# Patient Record
Sex: Female | Born: 1980
Health system: Southern US, Community
[De-identification: ages and names within clinical notes are randomized; demographics above are authoritative.]

## PROBLEM LIST (undated history)

## (undated) DIAGNOSIS — Z9221 Personal history of antineoplastic chemotherapy: Secondary | ICD-10-CM

## (undated) DIAGNOSIS — I10 Essential (primary) hypertension: Secondary | ICD-10-CM

## (undated) DIAGNOSIS — M62830 Muscle spasm of back: Secondary | ICD-10-CM

## (undated) DIAGNOSIS — G43909 Migraine, unspecified, not intractable, without status migrainosus: Secondary | ICD-10-CM

## (undated) DIAGNOSIS — T4145XA Adverse effect of unspecified anesthetic, initial encounter: Secondary | ICD-10-CM

## (undated) DIAGNOSIS — Z8571 Personal history of Hodgkin lymphoma: Secondary | ICD-10-CM

## (undated) DIAGNOSIS — D72819 Decreased white blood cell count, unspecified: Secondary | ICD-10-CM

## (undated) DIAGNOSIS — L309 Dermatitis, unspecified: Secondary | ICD-10-CM

## (undated) DIAGNOSIS — E039 Hypothyroidism, unspecified: Secondary | ICD-10-CM

## (undated) DIAGNOSIS — T8859XA Other complications of anesthesia, initial encounter: Secondary | ICD-10-CM

## (undated) DIAGNOSIS — R05 Cough: Secondary | ICD-10-CM

## (undated) DIAGNOSIS — C8190 Hodgkin lymphoma, unspecified, unspecified site: Secondary | ICD-10-CM

## (undated) DIAGNOSIS — E119 Type 2 diabetes mellitus without complications: Secondary | ICD-10-CM

## (undated) HISTORY — DX: Personal history of antineoplastic chemotherapy: Z92.21

## (undated) HISTORY — DX: Hodgkin lymphoma, unspecified, unspecified site: C81.90

## (undated) HISTORY — DX: Migraine, unspecified, not intractable, without status migrainosus: G43.909

## (undated) HISTORY — PX: DILATION AND CURETTAGE OF UTERUS: SHX78

## (undated) HISTORY — DX: Type 2 diabetes mellitus without complications: E11.9

## (undated) HISTORY — DX: Muscle spasm of back: M62.830

## (undated) HISTORY — DX: Personal history of Hodgkin lymphoma: Z85.71

## (undated) HISTORY — DX: Essential (primary) hypertension: I10

---
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
## 2002-06-05 ENCOUNTER — Emergency Department (HOSPITAL_COMMUNITY): Admission: EM | Admit: 2002-06-05 | Discharge: 2002-06-05 | Payer: Self-pay | Admitting: Emergency Medicine

## 2002-06-05 ENCOUNTER — Encounter: Payer: Self-pay | Admitting: Emergency Medicine

## 2002-08-12 ENCOUNTER — Encounter: Payer: Self-pay | Admitting: Obstetrics and Gynecology

## 2002-08-12 ENCOUNTER — Inpatient Hospital Stay (HOSPITAL_COMMUNITY): Admission: AD | Admit: 2002-08-12 | Discharge: 2002-08-12 | Payer: Self-pay | Admitting: *Deleted

## 2002-08-23 ENCOUNTER — Ambulatory Visit (HOSPITAL_COMMUNITY): Admission: RE | Admit: 2002-08-23 | Discharge: 2002-08-23 | Payer: Self-pay | Admitting: *Deleted

## 2002-08-23 ENCOUNTER — Encounter: Payer: Self-pay | Admitting: *Deleted

## 2002-08-23 ENCOUNTER — Inpatient Hospital Stay (HOSPITAL_COMMUNITY): Admission: AD | Admit: 2002-08-23 | Discharge: 2002-08-23 | Payer: Self-pay | Admitting: Obstetrics

## 2002-08-25 ENCOUNTER — Inpatient Hospital Stay (HOSPITAL_COMMUNITY): Admission: AD | Admit: 2002-08-25 | Discharge: 2002-08-25 | Payer: Self-pay | Admitting: Obstetrics

## 2002-08-27 ENCOUNTER — Inpatient Hospital Stay (HOSPITAL_COMMUNITY): Admission: AD | Admit: 2002-08-27 | Discharge: 2002-08-27 | Payer: Self-pay | Admitting: Obstetrics

## 2002-08-30 ENCOUNTER — Ambulatory Visit (HOSPITAL_COMMUNITY): Admission: RE | Admit: 2002-08-30 | Discharge: 2002-08-30 | Payer: Self-pay | Admitting: *Deleted

## 2002-08-30 ENCOUNTER — Encounter: Payer: Self-pay | Admitting: Obstetrics

## 2002-09-06 ENCOUNTER — Ambulatory Visit (HOSPITAL_COMMUNITY): Admission: RE | Admit: 2002-09-06 | Discharge: 2002-09-06 | Payer: Self-pay | Admitting: Obstetrics

## 2002-09-06 ENCOUNTER — Encounter: Payer: Self-pay | Admitting: Obstetrics

## 2002-09-10 ENCOUNTER — Ambulatory Visit (HOSPITAL_COMMUNITY): Admission: RE | Admit: 2002-09-10 | Discharge: 2002-09-10 | Payer: Self-pay | Admitting: Obstetrics

## 2002-09-10 ENCOUNTER — Encounter (INDEPENDENT_AMBULATORY_CARE_PROVIDER_SITE_OTHER): Payer: Self-pay | Admitting: Specialist

## 2002-09-11 ENCOUNTER — Inpatient Hospital Stay (HOSPITAL_COMMUNITY): Admission: AD | Admit: 2002-09-11 | Discharge: 2002-09-12 | Payer: Self-pay | Admitting: Obstetrics

## 2002-09-11 ENCOUNTER — Encounter: Payer: Self-pay | Admitting: Obstetrics

## 2002-09-12 ENCOUNTER — Inpatient Hospital Stay (HOSPITAL_COMMUNITY): Admission: AD | Admit: 2002-09-12 | Discharge: 2002-09-15 | Payer: Self-pay | Admitting: Obstetrics

## 2003-04-07 ENCOUNTER — Emergency Department (HOSPITAL_COMMUNITY): Admission: EM | Admit: 2003-04-07 | Discharge: 2003-04-07 | Payer: Self-pay | Admitting: Emergency Medicine

## 2003-04-07 ENCOUNTER — Emergency Department (HOSPITAL_COMMUNITY): Admission: EM | Admit: 2003-04-07 | Discharge: 2003-04-08 | Payer: Self-pay | Admitting: Emergency Medicine

## 2003-06-10 ENCOUNTER — Encounter: Payer: Self-pay | Admitting: Emergency Medicine

## 2003-06-10 ENCOUNTER — Emergency Department (HOSPITAL_COMMUNITY): Admission: EM | Admit: 2003-06-10 | Discharge: 2003-06-10 | Payer: Self-pay | Admitting: Emergency Medicine

## 2003-09-29 ENCOUNTER — Emergency Department (HOSPITAL_COMMUNITY): Admission: AD | Admit: 2003-09-29 | Discharge: 2003-09-29 | Payer: Self-pay | Admitting: Family Medicine

## 2003-10-08 ENCOUNTER — Emergency Department (HOSPITAL_COMMUNITY): Admission: EM | Admit: 2003-10-08 | Discharge: 2003-10-08 | Payer: Self-pay | Admitting: Emergency Medicine

## 2003-12-08 ENCOUNTER — Emergency Department (HOSPITAL_COMMUNITY): Admission: EM | Admit: 2003-12-08 | Discharge: 2003-12-08 | Payer: Self-pay | Admitting: Emergency Medicine

## 2004-02-18 ENCOUNTER — Inpatient Hospital Stay (HOSPITAL_COMMUNITY): Admission: AD | Admit: 2004-02-18 | Discharge: 2004-02-18 | Payer: Self-pay | Admitting: Obstetrics & Gynecology

## 2005-04-11 ENCOUNTER — Emergency Department (HOSPITAL_COMMUNITY): Admission: EM | Admit: 2005-04-11 | Discharge: 2005-04-11 | Payer: Self-pay | Admitting: Emergency Medicine

## 2006-06-06 ENCOUNTER — Emergency Department (HOSPITAL_COMMUNITY): Admission: EM | Admit: 2006-06-06 | Discharge: 2006-06-06 | Payer: Self-pay | Admitting: Emergency Medicine

## 2006-07-17 ENCOUNTER — Inpatient Hospital Stay (HOSPITAL_COMMUNITY): Admission: AD | Admit: 2006-07-17 | Discharge: 2006-07-17 | Payer: Self-pay | Admitting: Gynecology

## 2006-07-18 ENCOUNTER — Inpatient Hospital Stay (HOSPITAL_COMMUNITY): Admission: AD | Admit: 2006-07-18 | Discharge: 2006-07-18 | Payer: Self-pay | Admitting: Family Medicine

## 2006-09-22 ENCOUNTER — Other Ambulatory Visit: Admission: RE | Admit: 2006-09-22 | Discharge: 2006-09-22 | Payer: Self-pay | Admitting: Obstetrics and Gynecology

## 2006-09-27 ENCOUNTER — Ambulatory Visit (HOSPITAL_COMMUNITY): Admission: RE | Admit: 2006-09-27 | Discharge: 2006-09-27 | Payer: Self-pay | Admitting: Obstetrics and Gynecology

## 2006-10-06 ENCOUNTER — Ambulatory Visit (HOSPITAL_COMMUNITY): Admission: RE | Admit: 2006-10-06 | Discharge: 2006-10-06 | Payer: Self-pay | Admitting: Obstetrics and Gynecology

## 2006-10-13 ENCOUNTER — Emergency Department (HOSPITAL_COMMUNITY): Admission: EM | Admit: 2006-10-13 | Discharge: 2006-10-13 | Payer: Self-pay | Admitting: Emergency Medicine

## 2007-02-19 ENCOUNTER — Emergency Department (HOSPITAL_COMMUNITY): Admission: EM | Admit: 2007-02-19 | Discharge: 2007-02-19 | Payer: Self-pay | Admitting: Emergency Medicine

## 2007-03-13 ENCOUNTER — Inpatient Hospital Stay (HOSPITAL_COMMUNITY): Admission: AD | Admit: 2007-03-13 | Discharge: 2007-03-13 | Payer: Self-pay | Admitting: Obstetrics and Gynecology

## 2007-05-16 ENCOUNTER — Inpatient Hospital Stay (HOSPITAL_COMMUNITY): Admission: AD | Admit: 2007-05-16 | Discharge: 2007-05-16 | Payer: Self-pay | Admitting: Obstetrics and Gynecology

## 2007-06-20 ENCOUNTER — Inpatient Hospital Stay (HOSPITAL_COMMUNITY): Admission: AD | Admit: 2007-06-20 | Discharge: 2007-06-20 | Payer: Self-pay | Admitting: Obstetrics and Gynecology

## 2007-10-08 ENCOUNTER — Observation Stay (HOSPITAL_COMMUNITY): Admission: AD | Admit: 2007-10-08 | Discharge: 2007-10-09 | Payer: Self-pay | Admitting: Obstetrics and Gynecology

## 2007-10-11 ENCOUNTER — Inpatient Hospital Stay (HOSPITAL_COMMUNITY): Admission: AD | Admit: 2007-10-11 | Discharge: 2007-10-17 | Payer: Self-pay | Admitting: Obstetrics and Gynecology

## 2007-10-12 ENCOUNTER — Encounter (INDEPENDENT_AMBULATORY_CARE_PROVIDER_SITE_OTHER): Payer: Self-pay | Admitting: Obstetrics and Gynecology

## 2009-02-09 ENCOUNTER — Emergency Department (HOSPITAL_COMMUNITY): Admission: EM | Admit: 2009-02-09 | Discharge: 2009-02-09 | Payer: Self-pay | Admitting: Emergency Medicine

## 2009-06-03 ENCOUNTER — Inpatient Hospital Stay (HOSPITAL_COMMUNITY): Admission: AD | Admit: 2009-06-03 | Discharge: 2009-06-04 | Payer: Self-pay | Admitting: Obstetrics and Gynecology

## 2009-11-18 ENCOUNTER — Emergency Department (HOSPITAL_COMMUNITY): Admission: EM | Admit: 2009-11-18 | Discharge: 2009-11-18 | Payer: Self-pay | Admitting: Emergency Medicine

## 2011-02-28 LAB — URINALYSIS, ROUTINE W REFLEX MICROSCOPIC
Bilirubin Urine: NEGATIVE
Hgb urine dipstick: NEGATIVE
Ketones, ur: NEGATIVE mg/dL
Specific Gravity, Urine: 1.026 (ref 1.005–1.030)
pH: 6 (ref 5.0–8.0)

## 2011-02-28 LAB — CBC
HCT: 39.9 % (ref 36.0–46.0)
Hemoglobin: 13.4 g/dL (ref 12.0–15.0)
MCV: 92.2 fL (ref 78.0–100.0)
Platelets: 246 10*3/uL (ref 150–400)
RDW: 12.9 % (ref 11.5–15.5)

## 2011-02-28 LAB — POCT I-STAT, CHEM 8
BUN: 13 mg/dL (ref 6–23)
Calcium, Ion: 1.07 mmol/L — ABNORMAL LOW (ref 1.12–1.32)
Chloride: 102 mEq/L (ref 96–112)
Creatinine, Ser: 0.7 mg/dL (ref 0.4–1.2)
Glucose, Bld: 130 mg/dL — ABNORMAL HIGH (ref 70–99)
HCT: 42 % (ref 36.0–46.0)
Potassium: 4.3 mEq/L (ref 3.5–5.1)

## 2011-02-28 LAB — DIFFERENTIAL
Basophils Absolute: 0 10*3/uL (ref 0.0–0.1)
Eosinophils Absolute: 0.1 10*3/uL (ref 0.0–0.7)
Eosinophils Relative: 1 % (ref 0–5)
Lymphocytes Relative: 13 % (ref 12–46)
Monocytes Absolute: 0.4 10*3/uL (ref 0.1–1.0)

## 2011-02-28 LAB — PREGNANCY, URINE: Preg Test, Ur: NEGATIVE

## 2011-03-06 LAB — CBC
HCT: 38.2 % (ref 36.0–46.0)
Hemoglobin: 13.3 g/dL (ref 12.0–15.0)
MCHC: 34.8 g/dL (ref 30.0–36.0)
MCV: 93.9 fL (ref 78.0–100.0)
RBC: 4.06 MIL/uL (ref 3.87–5.11)
WBC: 5.2 10*3/uL (ref 4.0–10.5)

## 2011-03-06 LAB — URINALYSIS, ROUTINE W REFLEX MICROSCOPIC
Bilirubin Urine: NEGATIVE
Glucose, UA: NEGATIVE mg/dL
Specific Gravity, Urine: 1.02 (ref 1.005–1.030)

## 2011-03-06 LAB — WET PREP, GENITAL
Trich, Wet Prep: NONE SEEN
Yeast Wet Prep HPF POC: NONE SEEN

## 2011-03-06 LAB — POCT PREGNANCY, URINE: Preg Test, Ur: NEGATIVE

## 2011-03-06 LAB — URINE MICROSCOPIC-ADD ON

## 2011-03-06 LAB — COMPREHENSIVE METABOLIC PANEL
CO2: 28 mEq/L (ref 19–32)
Calcium: 9.2 mg/dL (ref 8.4–10.5)
Chloride: 102 mEq/L (ref 96–112)
Creatinine, Ser: 0.81 mg/dL (ref 0.4–1.2)
GFR calc non Af Amer: >60 mL/min (ref >60)
Glucose, Bld: 90 mg/dL (ref 70–99)
Total Bilirubin: 0.5 mg/dL (ref 0.3–1.2)

## 2011-03-06 LAB — GC/CHLAMYDIA PROBE AMP, GENITAL: GC Probe Amp, Genital: NEGATIVE

## 2011-04-12 NOTE — Discharge Summary (Signed)
[name], [name]           ACCOUNT NO.:  [id number]   MEDICAL RECORD NO.:  [id number]          PATIENT TYPE:  INP   LOCATION:  9309                          [facility]ILITY[facility]  [facility]   PHYSICIAN:  Charles A. Delcambre, MDDATE OF BIRTH:  [birth year]   DATE OF ADMISSION:  10/11/2007  DATE OF DISCHARGE:  10/17/2007                               DISCHARGE SUMMARY   DISCHARGE DIAGNOSES:  1. Chronic hypertension augmented with pregnancy.  2. Failed induction versus cephalopelvic disproportion.  3. Fetal intolerance of labor.  4. Gestational diabetes, insulin-requiring.   PROCEDURE:  Primary low transverse cesarean section.   Baby was a vigorous female, Apgars 5 and 7, 4 pounds 6 ounces.  Placenta  to pathology.  Path pending at this time.  History and physical is  dictated and on the chart.   LABORATORY:  Last CMET consistent with normal SGOT and PT.  Uric acid  was 6.4.  CBC was 14.2 white count, hemoglobin 9.6, hematocrit 28.4,  platelets 223.   DISPOSITION:  The patient was discharged home to follow-up in the office  in 2 weeks.  She was given convalescence and instructions to notify of  any temperature greater than 100 degrees, erythema or drainage about the  wound, increased pain or bleeding.  She will not lift more than about 20-  25 pounds for 4 weeks.  She is precautioned regarding PIH symptoms as  well as routine symptoms for postoperative C-section.  No driving for 2  weeks.  Shower okay for 2 weeks.  Diet as tolerated.  She was given prescriptions of Procardia XL 60 mg once a day, Percocet  5/325 one to two p.o. q.4h. p.r.n. #40, Motrin 800 mg one p.o. q.8h.  p.r.n. #30 refill times one and Tandem one p.o. daily #30 refill times  one.   HOSPITAL COURSE:  The patient was admitted and was to undergo Cervidil  induction was contracting too much overnight.  She never got past  fingertip and then developed periods of successive 4-5 light  decelerations.  Reactivity did remain.   Cervix was high and closed.  Blood pressure 150/87.  Aldomet had just been given.  With late decelerations noted frequently  recommended we proceed with cesarean section and this was done without  difficulty.  Placenta was taken to pathology.  Findings were noted above. The baby did end up going to the NICU.  Not  clear what the reasoning was but poor feeding had been worked on.  Antibiotics were given briefly and the baby has been stable.  It is  unclear when the baby will go home with the patient getting different  answers when she asks.   On postop day #1 she was given clear liquids and tolerated these.  Laboratories are noted above.  In the morning creatinine 27.6.  Fasting  blood sugar was 91.  The 9:00 p.m. blood sugar the evening before was  111.  She was continued on Aldomet and Labetalol was 200 mg b.i.d.  On  postop day #2 we discontinued the Aldomet and started Procardia XL 30  mg.  Decreasing the labetalol to 100 mg b.i.d.  On postop day 3 she  continued to do well.  The CBC with platelets 223, hematocrit 28.4.  She  was once again had the Procardia increased to 60 mg and labetalol was  discontinued.  On postop day #4 she continued to have blood pressures in  the 150s/90s and we elected to watch  her one more day on Procardia XL 60 mg.  On postop day #5 blood  pressures were 148/91.  Had been 130s-150s/80's-90s in the last 24  hours.  Incision was intact.  She was therefore discharged home after  staples were removed on Procardia XL 60 mg once a day, otherwise as  noted above.      Charles A. Sydnee Cabal, MD  Electronically Signed     CAD/MEDQ  D:  10/17/2007  T:  10/17/2007  Job:  [id number]

## 2011-04-12 NOTE — H&P (Signed)
[name], [name]           ACCOUNT NO.:  [id number]   MEDICAL RECORD NO.:  [id number]          PATIENT TYPE:  OBV   LOCATION:  9154                          [facility]  [facility]   PHYSICIAN:  Charles A. Delcambre, MDDATE OF BIRTH:  1981/03/01   DATE OF ADMISSION:  10/08/2007  DATE OF DISCHARGE:                              HISTORY & PHYSICAL   This patient is 34 weeks and 5 days, with pregnancy complicated by  chronic hypertension and gestational diabetes requiring insulin.  She is  on 20 units of NPH q. morning and sugars have been pretty well  controlled in the last several days. Fasting's have been in the 70 to 80  range.  Two hour postprandial's  110, occasionally 120.  After breakfast  low teens to 120's for lunch and same for the 9 p.m. or after dinner,  low teens and one 126.  She is on two antihypertensive medications that  are marginally effective with blood pressure as noted below today.  She  is admitted because of biophysical profile 6 out of 10, barely making 6  with breathing after about 30 minutes.  She notes active fetal movement  a great deal at night and then later in the day but not in the morning  and this BPP was done in the morning.  She has a very reactive NST,  counter to the BPP results. A 24 hour urine has been done and on  November 4 was 126.4.   PAST MEDICAL HISTORY:  Chronic hypertension.   PAST SURGICAL HISTORY:  DNE x1.  SAB x1.   MEDICATIONS:  1. NPH Humulin 20 units q.a.m.  2. Labetalol 200 mg b.i.d.  3. Aldomet 500 mg x4 daily.   ALLERGIES:  PENICILLIN.   A 24 hour urine protein on July 26, 2007 was 175 mg so she is somewhat  improved at this time.  AFI today was 11.7.   OB LABS:  Blood type A positive, Rh antibody screen negative, sickle  cell trait negative, VDRL nonreactive, rubella immune, hepatitis B  surface antigen negative, HIV nonreactive, TSH normal. Urinalysis  negative.  Pap negative in October, 2007.  GC/Chlamydia negative  on Apr 20, 2007.  Cystic fibrosis declined.  Triple screen declined, first  trimester screen declined. One hour Glucola 199, 3 hour Glucola fasting  90, 1 hour 200, 2 hour 229, 3 hour 148. She saw Ina Kick and was  started on 15 of NPH in the morning per her instructions.  Hemoglobin at  28 weeks was 11.1. RPR negative at 28 weeks.   PHYSICAL EXAMINATION:  GENERAL:  Alert and oriented x3.  Denies  pregnancy induced hypertension symptoms or any symptoms of blood  pressure elevation. Blood pressure 158/92, trace protein in the urine,  weight 249 pounds. [facility] November 14, 2007 by LMP, confirmed with 6-weeks  and 2-day ultrasounds putting her at  34 weeks and 5 days.  HEENT EXAM:  Grossly within normal limits.  NECK:  Supple.  BREASTS:  No masses, tenderness, discharge.  No lumps.  ABDOMEN:  Gravid, fundal height 35.  LUNGS:  Clear bilaterally.  HEART:  Regular rate and rhythm, 2/6 systolic ejection murmur in the  left sternal border.  PELVIC EXAM:  Cervix is not checked. She gives no history of rupture of  membranes, leaking or discharge.  EXTREMITIES:  Minimal edema bilaterally.   ASSESSMENT:  1. Chronic hypertension.  2. Gestational diabetes, insulin requiring.  3. Biophysical 6 out of 10 today at the office.   PLAN:  Admit with continuous monitoring overnight.  Tocolysis if needed.  General diet as far as a regular AMA type of diet.  PIH panel. C-MET and  CBC to be drawn.  Group B strep will be done. Bed rest with bathroom  privileges.  Continue 20 units NPH q.a.m., fasting 2 hour postprandial's  9 p.m. glucose's. Continue Labetalol 200 mg b.i.d. and Aldomet 500 mg x4  daily. Biophysical to be repeated in the morning after breakfast and we  will go on from there.      Charles A. Sydnee Cabal, MD  Electronically Signed     CAD/MEDQ  D:  10/08/2007  T:  10/08/2007  Job:  [id number]

## 2011-04-12 NOTE — H&P (Signed)
[name], [name]           ACCOUNT NO.:  [id number]   MEDICAL RECORD NO.:  [id number]          PATIENT TYPE:  INP   LOCATION:  9172                          [facility]   PHYSICIAN:  Charles A. Delcambre, MDDATE OF BIRTH:  [birth year]   DATE OF ADMISSION:  10/11/2007  DATE OF DISCHARGE:                              HISTORY & PHYSICAL   This patient is admitted now at 35 weeks, 1 day, to undergo cervical  induction secondary to diabetes and chronic hypertension.  But, on  monitoring today, she did have two late decelerations, possibly a third  in about 1 1/2 hours of monitoring.  She had a biophysical profile of  10/10 yesterday, but the day before biophysical profile was 6/10.  I  feel that it is time to go ahead and get this baby out, and she  understands the risks of prematurity.   PAST MEDICAL HISTORY:  Chronic hypertension.   PAST SURGICAL HISTORY:  D and E x1.   MEDICATION:  1. NPH  Humulin 20 units q.a.m.  2. Labetalol 200 mg b.i.d.  3. Aldomet 500 mg q.i.d.   ALLERGIES:  PENICILLIN.   PRENATAL LABS:  Blood type A+.  Antibody screen negative.  Sickle cell  trait negative.  VDRL nonreactive.  Rubella immune, hepatitis C surface  antigen negative.  HIV nonreactive.  TSH normal.  Urinalysis negative.  PAP negative October 2007.  GC Chlamydia in May negative.  Cystic  fibrosis  declined. Triple screen declined.  First trimester screen did  decline.  One hour Glucola 199.  Three hour Glucola fasting 90, one hour  200, two hour 229, three hour 148.  She saw Ina Kick and was  started on 15 of NPH units of NPH in the morning per my instructions.  Hemoglobin at 28 weeks was 11.1.  She had Group B Strep drawn done two  days ago at the hospital, results pending.  Denies any PIH sx of  pregnancy, hypertension symptom, scotomata or right upper quadrant pain,  blurred vision.  No symptoms of blood pressure elevation.  Blood  pressure today 140/92.  Trace protein in the  urine.  A 24-hour urine  showed 175 mg of protein.   PHYSICAL EXAMINATION:  GENERAL:  Alert and oriented x3.  VITAL SIGNS:  Blood pressure 140/92.  HEENT:  Exam grossly normal.  NECK:  Supple.  No thyromegaly.  LUNGS:  Clear bilaterally.  HEART:  Regular rate and rhythm.  A 2/6 systolic ejection murmur at the  left sternal border.  ABDOMEN:  Gravid.  Fundal height 36.  BREAST:  No masses, no tenderness, no discharge.  SKIN:  There are no changes.  PELVIC EXAM:  Cervix is posterior -2, 50-70% effaced, soft and less than  1 cm.  EXTREMITIES:  Minimal edema bilaterally.   ASSESSMENT:  1. Chronic hypertension.  2. Gestational diabetes, insulin requiring.  3. Suspicious NST.   PLAN:  1. Admit for cervical induction.  2. ADA diet tonight if strip looks okay.  3. PIH labs were drawn two days ago and were negative.  4. Check Group B Strep results.  5.  Treat as indicated.  6. She takes her insulin in the morning.  We will hold off in the      morning.  7. Check morning fasting sugar, and then she how active she is going      to be, and possibly give her some regular insulin if needed, if not      in active labor.  In active labor, if the morning sugars are      elevated, we will go ahead and put her on the Glucomander.  8. Continue Labetalol 200 mg b.i.d.  9. Aldomet 500 mg four times daily.      Charles A. Sydnee Cabal, MD  Electronically Signed     CAD/MEDQ  D:  10/11/2007  T:  10/12/2007  Job:  [id number]

## 2011-04-12 NOTE — Op Note (Signed)
NAMESALIHAH, PECKHAM           ACCOUNT NO.:  [id number]   MEDICAL RECORD NO.:  [id number]          PATIENT TYPE:  INP   LOCATION:  9160                          [facility]   PHYSICIAN:  Charles A. Delcambre, MDDATE OF BIRTH:  1981-03-04   DATE OF PROCEDURE:  10/11/2007  DATE OF DISCHARGE:                               OPERATIVE REPORT   This patient did develop fetal intolerance of labor with any Pitocin  dosage, was contracting too much for Cervidil to be placed, and she did  not have recurrent decelerations, but developed frequent late  decelerations. For this reason, she was counseled to go to operative  delivery.  She gave informed consent as noted in the chart.  She was  taken to the operating room and placed in the supine position.  Spinal  was given and was adequate.  A sterile prep and drape was undertaken.  A  Pfannenstiel incision was made with the knife and carried down to the  fascia.  The fascia was incised with a knife and Mayo scissors.  The  rectus sheath was released superiorly and inferiorly.  The rectus  muscles were sharply dissected in the midline.  The peritoneum was  entered with Metzenbaum scissors without damage to bowel, bladder or  vascular structures.  The peritoneum was stretched to accommodate an  Teacher, early years/pre with no bowel under the retractor.  The retractor was  cinched down.  The vesicouterine peritoneum was incised with Metzenbaum  scissors.  Blunt dissection was used develop a bladder flap.  A lower  uterine segment transverse incision was made with the knife and carried  down to an amniotomy without damage to the infant.  Vertical traction  was used to extend the incision.  A hand was inserted, the occiput was  lifted up to the incision site.  Fundal pressure was applied by the  operator's assistant and the infant was delivered without difficulty.  The body cord was reduced.  The infant was vigorous, cut free, cord gas  was sent to the  lab.  The placenta was manually expressed and given to  the cord blood team to go to pathology.  Internal surface of the uterus  was wiped with a moistened lap. The uterus was then closed in two  layers, first a #1 chromic running locking, second #1 chromic running  imbricating over, one figure-of-eight suture #1 chromic was used to  achieve hemostasis.  Bladder flap hemostasis was good. Subfascial  hemostasis was good.  The fascia was closed with #1 Vicryl.  Subcutaneous irrigation was carried out.  Minor electrocautery was  obtained.  Sterile skin clips were then applied to close the skin.  A  sterile dressing was applied.  The patient tolerated the procedure well  and will go back to labor and delivery because AICU bed is not  available. Will monitor urine output and watch for elevation of blood  pressure.      Charles A. Sydnee Cabal, MD  Electronically Signed     CAD/MEDQ  D:  10/12/2007  T:  10/13/2007  Job:  [id number]

## 2011-04-15 NOTE — Op Note (Signed)
   [name], PASCAL                     ACCOUNT NO.:  [id number]   MEDICAL RECORD NO.:  [id number]                   PATIENT TYPE:  AMB   LOCATION:  SDC                                  [facility]   PHYSICIAN:  Kathreen Cosier, M.D.           DATE OF BIRTH:  10-16-1981   DATE OF PROCEDURE:  09/10/2002  DATE OF DISCHARGE:                                 OPERATIVE REPORT   PREOPERATIVE DIAGNOSES:  Intrauterine fetal demise 6-8 weeks.   ANESTHESIA:  MAC.   PROCEDURE:  The patient placed in lithotomy position.  Perineum and vagina  prepped and draped.  Bladder emptied with straight catheter.  Uterus 6-8  weeks size.  Weighted speculum placed in vagina.  Cervix injected at 3, 9,  12 o'clock with 1% Xylocaine for a total of 8 cc.  The anterior lip of the  cervix grasped with tenaculum.  Endometrial cavity sounded 9 cm.  Cervix  dilated number 27 Pratt and a number 8 suction used to aspirate the uterine  contents.  The cavity was curetted until clean.  The patient tolerated  procedure well.  Taken to recovery room in good condition.                                               Kathreen Cosier, M.D.    BAM/MEDQ  D:  09/10/2002  T:  09/10/2002  Job:  [id number]

## 2011-04-15 NOTE — H&P (Signed)
[name], [name]           ACCOUNT NO.:  [id number]   MEDICAL RECORD NO.:  [id number]          PATIENT TYPE:  AMB   LOCATION:  SDC                           [facility]   PHYSICIAN:  Charles A. Delcambre, MDDATE OF BIRTH:  Jun 05, 1981   DATE OF ADMISSION:  09/28/2006  DATE OF DISCHARGE:                                HISTORY & PHYSICAL   CHIEF COMPLAINT:  An 8 week and 2 day fetal demise.   HISTORY OF PRESENT ILLNESS:  A 30 year old, para 0-0-1-0, previous  spontaneous abortion with D&C done, complicated by 4 days afterwards being  hospitalized for fever and admitted the evening of D&C.  Unclear etiology of  the fever, not having a repeat a D&C and not having any other surgical  intervention.  This patient was unaware that she was pregnant.  Has history  of only 2 periods in the last year, is amenorrheic, and upon larger workup  was noted to be pregnant last week.  Ultrasound today to determine  gestational age yielded 8 week 2 day gestation but, unfortunately, fetal  demise.  She has been counseled for spontaneous miscarriage option, elective  management versus dilation and evacuation.  She elects dilation and  evacuation, and this is scheduled for September 28, 2006.  She gives informed  consents and accepts risks of infection, bleeding, bowel or bladder damage,  blood product risk including hepatitis and HIV exposure, retained products  with second D&C, gross anesthesia risks as well as uterine perforation risk.  All questions were answered.  She gives informed consent in this regard, and  we will proceed as outlined below.   PAST MEDICAL HISTORY:  Hypertension.   PAST SURGICAL HISTORY:  D&C.   MEDICATIONS:  None.   ALLERGIES:  PENICILLIN, unknown reaction.   SOCIAL HISTORY:  Denies tobacco, ethanol, drug use, or STD exposure in the  past.  She is not married but sexually active, no monogamously and does use  condoms over time now after gonorrhea was diagnosed  several months ago.   FAMILY HISTORY:  No major illnesses other than mother having hypertension  and obesity, grandmother with hypertension as well. Denies family history of  breast, uterus, ovary, cervix, colon cancer, lymphoma, coronary artery  disease, stroke, diabetes.   REVIEW OF SYSTEMS:  She has problems with only 2 periods per year.  Denies  fever, chills, new rashes, lesion, headaches, dizziness, seasonal allergies,  chest pain, shortness of breath, wheezing, diarrhea, constipation, bleeding,  melena, hematochezia, urgency, frequency, dysuria, incontinence, hematuria,  or emotional changes.   PHYSICAL EXAMINATION:  GENERAL: Alert and oriented x3, no distress.  VITAL SIGNS:  Blood pressure 120/80, heart rate 88, respirations 16. Weight  227 pounds.  HEENT:  Exam grossly within normal limits.  NECK: Supple without thyromegaly or adenopathy.  LUNGS: Clear bilaterally.  HEART: Regular rate and rhythm without murmur, rub, or gallop.  BREASTS: No masses, tenderness, discharge, skin changes bilaterally.  ABDOMEN:  Moderate obesity is present.  Soft, nontender, nondistended. No  hepatosplenomegaly or other masses noted.  No hernia.  PELVIC: Normal external female genitalia.  Bartholin, urethral, Skene  normal.  Vault without discharge or lesions.  Urethral meatus normal.  Urethra normal.  Bladder normal.  Vagina normal.  Nulliparous cervix is  noted.  No cervical motion tenderness is present.  Uterus somewhat difficult  to palpate secondary to patient's body habitus, not enlarged, nontender.  Adnexa nontender without masses bilaterally.  I do not directly palpate the  ovaries secondary to patient's body habitus.   ASSESSMENT:  Fetal demise at 8 weeks and 2 days.   PLAN:  1. Dilation and evacuation as noted above.  2. Preoperative CBC and type and Rh.  3. N.P.O. past midnight.   All questions were answered.  She gives informed consent.  We will proceed  as outlined.       Charles A. Sydnee Cabal, MD  Electronically Signed     CAD/MEDQ  D:  09/27/2006  T:  09/27/2006  Job:  [id number]

## 2011-04-15 NOTE — H&P (Signed)
NAMEELAURA, CALIX           ACCOUNT NO.:  [id number]   MEDICAL RECORD NO.:  [id number]          PATIENT TYPE:  OUT   LOCATION:  ULT                           [facility]   PHYSICIAN:  Charles A. Delcambre, MDDATE OF BIRTH:  [birth year]   DATE OF ADMISSION:  09/27/2006  DATE OF DISCHARGE:                                HISTORY & PHYSICAL   ADDENDUM:  This patient was to be admitted on September 28, 2006, but this has  been rescheduled to October 05, 2006 secondary to a gonorrhea infection that  was discovered on September 27, 2006.  She has been treated with Rocephin 125  mg IM on September 28, 2006, and we will delay D&E for one week secondary to  this infection.  We will proceed on with D&E as dictated on previous history  and physical, and she will remain n.p.o. past midnight with surgery  scheduled for 2 p.m., as I understand it, on October 05, 2006.      Charles A. Sydnee Cabal, MD  Electronically Signed     CAD/MEDQ  D:  09/28/2006  T:  09/28/2006  Job:  [id number]

## 2011-04-15 NOTE — Discharge Summary (Signed)
   NAMEANGELIC, [name]                     ACCOUNT NO.:  [id number]   MEDICAL RECORD NO.:  [id number]                   PATIENT TYPE:  INP   LOCATION:  9325                                 [facility]   PHYSICIAN:  Kathreen Cosier, M.D.           DATE OF BIRTH:  08/20/1981   DATE OF ADMISSION:  09/12/2002  DATE OF DISCHARGE:  09/15/2002                                 DISCHARGE SUMMARY   HISTORY OF PRESENT ILLNESS:  The patient is an 30 year old gravida 1 who had  IUFD at six weeks and had an D&E the prior Tuesday to admission.  Came back  to the hospital on Wednesday, said she had fever up to 102 at home.  She was  kept for observation and she was afebrile.  Ultrasound showed an empty  uterus.  Her lochia was normal.  She had been discharged on antibiotics but  did not take them, so she went home without problems.  The patient now  complains of 102 temperature at home.  Also complains of some depression.  On admission she was afebrile, lochia normal, abdomen normal.  Hemoglobin  12.1, white count 14.5.   HOSPITAL COURSE:  The patient was admitted for observation.  She had a small  amount of brown discharge, not foul smelling.  She remained afebrile while  hospitalized.  She was treated with IV Cleocin 900 q.8h. and then on September 14, 2002 was changed to Cleocin 300 mg p.o. q.6h.  For her depression she  was given Zoloft 50 mg q.d. and seen by comfort referral.  She was  discharged on September 15, 2002, afebrile since admission.   DISCHARGE MEDICATIONS:  1. Cleocin 300 mg q.6h. x10 days.  2. Zoloft 50 mg q.d.   DISPOSITION:  The patient states that she felt much better and she was  discharged home, to see me in two weeks.   DISCHARGE DIAGNOSES:  1. Status post depression post fetal loss.  2. Possible endometritis.                                               Kathreen Cosier, M.D.    BAM/MEDQ  D:  10/09/2002  T:  10/09/2002  Job:  [id number]

## 2011-09-06 LAB — COMPREHENSIVE METABOLIC PANEL
ALT: 27
AST: 29
AST: 34
AST: 21
Albumin: 2.3 — ABNORMAL LOW
Alkaline Phosphatase: 324 — ABNORMAL HIGH
Alkaline Phosphatase: 598 — ABNORMAL HIGH
BUN: 9
BUN: 6
CO2: 24
CO2: 27
CO2: 21
Calcium: 8.4
Calcium: 8.8
Calcium: 9
Chloride: 104
Chloride: 106
Creatinine, Ser: 0.68
Creatinine, Ser: 0.94
Creatinine, Ser: 0.67
Creatinine, Ser: 0.68
GFR calc Af Amer: >60
GFR calc Af Amer: >60
GFR calc Af Amer: >60
GFR calc non Af Amer: >60
GFR calc non Af Amer: >60
GFR calc non Af Amer: >60
Glucose, Bld: 61 — ABNORMAL LOW
Glucose, Bld: 75
Potassium: 3.7
Sodium: 137
Total Bilirubin: 0.5
Total Bilirubin: 0.7
Total Protein: 6
Total Protein: 6.8

## 2011-09-06 LAB — CBC
HCT: 27.6 — ABNORMAL LOW
Hemoglobin: 11.2 — ABNORMAL LOW
MCHC: 34.6
MCHC: 34.2
MCHC: 34.5
MCV: 91
MCV: 90.6
MCV: 91.4
Platelets: 203
Platelets: 223
Platelets: 218
RBC: 3.04 — ABNORMAL LOW
RBC: 3.05 — ABNORMAL LOW
RBC: 3.52 — ABNORMAL LOW
RBC: 3.87
RDW: 13.2
RDW: 13.9
WBC: 14.2 — ABNORMAL HIGH
WBC: 15.8 — ABNORMAL HIGH

## 2011-09-06 LAB — PROTEIN, URINE, 24 HOUR: Collection Interval-UPROT: 24

## 2011-09-06 LAB — CREATININE CLEARANCE, URINE, 24 HOUR
Creatinine Clearance: 193 — ABNORMAL HIGH
Creatinine, 24H Ur: 1866 — ABNORMAL HIGH
Creatinine: 0.67
Urine Total Volume-CRCL: 800

## 2011-09-06 LAB — LACTATE DEHYDROGENASE
LDH: 210
LDH: 198
LDH: 249

## 2011-09-06 LAB — URIC ACID
Uric Acid, Serum: 6.9
Uric Acid, Serum: 5.9

## 2011-09-06 LAB — GLUCOSE, RANDOM: Glucose, Bld: 60 — ABNORMAL LOW

## 2011-09-06 LAB — STREP B DNA PROBE

## 2011-09-08 ENCOUNTER — Emergency Department (HOSPITAL_COMMUNITY)
Admission: EM | Admit: 2011-09-08 | Discharge: 2011-09-08 | Disposition: A | Discharge status: Home / Self Care | Payer: Medicaid Other | Attending: Emergency Medicine | Admitting: Emergency Medicine

## 2011-09-08 DIAGNOSIS — R7309 Other abnormal glucose: Secondary | ICD-10-CM | POA: Insufficient documentation

## 2011-09-08 DIAGNOSIS — I10 Essential (primary) hypertension: Secondary | ICD-10-CM | POA: Insufficient documentation

## 2011-09-08 DIAGNOSIS — M545 Low back pain, unspecified: Secondary | ICD-10-CM | POA: Insufficient documentation

## 2011-09-08 DIAGNOSIS — M7989 Other specified soft tissue disorders: Secondary | ICD-10-CM | POA: Insufficient documentation

## 2011-09-08 LAB — URINE MICROSCOPIC-ADD ON

## 2011-09-08 LAB — URINALYSIS, ROUTINE W REFLEX MICROSCOPIC
Glucose, UA: >1000 mg/dL — AB
Leukocytes, UA: NEGATIVE
Nitrite: NEGATIVE
Protein, ur: NEGATIVE mg/dL
pH: 6 (ref 5.0–8.0)

## 2011-09-08 LAB — POCT I-STAT, CHEM 8
Calcium, Ion: 1.2 mmol/L (ref 1.12–1.32)
Chloride: 98 mEq/L (ref 96–112)
Creatinine, Ser: 0.7 mg/dL (ref 0.50–1.10)
Glucose, Bld: 316 mg/dL — ABNORMAL HIGH (ref 70–99)
HCT: 41 % (ref 36.0–46.0)

## 2011-09-11 ENCOUNTER — Emergency Department (HOSPITAL_COMMUNITY)
Admission: EM | Admit: 2011-09-11 | Discharge: 2011-09-12 | Disposition: A | Discharge status: Home / Self Care | Payer: Medicaid Other | Attending: Emergency Medicine | Admitting: Emergency Medicine

## 2011-09-11 DIAGNOSIS — E119 Type 2 diabetes mellitus without complications: Secondary | ICD-10-CM | POA: Insufficient documentation

## 2011-09-11 DIAGNOSIS — T383X5A Adverse effect of insulin and oral hypoglycemic [antidiabetic] drugs, initial encounter: Secondary | ICD-10-CM | POA: Insufficient documentation

## 2011-09-11 DIAGNOSIS — R112 Nausea with vomiting, unspecified: Secondary | ICD-10-CM | POA: Insufficient documentation

## 2011-09-11 DIAGNOSIS — I10 Essential (primary) hypertension: Secondary | ICD-10-CM | POA: Insufficient documentation

## 2011-09-11 LAB — GLUCOSE, CAPILLARY

## 2011-09-12 LAB — URINALYSIS, ROUTINE W REFLEX MICROSCOPIC
Bilirubin Urine: NEGATIVE
Glucose, UA: 100 mg/dL — AB
Glucose, UA: NEGATIVE
Hgb urine dipstick: NEGATIVE
Hgb urine dipstick: NEGATIVE
Protein, ur: NEGATIVE mg/dL
Protein, ur: NEGATIVE
Specific Gravity, Urine: 1.019 (ref 1.005–1.030)
Urobilinogen, UA: 0.2 mg/dL (ref 0.0–1.0)
pH: 6.5

## 2011-09-12 LAB — COMPREHENSIVE METABOLIC PANEL
ALT: 14 U/L (ref 0–35)
Alkaline Phosphatase: 100 U/L (ref 39–117)
BUN: 8 mg/dL (ref 6–23)
CO2: 21 mEq/L (ref 19–32)
Chloride: 96 mEq/L (ref 96–112)
GFR calc Af Amer: >90 mL/min (ref >90)
Glucose, Bld: 245 mg/dL — ABNORMAL HIGH (ref 70–99)
Potassium: 3.7 mEq/L (ref 3.5–5.1)
Sodium: 130 mEq/L — ABNORMAL LOW (ref 135–145)
Total Bilirubin: 0.4 mg/dL (ref 0.3–1.2)

## 2011-09-12 LAB — CBC
HCT: 31.9 — ABNORMAL LOW
HCT: 38.4 % (ref 36.0–46.0)
Hemoglobin: 11.2 — ABNORMAL LOW
Hemoglobin: 13.7 g/dL (ref 12.0–15.0)
MCV: 92.5
Platelets: 220
RBC: 4.38 MIL/uL (ref 3.87–5.11)
WBC: 10.1
WBC: 6.4 10*3/uL (ref 4.0–10.5)

## 2011-09-12 LAB — DIFFERENTIAL
Basophils Absolute: 0 10*3/uL (ref 0.0–0.1)
Basophils Relative: 0 % (ref 0–1)
Lymphocytes Relative: 20 % (ref 12–46)
Monocytes Absolute: 0.9 10*3/uL (ref 0.1–1.0)
Neutro Abs: 4.2 10*3/uL (ref 1.7–7.7)
Neutrophils Relative %: 66 % (ref 43–77)

## 2011-09-12 LAB — LIPASE, BLOOD: Lipase: 29 U/L (ref 11–59)

## 2011-09-14 LAB — URINALYSIS, ROUTINE W REFLEX MICROSCOPIC
Bilirubin Urine: NEGATIVE
Glucose, UA: NEGATIVE
Hgb urine dipstick: NEGATIVE
Ketones, ur: 40 — AB
Specific Gravity, Urine: 1.02
pH: 6.5

## 2011-11-20 ENCOUNTER — Inpatient Hospital Stay (HOSPITAL_COMMUNITY)
Admission: AD | Admit: 2011-11-20 | Discharge: 2011-11-20 | Disposition: A | Discharge status: Home / Self Care | Payer: Medicaid Other | Source: Ambulatory Visit | Attending: Obstetrics & Gynecology | Admitting: Obstetrics & Gynecology

## 2011-11-20 ENCOUNTER — Encounter (HOSPITAL_COMMUNITY): Payer: Self-pay | Admitting: *Deleted

## 2011-11-20 DIAGNOSIS — N63 Unspecified lump in unspecified breast: Secondary | ICD-10-CM | POA: Insufficient documentation

## 2011-11-20 DIAGNOSIS — L02412 Cutaneous abscess of left axilla: Secondary | ICD-10-CM

## 2011-11-20 DIAGNOSIS — IMO0002 Reserved for concepts with insufficient information to code with codable children: Secondary | ICD-10-CM

## 2011-11-20 HISTORY — DX: Adverse effect of unspecified anesthetic, initial encounter: T41.45XA

## 2011-11-20 HISTORY — DX: Other complications of anesthesia, initial encounter: T88.59XA

## 2011-11-20 HISTORY — DX: Essential (primary) hypertension: I10

## 2011-11-20 LAB — POCT PREGNANCY, URINE: Preg Test, Ur: NEGATIVE

## 2011-11-20 LAB — URINALYSIS, ROUTINE W REFLEX MICROSCOPIC
Bilirubin Urine: NEGATIVE
Glucose, UA: NEGATIVE mg/dL
Hgb urine dipstick: NEGATIVE
Specific Gravity, Urine: 1.02 (ref 1.005–1.030)
Urobilinogen, UA: 4 mg/dL — ABNORMAL HIGH (ref 0.0–1.0)
pH: 7 (ref 5.0–8.0)

## 2011-11-20 MED ORDER — SULFAMETHOXAZOLE-TRIMETHOPRIM 800-160 MG PO TABS: 1.0000 | ORAL_TABLET | Freq: Two times a day (BID) | ORAL | Status: AC

## 2011-11-20 MED ORDER — OXYCODONE-ACETAMINOPHEN 5-325 MG PO TABS
1.0000 | ORAL_TABLET | Freq: Four times a day (QID) | ORAL | Status: DC | PRN
Start: 2011-11-20 — End: 2012-01-02

## 2011-11-20 MED ORDER — IBUPROFEN 800 MG PO TABS
800.0000 mg | ORAL_TABLET | Freq: Once | ORAL | Status: AC
  Administered 2011-11-20: 800 mg via ORAL
  Filled 2011-11-20: qty 1

## 2011-11-20 NOTE — Progress Notes (Signed)
Patient presents with c/o lump under her left arm was seen at [facility] for this a couple of months ago was told diabetic, was told they keep watching, before was not painful, now very painful.

## 2011-11-20 NOTE — ED Provider Notes (Signed)
History     Chief Complaint  Patient presents with  . Breast Mass   HPI 30 y.o. female with lump inferior to left axilla x 4-5 months, today is swollen and painful. She states the lump started small, "about the size of a gumball" and at times swells to the size of a baseball or larger, then swelling recedes. Has not been painful prior to today. She is being followed for this problem by her PCP and has a f/u appt scheduled on 11/30/10, plan is for surgery/biopsy.     Past Medical History  Diagnosis Date  . Hypertension   . Diabetes mellitus   . Asthma     seasonal  . Complication of anesthesia     Past Surgical History  Procedure Date  . Dilation and curettage of uterus   . Cesarean section     No family history on file.  History  Substance Use Topics  . Smoking status: Former Smoker    Quit date: 11/19/2006  . Smokeless tobacco: Not on file  . Alcohol Use: No    Allergies:  Allergies  Allergen Reactions  . Penicillins Other (See Comments)    Reaction unknown; told by parents    Prescriptions prior to admission  Medication Sig Dispense Refill  . ergocalciferol (VITAMIN D2) 50000 UNITS capsule Take 50,000 Units by mouth once a week.        . hydrocortisone cream 1 % Apply 1 application topically 2 (two) times daily as needed. For eczema       . Lisinopril-Hydrochlorothiazide (PRINZIDE PO) Take by mouth.        . metFORMIN (GLUCOPHAGE) 500 MG tablet Take 500 mg by mouth 2 (two) times daily with a meal.        . naproxen (NAPROSYN) 500 MG tablet Take 500 mg by mouth daily.          Review of Systems  Constitutional: Positive for fever.  Respiratory: Negative.   Cardiovascular: Negative.   Gastrointestinal: Negative for nausea, vomiting, abdominal pain, diarrhea and constipation.  Genitourinary: Negative for dysuria, urgency, frequency, hematuria and flank pain.  Musculoskeletal: Negative.   Neurological: Negative.   Psychiatric/Behavioral: Negative.     Physical Exam   Blood pressure 142/108, pulse 98, temperature 102.7 F (39.3 C), temperature source Oral, resp. rate 16, height 5\' 7"  (1.702 m), weight 221 lb 9.6 oz (100.517 kg).  Physical Exam  Constitutional: She is oriented to person, place, and time. She appears well-developed and well-nourished. No distress.  Neck: Normal range of motion. Neck supple.  Cardiovascular: Normal rate.   Respiratory: Effort normal.    Musculoskeletal: Normal range of motion.  Neurological: She is alert and oriented to person, place, and time.  Skin: Skin is warm and dry.  Psychiatric: She has a normal mood and affect.    MAU Course  Procedures  Motrin 800mg  PO given in MAU  Assessment and Plan  30 y.o. female with chronic mass in left axilla, febrile and tender today, will treat as abscess Rx Bactrim DS bid x 10 days Considering pt has ongoing care with her PCP, she will continue with their plan of care Rev'd precautions, f/u sooner with worsening symptoms   Motty Borin 11/20/2011, 7:23 PM

## 2011-12-26 ENCOUNTER — Telehealth (INDEPENDENT_AMBULATORY_CARE_PROVIDER_SITE_OTHER): Payer: Self-pay | Admitting: General Surgery

## 2011-12-26 ENCOUNTER — Ambulatory Visit (INDEPENDENT_AMBULATORY_CARE_PROVIDER_SITE_OTHER): Payer: Medicaid Other | Admitting: General Surgery

## 2011-12-26 ENCOUNTER — Encounter (INDEPENDENT_AMBULATORY_CARE_PROVIDER_SITE_OTHER): Payer: Self-pay | Admitting: General Surgery

## 2011-12-26 ENCOUNTER — Other Ambulatory Visit (INDEPENDENT_AMBULATORY_CARE_PROVIDER_SITE_OTHER): Payer: Self-pay | Admitting: General Surgery

## 2011-12-26 VITALS — BP 158/96 | HR 72 | Temp 98.4°F | Resp 16 | Ht 67.5 in | Wt 221.2 lb

## 2011-12-26 DIAGNOSIS — R599 Enlarged lymph nodes, unspecified: Secondary | ICD-10-CM

## 2011-12-26 DIAGNOSIS — R591 Generalized enlarged lymph nodes: Secondary | ICD-10-CM | POA: Insufficient documentation

## 2011-12-26 NOTE — Progress Notes (Signed)
Patient ID: [name], female   DOB: 10/13/1981, 31 y.o.   MRN: [id number]  Chief Complaint  Patient presents with  . Lymphadenopathy    new pt- eval axilla    HPI [name] is a 31 y.o. female.   HPI 31 year old African American female referred by Dr Avbuere for evaluation of lymphadenopathy. The patient states that she has had swelling and a bump in her left axilla for about 6-7 months. She states that it will change in size. She states initially she went to the emergency department and was told that it was not an abscess or a boil. She states that she saw another physician about it and said it should be monitored. She went to the emergency department several weeks ago with a high fever, nausea, and soreness in her left axilla. She states that she was told to follow with her primary care physician. She states that the left axilla long will occasionally swell. It will stay swollen for several days and then go back down to normal size. She denies any trauma to the area. She states that she also has some swollen lumps around her neck. She states that she's lost about 10-15 pounds over the past several months. She states it may be due to her diabetes medication. She denies any night sweats. She denies any overlying skin changes except for a mild rash at the base of her neck. It will occasionally itch.   She denies any family history of cancer or lupus. She denies any breast pain or nipple discharge. She denies any myalgias. She denies any heat or cold intolerances. She has a very remote history of gonorrhea and Chlamydia. She has not been out of the country. Past Medical History  Diagnosis Date  . Hypertension   . Diabetes mellitus   . Asthma     seasonal  . Complication of anesthesia     Past Surgical History  Procedure Date  . Dilation and curettage of uterus   . Cesarean section     Family History  Problem Relation Age of Onset  . Diabetes Maternal Grandmother   .  Heart disease Maternal Grandfather     Social History History  Substance Use Topics  . Smoking status: Former Smoker    Quit date: 11/19/2006  . Smokeless tobacco: Not on file  . Alcohol Use: No    Allergies  Allergen Reactions  . Penicillins Other (See Comments)    Reaction unknown; told by parents    Current Outpatient Prescriptions  Medication Sig Dispense Refill  . ergocalciferol (VITAMIN D2) 50000 UNITS capsule Take 50,000 Units by mouth once a week.        . hydrocortisone cream 1 % Apply 1 application topically 2 (two) times daily as needed. For eczema       . Lisinopril-Hydrochlorothiazide (PRINZIDE PO) Take by mouth.        . metFORMIN (GLUCOPHAGE) 500 MG tablet Take 500 mg by mouth 2 (two) times daily with a meal.        . naproxen (NAPROSYN) 500 MG tablet Take 500 mg by mouth daily.        . oxyCODONE-acetaminophen (ROXICET) 5-325 MG per tablet Take 1 tablet by mouth every 6 (six) hours as needed for pain.  20 tablet  0    Review of Systems Review of Systems  Constitutional: Positive for fever and unexpected weight change. Negative for chills, diaphoresis, appetite change and fatigue.  HENT: Negative for   congestion, trouble swallowing, neck pain, neck stiffness, voice change and postnasal drip.   Eyes: Negative for photophobia and visual disturbance.  Respiratory: Negative for cough, chest tightness, shortness of breath, wheezing and stridor.   Cardiovascular: Negative for chest pain, palpitations and leg swelling.  Gastrointestinal: Positive for constipation. Negative for nausea, vomiting, abdominal pain, diarrhea, blood in stool and abdominal distention.  Genitourinary: Negative for dysuria, hematuria, vaginal discharge and difficulty urinating.       Has IUD Very remote h/o Gon/chl  Musculoskeletal: Negative for back pain, joint swelling, arthralgias and gait problem.  Skin: Negative for color change, pallor and wound.       See hpi  Neurological: Negative for  dizziness, syncope, speech difficulty, numbness and headaches.  Hematological: Does not bruise/bleed easily.  Psychiatric/Behavioral: Negative for behavioral problems and confusion.    Blood pressure 158/96, pulse 72, temperature 98.4 F (36.9 C), temperature source Temporal, resp. rate 16, height 5' 7.5" (1.715 m), weight 221 lb 3.2 oz (100.336 kg).  Physical Exam Physical Exam  Vitals reviewed. Constitutional: She is oriented to person, place, and time. She appears well-developed and well-nourished.       obese  HENT:  Head: Normocephalic and atraumatic.  Right Ear: External ear normal.  Left Ear: External ear normal.  Eyes: Conjunctivae are normal. Right eye exhibits no discharge. Left eye exhibits no discharge. No scleral icterus.  Neck: Normal range of motion. Neck supple. No tracheal deviation present. No thyromegaly present.  Cardiovascular: Normal rate, regular rhythm, normal heart sounds and intact distal pulses.   Pulmonary/Chest: Effort normal and breath sounds normal. No respiratory distress. She has no wheezes. She has no rales. Right breast exhibits no inverted nipple, no mass and no nipple discharge. Left breast exhibits no inverted nipple, no mass and no nipple discharge. Breasts are symmetrical.  Abdominal: Soft. Bowel sounds are normal. She exhibits no distension. There is no tenderness. There is no rebound.  Musculoskeletal: Normal range of motion. She exhibits no edema and no tenderness.  Lymphadenopathy:       Head (right side): No submental, no submandibular, no preauricular, no posterior auricular and no occipital adenopathy present.       Head (left side): No submental, no submandibular, no preauricular, no posterior auricular and no occipital adenopathy present.    She has cervical adenopathy.       Right cervical: Superficial cervical and posterior cervical adenopathy present.       Left cervical: Superficial cervical and posterior cervical adenopathy present.     She has axillary adenopathy.       Right axillary: Lateral adenopathy present.       Left axillary: No lateral adenopathy present.      Right: No inguinal adenopathy present.       Left: No inguinal adenopathy present.       Bulky Left axillary LAD, Left axilla is asymmetric compared to Rt axilla;   Neurological: She is alert and oriented to person, place, and time. She exhibits normal muscle tone.  Skin: Skin is warm and dry.       Sort of macular mild rash over supraclavicular area b/l  Psychiatric: She has a normal mood and affect. Her behavior is normal. Judgment and thought content normal.    Data Reviewed Dr Avbuere's notes   Assessment    Left axillary lymphadenopathy Bilateral supraclavicular lymphadenopathy    Plan    The patient has bulky lymphadenopathy in her left axilla as well as bilateral cervical lymphadenopathy.   I do not think this is a benign process. I am most concerned for some form of lymphoma. Also in the differential is lupus.  I've recommended to the patient that we start with imaging. We discussed the pros and cons of ultrasound versus CT imaging. Since she has adenopathy both in her axilla and neck I recommended that we proceed with a CT scan of her neck and chest.  We did discuss the possibility of this being a lymphoma. Explained to the patient that more than likely this will require surgical biopsy. I explained why a needle guided biopsy was not yield sufficient tissue for the pathologist. We are going to go ahead and tentatively schedule the patient for a left axillary excisional lymph node biopsy. We discussed the risks and benefits of surgery including but not limited to bleeding, infection, injury to surrounding structures, hematoma formation, seroma formation, scarring, and possible need for additional procedures. We also talked about the risk of anesthesia as well as blood clot formation.  I explained that I would call her with results of her CT scan.  In the interim we will check a CMET and CBC  Altus Zaino M. Emmamae Mcnamara, MD, FACS General, Bariatric, & Minimally Invasive Surgery [facility]        Vayla Wilhelmi M 12/26/2011, 3:11 PM    

## 2011-12-26 NOTE — Patient Instructions (Addendum)
We will call you with the results of the CT scan \ Lymphadenopathy Lymphadenopathy means "disease of the lymph glands." But the term is usually used to describe swollen or enlarged lymph glands, also called lymph nodes. These are the bean-shaped organs found in many locations including the neck, underarm, and groin. Lymph glands are part of the immune system, which fights infections in your body. Lymphadenopathy can occur in just one area of the body, such as the neck, or it can be generalized, with lymph node enlargement in several areas. The nodes found in the neck are the most common sites of lymphadenopathy. CAUSES  When your immune system responds to germs (such as viruses or bacteria ), infection-fighting cells and fluid build up. This causes the glands to grow in size. This is usually not something to worry about. Sometimes, the glands themselves can become infected and inflamed. This is called lymphadenitis. Enlarged lymph nodes can be caused by many diseases:  Bacterial disease, such as strep throat or a skin infection.   Viral disease, such as a common cold.   Other germs, such as lyme disease, tuberculosis, or sexually transmitted diseases.   Cancers, such as lymphoma (cancer of the lymphatic system) or leukemia (cancer of the white blood cells).   Inflammatory diseases such as lupus or rheumatoid arthritis.   Reactions to medications.  Many of the diseases above are rare, but important. This is why you should see your caregiver if you have lymphadenopathy. SYMPTOMS   Swollen, enlarged lumps in the neck, back of the head or other locations.   Tenderness.   Warmth or redness of the skin over the lymph nodes.   Fever.  DIAGNOSIS  Enlarged lymph nodes are often near the source of infection. They can help healthcare providers diagnose your illness. For instance:   Swollen lymph nodes around the jaw might be caused by an infection in the mouth.   Enlarged glands in the neck  often signal a throat infection.   Lymph nodes that are swollen in more than one area often indicate an illness caused by a virus.  Your caregiver most likely will know what is causing your lymphadenopathy after listening to your history and examining you. Blood tests, x-rays or other tests may be needed. If the cause of the enlarged lymph node cannot be found, and it does not go away by itself, then a biopsy may be needed. Your caregiver will discuss this with you. TREATMENT  Treatment for your enlarged lymph nodes will depend on the cause. Many times the nodes will shrink to normal size by themselves, with no treatment. Antibiotics or other medicines may be needed for infection. Only take over-the-counter or prescription medicines for pain, discomfort or fever as directed by your caregiver. HOME CARE INSTRUCTIONS  Swollen lymph glands usually return to normal when the underlying medical condition goes away. If they persist, contact your health-care provider. He/she might prescribe antibiotics or other treatments, depending on the diagnosis. Take any medications exactly as prescribed. Keep any follow-up appointments made to check on the condition of your enlarged nodes.  SEEK MEDICAL CARE IF:   Swelling lasts for more than two weeks.   You have symptoms such as weight loss, night sweats, fatigue or fever that does not go away.   The lymph nodes are hard, seem fixed to the skin or are growing rapidly.   Skin over the lymph nodes is red and inflamed. This could mean there is an infection.  SEEK IMMEDIATE MEDICAL  CARE IF:   Fluid starts leaking from the area of the enlarged lymph node.   You develop a fever of 102 F (38.9 C) or greater.   Severe pain develops (not necessarily at the site of a large lymph node).   You develop chest pain or shortness of breath.   You develop worsening abdominal pain.  MAKE SURE YOU:   Understand these instructions.   Will watch your condition.   Will  get help right away if you are not doing well or get worse.  Document Released: 08/23/2008 Document Revised: 07/27/2011 Document Reviewed: 08/23/2008 [facility] Patient Information 2012 [place], [place].

## 2011-12-27 NOTE — Telephone Encounter (Signed)
Patient undergoing additional testing pre-operatively. I will make sure that her surgery date doesn't change prior to making appt, so will call after her surgery with a post op appt.

## 2011-12-28 ENCOUNTER — Telehealth (INDEPENDENT_AMBULATORY_CARE_PROVIDER_SITE_OTHER): Payer: Self-pay | Admitting: General Surgery

## 2011-12-28 NOTE — Telephone Encounter (Signed)
Left message for patient to call me back. Need to let her know we got the insurance denial for her tests scheduled for tomorrow. I am unable to get through to anyone at [employer] to try to figure out what we need to do to get this approved so I am going to inform her we may need to cancel these tests until we get insurance approval. Awaiting returned call.

## 2011-12-29 ENCOUNTER — Other Ambulatory Visit: Payer: Medicaid Other

## 2011-12-29 NOTE — Telephone Encounter (Signed)
Spoke with patient. Made her aware her tests were denied by medicaid. I am making attempts to contact the insurance to get a number for a peer to peer so Dr Andrey Campanile can speak with someone about getting this approved. Will call and let her know when I hear from them.

## 2011-12-30 ENCOUNTER — Telehealth (INDEPENDENT_AMBULATORY_CARE_PROVIDER_SITE_OTHER): Payer: Self-pay | Admitting: General Surgery

## 2011-12-30 HISTORY — PX: OTHER SURGICAL HISTORY: SHX169

## 2011-12-30 NOTE — Telephone Encounter (Signed)
Had ordered ct neck/chest for this pt. It was denied. Spoke with insurance physician. Agreed with axillary ultrasound. Will cancel ct chest/neck and get axillary ultrasound.

## 2012-01-02 ENCOUNTER — Telehealth (INDEPENDENT_AMBULATORY_CARE_PROVIDER_SITE_OTHER): Payer: Self-pay | Admitting: General Surgery

## 2012-01-02 ENCOUNTER — Encounter (HOSPITAL_COMMUNITY): Payer: Self-pay

## 2012-01-02 DIAGNOSIS — R591 Generalized enlarged lymph nodes: Secondary | ICD-10-CM

## 2012-01-02 NOTE — Telephone Encounter (Signed)
Message copied by Liliana Cline on Mon Jan 02, 2012  9:21 AM ------      Message from: Andrey Campanile, ERIC M      Created: Fri Dec 30, 2011  4:09 PM       Cancel ct of neck/chest.      Order left axilla ultrasound. Same indications.              gonna still proceed with excisional biopsy            Thanks,            wilson

## 2012-01-02 NOTE — Telephone Encounter (Signed)
Left message making patient aware we are unable to get insurance to pay for CT but per Dr Andrey Campanile we will order an ultrasound and proceed with surgery. If she has any questions she is to call back but made her aware our referral coordinator will be giving her a call with appt date/time.

## 2012-01-06 ENCOUNTER — Encounter (HOSPITAL_COMMUNITY)
Admission: RE | Admit: 2012-01-06 | Discharge: 2012-01-06 | Disposition: A | Discharge status: Home / Self Care | Payer: Medicaid Other | Source: Ambulatory Visit | Attending: General Surgery | Admitting: General Surgery

## 2012-01-06 ENCOUNTER — Ambulatory Visit (HOSPITAL_COMMUNITY)
Admission: RE | Admit: 2012-01-06 | Discharge: 2012-01-06 | Disposition: A | Discharge status: Home / Self Care | Payer: Medicaid Other | Source: Ambulatory Visit | Attending: General Surgery | Admitting: General Surgery

## 2012-01-06 ENCOUNTER — Encounter (HOSPITAL_COMMUNITY): Payer: Self-pay

## 2012-01-06 ENCOUNTER — Other Ambulatory Visit: Payer: Self-pay

## 2012-01-06 DIAGNOSIS — E119 Type 2 diabetes mellitus without complications: Secondary | ICD-10-CM | POA: Insufficient documentation

## 2012-01-06 DIAGNOSIS — Z01810 Encounter for preprocedural cardiovascular examination: Secondary | ICD-10-CM | POA: Insufficient documentation

## 2012-01-06 DIAGNOSIS — Z01812 Encounter for preprocedural laboratory examination: Secondary | ICD-10-CM | POA: Insufficient documentation

## 2012-01-06 DIAGNOSIS — Z01818 Encounter for other preprocedural examination: Secondary | ICD-10-CM | POA: Insufficient documentation

## 2012-01-06 HISTORY — DX: Dermatitis, unspecified: L30.9

## 2012-01-06 LAB — CBC
MCH: 29.6 pg (ref 26.0–34.0)
MCHC: 34.1 g/dL (ref 30.0–36.0)
MCV: 86.9 fL (ref 78.0–100.0)
Platelets: 292 10*3/uL (ref 150–400)
RDW: 14.2 % (ref 11.5–15.5)

## 2012-01-06 LAB — HCG, SERUM, QUALITATIVE: Preg, Serum: NEGATIVE

## 2012-01-06 LAB — BASIC METABOLIC PANEL
BUN: 12 mg/dL (ref 6–23)
Calcium: 9.4 mg/dL (ref 8.4–10.5)
Chloride: 102 mEq/L (ref 96–112)
Creatinine, Ser: 0.76 mg/dL (ref 0.50–1.10)
GFR calc Af Amer: >90 mL/min (ref >90)
GFR calc non Af Amer: >90 mL/min (ref >90)

## 2012-01-06 LAB — SURGICAL PCR SCREEN: MRSA, PCR: NEGATIVE

## 2012-01-06 MED ORDER — CHLORHEXIDINE GLUCONATE 4 % EX LIQD
1.0000 "application " | Freq: Once | CUTANEOUS | Status: DC
  Filled 2012-01-06: qty 15

## 2012-01-06 NOTE — Patient Instructions (Addendum)
20 [name]  01/06/2012   Your procedure is scheduled on:  01/12/12  Report to [facility] at 5:30 AM.  Call this number if you have problems the morning of surgery: [phone]   Remember:   Do not eat food:After Midnight.  May have clear liquids: up to 4 Hrs. before arrival.( 6 HRS BEFORE SURGERY)  Clear liquids include soda, tea, black coffee, apple or grape juice, broth.  Take these medicines the morning of surgery with A SIP OF WATER: NONE   Do not wear jewelry, make-up or nail polish.  Do not wear lotions, powders, or perfumes.   Do not shave underarms or legs 48 hours prior to surgery (men may shave face)  Do not bring valuables to the hospital.  Contacts, dentures or bridgework may not be worn into surgery.  Leave suitcase in the car. After surgery it may be brought to your room.  For patients admitted to the hospital, checkout time is 11:00 AM the day of discharge.   Patients discharged the day of surgery will not be allowed to drive home.  Name and phone number of your driver:   Special Instructions: CHG Shower Use Special Wash: 1/2 bottle night before surgery and 1/2 bottle morning of surgery.   Please read over the following fact sheets that you were given: MRSA Information  20 [name]  01/06/2012

## 2012-01-09 ENCOUNTER — Other Ambulatory Visit: Payer: Medicaid Other

## 2012-01-10 ENCOUNTER — Telehealth (INDEPENDENT_AMBULATORY_CARE_PROVIDER_SITE_OTHER): Payer: Self-pay | Admitting: General Surgery

## 2012-01-10 NOTE — Telephone Encounter (Signed)
Message copied by Liliana Cline on Tue Jan 10, 2012  8:40 AM ------      Message from: [place], [place]      Created: Mon Jan 09, 2012 10:15 AM       Left axillary ultrasound today 01/09/12 enlarged lymph nodes

## 2012-01-10 NOTE — Telephone Encounter (Signed)
Patient missed appt, thought it was scheduled for Wednesday at 3:30pm. I called up the breast center and they can get her in tomorrow at 2:40pm. Patient informed of test date and time. She will show up.

## 2012-01-11 ENCOUNTER — Ambulatory Visit
Admission: RE | Admit: 2012-01-11 | Discharge: 2012-01-11 | Disposition: A | Discharge status: Home / Self Care | Payer: Medicaid Other | Source: Ambulatory Visit | Attending: General Surgery | Admitting: General Surgery

## 2012-01-11 ENCOUNTER — Telehealth (INDEPENDENT_AMBULATORY_CARE_PROVIDER_SITE_OTHER): Payer: Self-pay

## 2012-01-11 ENCOUNTER — Ambulatory Visit: Admission: RE | Admit: 2012-01-11 | Payer: Medicaid Other | Source: Ambulatory Visit

## 2012-01-11 ENCOUNTER — Other Ambulatory Visit (INDEPENDENT_AMBULATORY_CARE_PROVIDER_SITE_OTHER): Payer: Self-pay | Admitting: General Surgery

## 2012-01-11 DIAGNOSIS — R599 Enlarged lymph nodes, unspecified: Secondary | ICD-10-CM

## 2012-01-11 DIAGNOSIS — R591 Generalized enlarged lymph nodes: Secondary | ICD-10-CM

## 2012-01-11 NOTE — Telephone Encounter (Signed)
The patient called and states she is at [facility] [address]  location.  They are telling her they have her down for a ct but she is supposed to have an ultrasound today before her surgery tomorrow.  The ct wasn't approved by the insurance so the ultrasound was ordered.  I look in the computer and it looks like the ct wasn't cancelled.  I called the breast center and Baird Lyons said they have her down for 2:40 but she hadn't shown up.  I explained what happened and Baird Lyons said if she comes now they can still get her in.  I called [facility] and spoke to [place].  She will sent the pt right over.

## 2012-01-12 ENCOUNTER — Encounter (HOSPITAL_COMMUNITY): Payer: Self-pay | Admitting: *Deleted

## 2012-01-12 ENCOUNTER — Encounter (HOSPITAL_COMMUNITY)
Admission: RE | Disposition: A | Discharge status: Home / Self Care | Payer: Self-pay | Source: Ambulatory Visit | Attending: General Surgery

## 2012-01-12 ENCOUNTER — Other Ambulatory Visit (INDEPENDENT_AMBULATORY_CARE_PROVIDER_SITE_OTHER): Payer: Self-pay | Admitting: General Surgery

## 2012-01-12 ENCOUNTER — Encounter (HOSPITAL_COMMUNITY): Payer: Self-pay | Admitting: Anesthesiology

## 2012-01-12 ENCOUNTER — Ambulatory Visit (HOSPITAL_COMMUNITY): Payer: Medicaid Other | Admitting: Anesthesiology

## 2012-01-12 ENCOUNTER — Ambulatory Visit (HOSPITAL_COMMUNITY)
Admission: RE | Admit: 2012-01-12 | Discharge: 2012-01-12 | Disposition: A | Discharge status: Home / Self Care | Payer: Medicaid Other | Source: Ambulatory Visit | Attending: General Surgery | Admitting: General Surgery

## 2012-01-12 DIAGNOSIS — C8194 Hodgkin lymphoma, unspecified, lymph nodes of axilla and upper limb: Secondary | ICD-10-CM | POA: Insufficient documentation

## 2012-01-12 DIAGNOSIS — C8190 Hodgkin lymphoma, unspecified, unspecified site: Secondary | ICD-10-CM

## 2012-01-12 DIAGNOSIS — Z79899 Other long term (current) drug therapy: Secondary | ICD-10-CM | POA: Insufficient documentation

## 2012-01-12 DIAGNOSIS — I10 Essential (primary) hypertension: Secondary | ICD-10-CM | POA: Insufficient documentation

## 2012-01-12 DIAGNOSIS — R599 Enlarged lymph nodes, unspecified: Secondary | ICD-10-CM | POA: Insufficient documentation

## 2012-01-12 DIAGNOSIS — E119 Type 2 diabetes mellitus without complications: Secondary | ICD-10-CM | POA: Insufficient documentation

## 2012-01-12 LAB — GLUCOSE, CAPILLARY
Glucose-Capillary: 124 mg/dL — ABNORMAL HIGH (ref 70–99)
Glucose-Capillary: 157 mg/dL — ABNORMAL HIGH (ref 70–99)

## 2012-01-12 SURGERY — AXILLARY LYMPH NODE BIOPSY
Anesthesia: General | Site: Axilla | Laterality: Left | Wound class: Clean

## 2012-01-12 MED ORDER — MIDAZOLAM HCL 5 MG/5ML IJ SOLN
INTRAMUSCULAR | Status: DC | PRN
  Administered 2012-01-12: 2 mg via INTRAVENOUS

## 2012-01-12 MED ORDER — ACETAMINOPHEN 10 MG/ML IV SOLN
INTRAVENOUS | Status: DC | PRN
  Administered 2012-01-12: 1000 mg via INTRAVENOUS

## 2012-01-12 MED ORDER — BUPIVACAINE-EPINEPHRINE PF 0.25-1:200000 % IJ SOLN
INTRAMUSCULAR | Status: AC
  Filled 2012-01-12: qty 30

## 2012-01-12 MED ORDER — OXYCODONE HCL 5 MG PO TABS
5.0000 mg | ORAL_TABLET | ORAL | Status: DC | PRN
  Administered 2012-01-12: 5 mg via ORAL

## 2012-01-12 MED ORDER — FENTANYL CITRATE 0.05 MG/ML IJ SOLN: 25.0000 ug | INTRAMUSCULAR | Status: DC | PRN

## 2012-01-12 MED ORDER — LACTATED RINGERS IV SOLN: INTRAVENOUS | Status: DC

## 2012-01-12 MED ORDER — OXYCODONE HCL 5 MG PO TABS
ORAL_TABLET | ORAL | Status: AC
  Filled 2012-01-12: qty 1

## 2012-01-12 MED ORDER — PROMETHAZINE HCL 25 MG/ML IJ SOLN: 6.2500 mg | INTRAMUSCULAR | Status: DC | PRN

## 2012-01-12 MED ORDER — CIPROFLOXACIN IN D5W 400 MG/200ML IV SOLN
400.0000 mg | INTRAVENOUS | Status: AC
  Administered 2012-01-12: 400 mg via INTRAVENOUS

## 2012-01-12 MED ORDER — ONDANSETRON HCL 4 MG/2ML IJ SOLN: 4.0000 mg | Freq: Four times a day (QID) | INTRAMUSCULAR | Status: DC | PRN

## 2012-01-12 MED ORDER — LACTATED RINGERS IV SOLN
INTRAVENOUS | Status: DC | PRN
  Administered 2012-01-12: 07:00:00 via INTRAVENOUS

## 2012-01-12 MED ORDER — ACETAMINOPHEN 10 MG/ML IV SOLN
INTRAVENOUS | Status: AC
  Filled 2012-01-12: qty 100

## 2012-01-12 MED ORDER — PROMETHAZINE HCL 25 MG/ML IJ SOLN: 12.5000 mg | Freq: Four times a day (QID) | INTRAMUSCULAR | Status: DC | PRN

## 2012-01-12 MED ORDER — SODIUM CHLORIDE 0.9 % IV SOLN: 250.0000 mL | INTRAVENOUS | Status: DC | PRN

## 2012-01-12 MED ORDER — PROPOFOL 10 MG/ML IV BOLUS
INTRAVENOUS | Status: DC | PRN
  Administered 2012-01-12: 150 mg via INTRAVENOUS

## 2012-01-12 MED ORDER — SODIUM CHLORIDE 0.9 % IJ SOLN: 3.0000 mL | INTRAMUSCULAR | Status: DC | PRN

## 2012-01-12 MED ORDER — ONDANSETRON HCL 4 MG/2ML IJ SOLN
INTRAMUSCULAR | Status: DC | PRN
  Administered 2012-01-12: 4 mg via INTRAVENOUS

## 2012-01-12 MED ORDER — BUPIVACAINE-EPINEPHRINE 0.25% -1:200000 IJ SOLN
INTRAMUSCULAR | Status: DC | PRN
  Administered 2012-01-12: 20 mL

## 2012-01-12 MED ORDER — ACETAMINOPHEN 650 MG RE SUPP
650.0000 mg | RECTAL | Status: DC | PRN
  Filled 2012-01-12: qty 1

## 2012-01-12 MED ORDER — ACETAMINOPHEN 325 MG PO TABS: 650.0000 mg | ORAL_TABLET | ORAL | Status: DC | PRN

## 2012-01-12 MED ORDER — FENTANYL CITRATE 0.05 MG/ML IJ SOLN
INTRAMUSCULAR | Status: DC | PRN
  Administered 2012-01-12 (×2): 50 ug via INTRAVENOUS
  Administered 2012-01-12: 100 ug via INTRAVENOUS
  Administered 2012-01-12: 50 ug via INTRAVENOUS

## 2012-01-12 MED ORDER — SODIUM CHLORIDE 0.9 % IJ SOLN: 3.0000 mL | Freq: Two times a day (BID) | INTRAMUSCULAR | Status: DC

## 2012-01-12 MED ORDER — 0.9 % SODIUM CHLORIDE (POUR BTL) OPTIME
TOPICAL | Status: DC | PRN
  Administered 2012-01-12: 1000 mL

## 2012-01-12 MED ORDER — CIPROFLOXACIN IN D5W 400 MG/200ML IV SOLN
INTRAVENOUS | Status: AC
  Filled 2012-01-12: qty 200

## 2012-01-12 MED ORDER — OXYCODONE-ACETAMINOPHEN 5-325 MG PO TABS: 1.0000 | ORAL_TABLET | ORAL | Status: AC | PRN

## 2012-01-12 SURGICAL SUPPLY — 39 items
ADH SKN CLS APL DERMABOND .7 (GAUZE/BANDAGES/DRESSINGS) ×1
BANDAGE ELASTIC 6 VELCRO ST LF (GAUZE/BANDAGES/DRESSINGS) ×1 IMPLANT
BLADE SURG 15 STRL LF DISP TIS (BLADE) ×3 IMPLANT
BLADE SURG 15 STRL SS (BLADE) ×2
CANISTER SUCTION 2500CC (MISCELLANEOUS) ×2 IMPLANT
CLEANER TIP ELECTROSURG 2X2 (MISCELLANEOUS) ×2 IMPLANT
CLOTH BEACON ORANGE TIMEOUT ST (SAFETY) ×2 IMPLANT
DECANTER SPIKE VIAL GLASS SM (MISCELLANEOUS) ×1 IMPLANT
DERMABOND ADVANCED (GAUZE/BANDAGES/DRESSINGS) ×1
DERMABOND ADVANCED .7 DNX12 (GAUZE/BANDAGES/DRESSINGS) IMPLANT
DRAPE LAPAROTOMY TRNSV 102X78 (DRAPE) ×2 IMPLANT
ELECT COATED BLADE 2.86 ST (ELECTRODE) ×2 IMPLANT
ELECT REM PT RETURN 9FT ADLT (ELECTROSURGICAL) ×2
ELECTRODE REM PT RTRN 9FT ADLT (ELECTROSURGICAL) ×1 IMPLANT
GAUZE SPONGE 4X4 16PLY XRAY LF (GAUZE/BANDAGES/DRESSINGS) ×1 IMPLANT
GLOVE BIO SURGEON STRL SZ7.5 (GLOVE) ×4 IMPLANT
GLOVE BIOGEL PI IND STRL 7.0 (GLOVE) ×1 IMPLANT
GLOVE BIOGEL PI INDICATOR 7.0 (GLOVE) ×1
GOWN PREVENTION PLUS XLARGE (GOWN DISPOSABLE) ×2 IMPLANT
GOWN STRL NON-REIN LRG LVL3 (GOWN DISPOSABLE) ×1 IMPLANT
GOWN STRL REIN XL XLG (GOWN DISPOSABLE) ×3 IMPLANT
HEMOSTAT SURGICEL 2X3 (HEMOSTASIS) ×1 IMPLANT
KIT BASIN OR (CUSTOM PROCEDURE TRAY) ×2 IMPLANT
NDL HYPO 25X1 1.5 SAFETY (NEEDLE) ×1 IMPLANT
NEEDLE HYPO 22GX1.5 SAFETY (NEEDLE) ×1 IMPLANT
NEEDLE HYPO 25X1 1.5 SAFETY (NEEDLE) ×2 IMPLANT
PACK BASIC VI WITH GOWN DISP (CUSTOM PROCEDURE TRAY) ×2 IMPLANT
PEN SKIN MARKING BROAD (MISCELLANEOUS) ×2 IMPLANT
PENCIL BUTTON HOLSTER BLD 10FT (ELECTRODE) ×2 IMPLANT
SOL PREP POV-IOD 16OZ 10% (MISCELLANEOUS) ×1 IMPLANT
SPONGE GAUZE 4X4 12PLY (GAUZE/BANDAGES/DRESSINGS) ×1 IMPLANT
SPONGE LAP 4X18 X RAY DECT (DISPOSABLE) ×2 IMPLANT
STRIP CLOSURE SKIN 1/4X3 (GAUZE/BANDAGES/DRESSINGS) ×2 IMPLANT
SUT VIC AB 3-0 SH 27 (SUTURE) ×2
SUT VIC AB 3-0 SH 27XBRD (SUTURE) IMPLANT
SYR BULB IRRIGATION 50ML (SYRINGE) ×2 IMPLANT
SYR CONTROL 10ML LL (SYRINGE) ×2 IMPLANT
TOWEL OR 17X26 10 PK STRL BLUE (TOWEL DISPOSABLE) ×3 IMPLANT
YANKAUER SUCT BULB TIP 10FT TU (MISCELLANEOUS) ×2 IMPLANT

## 2012-01-12 NOTE — Op Note (Signed)
01/12/2012  [name]  Preoperative diagnosis: Lymphadenopathy  Postoperative diagnosis: Same  Procedure: Left axillary lymph node biopsy  Surgeon: Mary Sella. [name] M.D.  Anesthesia: Gen. with LMA plus 0.25% Marcaine with epi  Specimen: Left axillary lymph node  EBL: Less than 25 cc  Complications: None immediately apparent  Indications for procedure: The patient is a 31 year old African American female who's had a 6-8 month history of enlarged lymph nodes in her left axilla and in her neck. She has been on several rounds of antibiotics with no resolution of her adenopathy. On physical exam she had bilateral axillary lymphadenopathy as well as bilateral cervical lymphadenopathy. Because of the concern of underlying neoplasm I recommended an excisional lymph node biopsy. We discussed the risk and benefits of surgery including but not limited to bleeding, infection, seroma formation, hematoma formation, scarring, possible need for additional procedures. We also discussed the risk of blood clot formation and anesthesia. We also discussed the typical postoperative course.  Description of procedure: The patient was identified in the holding area and the left axilla was marked with my initials. She was then taken to the operating room at [facility]. She was placed supine on the operating table. Sequential compression devices were placed. General LMA anesthesia was established. Her left axilla and proximal arm and left chest wall was prepped with ChloraPrep. She was then draped in the usual standard surgical fashion. She received antibiotics prior to skin incision. A surgical timeout was performed.  Her adenopathy in the left axilla was easily palpable. I made a 2 inch incision in her left axilla with a #15 blade. The subcutaneous tissue was divided with electrocautery. I identified a large clump of enlarged lymph nodes. I sharply excised several pieces of the lymph node with a 15  blade. As expected the cut surface of the lymph node. The specimens were placed in specimen cup and sent to pathology as a fresh specimen. Pathology confirmed that they had adequate tissue for analysis. Hemostasis was achieved with electrocautery. I did place a small piece of Surgicel over the lymph node biopsy site. The wound was irrigated. The deep dermis was reapproximated with inverted interrupted 3-0 Vicryl sutures. The skin was reapproximated with a running 4-0 Monocryl in subcuticular fashion. Dermabond was applied to the skin. All needle instrument and sponge count were correct x2. Patient was extubated and taken to recovery in stable condition. There are no immediate complications.  Mary Sella. Andrey Campanile, MD, FACS General, Bariatric, & Minimally Invasive Surgery [facility] [place]

## 2012-01-12 NOTE — Anesthesia Preprocedure Evaluation (Addendum)
Anesthesia Evaluation  Patient identified by MRN, date of birth, ID band Patient awake    Reviewed: Allergy & Precautions, H&P , NPO status , Patient's Chart, lab work & pertinent test results  History of Anesthesia Complications (+) PROLONGED EMERGENCENegative for: history of anesthetic complications  Airway Mallampati: II TM Distance: >3 FB Neck ROM: full    Dental No notable dental hx. (+) Teeth Intact and Dental Advisory Given   Pulmonary asthma ,  MILD SEASONAL clear to auscultation  Pulmonary exam normal       Cardiovascular Exercise Tolerance: Good hypertension, On Medications regular Normal    Neuro/Psych Negative Neurological ROS  Negative Psych ROS   GI/Hepatic negative GI ROS, Neg liver ROS,   Endo/Other  Diabetes mellitus-, Well Controlled, Oral Hypoglycemic Agents  Renal/GU negative Renal ROS  Genitourinary negative   Musculoskeletal   Abdominal   Peds  Hematology negative hematology ROS (+)   Anesthesia Other Findings   Reproductive/Obstetrics negative OB ROS                         Anesthesia Physical Anesthesia Plan  ASA: III  Anesthesia Plan: General   Post-op Pain Management:    Induction: Intravenous  Airway Management Planned: LMA  Additional Equipment:   Intra-op Plan:   Post-operative Plan:   Informed Consent: I have reviewed the patients History and Physical, chart, labs and discussed the procedure including the risks, benefits and alternatives for the proposed anesthesia with the patient or authorized representative who has indicated his/her understanding and acceptance.   Dental Advisory Given  Plan Discussed with: CRNA and Surgeon  Anesthesia Plan Comments:         Anesthesia Quick Evaluation

## 2012-01-12 NOTE — Discharge Instructions (Signed)
[facility] Office Phone Number [phone]   POST OP INSTRUCTIONS  Always review your discharge instruction sheet given to you by the facility where your surgery was performed.  IF YOU HAVE DISABILITY OR FAMILY LEAVE FORMS, YOU MUST BRING THEM TO THE OFFICE FOR PROCESSING.  DO NOT GIVE THEM TO YOUR DOCTOR.  1. A prescription for pain medication may be given to you upon discharge.  Take your pain medication as prescribed, if needed.  If narcotic pain medicine is not needed, then you may take acetaminophen (Tylenol) or ibuprofen (Advil) as needed. 2. Take your usually prescribed medications unless otherwise directed 3. If you need a refill on your pain medication, please contact your pharmacy.  They will contact our office to request authorization.  Prescriptions will not be filled after 5pm or on week-ends. 4. You should eat very light the first 24 hours after surgery, such as soup, crackers, pudding, etc.  Resume your normal diet the day after surgery. 5. Most patients will experience some swelling and bruising in the axilla.  Ice packs will help.  Swelling and bruising can take several days to resolve.  6. It is common to experience some constipation if taking pain medication after surgery.  Increasing fluid intake and taking a stool softener will usually help or prevent this problem from occurring.  A mild laxative (Milk of Magnesia or Miralax) should be taken according to package directions if there are no bowel movements after 48 hours. 7. If your surgeon used skin glue on the incision, you may shower in 24 hours.  The glue will flake off over the next 2-3 weeks.  Any sutures or staples will be removed at the office during your follow-up visit. 8. ACTIVITIES:  You may resume regular daily activities (gradually increasing) beginning the next day.   You may have sexual intercourse when it is comfortable. a. You may drive when you no longer are taking prescription pain medication,  you can comfortably wear a seatbelt, and you can safely maneuver your car and apply brakes. b. RETURN TO WORK:  2-3 days 9. You should see your doctor in the office for a follow-up appointment approximately two weeks after your surgery.  Your doctor's nurse will typically make your follow-up appointment when she calls you with your pathology report.  Expect your pathology report 2-3 business days after your surgery.  You may call to check if you do not hear from [place] after three days. OTHER INSTRUCTIONS: NO heavy lifting or strenuous activity with left arm for 2 weeks  WHEN TO CALL YOUR DOCTOR: 1. Fever over 101.0 2. Nausea and/or vomiting. 3. Extreme swelling or bruising. 4. Continued bleeding from incision. 5. Increased pain, redness, or drainage from the incision.  The clinic staff is available to answer your questions during regular business hours.  Please don't hesitate to call and ask to speak to one of the nurses for clinical concerns.  If you have a medical emergency, go to the nearest emergency room or call 911.  A surgeon from [facility] is always on call at the hospital.  For further questions, please visit [email or web address]

## 2012-01-12 NOTE — Transfer of Care (Signed)
Immediate Anesthesia Transfer of Care Note  Patient: [name]  Procedure(s) Performed: Procedure(s) (LRB): AXILLARY LYMPH NODE BIOPSY (Left)  Patient Location: PACU  Anesthesia Type: General  Level of Consciousness: awake, alert , oriented and patient cooperative  Airway & Oxygen Therapy: Patient Spontanous Breathing and Patient connected to face mask oxygen  Post-op Assessment: Report given to PACU RN and Post -op Vital signs reviewed and stable  Post vital signs: Reviewed and stable  Complications: No apparent anesthesia complications

## 2012-01-12 NOTE — Preoperative (Signed)
Beta Blockers   Reason not to administer Beta Blockers:Not Applicable 

## 2012-01-12 NOTE — Interval H&P Note (Signed)
History and Physical Interval Note:  01/12/2012 7:06 AM  [name]  has presented today for surgery, with the diagnosis of lymphadenopathy  The various methods of treatment have been discussed with the patient and family. After consideration of risks, benefits and other options for treatment, the patient has consented to  Procedure(s) (LRB): AXILLARY LYMPH NODE BIOPSY (Left) as a surgical intervention .  The patients' history has been reviewed, patient examined, no change in status, stable for surgery.  I have reviewed the patients' chart and labs.  Questions were answered to the patient's satisfaction.    Mary Sella. Andrey Campanile, MD, FACS General, Bariatric, & Minimally Invasive Surgery [facility], [place]   [facility]

## 2012-01-12 NOTE — H&P (View-Only) (Signed)
Patient ID: [name], female   DOB: [birth year], 31 y.o.   MRN: [id number]  Chief Complaint  Patient presents with  . Lymphadenopathy    new pt- eval axilla    HPI [name] is a 31 y.o. female.   HPI 31 year old [place] American female referred by Dr Concepcion Elk for evaluation of lymphadenopathy. The patient states that she has had swelling and a bump in her left axilla for about 6-7 months. She states that it will change in size. She states initially she went to the emergency department and was told that it was not an abscess or a boil. She states that she saw another physician about it and said it should be monitored. She went to the emergency department several weeks ago with a high fever, nausea, and soreness in her left axilla. She states that she was told to follow with her primary care physician. She states that the left axilla long will occasionally swell. It will stay swollen for several days and then go back down to normal size. She denies any trauma to the area. She states that she also has some swollen lumps around her neck. She states that she's lost about 10-15 pounds over the past several months. She states it may be due to her diabetes medication. She denies any night sweats. She denies any overlying skin changes except for a mild rash at the base of her neck. It will occasionally itch.   She denies any family history of cancer or lupus. She denies any breast pain or nipple discharge. She denies any myalgias. She denies any heat or cold intolerances. She has a very remote history of gonorrhea and Chlamydia. She has not been out of the country. Past Medical History  Diagnosis Date  . Hypertension   . Diabetes mellitus   . Asthma     seasonal  . Complication of anesthesia     Past Surgical History  Procedure Date  . Dilation and curettage of uterus   . Cesarean section     Family History  Problem Relation Age of Onset  . Diabetes Maternal Grandmother   .  Heart disease Maternal Grandfather     Social History History  Substance Use Topics  . Smoking status: Former Smoker    Quit date: 11/19/2006  . Smokeless tobacco: Not on file  . Alcohol Use: No    Allergies  Allergen Reactions  . Penicillins Other (See Comments)    Reaction unknown; told by parents    Current Outpatient Prescriptions  Medication Sig Dispense Refill  . ergocalciferol (VITAMIN D2) 50000 UNITS capsule Take 50,000 Units by mouth once a week.        . hydrocortisone cream 1 % Apply 1 application topically 2 (two) times daily as needed. For eczema       . Lisinopril-Hydrochlorothiazide (PRINZIDE PO) Take by mouth.        . metFORMIN (GLUCOPHAGE) 500 MG tablet Take 500 mg by mouth 2 (two) times daily with a meal.        . naproxen (NAPROSYN) 500 MG tablet Take 500 mg by mouth daily.        Marland Kitchen oxyCODONE-acetaminophen (ROXICET) 5-325 MG per tablet Take 1 tablet by mouth every 6 (six) hours as needed for pain.  20 tablet  0    Review of Systems Review of Systems  Constitutional: Positive for fever and unexpected weight change. Negative for chills, diaphoresis, appetite change and fatigue.  HENT: Negative for  congestion, trouble swallowing, neck pain, neck stiffness, voice change and postnasal drip.   Eyes: Negative for photophobia and visual disturbance.  Respiratory: Negative for cough, chest tightness, shortness of breath, wheezing and stridor.   Cardiovascular: Negative for chest pain, palpitations and leg swelling.  Gastrointestinal: Positive for constipation. Negative for nausea, vomiting, abdominal pain, diarrhea, blood in stool and abdominal distention.  Genitourinary: Negative for dysuria, hematuria, vaginal discharge and difficulty urinating.       Has IUD Very remote h/o Gon/chl  Musculoskeletal: Negative for back pain, joint swelling, arthralgias and gait problem.  Skin: Negative for color change, pallor and wound.       See hpi  Neurological: Negative for  dizziness, syncope, speech difficulty, numbness and headaches.  Hematological: Does not bruise/bleed easily.  Psychiatric/Behavioral: Negative for behavioral problems and confusion.    Blood pressure 158/96, pulse 72, temperature 98.4 F (36.9 C), temperature source Temporal, resp. rate 16, height 5' 7.5" (1.715 m), weight 221 lb 3.2 oz (100.336 kg).  Physical Exam Physical Exam  Vitals reviewed. Constitutional: She is oriented to person, place, and time. She appears well-developed and well-nourished.       obese  HENT:  Head: Normocephalic and atraumatic.  Right Ear: External ear normal.  Left Ear: External ear normal.  Eyes: Conjunctivae are normal. Right eye exhibits no discharge. Left eye exhibits no discharge. No scleral icterus.  Neck: Normal range of motion. Neck supple. No tracheal deviation present. No thyromegaly present.  Cardiovascular: Normal rate, regular rhythm, normal heart sounds and intact distal pulses.   Pulmonary/Chest: Effort normal and breath sounds normal. No respiratory distress. She has no wheezes. She has no rales. Right breast exhibits no inverted nipple, no mass and no nipple discharge. Left breast exhibits no inverted nipple, no mass and no nipple discharge. Breasts are symmetrical.  Abdominal: Soft. Bowel sounds are normal. She exhibits no distension. There is no tenderness. There is no rebound.  Musculoskeletal: Normal range of motion. She exhibits no edema and no tenderness.  Lymphadenopathy:       Head (right side): No submental, no submandibular, no preauricular, no posterior auricular and no occipital adenopathy present.       Head (left side): No submental, no submandibular, no preauricular, no posterior auricular and no occipital adenopathy present.    She has cervical adenopathy.       Right cervical: Superficial cervical and posterior cervical adenopathy present.       Left cervical: Superficial cervical and posterior cervical adenopathy present.     She has axillary adenopathy.       Right axillary: Lateral adenopathy present.       Left axillary: No lateral adenopathy present.      Right: No inguinal adenopathy present.       Left: No inguinal adenopathy present.       Bulky Left axillary LAD, Left axilla is asymmetric compared to Rt axilla;   Neurological: She is alert and oriented to person, place, and time. She exhibits normal muscle tone.  Skin: Skin is warm and dry.       Sort of macular mild rash over supraclavicular area b/l  Psychiatric: She has a normal mood and affect. Her behavior is normal. Judgment and thought content normal.    Data Reviewed Dr Avbuere's notes   Assessment    Left axillary lymphadenopathy Bilateral supraclavicular lymphadenopathy    Plan    The patient has bulky lymphadenopathy in her left axilla as well as bilateral cervical lymphadenopathy.  I do not think this is a benign process. I am most concerned for some form of lymphoma. Also in the differential is lupus.  I've recommended to the patient that we start with imaging. We discussed the pros and cons of ultrasound versus CT imaging. Since she has adenopathy both in her axilla and neck I recommended that we proceed with a CT scan of her neck and chest.  We did discuss the possibility of this being a lymphoma. Explained to the patient that more than likely this will require surgical biopsy. I explained why a needle guided biopsy was not yield sufficient tissue for the pathologist. We are going to go ahead and tentatively schedule the patient for a left axillary excisional lymph node biopsy. We discussed the risks and benefits of surgery including but not limited to bleeding, infection, injury to surrounding structures, hematoma formation, seroma formation, scarring, and possible need for additional procedures. We also talked about the risk of anesthesia as well as blood clot formation.  I explained that I would call her with results of her CT scan.  In the interim we will check a CMET and CBC  Mary Sella. Andrey Campanile, MD, FACS General, Bariatric, & Minimally Invasive Surgery [facility], [place]        [facility] 12/26/2011, 3:11 PM

## 2012-01-12 NOTE — Anesthesia Postprocedure Evaluation (Signed)
  Anesthesia Post-op Note  Patient: [name]  Procedure(s) Performed: Procedure(s) (LRB): AXILLARY LYMPH NODE BIOPSY (Left)  Patient Location: PACU  Anesthesia Type: General  Level of Consciousness: awake and alert   Airway and Oxygen Therapy: Patient Spontanous Breathing  Post-op Pain: mild  Post-op Assessment: Post-op Vital signs reviewed, Patient's Cardiovascular Status Stable, Respiratory Function Stable, Patent Airway and No signs of Nausea or vomiting  Post-op Vital Signs: stable  Complications: No apparent anesthesia complications

## 2012-01-16 ENCOUNTER — Telehealth (INDEPENDENT_AMBULATORY_CARE_PROVIDER_SITE_OTHER): Payer: Self-pay | Admitting: General Surgery

## 2012-01-16 NOTE — Telephone Encounter (Signed)
Patient discharged on Thursday 01/12/12, instructed to make a 2wk po, soonest available is 02/01/12, she is scheduled but didn't know if it would be ok to wait that long.  Also would like to know her test results, please call.

## 2012-01-17 ENCOUNTER — Telehealth (INDEPENDENT_AMBULATORY_CARE_PROVIDER_SITE_OTHER): Payer: Self-pay | Admitting: General Surgery

## 2012-01-17 ENCOUNTER — Telehealth (INDEPENDENT_AMBULATORY_CARE_PROVIDER_SITE_OTHER): Payer: Self-pay

## 2012-01-17 DIAGNOSIS — C8190 Hodgkin lymphoma, unspecified, unspecified site: Secondary | ICD-10-CM

## 2012-01-17 NOTE — Telephone Encounter (Signed)
Dr. Andrey Campanile, will you call this patient when able please?

## 2012-01-17 NOTE — Telephone Encounter (Signed)
Another phone message started, routed to Dr Andrey Campanile for him to call patient.

## 2012-01-17 NOTE — Telephone Encounter (Signed)
Patient's appt moved to Friday am 01/20/12 @ 8:45am. No answer on patient's phone, mailbox full.

## 2012-01-17 NOTE — Telephone Encounter (Signed)
Patient called wanting test results. Said she left 2 messages yesterday and didn't here anything. She wants you to call her today.

## 2012-01-17 NOTE — Telephone Encounter (Signed)
Spoke with pt and informed her of diagnosis of hodgkins' lymphoma. Informed her next step will be referall to [facility] and likely need for port a cath placement.  Pt verbalized understanding.   Mary Sella. Andrey Campanile, MD, FACS General, Bariatric, & Minimally Invasive Surgery [facility], [place]

## 2012-01-18 ENCOUNTER — Telehealth: Payer: Self-pay | Admitting: Oncology

## 2012-01-18 ENCOUNTER — Ambulatory Visit: Payer: Medicaid Other

## 2012-01-18 ENCOUNTER — Other Ambulatory Visit: Payer: Self-pay

## 2012-01-18 ENCOUNTER — Encounter: Payer: Self-pay | Admitting: Oncology

## 2012-01-18 ENCOUNTER — Ambulatory Visit (HOSPITAL_BASED_OUTPATIENT_CLINIC_OR_DEPARTMENT_OTHER): Payer: Medicaid Other | Admitting: Oncology

## 2012-01-18 ENCOUNTER — Ambulatory Visit: Payer: Medicaid Other | Admitting: Lab

## 2012-01-18 VITALS — BP 165/91 | HR 81 | Temp 98.2°F | Ht 66.75 in | Wt 215.8 lb

## 2012-01-18 DIAGNOSIS — C8190 Hodgkin lymphoma, unspecified, unspecified site: Secondary | ICD-10-CM

## 2012-01-18 DIAGNOSIS — L259 Unspecified contact dermatitis, unspecified cause: Secondary | ICD-10-CM

## 2012-01-18 DIAGNOSIS — I10 Essential (primary) hypertension: Secondary | ICD-10-CM

## 2012-01-18 DIAGNOSIS — L309 Dermatitis, unspecified: Secondary | ICD-10-CM

## 2012-01-18 DIAGNOSIS — E119 Type 2 diabetes mellitus without complications: Secondary | ICD-10-CM

## 2012-01-18 HISTORY — DX: Hodgkin lymphoma, unspecified, unspecified site: C81.90

## 2012-01-18 LAB — CBC WITH DIFFERENTIAL/PLATELET
BASO%: 0.7 % (ref 0.0–2.0)
MCHC: 33.8 g/dL (ref 31.5–36.0)
MONO#: 0.5 10*3/uL (ref 0.1–0.9)
RBC: 4.27 10*6/uL (ref 3.70–5.45)
RDW: 14.7 % — ABNORMAL HIGH (ref 11.2–14.5)
WBC: 4.8 10*3/uL (ref 3.9–10.3)
lymph#: 1.3 10*3/uL (ref 0.9–3.3)

## 2012-01-18 LAB — MORPHOLOGY
PLT EST: ADEQUATE
RBC Comments: NORMAL

## 2012-01-18 NOTE — Telephone Encounter (Signed)
Tried to call again, no answer. Mailbox full.

## 2012-01-18 NOTE — Telephone Encounter (Signed)
Added new pt appt w/JG for today @ 2 pm - urgent work in pt on her wasy (per amy m).

## 2012-01-18 NOTE — Telephone Encounter (Signed)
Gave pt appt for Ct/PET, pulmonary function test, and 2D echo. Pt is aware of MD visit in march 2013

## 2012-01-18 NOTE — Telephone Encounter (Signed)
Received call from Amy @ [facility] and called back. She has been unable to reach patient as well. I gave patient's work number. Made her aware CT scan was cancelled due to insurance denial. Made her aware to proceed with ordering whatever they need that we have no pending orders.

## 2012-01-19 ENCOUNTER — Encounter: Payer: Self-pay | Admitting: Oncology

## 2012-01-19 DIAGNOSIS — L309 Dermatitis, unspecified: Secondary | ICD-10-CM | POA: Insufficient documentation

## 2012-01-19 DIAGNOSIS — E119 Type 2 diabetes mellitus without complications: Secondary | ICD-10-CM

## 2012-01-19 DIAGNOSIS — I152 Hypertension secondary to endocrine disorders: Secondary | ICD-10-CM | POA: Insufficient documentation

## 2012-01-19 DIAGNOSIS — E1159 Type 2 diabetes mellitus with other circulatory complications: Secondary | ICD-10-CM | POA: Insufficient documentation

## 2012-01-19 DIAGNOSIS — I10 Essential (primary) hypertension: Secondary | ICD-10-CM

## 2012-01-19 HISTORY — DX: Dermatitis, unspecified: L30.9

## 2012-01-19 HISTORY — DX: Essential (primary) hypertension: I10

## 2012-01-19 HISTORY — DX: Type 2 diabetes mellitus without complications: E11.9

## 2012-01-19 NOTE — Progress Notes (Signed)
New Patient Hematology-Oncology Evaluation   [name] [id number] [birth year] 31 y.o. 01/19/2012  CC: Dr Milford Cage; Dr Gaynelle Adu;   Reason for referral: New diagnosis of classic Hodgkin's lymphoma   HPI:  Pleasant 31 year old woman who began to notice of painless swelling in her left axilla at the end of July 2012. She went to the emergency department for an evaluation and at that time they were more concerned with laboratory findings of new diabetes. She was referred to a primary care physician and started on oral agents. Per her history, the examining physician was not concerned about her left axillary swelling. This started to become much more pronounced over the next few months. She sought the attention of a new physician, Dr. Tyson Dense. He was very concerned with the left axillary adenopathy and referred her to a surgeon for a biopsy. An excisional biopsy was done by Dr. Andrey Campanile on February 14. Findings are consistent with classical Hodgkin's lymphoma. The lymphoma is CD20 positive, CD15 and CD30 positive. There are classic Reed-Sternberg cells and variants. She's had some hot flashes but no drenching night sweats. Appetite has been erratic and her weight has fallen from 245 pounds in July to 215 pounds. She had a fever of 103 when she initially presented to the ED in July. No subsequent fevers. She has developed insomnia.   PMH: Past Medical History  Diagnosis Date  . Hypertension: Since age 20 and refractory to treatment.    . Diabetes mellitus: New since July 2012 now on an oral agent but previous history of gestational diabetes with her pregnancy 4 years ago    . Asthma     seasonal  . Complication of anesthesia SLOW TO WAKE UP AFTER C SECTION  . Eczema   . Hodgkin lymphoma 01/18/2012  . Benign essential HTN 01/19/2012  . DM II (diabetes mellitus, type II), controlled 01/19/2012  .  01/19/2012    Past Surgical History  Procedure Date  . Dilation and curettage of  uterus   . Cesarean section     Allergies: Allergies  Allergen Reactions  . Penicillins Other (See Comments)    Childhood reaction    Medications: Medications Prior to Admission  Medication Sig Dispense Refill  . ergocalciferol (VITAMIN D2) 50000 UNITS capsule Take 50,000 Units by mouth every 7 (seven) days. Sunday      . hydrocortisone cream 1 % Apply 1 application topically 2 (two) times daily as needed. For eczema      . lisinopril-hydrochlorothiazide (PRINZIDE,ZESTORETIC) 20-12.5 MG per tablet Take 1 tablet by mouth daily.      . metFORMIN (GLUCOPHAGE) 500 MG tablet Take 500 mg by mouth 2 (two) times daily with a meal.        . naproxen (NAPROSYN) 500 MG tablet Take 500 mg by mouth daily as needed. For pain.      Marland Kitchen oxyCODONE-acetaminophen (ROXICET) 5-325 MG per tablet Take 1-2 tablets by mouth every 4 (four) hours as needed for pain.  40 tablet  0   No current facility-administered medications on file as of 01/18/2012.    Social History:   reports that she quit smoking about 5 years ago. She does not have any smokeless tobacco history on file. She reports that she does not drink alcohol or use illicit drugs.she is a single parent. She is currently in a steady relationship with another man who is not the father of her 52-year-old daughter. Her daughter is very precocious. She does not use alcohol. She does  not use any recreational drugs. 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  Family History: Family History  Problem Relation Age of Onset  . Diabetes Maternal Grandmother   . Heart disease Maternal Grandfather   Brother age 78 she is very close with. No other siblings. She is not very close to her father and doesn't know much about his health history. Her mother sounds like she had a perforated diverticulum and required a temporary colostomy.  Review of  Systems: Constitutional symptoms: See above HEENT: No sore throat intermittent mild headache Respiratory: No cough or dyspnea Cardiovascular:  No chest pain pressure or palpitations Gastrointestinal ROS: No abdominal pain change in bowel habit hematochezia or melena Genito-Urinary ROS: She is on birth control with a Mirena ring gynecologist is Dr.Delcambre Hematological and Lymphatic: See above Musculoskeletal: No muscle or bone pain Neurologic: Intermittent mild headache no change in vision; no paresthesias Dermatologic: Chronic eczema; recent burn from a curling iron which inadvertently touched her left pectoral area Remaining ROS negative.  Physical Exam: Blood pressure 165/91, pulse 81, temperature 98.2 F (36.8 C), temperature source Oral, height 5' 6.75" (1.695 m), weight 215 lb 12.8 oz (97.886 kg), last menstrual period 12/06/2011. Wt Readings from Last 3 Encounters:  01/18/12 215 lb 12.8 oz (97.886 kg)  01/06/12 217 lb 7 oz (98.629 kg)  12/26/11 221 lb 3.2 oz (100.336 kg)    General appearance: Healthy appearing African American woman Head: Normal Neck: Full range of motion Lymph nodes: Bulky bilateral cervical supraclavicular and axillary lymphadenopathy with  massive adenopathy on the left side Breasts: Not examined Lungs: Clear to auscultation resonant to percussion Heart: You have cardiac rhythm no murmur gallop or rub Abdominal: Soft nontender no mass no organomegaly GU: Not examined no inguinal adenopathy Extremities: No edema no calf tenderness Neurologic: Mental status intact cranial nerves intact pupils reactive to light optic discs sharp vessels normal no hemorrhage or exudate motor strength 5 over 5 reflexes 2+ symmetric sensation intact to vibration over the fingertips by tuning fork exam Skin: Superficial burn over the left pectoral area; and scattered areas of eczema with a prominent area over the right pectoral area    Lab Results: Lab Results  Component  Value Date   WBC 4.8 01/18/2012   HGB 12.6 01/18/2012   HCT 37.5 01/18/2012   MCV 87.8 01/18/2012   PLT 323 01/18/2012     Chemistry      Component Value Date/Time   NA 138 01/18/2012 1339   K 3.7 01/18/2012 1339   CL 101 01/18/2012 1339   CO2 27 01/18/2012 1339   BUN 13 01/18/2012 1339   CREATININE 0.82 01/18/2012 1339   CREATININE 0.67 10/08/2007 1250      Component Value Date/Time   CALCIUM 9.9 01/18/2012 1339   ALKPHOS 77 01/18/2012 1339   AST 16 01/18/2012 1339   ALT 14 01/18/2012 1339   BILITOT 0.4 01/18/2012 1339    LDH: 188, normal. ESR elevated at 36 mm  pro time upper normal 15 seconds   Pathology:  FINAL for Mary, French ([id number]) Patient Name: RONIYAH, LLORENS Accession #: [id number] DOB: 07-20-1981 Age: 37 Gender: F Client Name [facility] Collected Date: 01/12/2012 Received Date: 01/12/2012 Physician: Gaynelle Adu, MD Chart #: MRN # : [id number] Physician cc: Race: B Visit #: [id number] REPORT OF SURGICAL PATHOLOGY FINAL DIAGNOSIS Diagnosis Lymph node, biopsy, left  axillary - CLASSICAL HODGKIN'S LYMPHOMA. - SEE ONCOLOGY TABLE. Microscopic Comment LYMPHOMA Histologic type: Classical Hodgkins lymphoma. Grade (if applicable): N/A Flow cytometry: Not performed. Immunohistochemical stains: CD20, CD3, CD30, CD15 and LCA with appropriate controls. Touch preps/imprints: Small lymphocytes, eosinophils and large atypical multilobated lymphoid cells. Comments: The sections show partial effacement of the lymph node architecture by a diffuse polymorphous cellular infiltrate characterized by abundance of large mostly multilobated lymphoid cells with features of Reed-Sternberg cells and variants. This is admixed with small lymphocytes, histiocytes, and relative abundance of eosinophils. Definite nodularity or fibrosis are not seen. The background lymphoid tissue displays non specific hyperplastic changes. To further evaluate this process, immunohistochemical  stains were performed and show that the large atypical lymphoid cells are positive for CD30 and CD15 and negative for CD3, CD20 and LCA. The lymphoid cells in the background show a mixture of T and B-cells. The histologic and immunophenotypic features are compatible with classical Hodgkin's lymphoma, which is best subclassified as mixed cellularity type. (BNS:gt, 01/16/12) Guerry Bruin MD Pathologist, Electronic Signature (Case signed 01/16/2012) Intraoperative Diagnosis LEFT AXILLARY LYMPH NODES (FOUR), TOUCH PREPARATION: HEMATOLYMPHOID MATERIAL PRESENT. (RAH) Specimen Gross and Clinical Information Specimen(s) Obtained: 1L yomf 2ph node, biopsy, left axillary FINAL for Vaillancourt, Yamilka M ([id number]) Specimen Clinical Information Lymphadenopathy (las) Gross Received fresh are four possible soft lymph nodes ranging from 0.4 to 1.2 cm. There is also a 0.7 cm tan soft tissue. Touch preparations of the possible nodes are made. Two of the larger nodes are submitted in block A (one inked black and one inked blue). The two smaller nodes are submitted in block B (one inked black and one inked blue), with the additional portion of tissue also included in block B. A portion of possible lymphoid tissue is submitted in RPMI for possible flow cytometry. Two blocks. (SW:gt, 01/12/12) Stain(s) used in Diagnosis: The following stain(s) were used in diagnosing the case: CD 15, CD45, CD 3, CD 20, CD 30. The control(s) stained appropriately. Disclaimer Some of these immunohistochemical stains may have been developed and the performance characteristics determined by [facility]. Some may not have been cleared or approved by the U.S. Food and Drug Administration. The FDA has determined that such clearance or approval is not necessary. This test is used for clinical purposes. It should not be regarded as investigational or for research. This laboratory is certified under the Clinical Laboratory  Improvement Amendments of 1988 (CLIA-88) as qualified to perform high complexity clinical laboratory testing. Report signed out from the following location(s) [facility] [address],[place] [address].CLIA:[id number]., [facility] [address], [place], [place] [address]. CLIA #: [id number],  Radiological Studies: Left mammogram 01/11/2012 no breast mass     Impression and Plan: Classical Hodgkin's lymphoma. Clinical stage IIb.  Diagnosis, prognosis, available treatment options with potential side effects including impact on fertility discussed at length with the patient.  I will proceed with a complete staging evaluation with CT scan neck chest abdomen and pelvis, PET scan. Bone marrow biopsy based on results of PET scan. Recent data published last month in the Journal of clinical oncology looking at PET scans and evaluation of Hodgkin's disease involving bone marrow shows a very high negative predictive value if PET scan is negative. She will get a baseline echocardiogram to assess cardiac ejection fraction in anticipation of anthracycline based chemotherapy, and pulmonary function tests as a baseline in anticipation of bleomycin as part of her chemotherapy.  She will need at least some  chemotherapy number of cycles to be determined by final staging after scans are available. We reviewed the standard in this country and in [place] which is still a combination of Adriamycin, we'll mycin, vinblastine, and Dacarbazine. An alternative regimen used in [place] is BEACOPP which may have a slight advantage in overall response but is significantly more toxic and has a higher rate of in fertility then the ABVD regimen. We reviewed potential side effects of the ABVD regimen. She is already on birth control. We may need to use a total estrogen suppression with a LHRH agonist such as goserelin to protect her ovaries.  If she is fortunate enough to  only have stage II disease, then she will also receive involved field radiation. I will see her again as soon as her staging evaluation is complete. Her surgeon called me today and he will make arrangements to put a Port-A-Cath infusion device in next week        Levert Feinstein, MD 01/19/2012, 1:09 PM

## 2012-01-20 ENCOUNTER — Ambulatory Visit (HOSPITAL_COMMUNITY)
Admission: RE | Admit: 2012-01-20 | Discharge: 2012-01-20 | Disposition: A | Discharge status: Home / Self Care | Payer: Medicaid Other | Source: Ambulatory Visit | Attending: Oncology | Admitting: Oncology

## 2012-01-20 ENCOUNTER — Ambulatory Visit (INDEPENDENT_AMBULATORY_CARE_PROVIDER_SITE_OTHER): Payer: Medicaid Other | Admitting: General Surgery

## 2012-01-20 ENCOUNTER — Encounter (INDEPENDENT_AMBULATORY_CARE_PROVIDER_SITE_OTHER): Payer: Self-pay | Admitting: General Surgery

## 2012-01-20 ENCOUNTER — Other Ambulatory Visit: Payer: Self-pay | Admitting: Oncology

## 2012-01-20 VITALS — BP 142/70 | HR 71 | Temp 98.2°F | Ht 67.5 in | Wt 217.8 lb

## 2012-01-20 DIAGNOSIS — C8190 Hodgkin lymphoma, unspecified, unspecified site: Secondary | ICD-10-CM

## 2012-01-20 DIAGNOSIS — Z09 Encounter for follow-up examination after completed treatment for conditions other than malignant neoplasm: Secondary | ICD-10-CM

## 2012-01-20 LAB — PULMONARY FUNCTION TEST

## 2012-01-20 LAB — COMPREHENSIVE METABOLIC PANEL
ALT: 14 U/L (ref 0–35)
Alkaline Phosphatase: 77 U/L (ref 39–117)
Glucose, Bld: 119 mg/dL — ABNORMAL HIGH (ref 70–99)
Sodium: 138 mEq/L (ref 135–145)
Total Bilirubin: 0.4 mg/dL (ref 0.3–1.2)
Total Protein: 8.2 g/dL (ref 6.0–8.3)

## 2012-01-20 LAB — HEPATITIS PANEL, ACUTE
HCV Ab: NEGATIVE
Hep A IgM: NEGATIVE

## 2012-01-20 LAB — CMV IGM: CMV IgM: 0.47 (ref <0.90)

## 2012-01-20 LAB — PROTHROMBIN TIME: INR: 1.13 (ref <1.50)

## 2012-01-20 LAB — CYTOMEGALOVIRUS ANTIBODY, IGG: Cytomegalovirus Ab-IgG: 3.73 — ABNORMAL HIGH (ref <0.90)

## 2012-01-20 LAB — SEDIMENTATION RATE: Sed Rate: 36 mm/hr — ABNORMAL HIGH (ref 0–22)

## 2012-01-20 LAB — EPSTEIN-BARR VIRUS VCA, IGG: EBV VCA IgG: 3.21 {ISR} — ABNORMAL HIGH

## 2012-01-20 MED ORDER — ALBUTEROL SULFATE (5 MG/ML) 0.5% IN NEBU
2.5000 mg | INHALATION_SOLUTION | Freq: Once | RESPIRATORY_TRACT | Status: AC
  Administered 2012-01-20: 2.5 mg via RESPIRATORY_TRACT

## 2012-01-20 NOTE — Pre-Procedure Instructions (Signed)
20 MAKYNLI STILLS  01/20/2012   Your procedure is scheduled on:  01-25-12  Report to [facility] at 10:15 AM.  Call this number if you have problems the morning of surgery: [phone]   Remember:   Do not eat food:After Midnight.  May have clear liquids: up to 4 Hrs. before arrival.( 6 HRS BEFORE SURGERY)  Clear liquids include soda, tea, black coffee, apple or grape juice, broth.  Take these medicines the morning of surgery with A SIP OF WATER: oxycodone if needed   Do not wear jewelry, make-up or nail polish.  Do not wear lotions, powders, or perfumes.   Do not shave underarms or legs 48 hours prior to surgery (men may shave face)  Do not bring valuables to the hospital.  Contacts, dentures or bridgework may not be worn into surgery.  Leave suitcase in the car. After surgery it may be brought to your room.  For patients admitted to the hospital, checkout time is 11:00 AM the day of discharge.   Patients discharged the day of surgery will not be allowed to drive home.  Name and phone number of your driver:   Special Instructions: CHG Shower Use Special Wash: 1/2 bottle night before surgery and 1/2 bottle morning of surgery.   Please read over the following fact sheets that you were given: MRSA Information

## 2012-01-20 NOTE — Progress Notes (Signed)
Chief Complaint  Patient presents with  . Routine Post Op    Lt br lymph node biopsy    HISTORY: 31 year old [place] American female comes in today for her first postoperative visit. She underwent a left axillary lymph node biopsy on February 14 because of lymphadenopathy. Unfortunately her biopsy was consistent with Hodgkin's lymphoma. She is in the middle of her workup. However she will need IV access for chemotherapy.  She states that she has been doing well since surgery. She denies any fevers or chills. She denies any drainage from her incision. She states that she did have some swelling underneath her incision but it has gone down. She denies any numbness or tingling in her axilla   Past Medical History  Diagnosis Date  . Hypertension   . Diabetes mellitus   . Asthma     seasonal  . Complication of anesthesia SLOW TO WAKE UP AFTER C SECTION  . Eczema   . Hodgkin lymphoma 01/18/2012  . Benign essential HTN 01/19/2012  . DM II (diabetes mellitus, type II), controlled 01/19/2012  . Eczema 01/19/2012  . Cancer      Past Surgical History  Procedure Date  . Dilation and curettage of uterus   . Cesarean section   . Axillary lymph node biopsy 12/2011    left    Current Outpatient Prescriptions  Medication Sig Dispense Refill  . ergocalciferol (VITAMIN D2) 50000 UNITS capsule Take 50,000 Units by mouth every 7 (seven) days. Sunday      . hydrocortisone cream 1 % Apply 1 application topically 2 (two) times daily as needed. For eczema      . hydrOXYzine (ATARAX/VISTARIL) 25 MG tablet Take 25 mg by mouth 3 (three) times daily as needed.      Marland Kitchen lisinopril-hydrochlorothiazide (PRINZIDE,ZESTORETIC) 20-12.5 MG per tablet Take 1 tablet by mouth daily.      Marland Kitchen LORazepam (ATIVAN) 2 MG tablet Take 2 mg by mouth every 6 (six) hours as needed.      . metFORMIN (GLUCOPHAGE) 500 MG tablet Take 500 mg by mouth 2 (two) times daily with a meal.        . mometasone (ELOCON) 0.1 % ointment Apply 0.1 %  topically 2 (two) times daily.      . naproxen (NAPROSYN) 500 MG tablet Take 500 mg by mouth daily as needed. For pain.      Marland Kitchen ondansetron (ZOFRAN-ODT) 8 MG disintegrating tablet Take 8 mg by mouth every 8 (eight) hours as needed.      Marland Kitchen oxyCODONE-acetaminophen (ROXICET) 5-325 MG per tablet Take 1-2 tablets by mouth every 4 (four) hours as needed for pain.  40 tablet  0  . fluconazole (DIFLUCAN) 150 MG tablet Take 150 mg by mouth once.         Allergies  Allergen Reactions  . Penicillins Other (See Comments)    Childhood reaction     Family History  Problem Relation Age of Onset  . Diabetes Maternal Grandmother   . Heart disease Maternal Grandfather      History   Social History  . Marital Status: Single    Spouse Name: N/A    Number of Children: N/A  . Years of Education: N/A   Social History Main Topics  . Smoking status: Former Smoker    Quit date: 11/19/2006  . Smokeless tobacco: None  . Alcohol Use: No  . Drug Use: No  . Sexually Active: Yes    Birth Control/ Protection: IUD   Other  Topics Concern  . None   Social History Narrative  . None     REVIEW OF SYSTEMS - PERTINENT POSITIVES ONLY: See HPI  EXAM: Filed Vitals:   01/20/12 0845  BP: 142/70  Pulse: 71  Temp: 98.2 F (36.8 C)    GEN:  WD, WN AAF in nad HEENT: normocephalic; pupils equal and reactive; sclerae clear; dentition good; mucous membranes moist NECK:  ; symmetric on extension;  palpable anterior cervical lymphadenopathy; + supraclavicular masses; no tenderness CHEST: clear to auscultation bilaterally without rales, rhonchi, or wheezes; left axilla incision - c/d/i. Significant left axillary LAD CARDIAC: regular rate and rhythm without significant murmur; peripheral pulses are full EXT:  non-tender without edema; no deformity; symmetric strength NEURO: no gross focal deficits; no sign of tremor ABD:  Soft, nontender RECTAL: deferred  LABORATORY RESULTS: See Epic for most recent  results  RADIOLOGY RESULTS: See Epic or I-Site for most recent results  DATA REVIEWED: Path report, Dr Patsy Lager note  IMPRESSION: S/p Left axillary lymph node biopsy Hodgkins lymphoma   PLAN: Doing well s/p biopsy  Needs for port a cath for chemotherapy.  The patient was shown an example of a Port-A-Cath. We discussed the risk and benefits of Port-A-Cath insertion. We discussed the risk of bleeding, infection, injury to the great vessels, injury to the lung resulting in a pneumothorax, scarring, port site complications, anesthesia risks, blood clot formation, and the typical postoperative course.  We'll schedule the patient for Port-A-Cath insertion as soon as possible  Mary Sella. Andrey Campanile, MD, FACS General, Bariatric, & Minimally Invasive Surgery [facility]      Visit Diagnoses: 1. Postop check   2. Hodgkin lymphoma     Primary Care Physician: Dorrene German, MD, MD

## 2012-01-23 ENCOUNTER — Other Ambulatory Visit: Payer: Self-pay | Admitting: *Deleted

## 2012-01-23 ENCOUNTER — Other Ambulatory Visit: Payer: Medicaid Other

## 2012-01-23 DIAGNOSIS — C8190 Hodgkin lymphoma, unspecified, unspecified site: Secondary | ICD-10-CM

## 2012-01-24 ENCOUNTER — Other Ambulatory Visit: Payer: Self-pay | Admitting: Oncology

## 2012-01-24 ENCOUNTER — Telehealth (INDEPENDENT_AMBULATORY_CARE_PROVIDER_SITE_OTHER): Payer: Self-pay | Admitting: General Surgery

## 2012-01-24 ENCOUNTER — Ambulatory Visit (HOSPITAL_COMMUNITY)
Admission: RE | Admit: 2012-01-24 | Discharge: 2012-01-24 | Disposition: A | Discharge status: Home / Self Care | Payer: Medicaid Other | Source: Ambulatory Visit | Attending: Oncology | Admitting: Oncology

## 2012-01-24 ENCOUNTER — Encounter (HOSPITAL_COMMUNITY)
Admission: RE | Admit: 2012-01-24 | Discharge: 2012-01-24 | Disposition: A | Discharge status: Home / Self Care | Payer: Medicaid Other | Source: Ambulatory Visit | Attending: Oncology | Admitting: Oncology

## 2012-01-24 ENCOUNTER — Encounter (HOSPITAL_COMMUNITY): Payer: Self-pay

## 2012-01-24 DIAGNOSIS — C8190 Hodgkin lymphoma, unspecified, unspecified site: Secondary | ICD-10-CM

## 2012-01-24 DIAGNOSIS — R599 Enlarged lymph nodes, unspecified: Secondary | ICD-10-CM | POA: Insufficient documentation

## 2012-01-24 MED ORDER — FLUDEOXYGLUCOSE F - 18 (FDG) INJECTION
19.1000 | Freq: Once | INTRAVENOUS | Status: AC | PRN
  Administered 2012-01-24: 19.1 via INTRAVENOUS

## 2012-01-24 MED ORDER — IOHEXOL 300 MG/ML  SOLN
100.0000 mL | Freq: Once | INTRAMUSCULAR | Status: AC | PRN
  Administered 2012-01-24: 100 mL via INTRAVENOUS

## 2012-01-24 NOTE — Progress Notes (Signed)
  Echocardiogram 2D Echocardiogram has been performed.  Cathie Beams Deneen 01/24/2012, 10:25 AM

## 2012-01-24 NOTE — Telephone Encounter (Signed)
Called pt to confirm she was aware that surgery time had been moved up. Pt stated she was aware and would be at hospital at new time.  Mary Sella. Andrey Campanile, MD, FACS General, Bariatric, & Minimally Invasive Surgery [facility], [place]

## 2012-01-24 NOTE — Progress Notes (Signed)
Patient notified of time change. She is to arrive 0800 for 1000 surgery 01/25/12 to [facility] short stay. She verbalizes understanding.

## 2012-01-25 ENCOUNTER — Ambulatory Visit (HOSPITAL_COMMUNITY)
Admission: RE | Admit: 2012-01-25 | Discharge: 2012-01-25 | Disposition: A | Discharge status: Home / Self Care | Payer: Medicaid Other | Source: Ambulatory Visit | Attending: General Surgery | Admitting: General Surgery

## 2012-01-25 ENCOUNTER — Ambulatory Visit (HOSPITAL_COMMUNITY): Payer: Medicaid Other | Admitting: Anesthesiology

## 2012-01-25 ENCOUNTER — Ambulatory Visit (HOSPITAL_COMMUNITY): Payer: Medicaid Other

## 2012-01-25 ENCOUNTER — Encounter (HOSPITAL_COMMUNITY)
Admission: RE | Disposition: A | Discharge status: Home / Self Care | Payer: Self-pay | Source: Ambulatory Visit | Attending: General Surgery

## 2012-01-25 ENCOUNTER — Encounter (HOSPITAL_COMMUNITY): Payer: Self-pay | Admitting: Anesthesiology

## 2012-01-25 ENCOUNTER — Encounter (HOSPITAL_COMMUNITY): Payer: Self-pay | Admitting: *Deleted

## 2012-01-25 DIAGNOSIS — C8190 Hodgkin lymphoma, unspecified, unspecified site: Secondary | ICD-10-CM

## 2012-01-25 DIAGNOSIS — E119 Type 2 diabetes mellitus without complications: Secondary | ICD-10-CM | POA: Insufficient documentation

## 2012-01-25 DIAGNOSIS — I10 Essential (primary) hypertension: Secondary | ICD-10-CM | POA: Insufficient documentation

## 2012-01-25 DIAGNOSIS — C8194 Hodgkin lymphoma, unspecified, lymph nodes of axilla and upper limb: Secondary | ICD-10-CM | POA: Insufficient documentation

## 2012-01-25 DIAGNOSIS — Z87891 Personal history of nicotine dependence: Secondary | ICD-10-CM | POA: Insufficient documentation

## 2012-01-25 DIAGNOSIS — Z79899 Other long term (current) drug therapy: Secondary | ICD-10-CM | POA: Insufficient documentation

## 2012-01-25 HISTORY — PX: PORTACATH PLACEMENT: SHX2246

## 2012-01-25 LAB — SURGICAL PCR SCREEN
MRSA, PCR: NEGATIVE
Staphylococcus aureus: NEGATIVE

## 2012-01-25 LAB — GLUCOSE, CAPILLARY: Glucose-Capillary: 124 mg/dL — ABNORMAL HIGH (ref 70–99)

## 2012-01-25 SURGERY — INSERTION, TUNNELED CENTRAL VENOUS DEVICE, WITH PORT
Anesthesia: General | Site: Chest | Laterality: Right | Wound class: Clean

## 2012-01-25 MED ORDER — OXYCODONE-ACETAMINOPHEN 5-325 MG PO TABS: 1.0000 | ORAL_TABLET | ORAL | Status: AC | PRN

## 2012-01-25 MED ORDER — ACETAMINOPHEN 10 MG/ML IV SOLN
INTRAVENOUS | Status: AC
  Filled 2012-01-25: qty 100

## 2012-01-25 MED ORDER — MEPERIDINE HCL 50 MG/ML IJ SOLN: 6.2500 mg | INTRAMUSCULAR | Status: DC | PRN

## 2012-01-25 MED ORDER — SODIUM CHLORIDE 0.9 % IV SOLN: 250.0000 mL | INTRAVENOUS | Status: DC | PRN

## 2012-01-25 MED ORDER — LIDOCAINE HCL (CARDIAC) 10 MG/ML IV SOLN
INTRAVENOUS | Status: DC | PRN
  Administered 2012-01-25: 100 mg via INTRAVENOUS

## 2012-01-25 MED ORDER — LACTATED RINGERS IV SOLN: INTRAVENOUS | Status: DC

## 2012-01-25 MED ORDER — PROMETHAZINE HCL 25 MG/ML IJ SOLN: 6.2500 mg | INTRAMUSCULAR | Status: DC | PRN

## 2012-01-25 MED ORDER — BUPIVACAINE-EPINEPHRINE 0.25% -1:200000 IJ SOLN
INTRAMUSCULAR | Status: DC | PRN
  Administered 2012-01-25: 25 mL

## 2012-01-25 MED ORDER — MUPIROCIN 2 % EX OINT
TOPICAL_OINTMENT | CUTANEOUS | Status: AC
  Filled 2012-01-25: qty 22

## 2012-01-25 MED ORDER — CIPROFLOXACIN IN D5W 400 MG/200ML IV SOLN
INTRAVENOUS | Status: AC
  Filled 2012-01-25: qty 200

## 2012-01-25 MED ORDER — PROPOFOL 10 MG/ML IV BOLUS
INTRAVENOUS | Status: DC | PRN
  Administered 2012-01-25: 200 mg via INTRAVENOUS

## 2012-01-25 MED ORDER — ONDANSETRON HCL 4 MG/2ML IJ SOLN
INTRAMUSCULAR | Status: DC | PRN
  Administered 2012-01-25: 4 mg via INTRAVENOUS

## 2012-01-25 MED ORDER — FENTANYL CITRATE 0.05 MG/ML IJ SOLN: 25.0000 ug | INTRAMUSCULAR | Status: DC | PRN

## 2012-01-25 MED ORDER — FENTANYL CITRATE 0.05 MG/ML IJ SOLN
INTRAMUSCULAR | Status: DC | PRN
  Administered 2012-01-25: 50 ug via INTRAVENOUS
  Administered 2012-01-25: 100 ug via INTRAVENOUS

## 2012-01-25 MED ORDER — SODIUM CHLORIDE 0.9 % IJ SOLN: 3.0000 mL | INTRAMUSCULAR | Status: DC | PRN

## 2012-01-25 MED ORDER — OXYCODONE HCL 5 MG PO TABS: 5.0000 mg | ORAL_TABLET | ORAL | Status: DC | PRN

## 2012-01-25 MED ORDER — LACTATED RINGERS IV SOLN
INTRAVENOUS | Status: DC
  Administered 2012-01-25: 1000 mL via INTRAVENOUS
  Administered 2012-01-25: 10:00:00 via INTRAVENOUS

## 2012-01-25 MED ORDER — HEPARIN SOD (PORK) LOCK FLUSH 100 UNIT/ML IV SOLN
INTRAVENOUS | Status: AC
  Filled 2012-01-25: qty 5

## 2012-01-25 MED ORDER — SODIUM CHLORIDE 0.9 % IJ SOLN: 3.0000 mL | Freq: Two times a day (BID) | INTRAMUSCULAR | Status: DC

## 2012-01-25 MED ORDER — PROMETHAZINE HCL 25 MG/ML IJ SOLN: 12.5000 mg | Freq: Four times a day (QID) | INTRAMUSCULAR | Status: DC | PRN

## 2012-01-25 MED ORDER — MIDAZOLAM HCL 5 MG/5ML IJ SOLN
INTRAMUSCULAR | Status: DC | PRN
  Administered 2012-01-25: 2 mg via INTRAVENOUS

## 2012-01-25 MED ORDER — ACETAMINOPHEN 325 MG PO TABS: 650.0000 mg | ORAL_TABLET | ORAL | Status: DC | PRN

## 2012-01-25 MED ORDER — CIPROFLOXACIN IN D5W 400 MG/200ML IV SOLN
400.0000 mg | INTRAVENOUS | Status: AC
  Administered 2012-01-25: 400 mg via INTRAVENOUS

## 2012-01-25 MED ORDER — BUPIVACAINE-EPINEPHRINE PF 0.25-1:200000 % IJ SOLN
INTRAMUSCULAR | Status: AC
  Filled 2012-01-25: qty 30

## 2012-01-25 MED ORDER — SODIUM CHLORIDE 0.9 % IR SOLN
Status: DC | PRN
  Administered 2012-01-25: 10:00:00

## 2012-01-25 MED ORDER — ACETAMINOPHEN 650 MG RE SUPP
650.0000 mg | RECTAL | Status: DC | PRN
  Filled 2012-01-25: qty 1

## 2012-01-25 MED ORDER — HEPARIN SODIUM (PORCINE) 5000 UNIT/ML IJ SOLN
INTRAMUSCULAR | Status: DC
  Filled 2012-01-25: qty 1.2

## 2012-01-25 MED ORDER — ONDANSETRON HCL 4 MG/2ML IJ SOLN: 4.0000 mg | Freq: Four times a day (QID) | INTRAMUSCULAR | Status: DC | PRN

## 2012-01-25 MED ORDER — ACETAMINOPHEN 10 MG/ML IV SOLN
INTRAVENOUS | Status: DC | PRN
  Administered 2012-01-25: 1000 mg via INTRAVENOUS

## 2012-01-25 MED ORDER — HEPARIN SOD (PORK) LOCK FLUSH 100 UNIT/ML IV SOLN
INTRAVENOUS | Status: DC | PRN
  Administered 2012-01-25: 500 [IU] via INTRAVENOUS

## 2012-01-25 MED ORDER — DROPERIDOL 2.5 MG/ML IJ SOLN
INTRAMUSCULAR | Status: DC | PRN
  Administered 2012-01-25: .625 mg via INTRAVENOUS

## 2012-01-25 SURGICAL SUPPLY — 41 items
ADH SKN CLS APL DERMABOND .7 (GAUZE/BANDAGES/DRESSINGS) ×1
APL SKNCLS STERI-STRIP NONHPOA (GAUZE/BANDAGES/DRESSINGS)
BAG DECANTER FOR FLEXI CONT (MISCELLANEOUS) ×2 IMPLANT
BENZOIN TINCTURE PRP APPL 2/3 (GAUZE/BANDAGES/DRESSINGS) IMPLANT
BLADE SURG 15 STRL LF DISP TIS (BLADE) ×1 IMPLANT
BLADE SURG 15 STRL SS (BLADE) ×2
CLOTH BEACON ORANGE TIMEOUT ST (SAFETY) ×2 IMPLANT
DECANTER SPIKE VIAL GLASS SM (MISCELLANEOUS) ×2 IMPLANT
DERMABOND ADVANCED (GAUZE/BANDAGES/DRESSINGS) ×1
DERMABOND ADVANCED .7 DNX12 (GAUZE/BANDAGES/DRESSINGS) IMPLANT
DRAPE C-ARM 42X72 X-RAY (DRAPES) ×2 IMPLANT
DRAPE LAPAROSCOPIC ABDOMINAL (DRAPES) ×2 IMPLANT
DRAPE UTILITY XL STRL (DRAPES) ×2 IMPLANT
DRSG TEGADERM 6X8 (GAUZE/BANDAGES/DRESSINGS) IMPLANT
ELECT REM PT RETURN 9FT ADLT (ELECTROSURGICAL) ×2
ELECTRODE REM PT RTRN 9FT ADLT (ELECTROSURGICAL) ×1 IMPLANT
GAUZE SPONGE 4X4 16PLY XRAY LF (GAUZE/BANDAGES/DRESSINGS) ×2 IMPLANT
GLOVE BIO SURGEON STRL SZ7.5 (GLOVE) ×2 IMPLANT
GLOVE INDICATOR 8.0 STRL GRN (GLOVE) ×2 IMPLANT
GOWN STRL NON-REIN LRG LVL3 (GOWN DISPOSABLE) ×2 IMPLANT
GOWN STRL REIN XL XLG (GOWN DISPOSABLE) ×4 IMPLANT
KIT BASIN OR (CUSTOM PROCEDURE TRAY) ×2 IMPLANT
KIT POWER CATH 8FR (Catheter) ×1 IMPLANT
NDL HYPO 25X1 1.5 SAFETY (NEEDLE) IMPLANT
NEEDLE HYPO 22GX1.5 SAFETY (NEEDLE) ×2 IMPLANT
NEEDLE HYPO 25X1 1.5 SAFETY (NEEDLE) IMPLANT
NS IRRIG 1000ML POUR BTL (IV SOLUTION) ×2 IMPLANT
PACK BASIC VI WITH GOWN DISP (CUSTOM PROCEDURE TRAY) ×2 IMPLANT
PEN SKIN MARKING BROAD (MISCELLANEOUS) ×1 IMPLANT
PENCIL BUTTON HOLSTER BLD 10FT (ELECTRODE) ×2 IMPLANT
SPONGE GAUZE 4X4 12PLY (GAUZE/BANDAGES/DRESSINGS) IMPLANT
STRIP CLOSURE SKIN 1/2X4 (GAUZE/BANDAGES/DRESSINGS) IMPLANT
SUT MNCRL AB 4-0 PS2 18 (SUTURE) ×2 IMPLANT
SUT PROLENE 2 0 SH DA (SUTURE) ×2 IMPLANT
SUT VIC AB 3-0 SH 27 (SUTURE) ×2
SUT VIC AB 3-0 SH 27XBRD (SUTURE) ×1 IMPLANT
SYR BULB IRRIGATION 50ML (SYRINGE) IMPLANT
SYR CONTROL 10ML LL (SYRINGE) ×2 IMPLANT
SYRINGE 10CC LL (SYRINGE) ×2 IMPLANT
TOWEL OR 17X26 10 PK STRL BLUE (TOWEL DISPOSABLE) ×2 IMPLANT
powerport 8F ×1 IMPLANT

## 2012-01-25 NOTE — Interval H&P Note (Signed)
History and Physical Interval Note:  01/25/2012 9:11 AM  [name]  has presented today for surgery, with the diagnosis of need for iv access, hodgkins lymphoma  The various methods of treatment have been discussed with the patient and family. After consideration of risks, benefits and other options for treatment, the patient has consented to  Procedure(s) (LRB): INSERTION PORT-A-CATH (N/A) as a surgical intervention .  The patients' history has been reviewed, patient examined, no change in status, stable for surgery.  I have reviewed the patients' chart and labs.  Questions were answered to the patient's satisfaction.    Mary Sella. Andrey Campanile, MD, FACS General, Bariatric, & Minimally Invasive Surgery [facility] [place]   [facility]

## 2012-01-25 NOTE — H&P (View-Only) (Signed)
Chief Complaint  Patient presents with  . Routine Post Op    Lt br lymph node biopsy    HISTORY: 31-year-old African American female comes in today for her first postoperative visit. She underwent a left axillary lymph node biopsy on February 14 because of lymphadenopathy. Unfortunately her biopsy was consistent with Hodgkin's lymphoma. She is in the middle of her workup. However she will need IV access for chemotherapy.  She states that she has been doing well since surgery. She denies any fevers or chills. She denies any drainage from her incision. She states that she did have some swelling underneath her incision but it has gone down. She denies any numbness or tingling in her axilla   Past Medical History  Diagnosis Date  . Hypertension   . Diabetes mellitus   . Asthma     seasonal  . Complication of anesthesia SLOW TO WAKE UP AFTER C SECTION  . Eczema   . Hodgkin lymphoma 01/18/2012  . Benign essential HTN 01/19/2012  . DM II (diabetes mellitus, type II), controlled 01/19/2012  . Eczema 01/19/2012  . Cancer      Past Surgical History  Procedure Date  . Dilation and curettage of uterus   . Cesarean section   . Axillary lymph node biopsy 12/2011    left    Current Outpatient Prescriptions  Medication Sig Dispense Refill  . ergocalciferol (VITAMIN D2) 50000 UNITS capsule Take 50,000 Units by mouth every 7 (seven) days. Sunday      . hydrocortisone cream 1 % Apply 1 application topically 2 (two) times daily as needed. For eczema      . hydrOXYzine (ATARAX/VISTARIL) 25 MG tablet Take 25 mg by mouth 3 (three) times daily as needed.      . lisinopril-hydrochlorothiazide (PRINZIDE,ZESTORETIC) 20-12.5 MG per tablet Take 1 tablet by mouth daily.      . LORazepam (ATIVAN) 2 MG tablet Take 2 mg by mouth every 6 (six) hours as needed.      . metFORMIN (GLUCOPHAGE) 500 MG tablet Take 500 mg by mouth 2 (two) times daily with a meal.        . mometasone (ELOCON) 0.1 % ointment Apply 0.1 %  topically 2 (two) times daily.      . naproxen (NAPROSYN) 500 MG tablet Take 500 mg by mouth daily as needed. For pain.      . ondansetron (ZOFRAN-ODT) 8 MG disintegrating tablet Take 8 mg by mouth every 8 (eight) hours as needed.      . oxyCODONE-acetaminophen (ROXICET) 5-325 MG per tablet Take 1-2 tablets by mouth every 4 (four) hours as needed for pain.  40 tablet  0  . fluconazole (DIFLUCAN) 150 MG tablet Take 150 mg by mouth once.         Allergies  Allergen Reactions  . Penicillins Other (See Comments)    Childhood reaction     Family History  Problem Relation Age of Onset  . Diabetes Maternal Grandmother   . Heart disease Maternal Grandfather      History   Social History  . Marital Status: Single    Spouse Name: N/A    Number of Children: N/A  . Years of Education: N/A   Social History Main Topics  . Smoking status: Former Smoker    Quit date: 11/19/2006  . Smokeless tobacco: None  . Alcohol Use: No  . Drug Use: No  . Sexually Active: Yes    Birth Control/ Protection: IUD   Other   Topics Concern  . None   Social History Narrative  . None     REVIEW OF SYSTEMS - PERTINENT POSITIVES ONLY: See HPI  EXAM: Filed Vitals:   01/20/12 0845  BP: 142/70  Pulse: 71  Temp: 98.2 F (36.8 C)    GEN:  WD, WN AAF in nad HEENT: normocephalic; pupils equal and reactive; sclerae clear; dentition good; mucous membranes moist NECK:  ; symmetric on extension;  palpable anterior cervical lymphadenopathy; + supraclavicular masses; no tenderness CHEST: clear to auscultation bilaterally without rales, rhonchi, or wheezes; left axilla incision - c/d/i. Significant left axillary LAD CARDIAC: regular rate and rhythm without significant murmur; peripheral pulses are full EXT:  non-tender without edema; no deformity; symmetric strength NEURO: no gross focal deficits; no sign of tremor ABD:  Soft, nontender RECTAL: deferred  LABORATORY RESULTS: See Epic for most recent  results  RADIOLOGY RESULTS: See Epic or I-Site for most recent results  DATA REVIEWED: Path report, Dr Granfortuna's note  IMPRESSION: S/p Left axillary lymph node biopsy Hodgkins lymphoma   PLAN: Doing well s/p biopsy  Needs for port a cath for chemotherapy.  The patient was shown an example of a Port-A-Cath. We discussed the risk and benefits of Port-A-Cath insertion. We discussed the risk of bleeding, infection, injury to the great vessels, injury to the lung resulting in a pneumothorax, scarring, port site complications, anesthesia risks, blood clot formation, and the typical postoperative course.  We'll schedule the patient for Port-A-Cath insertion as soon as possible  Fahima Cifelli M. Barney Gertsch, MD, FACS General, Bariatric, & Minimally Invasive Surgery [facility]      Visit Diagnoses: 1. Postop check   2. Hodgkin lymphoma     Primary Care Physician: AVBUERE,EDWIN A, MD, MD  

## 2012-01-25 NOTE — Anesthesia Postprocedure Evaluation (Signed)
  Anesthesia Post-op Note  Patient: [name]  Procedure(s) Performed: Procedure(s) (LRB): INSERTION PORT-A-CATH (Right)  Patient Location: PACU  Anesthesia Type: General  Level of Consciousness: awake and alert   Airway and Oxygen Therapy: Patient Spontanous Breathing  Post-op Pain: mild  Post-op Assessment: Post-op Vital signs reviewed, Patient's Cardiovascular Status Stable, Respiratory Function Stable, Patent Airway and No signs of Nausea or vomiting  Post-op Vital Signs: stable  Complications: No apparent anesthesia complications

## 2012-01-25 NOTE — Transfer of Care (Signed)
Immediate Anesthesia Transfer of Care Note  Patient: [name]  Procedure(s) Performed: Procedure(s) (LRB): INSERTION PORT-A-CATH (Right)  Patient Location: PACU  Anesthesia Type: General  Level of Consciousness: awake, alert , oriented and patient cooperative  Airway & Oxygen Therapy: Patient Spontanous Breathing and Patient connected to face mask oxygen  Post-op Assessment: Report given to PACU RN and Post -op Vital signs reviewed and stable  Post vital signs: stable  Complications: No apparent anesthesia complications

## 2012-01-25 NOTE — Op Note (Signed)
[name] [birth year] [id number] 01/20/2012  Preoperative diagnosis: PAC needed, Hodgkin's lymphoma  Postoperative diagnosis: Same  Procedure: Portacath Placement  Surgeon: Mary Sella. Andrey Campanile, MD, FACS  Anesthesia: GA combined with regional for post-op pain  Clinical History and Indications: The patient is getting ready to begin chemotherapy for her lymphoma. She  needs a Port-A-Cath for venous access.  Description of Procedure: I have seen the patient in the holding area and confirmed the plans for the procedure as noted above. I reviewed the risks and complications again and the patient has no further questions.  The patient was then taken to the operating room. After satisfactory general LMA anesthesia had been obtained the upper chest and lower neck were prepped and draped as a sterile field. The timeout was done. She received preoperative antibiotics.  The right subclavian vein was entered and the guidewire threaded into the superior vena cava right atrial area under fluoroscopic guidance. An incision was then made on the anterior chest wall and a subcutaneous pocket fashioned for the port reservoir after local had been infiltrated.  The port tubing was then brought through a subcutaneous tunnel from the port site to the guidewire site. The dilator and peel-away sheath were then advanced over the guidewire while monitoring this with fluoroscopy. The guidewire and dilator were removed and the tubing threaded to approximately 22 cm. The peel-away sheath was then removed. The catheter aspirated and flushed easily. Using fluoroscopy the tip was backed out into the superior vena cava right atrial junction area. It aspirated and flushed easily. The reservoir was attached and the locking mechanism engaged. That aspirated and flushed easily.  The reservoir was secured to the fascia with 2 sutures of 2-0 Prolene. A final check with fluoroscopy was done to make sure we had no kinks and good  positioning of the tip of the catheter. Everything appeared to be okay. The catheter was aspirated, flushed with dilute heparin and then concentrated aqueous heparin.  The incision was then closed with interrupted 3-0 Vicryl, and 4-0 Monocryl subcuticular with Dermabond on the skin.  There were no operative complications. Estimated blood loss was minimal. All counts were correct. The patient tolerated the procedure well.   Atilano Ina, MD, FACS 01/25/2012 11:10 AM

## 2012-01-25 NOTE — Transfer of Care (Signed)
Immediate Anesthesia Transfer of Care Note  Patient: [name]  Procedure(s) Performed: Procedure(s) (LRB): INSERTION PORT-A-CATH (Right)  Patient Location: PACU  Anesthesia Type: General  Level of Consciousness: awake, alert , oriented and patient cooperative  Airway & Oxygen Therapy: Patient Spontanous Breathing and Patient connected to face mask oxygen  Post-op Assessment: Report given to PACU RN and Post -op Vital signs reviewed and stable  Post vital signs: stable  Complications: No apparent anesthesia complications 

## 2012-01-25 NOTE — Discharge Instructions (Signed)
[facility] Office Phone Number [phone]   POST OP INSTRUCTIONS  Always review your discharge instruction sheet given to you by the facility where your surgery was performed.  IF YOU HAVE DISABILITY OR FAMILY LEAVE FORMS, YOU MUST BRING THEM TO THE OFFICE FOR PROCESSING.  DO NOT GIVE THEM TO YOUR DOCTOR.  1. A prescription for pain medication may be given to you upon discharge.  Take your pain medication as prescribed, if needed.  If narcotic pain medicine is not needed, then you may take acetaminophen (Tylenol) or ibuprofen (Advil) as needed. 2. Take your usually prescribed medications unless otherwise directed 3. If you need a refill on your pain medication, please contact your pharmacy.  They will contact our office to request authorization.  Prescriptions will not be filled after 5pm or on week-ends. 4. You should eat very light the first 24 hours after surgery, such as soup, crackers, pudding, etc.  Resume your normal diet the day after surgery. 5. Most patients will experience some swelling and bruising in the chest.  Ice packs will help.  Swelling and bruising can take several days to resolve.  6. It is common to experience some constipation if taking pain medication after surgery.  Increasing fluid intake and taking a stool softener will usually help or prevent this problem from occurring.  A mild laxative (Milk of Magnesia or Miralax) should be taken according to package directions if there are no bowel movements after 48 hours. 7. If your surgeon used skin glue on the incision, you may shower in 24 hours.  The glue will flake off over the next 2-3 weeks.   8. ACTIVITIES:  You may resume regular daily activities (gradually increasing) beginning the next day.   You may have sexual intercourse when it is comfortable. a. You may drive when you no longer are taking prescription pain medication, you can comfortably wear a seatbelt, and you can safely maneuver your car and  apply brakes. b. RETURN TO WORK:  1-2 days You should see your doctor in the office for a follow-up appointment if there are concerns.  The medical oncologist will evaluate the incision site when you follow-up for your chemotherapy.  OTHER INSTRUCTIONS: No strenuous activity with Right upper arm for 1 week WHEN TO CALL YOUR DOCTOR: 1. Fever over 101.0 2. Nausea and/or vomiting. 3. Shortness of breath 4. Extreme swelling or bruising. 5. Continued bleeding from incision. 6. Increased pain, redness, or drainage from the incision.  The clinic staff is available to answer your questions during regular business hours.  Please don't hesitate to call and ask to speak to one of the nurses for clinical concerns.  If you have a medical emergency, go to the nearest emergency room or call 911.  A surgeon from [facility] is always on call at the hospital.  For further questions, please visit [email or web address]

## 2012-01-25 NOTE — Anesthesia Preprocedure Evaluation (Addendum)
Anesthesia Evaluation  Patient identified by MRN, date of birth, ID band Patient awake    Reviewed: Allergy & Precautions, H&P , NPO status , Patient's Chart, lab work & pertinent test results  History of Anesthesia Complications (+) AWARENESS UNDER ANESTHESIA  Airway Mallampati: II TM Distance: >3 FB Neck ROM: Full    Dental No notable dental hx.    Pulmonary neg pulmonary ROS, asthma (seasonal) ,  clear to auscultation  Pulmonary exam normal       Cardiovascular hypertension, Pt. on medications neg cardio ROS Regular Normal    Neuro/Psych Negative Neurological ROS  Negative Psych ROS   GI/Hepatic negative GI ROS, Neg liver ROS,   Endo/Other  Negative Endocrine ROSDiabetes mellitus-, Type 2  Renal/GU negative Renal ROS  Genitourinary negative   Musculoskeletal negative musculoskeletal ROS (+)   Abdominal   Peds negative pediatric ROS (+)  Hematology negative hematology ROS (+)   Anesthesia Other Findings   Reproductive/Obstetrics negative OB ROS                          Anesthesia Physical Anesthesia Plan  ASA: II  Anesthesia Plan: General   Post-op Pain Management:    Induction: Intravenous  Airway Management Planned: LMA  Additional Equipment:   Intra-op Plan:   Post-operative Plan: Extubation in OR  Informed Consent: I have reviewed the patients History and Physical, chart, labs and discussed the procedure including the risks, benefits and alternatives for the proposed anesthesia with the patient or authorized representative who has indicated his/her understanding and acceptance.   Dental advisory given  Plan Discussed with: CRNA  Anesthesia Plan Comments:        Anesthesia Quick Evaluation

## 2012-01-26 LAB — GLUCOSE, CAPILLARY

## 2012-01-27 ENCOUNTER — Encounter (HOSPITAL_COMMUNITY): Payer: Self-pay | Admitting: General Surgery

## 2012-01-30 ENCOUNTER — Other Ambulatory Visit: Payer: Self-pay

## 2012-01-30 ENCOUNTER — Ambulatory Visit (HOSPITAL_BASED_OUTPATIENT_CLINIC_OR_DEPARTMENT_OTHER): Payer: Medicaid Other | Admitting: Oncology

## 2012-01-30 VITALS — BP 136/90 | HR 75 | Temp 97.6°F | Ht 67.0 in | Wt 215.8 lb

## 2012-01-30 DIAGNOSIS — C8190 Hodgkin lymphoma, unspecified, unspecified site: Secondary | ICD-10-CM

## 2012-01-30 MED ORDER — LIDOCAINE-PRILOCAINE 2.5-2.5 % EX CREA: TOPICAL_CREAM | CUTANEOUS | Status: DC | PRN

## 2012-01-30 NOTE — Progress Notes (Signed)
Hematology and Oncology Follow Up Visit  [name] [id number] 1981/02/27 31 y.o. 01/30/2012 8:14 PM   Principle Diagnosis: Encounter Diagnoses  Name Primary?  . Hodgkin's disease   . Hodgkin lymphoma Yes     Interim History:   Short interim followup visit for this pleasant 31 year old woman recently diagnosed with classic Hodgkin's lymphoma. She presented with bulky and progressive left axillary lymphadenopathy with intermittent fevers, approximate 10 pound weight loss, but no night sweats. Initial symptoms and appearance of adenopathy occurred in July of 2012. Diagnostic biopsy of the left axillary lymph nodes not done until 01/12/2012. Please see my recent office evaluation dated 01/19/2012 for further details. Since that visit her staging evaluation has been completed. I was pleasantly surprised to find that she has no disease below the diaphragm on CT scan or PET. There is no bone marrow activity. This makes her a stage IIB. In view of the bulky disease and the symptoms, optimal treatment will be 4 cycles of ABVD chemotherapy followed by involved field radiation.  Medications: reviewed  Allergies:  Allergies  Allergen Reactions  . Penicillins Other (See Comments)    Childhood reaction    Review of Systems: No new symptoms since visit here 2 weeks ago.   Physical Exam: Blood pressure 136/90, pulse 75, temperature 97.6 F (36.4 C), temperature source Oral, height 5\' 7"  (1.702 m), weight 215 lb 12.8 oz (97.886 kg), last menstrual period 01/12/2012. Wt Readings from Last 3 Encounters:  01/30/12 215 lb 12.8 oz (97.886 kg)  01/25/12 215 lb (97.523 kg)  01/25/12 215 lb (97.523 kg)     General appearance: Well dressed woman in no distress HENNT:  Lymph nodes: Persistent by cervical, supraclavicular, and left axillary adenopathy. Single small deep node right axilla which is not seen on CT scan or PET scan Breasts: Lungs: Clear to auscultation resonant to  percussion Heart: Regular cardiac rhythm no murmur Abdomen: Extremities: No edema Vascular: Neurologic: Skin:  Lab Results: Lab Results  Component Value Date   WBC 4.8 01/18/2012   HGB 12.6 01/18/2012   HCT 37.5 01/18/2012   MCV 87.8 01/18/2012   PLT 323 01/18/2012     Chemistry      Component Value Date/Time   NA 138 01/18/2012 1339   K 3.7 01/18/2012 1339   CL 101 01/18/2012 1339   CO2 27 01/18/2012 1339   BUN 13 01/18/2012 1339   CREATININE 0.82 01/18/2012 1339   CREATININE 0.67 10/08/2007 1250      Component Value Date/Time   CALCIUM 9.9 01/18/2012 1339   ALKPHOS 77 01/18/2012 1339   AST 16 01/18/2012 1339   ALT 14 01/18/2012 1339   BILITOT 0.4 01/18/2012 1339       Impression and Plan:  Stage IIB Hodgkin's lymphoma Plan is to proceed with ABVD chemotherapy. Baseline echocardiogram showed a normal left ventricular ejection fraction 50-55%. There was mild hypokinesis and abnormal left ventricular relaxation, grade 1 diastolic dysfunction. She reminded me that she has had long-standing hypertension since age 47. I am concerned enough to want to add Zinecard as a premedication to preserve cardiac function and consider getting a cardiac MRI. In the near future. With respect to preserving ovarian function, although there is a low-grade of infertility on your of 5% with ABVD, I would like to give her Zoladex injections to suppress her menses and preserve her ovarian function. She is agreeable to this. I will use the 3 month preparation so she will only have to have to injections  for the entire course of her therapy. Injections are uncomfortable with large needles given subcutaneously in the abdomen.   CC:. Dr. Milford Cage; and Dr. Gaynelle Adu   Levert Feinstein, MD 3/4/20138:14 PM

## 2012-01-31 ENCOUNTER — Ambulatory Visit (HOSPITAL_BASED_OUTPATIENT_CLINIC_OR_DEPARTMENT_OTHER): Payer: Medicaid Other

## 2012-01-31 ENCOUNTER — Other Ambulatory Visit: Payer: Self-pay | Admitting: Oncology

## 2012-01-31 ENCOUNTER — Other Ambulatory Visit: Payer: Medicaid Other | Admitting: Lab

## 2012-01-31 VITALS — BP 171/98 | HR 85 | Temp 98.4°F

## 2012-01-31 DIAGNOSIS — Z5111 Encounter for antineoplastic chemotherapy: Secondary | ICD-10-CM

## 2012-01-31 DIAGNOSIS — C8190 Hodgkin lymphoma, unspecified, unspecified site: Secondary | ICD-10-CM

## 2012-01-31 MED ORDER — GOSERELIN ACETATE 10.8 MG ~~LOC~~ IMPL
10.8000 mg | DRUG_IMPLANT | SUBCUTANEOUS | Status: DC
  Administered 2012-01-31: 10.8 mg via SUBCUTANEOUS
  Filled 2012-01-31: qty 10.8

## 2012-02-01 ENCOUNTER — Encounter (INDEPENDENT_AMBULATORY_CARE_PROVIDER_SITE_OTHER): Payer: Medicaid Other | Admitting: General Surgery

## 2012-02-02 ENCOUNTER — Other Ambulatory Visit: Payer: Self-pay | Admitting: *Deleted

## 2012-02-02 ENCOUNTER — Ambulatory Visit: Payer: Medicaid Other

## 2012-02-02 DIAGNOSIS — C8190 Hodgkin lymphoma, unspecified, unspecified site: Secondary | ICD-10-CM

## 2012-02-02 MED ORDER — LIDOCAINE-PRILOCAINE 2.5-2.5 % EX CREA: TOPICAL_CREAM | CUTANEOUS | Status: DC | PRN

## 2012-02-03 ENCOUNTER — Ambulatory Visit (HOSPITAL_BASED_OUTPATIENT_CLINIC_OR_DEPARTMENT_OTHER): Payer: Medicaid Other

## 2012-02-03 ENCOUNTER — Other Ambulatory Visit: Payer: Medicaid Other | Admitting: Lab

## 2012-02-03 ENCOUNTER — Other Ambulatory Visit: Payer: Self-pay | Admitting: Oncology

## 2012-02-03 DIAGNOSIS — C8190 Hodgkin lymphoma, unspecified, unspecified site: Secondary | ICD-10-CM

## 2012-02-03 DIAGNOSIS — Z5111 Encounter for antineoplastic chemotherapy: Secondary | ICD-10-CM

## 2012-02-03 LAB — CBC WITH DIFFERENTIAL/PLATELET
BASO%: 0.8 % (ref 0.0–2.0)
Basophils Absolute: 0 10*3/uL (ref 0.0–0.1)
Eosinophils Absolute: 0.3 10*3/uL (ref 0.0–0.5)
HCT: 36.1 % (ref 34.8–46.6)
HGB: 12.3 g/dL (ref 11.6–15.9)
MONO#: 0.5 10*3/uL (ref 0.1–0.9)
NEUT#: 2.8 10*3/uL (ref 1.5–6.5)
NEUT%: 56.1 % (ref 38.4–76.8)
WBC: 4.9 10*3/uL (ref 3.9–10.3)
lymph#: 1.4 10*3/uL (ref 0.9–3.3)

## 2012-02-03 MED ORDER — SODIUM CHLORIDE 0.9 % IV SOLN
Freq: Once | INTRAVENOUS | Status: AC
  Administered 2012-02-03: 10:00:00 via INTRAVENOUS

## 2012-02-03 MED ORDER — SODIUM CHLORIDE 0.9 % IV SOLN
10.0000 [IU]/m2 | Freq: Once | INTRAVENOUS | Status: AC
  Administered 2012-02-03: 22 [IU] via INTRAVENOUS
  Filled 2012-02-03: qty 7.3

## 2012-02-03 MED ORDER — LORAZEPAM 2 MG/ML IJ SOLN
1.0000 mg | Freq: Once | INTRAMUSCULAR | Status: AC
  Administered 2012-02-03: 1 mg via INTRAVENOUS

## 2012-02-03 MED ORDER — VINBLASTINE SULFATE CHEMO INJECTION 1 MG/ML
6.0000 mg/m2 | Freq: Once | INTRAVENOUS | Status: AC
  Administered 2012-02-03: 13 mg via INTRAVENOUS
  Filled 2012-02-03: qty 13

## 2012-02-03 MED ORDER — SODIUM CHLORIDE 0.9 % IV SOLN
375.0000 mg/m2 | Freq: Once | INTRAVENOUS | Status: AC
  Administered 2012-02-03: 800 mg via INTRAVENOUS
  Filled 2012-02-03: qty 40

## 2012-02-03 MED ORDER — HEPARIN SOD (PORK) LOCK FLUSH 100 UNIT/ML IV SOLN
500.0000 [IU] | Freq: Once | INTRAVENOUS | Status: DC | PRN
  Filled 2012-02-03: qty 5

## 2012-02-03 MED ORDER — ONDANSETRON 16 MG/50ML IVPB (CHCC)
16.0000 mg | Freq: Once | INTRAVENOUS | Status: AC
  Administered 2012-02-03: 16 mg via INTRAVENOUS

## 2012-02-03 MED ORDER — DEXAMETHASONE SODIUM PHOSPHATE 4 MG/ML IJ SOLN
20.0000 mg | Freq: Once | INTRAMUSCULAR | Status: AC
  Administered 2012-02-03: 20 mg via INTRAVENOUS

## 2012-02-03 MED ORDER — DOXORUBICIN HCL CHEMO IV INJECTION 2 MG/ML
25.0000 mg/m2 | Freq: Once | INTRAVENOUS | Status: AC
  Administered 2012-02-03: 54 mg via INTRAVENOUS
  Filled 2012-02-03: qty 27

## 2012-02-03 MED ORDER — SODIUM CHLORIDE 0.9 % IJ SOLN
10.0000 mL | INTRAMUSCULAR | Status: DC | PRN
  Filled 2012-02-03: qty 10

## 2012-02-03 NOTE — Patient Instructions (Signed)
[facility]   CHEMOTHERAPY INSTRUCTIONS   POTENTIAL SIDE EFFECTS OF TREATMENT:     SELF IMAGE NEEDS AND REFERRALS MADE:     EDUCATIONAL MATERIALS GIVEN AND REVIEWED:     SELF CARE ACTIVITIES WHILE ON CHEMOTHERAPY:     MEDICATIONS: You have been given prescriptions for the following medications:  Zofran    Other Medication Instructions: Adriamycin, Bleomycin, DTIC, Velban  SYMPTOMS TO REPORT AS SOON AS POSSIBLE AFTER TREATMENT:  FEVER GREATER THAN 100.5 F  CHILLS WITH OR WITHOUT FEVER  NAUSEA AND VOMITING THAT IS NOT CONTROLLED WITH YOUR NAUSEA MEDICATION  UNUSUAL SHORTNESS OF BREATH  UNUSUAL BRUISING OR BLEEDING  TENDERNESS IN MOUTH AND THROAT WITH OR WITHOUT PRESENCE OF ULCERS  URINARY PROBLEMS  BOWEL PROBLEMS  UNUSUAL RASH    Wear comfortable clothing and clothing appropriate for easy access to any Portacath or PICC line. Let us know if there is anything that we can do to make your therapy better!      I have been informed and understand all of the instructions given to me and have received a copy. I have been instructed to call the clinic [phone]  or my family physician as soon as possible for continued medical care, if indicated. I do not have any more questions at this time but understand that I may call the Cancer Center at [phone] during office hours should I have questions or need assistance in obtaining follow-up care.      _________________________________________      _______________     __________ Signature of Patient or Authorized Representative        Date                            Time      _________________________________________ Nurse's Signature

## 2012-02-06 ENCOUNTER — Telehealth: Payer: Self-pay | Admitting: *Deleted

## 2012-02-06 NOTE — Telephone Encounter (Signed)
Message copied by Augusto Garbe on Mon Feb 06, 2012 11:02 AM ------      Message from: Kallie Locks      Created: Fri Feb 03, 2012 11:26 AM      Regarding: "1st time chemotherapy"      Contact: [phone]       Patient received Adriamycin, Velban, Bleomycin and DTIC today, per Dr. Cyndie Chime.

## 2012-02-06 NOTE — Telephone Encounter (Signed)
[name] reports feeling weak because she is not eating as much as she normally does.  Stomach has been upset.  Taking antiemetics and no emesis.  Reports she is drinking fluids.  Suggested she try smoothies with yogurt or milk or ensure or boost supplements.  Reports not sleeping well due to stomach, back pain and hot flashes.  Suggested she try her lorazepam at bedtime.  No questions at this time.  Asked that she call if any changes in status.

## 2012-02-07 ENCOUNTER — Other Ambulatory Visit: Payer: Self-pay | Admitting: *Deleted

## 2012-02-07 DIAGNOSIS — C8190 Hodgkin lymphoma, unspecified, unspecified site: Secondary | ICD-10-CM

## 2012-02-07 MED ORDER — ALLOPURINOL 300 MG PO TABS: 300.0000 mg | ORAL_TABLET | Freq: Every day | ORAL | Status: DC

## 2012-02-07 MED ORDER — ZOLPIDEM TARTRATE 5 MG PO TABS: ORAL_TABLET | ORAL | Status: DC

## 2012-02-07 MED ORDER — LORAZEPAM 2 MG PO TABS: 2.0000 mg | ORAL_TABLET | Freq: Four times a day (QID) | ORAL | Status: DC | PRN

## 2012-02-07 MED ORDER — ONDANSETRON 8 MG PO TBDP: ORAL_TABLET | ORAL | Status: DC

## 2012-02-09 ENCOUNTER — Encounter: Payer: Self-pay | Admitting: Internal Medicine

## 2012-02-16 ENCOUNTER — Ambulatory Visit: Payer: Medicaid Other | Admitting: Nurse Practitioner

## 2012-02-16 ENCOUNTER — Ambulatory Visit: Payer: Medicaid Other

## 2012-02-17 ENCOUNTER — Ambulatory Visit (HOSPITAL_BASED_OUTPATIENT_CLINIC_OR_DEPARTMENT_OTHER): Payer: Medicaid Other

## 2012-02-17 ENCOUNTER — Other Ambulatory Visit: Payer: Medicaid Other | Admitting: Lab

## 2012-02-17 ENCOUNTER — Ambulatory Visit: Payer: Medicaid Other | Admitting: Nurse Practitioner

## 2012-02-17 ENCOUNTER — Ambulatory Visit (HOSPITAL_BASED_OUTPATIENT_CLINIC_OR_DEPARTMENT_OTHER): Payer: Medicaid Other | Admitting: Nurse Practitioner

## 2012-02-17 ENCOUNTER — Other Ambulatory Visit: Payer: Self-pay | Admitting: Oncology

## 2012-02-17 ENCOUNTER — Other Ambulatory Visit (HOSPITAL_BASED_OUTPATIENT_CLINIC_OR_DEPARTMENT_OTHER): Payer: Medicaid Other

## 2012-02-17 VITALS — BP 133/90 | HR 72 | Temp 97.9°F | Ht 67.0 in | Wt 222.8 lb

## 2012-02-17 DIAGNOSIS — C8190 Hodgkin lymphoma, unspecified, unspecified site: Secondary | ICD-10-CM

## 2012-02-17 DIAGNOSIS — E119 Type 2 diabetes mellitus without complications: Secondary | ICD-10-CM

## 2012-02-17 DIAGNOSIS — I10 Essential (primary) hypertension: Secondary | ICD-10-CM

## 2012-02-17 DIAGNOSIS — Z5111 Encounter for antineoplastic chemotherapy: Secondary | ICD-10-CM

## 2012-02-17 DIAGNOSIS — D702 Other drug-induced agranulocytosis: Secondary | ICD-10-CM

## 2012-02-17 LAB — COMPREHENSIVE METABOLIC PANEL
Albumin: 4 g/dL (ref 3.5–5.2)
BUN: 12 mg/dL (ref 6–23)
CO2: 25 mEq/L (ref 19–32)
Glucose, Bld: 119 mg/dL — ABNORMAL HIGH (ref 70–99)
Potassium: 4 mEq/L (ref 3.5–5.3)
Sodium: 139 mEq/L (ref 135–145)
Total Protein: 6.8 g/dL (ref 6.0–8.3)

## 2012-02-17 LAB — CBC WITH DIFFERENTIAL/PLATELET
Basophils Absolute: 0.1 10*3/uL (ref 0.0–0.1)
Eosinophils Absolute: 0.2 10*3/uL (ref 0.0–0.5)
HCT: 33.7 % — ABNORMAL LOW (ref 34.8–46.6)
HGB: 11.5 g/dL — ABNORMAL LOW (ref 11.6–15.9)
MCV: 86 fL (ref 79.5–101.0)
MONO%: 17.2 % — ABNORMAL HIGH (ref 0.0–14.0)
NEUT#: 0.6 10*3/uL — ABNORMAL LOW (ref 1.5–6.5)
NEUT%: 23.1 % — ABNORMAL LOW (ref 38.4–76.8)
RDW: 14.2 % (ref 11.2–14.5)
lymph#: 1.4 10*3/uL (ref 0.9–3.3)

## 2012-02-17 LAB — URIC ACID: Uric Acid, Serum: 4.5 mg/dL (ref 2.4–7.0)

## 2012-02-17 LAB — LACTATE DEHYDROGENASE: LDH: 110 U/L (ref 94–250)

## 2012-02-17 MED ORDER — DEXAMETHASONE SODIUM PHOSPHATE 4 MG/ML IJ SOLN
20.0000 mg | Freq: Once | INTRAMUSCULAR | Status: AC
  Administered 2012-02-17: 20 mg via INTRAVENOUS

## 2012-02-17 MED ORDER — SODIUM CHLORIDE 0.9 % IJ SOLN
10.0000 mL | INTRAMUSCULAR | Status: DC | PRN
  Administered 2012-02-17: 10 mL
  Filled 2012-02-17: qty 10

## 2012-02-17 MED ORDER — DOXORUBICIN HCL CHEMO IV INJECTION 2 MG/ML
25.0000 mg/m2 | Freq: Once | INTRAVENOUS | Status: AC
  Administered 2012-02-17: 54 mg via INTRAVENOUS
  Filled 2012-02-17: qty 27

## 2012-02-17 MED ORDER — SODIUM CHLORIDE 0.9 % IV SOLN
Freq: Once | INTRAVENOUS | Status: AC
  Administered 2012-02-17: 11:00:00 via INTRAVENOUS

## 2012-02-17 MED ORDER — ONDANSETRON 16 MG/50ML IVPB (CHCC)
16.0000 mg | Freq: Once | INTRAVENOUS | Status: AC
  Administered 2012-02-17: 16 mg via INTRAVENOUS

## 2012-02-17 MED ORDER — VINBLASTINE SULFATE CHEMO INJECTION 1 MG/ML
6.0000 mg/m2 | Freq: Once | INTRAVENOUS | Status: AC
  Administered 2012-02-17: 13 mg via INTRAVENOUS
  Filled 2012-02-17: qty 13

## 2012-02-17 MED ORDER — SODIUM CHLORIDE 0.9 % IV SOLN
375.0000 mg/m2 | Freq: Once | INTRAVENOUS | Status: AC
  Administered 2012-02-17: 800 mg via INTRAVENOUS
  Filled 2012-02-17: qty 40

## 2012-02-17 MED ORDER — SODIUM CHLORIDE 0.9 % IV SOLN
10.0000 [IU]/m2 | Freq: Once | INTRAVENOUS | Status: AC
  Administered 2012-02-17: 22 [IU] via INTRAVENOUS
  Filled 2012-02-17: qty 7.3

## 2012-02-17 MED ORDER — HEPARIN SOD (PORK) LOCK FLUSH 100 UNIT/ML IV SOLN
500.0000 [IU] | Freq: Once | INTRAVENOUS | Status: AC | PRN
  Administered 2012-02-17: 500 [IU]
  Filled 2012-02-17: qty 5

## 2012-02-17 NOTE — Progress Notes (Signed)
1030 OK to treat despite counts per Lonna Cobb, NP

## 2012-02-17 NOTE — Progress Notes (Signed)
OFFICE PROGRESS NOTE  Interval history:  [name] returns as scheduled. She completed cycle 1 day 1 ABVD 02/03/2012. She had a few episodes of nausea/vomiting. She also had a headache and back pain for a couple of days after the chemotherapy. 3 days after the chemotherapy she woke up during the night with abdominal pain, nausea and felt "hot". She experienced a similar occurrence two days later. No shortness of breath or cough. She denies fever or chills. No mouth sores. She has a good appetite. She denies numbness or tingling in her hands or feet. She has noted decreased swelling in the left axilla.   Objective: Blood pressure 133/90, pulse 72, temperature 97.9 F (36.6 C), temperature source Oral, height 5\' 7"  (1.702 m), weight 222 lb 12.8 oz (101.061 kg), last menstrual period 01/12/2012.  Oropharynx is without thrush or ulceration. 0.5cm left anterior cervical lymph node. Bilateral 1-2 cm supraclavicular lymph nodes. 2 cm left axillary lymph node. Thickness surrounding the left axillary scar. No definite adenopathy in the right axilla. Lungs clear. Regular cardiac rhythm. Port-A-Cath site is without erythema. Abdomen is soft and nontender. No organomegaly. Extremities are without edema. Motor strength 5 over 5. Knee DTRs 1+, symmetric.  Lab Results: Lab Results  Component Value Date   WBC 2.7* 02/17/2012   HGB 11.5* 02/17/2012   HCT 33.7* 02/17/2012   MCV 86.0 02/17/2012   PLT 240 02/17/2012    Chemistry:    Chemistry      Component Value Date/Time   NA 138 01/18/2012 1339   K 3.7 01/18/2012 1339   CL 101 01/18/2012 1339   CO2 27 01/18/2012 1339   BUN 13 01/18/2012 1339   CREATININE 0.82 01/18/2012 1339   CREATININE 0.67 10/08/2007 1250      Component Value Date/Time   CALCIUM 9.9 01/18/2012 1339   ALKPHOS 77 01/18/2012 1339   AST 16 01/18/2012 1339   ALT 14 01/18/2012 1339   BILITOT 0.4 01/18/2012 1339       Studies/Results: Ct Chest W Contrast  01/24/2012  *RADIOLOGY REPORT*   Clinical Data:  Staging Hodgkin's lymphoma  CT CHEST, ABDOMEN AND PELVIS WITH CONTRAST  Technique:  Multidetector CT imaging of the chest, abdomen and pelvis was performed following the standard protocol during bolus administration of intravenous contrast.  Contrast:  100 ml Omnipaque 300  Comparison:  PET CT scan 01/24/2012  CT CHEST  Findings:  There is a cluster of bulky left axillary lymph nodes. For example 2.7 cm lymph node (image 23).  Bulky supraclavicular lymphadenopathy on the left and right.  For example 3.1 cm short axis right supraclavicular lymph node.  No pathologic enlarged left axillary lymph nodes.  One lymph node in the left axilla was hypermetabolic on comparison PET CT scan.  In the mediastinum,  there is thickening within the anterior mediastinum (image 21). Right paratracheal lymph nodes are mildly enlarged 12 mm (image 18).  No pericardial fluid.  Review of the lung windows demonstrates no suspicious pulmonary nodules.  IMPRESSION:  1.  Bulky left axillary and supraclavicular lymphadenopathy consistent with Hodgkin's lymphoma. 2.  No pulmonary metastasis.  CT ABDOMEN AND PELVIS  Findings:  No focal hepatic lesion.  The gallbladder, pancreas, spleen, adrenal glands, and kidneys are normal.  The stomach, small bowel, appendix, and colon are normal.  Abdominal aorta is normal caliber.  No retroperitoneal or periportal lymphadenopathy.  No free fluid the pelvis.  IUD within the uterus.  The adnexa are normal.  No pelvic lymphadenopathy. Review  of  bone windows demonstrates no aggressive osseous lesions.  IMPRESSION: No evidence metastasis in the abdomen or pelvis.  Original Report Authenticated By: Genevive Bi, M.D.   Ct Abdomen Pelvis W Contrast  01/24/2012  *RADIOLOGY REPORT*  Clinical Data:  Staging Hodgkin's lymphoma  CT CHEST, ABDOMEN AND PELVIS WITH CONTRAST  Technique:  Multidetector CT imaging of the chest, abdomen and pelvis was performed following the standard protocol during  bolus administration of intravenous contrast.  Contrast:  100 ml Omnipaque 300  Comparison:  PET CT scan 01/24/2012  CT CHEST  Findings:  There is a cluster of bulky left axillary lymph nodes. For example 2.7 cm lymph node (image 23).  Bulky supraclavicular lymphadenopathy on the left and right.  For example 3.1 cm short axis right supraclavicular lymph node.  No pathologic enlarged left axillary lymph nodes.  One lymph node in the left axilla was hypermetabolic on comparison PET CT scan.  In the mediastinum,  there is thickening within the anterior mediastinum (image 21). Right paratracheal lymph nodes are mildly enlarged 12 mm (image 18).  No pericardial fluid.  Review of the lung windows demonstrates no suspicious pulmonary nodules.  IMPRESSION:  1.  Bulky left axillary and supraclavicular lymphadenopathy consistent with Hodgkin's lymphoma. 2.  No pulmonary metastasis.  CT ABDOMEN AND PELVIS  Findings:  No focal hepatic lesion.  The gallbladder, pancreas, spleen, adrenal glands, and kidneys are normal.  The stomach, small bowel, appendix, and colon are normal.  Abdominal aorta is normal caliber.  No retroperitoneal or periportal lymphadenopathy.  No free fluid the pelvis.  IUD within the uterus.  The adnexa are normal.  No pelvic lymphadenopathy. Review of  bone windows demonstrates no aggressive osseous lesions.  IMPRESSION: No evidence metastasis in the abdomen or pelvis.  Original Report Authenticated By: Genevive Bi, M.D.   Nm Pet Image Initial (pi) Skull Base To Thigh  01/24/2012  *RADIOLOGY REPORT*  Clinical Data: Initial treatment strategy for classic Hodgkin's lymphoma.  NUCLEAR MEDICINE PET SKULL BASE TO THIGH  Fasting Blood Glucose:  104  Technique:  19.1 mCi F-18 FDG was injected intravenously.   CT data was obtained and used for attenuation correction and anatomic localization only.  (This was not acquired as a diagnostic CT examination.) Additional exam technical data entered  on technologist  worksheet.  Comparison: CT scan to 26-1013  Findings:  Neck: There are bilateral hypermetabolic level II and level V lymph nodes as well as level III and level IV lymph nodes.  For example 13 mm short axis left level II lymph node (image 29) with SUV max = 11.0.  Right level III lymph node measuring 23 mm (image 49) with SUV max = 11.9.  There are bilateral bulky supraclavicular hypermetabolic lymph nodes.  Chest: There is bulky left axillary and sub pectoralis lymphadenopathy on the left.  For example cluster of 2 cm short axis lymph nodes (image 77) with SUV max = 8.1.  There is mild hypermetabolic active associate with the thickening in the anterior mediastinum (image 72).  There is mild hypermetabolic active associated with small paratracheal lymph nodes (image 67).  There is and additional small hypermetabolic lymph node in the left axilla which (image 61).   Abdomen / Pelvis:No abnormal hypermetabolic active within the spleen.  No hypermetabolic lymphadenopathy within the abdomen or pelvis.    IUD device in the uterus.  Skeleton:There is no clear abnormal hypermetabolic activity within the skeleton.  There are several foci of uptake along the costal  vertebral junction in the high left thoracic spine (images 58, 64, 69, 74 and 80.).  This uniform pattern suggests muscular activity, degenerative change, or potential brown fat activity.  No correlation on comparison CT portion of the exam.  IMPRESSION:  1.  Intensely hypermetabolic cervical and supraclavicular lymphadenopathy as well as bulky left axillary lymphadenopathy consistent with Hodgkin's lymphoma. 2.  Mild activity within the anterior mediastinum and lower paratracheal nodes as well as small right axillary nodes is also concerning for nodal metastasis. 3.  No evidence of abnormal active within the solid organs or activity below the hemidiaphragms.  The spleen is normal. 3.  No clear evidence of skeletal metastasis.  Original Report Authenticated By:  Genevive Bi, M.D.   Dg Chest Port 1 View  01/25/2012  *RADIOLOGY REPORT*  Clinical Data: Right Port-A-Cath placement.  PORTABLE CHEST - 1 VIEW  Comparison: CT chest 01/24/2012 and chest radiograph 01/06/2012.  Findings: Trachea is midline.  Heart size stable.  Right subclavian power port tip projects over the SVC.  No definite pneumothorax. Lungs are low in volume with bibasilar atelectasis.  No pleural fluid.  IMPRESSION:  1.  Right subclavian power port placement without complicating feature. 2.  Low lung volumes with bibasilar atelectasis.  Original Report Authenticated By: Reyes Ivan, M.D.   Dg C-arm 1-60 Min-no Report  01/25/2012  CLINICAL DATA: port placement   C-ARM 1-60 MINUTES  Fluoroscopy was utilized by the requesting physician.  No radiographic  interpretation.      Medications: I have reviewed the patient's current medications.  Assessment/Plan:  1. Stage IIB Hodgkin's lymphoma presenting with a bulky left axillary lymphadenopathy with intermittent fevers, weight loss. Status post biopsy of left axillary adenopathy 01/12/2012 with pathology showing classical Hodgkin's lymphoma. Staging PET scan on 01/24/2012 showed intensely hypermetabolic cervical and supraclavicular lymphadenopathy as well as bulky left axillary lymphadenopathy. There was mild activity within the anterior mediastinum and lower paratracheal nodes as well as small right axillary nodes. There was no evidence of abnormal activity within solid organs or activity below the hemidiaphragms. The spleen was normal. She began treatment with cycle 1 day 1 ABVD on 02/03/2012. 2. Status post Port-A-Cath placement 01/25/2012. 3. Neutropenia secondary to chemotherapy. She will receive Neulasta support 02/18/2012.  4. Diabetes. She continues metformin. 5. Hypertension.  Disposition-Ms. Pressman appears stable. She is neutropenic on labs today. Dr. Cyndie Chime recommends proceeding with cycle 1 day 15 ABVD as planned. She  will receive Neulasta support 02/18/2012. We reviewed potential toxicities associated with Neulasta including arthralgia, skin rash and splenic rupture. She will return for cycle 2 ABVD in 2 weeks. She has a followup visit with Dr. Cyndie Chime on 03/09/2012. She will contact the office in the interim with any problems. We specifically discussed fever, chills or other signs of infection.  Plan reviewed with Dr. Cyndie Chime.  Lonna Cobb ANP/GNP-BC

## 2012-02-18 ENCOUNTER — Ambulatory Visit (HOSPITAL_BASED_OUTPATIENT_CLINIC_OR_DEPARTMENT_OTHER): Payer: Medicaid Other

## 2012-02-18 DIAGNOSIS — C8190 Hodgkin lymphoma, unspecified, unspecified site: Secondary | ICD-10-CM

## 2012-02-18 MED ORDER — PEGFILGRASTIM INJECTION 6 MG/0.6ML
6.0000 mg | Freq: Once | SUBCUTANEOUS | Status: AC
  Administered 2012-02-18: 6 mg via SUBCUTANEOUS

## 2012-02-22 ENCOUNTER — Telehealth: Payer: Self-pay | Admitting: *Deleted

## 2012-02-22 NOTE — Telephone Encounter (Signed)
Received vm call this am from pt reporting that she woke up with a sore throat & has had a h/a for 3 days & wants to know what she can take.  Returned call & she reports no other symptoms except that her glands in her neck feel swollen & tender.  Suggested she take tylenol for now & will discuss with Dr. Cyndie Chime.  Her last treatment was 02/17/12 & she received neulasta 3/23.  Her WBC/ANC were 2.7/0.6 day of treatment.  Suggested warm salt water gargles & cepacol lozenges to help with sore throat. Discussed with Dr. Cyndie Chime & he reports that a script for cipro was given to pt 02/17/12.  Verified with pt that she was taking this.  Reminded to call if temp greater than or equal to 100.5 or yellow/green secretions.  She expressed understanding.

## 2012-02-24 ENCOUNTER — Other Ambulatory Visit: Payer: Self-pay | Admitting: Oncology

## 2012-02-27 ENCOUNTER — Other Ambulatory Visit: Payer: Self-pay

## 2012-02-27 DIAGNOSIS — C8590 Non-Hodgkin lymphoma, unspecified, unspecified site: Secondary | ICD-10-CM

## 2012-02-27 MED ORDER — CIPROFLOXACIN HCL 500 MG PO TABS: 500.0000 mg | ORAL_TABLET | Freq: Two times a day (BID) | ORAL | Status: AC

## 2012-03-01 ENCOUNTER — Ambulatory Visit: Payer: Medicaid Other

## 2012-03-02 ENCOUNTER — Other Ambulatory Visit (HOSPITAL_BASED_OUTPATIENT_CLINIC_OR_DEPARTMENT_OTHER): Payer: Medicaid Other | Admitting: Lab

## 2012-03-02 ENCOUNTER — Ambulatory Visit (HOSPITAL_BASED_OUTPATIENT_CLINIC_OR_DEPARTMENT_OTHER): Payer: Medicaid Other

## 2012-03-02 DIAGNOSIS — Z5111 Encounter for antineoplastic chemotherapy: Secondary | ICD-10-CM

## 2012-03-02 DIAGNOSIS — C8190 Hodgkin lymphoma, unspecified, unspecified site: Secondary | ICD-10-CM

## 2012-03-02 DIAGNOSIS — R51 Headache: Secondary | ICD-10-CM

## 2012-03-02 LAB — CBC WITH DIFFERENTIAL/PLATELET
BASO%: 1.1 % (ref 0.0–2.0)
Eosinophils Absolute: 0.3 10*3/uL (ref 0.0–0.5)
MCHC: 35.5 g/dL (ref 31.5–36.0)
MONO#: 0.6 10*3/uL (ref 0.1–0.9)
NEUT#: 4.1 10*3/uL (ref 1.5–6.5)
RBC: 4.39 10*6/uL (ref 3.70–5.45)
WBC: 6.4 10*3/uL (ref 3.9–10.3)
lymph#: 1.4 10*3/uL (ref 0.9–3.3)

## 2012-03-02 MED ORDER — DOXORUBICIN HCL CHEMO IV INJECTION 2 MG/ML
25.0000 mg/m2 | Freq: Once | INTRAVENOUS | Status: AC
  Administered 2012-03-02: 54 mg via INTRAVENOUS
  Filled 2012-03-02: qty 27

## 2012-03-02 MED ORDER — SODIUM CHLORIDE 0.9 % IJ SOLN
10.0000 mL | INTRAMUSCULAR | Status: DC | PRN
  Administered 2012-03-02: 10 mL
  Filled 2012-03-02: qty 10

## 2012-03-02 MED ORDER — SODIUM CHLORIDE 0.9 % IV SOLN
375.0000 mg/m2 | Freq: Once | INTRAVENOUS | Status: AC
  Administered 2012-03-02: 800 mg via INTRAVENOUS
  Filled 2012-03-02: qty 40

## 2012-03-02 MED ORDER — ACETAMINOPHEN 325 MG PO TABS
650.0000 mg | ORAL_TABLET | Freq: Once | ORAL | Status: AC
  Administered 2012-03-02: 650 mg via ORAL

## 2012-03-02 MED ORDER — VINBLASTINE SULFATE CHEMO INJECTION 1 MG/ML
6.0000 mg/m2 | Freq: Once | INTRAVENOUS | Status: AC
  Administered 2012-03-02: 13 mg via INTRAVENOUS
  Filled 2012-03-02: qty 13

## 2012-03-02 MED ORDER — ONDANSETRON 16 MG/50ML IVPB (CHCC)
16.0000 mg | Freq: Once | INTRAVENOUS | Status: AC
  Administered 2012-03-02: 16 mg via INTRAVENOUS

## 2012-03-02 MED ORDER — HEPARIN SOD (PORK) LOCK FLUSH 100 UNIT/ML IV SOLN
500.0000 [IU] | Freq: Once | INTRAVENOUS | Status: AC | PRN
  Administered 2012-03-02: 500 [IU]
  Filled 2012-03-02: qty 5

## 2012-03-02 MED ORDER — SODIUM CHLORIDE 0.9 % IV SOLN
10.0000 [IU]/m2 | Freq: Once | INTRAVENOUS | Status: AC
  Administered 2012-03-02: 22 [IU] via INTRAVENOUS
  Filled 2012-03-02: qty 7.3

## 2012-03-02 MED ORDER — DEXAMETHASONE SODIUM PHOSPHATE 4 MG/ML IJ SOLN
20.0000 mg | Freq: Once | INTRAMUSCULAR | Status: AC
  Administered 2012-03-02: 20 mg via INTRAVENOUS

## 2012-03-02 MED ORDER — SODIUM CHLORIDE 0.9 % IV SOLN
Freq: Once | INTRAVENOUS | Status: AC
  Administered 2012-03-02: 12:00:00 via INTRAVENOUS

## 2012-03-02 NOTE — Patient Instructions (Signed)
[facility] Discharge Instructions for Patients Receiving Chemotherapy  Today you received the following chemotherapy agents Adriamycin / VinBlastine / Belocin / Dacarbazine To help prevent nausea and vomiting after your treatment, we encourage you to take your nausea medication as prescribed.   If you develop nausea and vomiting that is not controlled by your nausea medication, call the clinic. If it is after clinic hours your family physician or the after hours number for the clinic or go to the Emergency Department.   BELOW ARE SYMPTOMS THAT SHOULD BE REPORTED IMMEDIATELY:  *FEVER GREATER THAN 100.5 F  *CHILLS WITH OR WITHOUT FEVER  NAUSEA AND VOMITING THAT IS NOT CONTROLLED WITH YOUR NAUSEA MEDICATION  *UNUSUAL SHORTNESS OF BREATH  *UNUSUAL BRUISING OR BLEEDING  TENDERNESS IN MOUTH AND THROAT WITH OR WITHOUT PRESENCE OF ULCERS  *URINARY PROBLEMS  *BOWEL PROBLEMS  UNUSUAL RASH Items with * indicate a potential emergency and should be followed up as soon as possible.  One of the nurses will contact you 24 hours after your treatment. Please let the nurse know about any problems that you may have experienced. Feel free to call the clinic you have any questions or concerns. The clinic phone number is [phone].   I have been informed and understand all the instructions given to me. I know to contact the clinic, my physician, or go to the Emergency Department if any problems should occur. I do not have any questions at this time, but understand that I may call the clinic during office hours   should I have any questions or need assistance in obtaining follow up care.    __________________________________________  _____________  __________ Signature of Patient or Authorized Representative            Date                   Time    __________________________________________ Nurse's Signature

## 2012-03-05 ENCOUNTER — Encounter: Payer: Self-pay | Admitting: Oncology

## 2012-03-05 NOTE — Progress Notes (Signed)
Put [employer] form for daycare on nurse's desk.

## 2012-03-06 ENCOUNTER — Telehealth: Payer: Self-pay | Admitting: *Deleted

## 2012-03-06 ENCOUNTER — Encounter: Payer: Self-pay | Admitting: Oncology

## 2012-03-06 NOTE — Progress Notes (Signed)
Mailed daycare form back to [employer].

## 2012-03-06 NOTE — Telephone Encounter (Signed)
Pt called stating she was down to her last oxycodone & would like [place] to fax or call in a new script.  She reports back spasms & the oxycodone she had was given to her by Dr Andrey Campanile who put her port in & did her bx.  Discussed with Dr. Cyndie Chime & he didn't want to give a narcotic for back spasms & suggested flexeril 10 mg tid prn .  Message left for pt to return call to discuss.

## 2012-03-07 ENCOUNTER — Other Ambulatory Visit: Payer: Self-pay | Admitting: *Deleted

## 2012-03-07 DIAGNOSIS — C8190 Hodgkin lymphoma, unspecified, unspecified site: Secondary | ICD-10-CM

## 2012-03-07 MED ORDER — CYCLOBENZAPRINE HCL 10 MG PO TABS: 10.0000 mg | ORAL_TABLET | Freq: Three times a day (TID) | ORAL | Status: AC | PRN

## 2012-03-07 NOTE — Telephone Encounter (Signed)
Pt called yest requesting refill on her oxycodone for back spasms.  Per Dr. Cyndie Chime, pt needs flexeril instead of oxycodone.  Message was left for pt to call back but never heard from her & therefore called her again.  She was in agreement with flexeril & this was e-scribed.

## 2012-03-09 ENCOUNTER — Encounter: Payer: Self-pay | Admitting: Oncology

## 2012-03-09 ENCOUNTER — Telehealth: Payer: Self-pay | Admitting: Oncology

## 2012-03-09 ENCOUNTER — Ambulatory Visit (HOSPITAL_BASED_OUTPATIENT_CLINIC_OR_DEPARTMENT_OTHER): Payer: Medicaid Other | Admitting: Oncology

## 2012-03-09 VITALS — BP 155/107 | HR 84 | Temp 98.6°F | Ht 67.0 in | Wt 218.4 lb

## 2012-03-09 DIAGNOSIS — C8190 Hodgkin lymphoma, unspecified, unspecified site: Secondary | ICD-10-CM

## 2012-03-09 DIAGNOSIS — M62830 Muscle spasm of back: Secondary | ICD-10-CM | POA: Insufficient documentation

## 2012-03-09 DIAGNOSIS — I10 Essential (primary) hypertension: Secondary | ICD-10-CM

## 2012-03-09 DIAGNOSIS — E119 Type 2 diabetes mellitus without complications: Secondary | ICD-10-CM

## 2012-03-09 DIAGNOSIS — M62838 Other muscle spasm: Secondary | ICD-10-CM

## 2012-03-09 DIAGNOSIS — C8191 Hodgkin lymphoma, unspecified, lymph nodes of head, face, and neck: Secondary | ICD-10-CM

## 2012-03-09 HISTORY — DX: Muscle spasm of back: M62.830

## 2012-03-09 NOTE — Telephone Encounter (Signed)
Added inj 4/20 per 4/12 pof. S/w pt re add on.

## 2012-03-09 NOTE — Progress Notes (Signed)
Hematology and Oncology Follow Up Visit  [name] [id number] Jan 12, 1981 31 y.o. 03/09/2012 5:54 PM   Principle Diagnosis: Encounter Diagnoses  Name Primary?  . Hodgkin's disease of lymph nodes of head, face, and neck Yes  . Muscle spasm of back      Interim History:   Follow up visit for this pleasant 31 year old woman under active treatment for stage IIB bulky classic Hodgkin's lymphoma. She has now had 3 ABVD  treatments. She is experiencing delayed nausea. She had significant leukopenia with absolute granulocytopenia  following the first treatment. Neulasta was added on day 15 cycle 1. She has developed significant muscle spasm in her lower paraspinal area. I think this might be due to the Neulasta. I prescribed Flexeril and she took this with significant relief. She denies any oral mucosal ulcers. No dyspnea. No fever or infection. No paresthesias.   Medications: reviewed  Allergies:  Allergies  Allergen Reactions  . Penicillins Other (See Comments)    Childhood reaction    Review of Systems: Constitutional:   Constitutional symptoms of the [place] since starting chemotherapy Respiratory: See above Cardiovascular:  No chest pain or palpitations Gastrointestinal: No change in bowel habit Genito-Urinary: No urinary tract symptoms Musculoskeletal: See above Neurologic: No headache or change in vision Skin: No rash or ecchymosis Remaining ROS negative.  Physical Exam: Blood pressure 155/107, pulse 84, temperature 98.6 F (37 C), temperature source Oral, height 5\' 7"  (1.702 m), weight 218 lb 6.4 oz (99.066 kg). Wt Readings from Last 3 Encounters:  03/09/12 218 lb 6.4 oz (99.066 kg)  02/17/12 222 lb 12.8 oz (101.061 kg)  01/30/12 215 lb 12.8 oz (97.886 kg)     General appearance: Well-nourished young woman HENNT: Pharynx no erythema exudate or ulcer Lymph nodes: Near-complete regression of previous bulky left cervical lymphadenopathy with some residual  subcentimeter nodes. Complete regression of previous bulky left axillary adenopathy. Breasts: Not examined Lungs: Clear to auscultation and resonant to percussion Heart: Regular rhythm no murmur Abdomen: Soft nontender no mass no organomegaly Extremities: No edema no calf tenderness Vascular: No cyanosis Neurologic: Motor strength 5 over 5 reflexes 1+ symmetric, sensation intact to vibration by tuning fork exam over the fingertips Skin: No rash or ecchymosis  Lab Results: Lab Results  Component Value Date   WBC 6.4 03/02/2012   HGB 13.2 03/02/2012   HCT 37.2 03/02/2012   MCV 84.7 03/02/2012   PLT 192 03/02/2012     Chemistry      Component Value Date/Time   NA 139 02/17/2012 0923   K 4.0 02/17/2012 0923   CL 106 02/17/2012 0923   CO2 25 02/17/2012 0923   BUN 12 02/17/2012 0923   CREATININE 0.82 02/17/2012 0923   CREATININE 0.67 10/08/2007 1250      Component Value Date/Time   CALCIUM 8.8 02/17/2012 0923   ALKPHOS 52 02/17/2012 0923   AST 14 02/17/2012 0923   ALT 17 02/17/2012 0923   BILITOT 0.4 02/17/2012 0923      Impression: #1. Stage IIB Hodgkin's lymphoma responding to treatment Plan: She will be restaged following 3 cycles (6 treatments) We discussed a radiation oncology consultation to see if she is a candidate for involved field radiation. Since disease across the midline, I am not sure that she will be a good candidate for radiation. If she is an acceptable radiation candidate, then I will plan on a total of 4 preradiation chemotherapy cycles.  #2. Muscle spasm probably secondary to Neulasta. She did get a nice  result with Flexeril and she will continue this on a when necessary basis. I also suggested that she use a nonsteroidal anti-inflammatory starting on the day of the Neulasta and for to to 3 days after.  #3. Type 2 diabetes  #4. Long-standing hypertension despite her young age  CC:.Dr. Gaynelle Adu; Dr. Laure Kidney, MD 4/12/20135:54 PM

## 2012-03-09 NOTE — Telephone Encounter (Signed)
PT APPTS PRINTED FOR PT

## 2012-03-14 ENCOUNTER — Other Ambulatory Visit: Payer: Self-pay | Admitting: Oncology

## 2012-03-15 ENCOUNTER — Ambulatory Visit: Payer: Medicaid Other

## 2012-03-15 ENCOUNTER — Telehealth: Payer: Self-pay | Admitting: Oncology

## 2012-03-15 NOTE — Telephone Encounter (Signed)
S/w the pt and she is aware of the appt with rad onc on 03/22/2012@2 :30pm

## 2012-03-16 ENCOUNTER — Other Ambulatory Visit (HOSPITAL_BASED_OUTPATIENT_CLINIC_OR_DEPARTMENT_OTHER): Payer: Medicaid Other | Admitting: Lab

## 2012-03-16 ENCOUNTER — Ambulatory Visit (HOSPITAL_BASED_OUTPATIENT_CLINIC_OR_DEPARTMENT_OTHER): Payer: Medicaid Other

## 2012-03-16 DIAGNOSIS — E119 Type 2 diabetes mellitus without complications: Secondary | ICD-10-CM

## 2012-03-16 DIAGNOSIS — C8190 Hodgkin lymphoma, unspecified, unspecified site: Secondary | ICD-10-CM

## 2012-03-16 DIAGNOSIS — Z5111 Encounter for antineoplastic chemotherapy: Secondary | ICD-10-CM

## 2012-03-16 LAB — CBC WITH DIFFERENTIAL/PLATELET
BASO%: 2.7 % — ABNORMAL HIGH (ref 0.0–2.0)
EOS%: 10.5 % — ABNORMAL HIGH (ref 0.0–7.0)
MCH: 30.2 pg (ref 25.1–34.0)
MCHC: 35.1 g/dL (ref 31.5–36.0)
MCV: 86 fL (ref 79.5–101.0)
MONO%: 14.7 % — ABNORMAL HIGH (ref 0.0–14.0)
RBC: 3.87 10*6/uL (ref 3.70–5.45)
RDW: 14.7 % — ABNORMAL HIGH (ref 11.2–14.5)
nRBC: 0 % (ref 0–0)

## 2012-03-16 MED ORDER — DEXAMETHASONE SODIUM PHOSPHATE 4 MG/ML IJ SOLN
20.0000 mg | Freq: Once | INTRAMUSCULAR | Status: AC
  Administered 2012-03-16: 20 mg via INTRAVENOUS

## 2012-03-16 MED ORDER — SODIUM CHLORIDE 0.9 % IJ SOLN
10.0000 mL | INTRAMUSCULAR | Status: DC | PRN
  Administered 2012-03-16: 10 mL
  Filled 2012-03-16: qty 10

## 2012-03-16 MED ORDER — VINBLASTINE SULFATE CHEMO INJECTION 1 MG/ML
6.0000 mg/m2 | Freq: Once | INTRAVENOUS | Status: AC
  Administered 2012-03-16: 13 mg via INTRAVENOUS
  Filled 2012-03-16: qty 13

## 2012-03-16 MED ORDER — SODIUM CHLORIDE 0.9 % IV SOLN
10.0000 [IU]/m2 | Freq: Once | INTRAVENOUS | Status: AC
  Administered 2012-03-16: 22 [IU] via INTRAVENOUS
  Filled 2012-03-16: qty 7.3

## 2012-03-16 MED ORDER — SODIUM CHLORIDE 0.9 % IV SOLN
375.0000 mg/m2 | Freq: Once | INTRAVENOUS | Status: AC
  Administered 2012-03-16: 800 mg via INTRAVENOUS
  Filled 2012-03-16: qty 40

## 2012-03-16 MED ORDER — DOXORUBICIN HCL CHEMO IV INJECTION 2 MG/ML
25.0000 mg/m2 | Freq: Once | INTRAVENOUS | Status: AC
  Administered 2012-03-16: 54 mg via INTRAVENOUS
  Filled 2012-03-16: qty 27

## 2012-03-16 MED ORDER — SODIUM CHLORIDE 0.9 % IV SOLN: Freq: Once | INTRAVENOUS | Status: DC

## 2012-03-16 MED ORDER — HEPARIN SOD (PORK) LOCK FLUSH 100 UNIT/ML IV SOLN
500.0000 [IU] | Freq: Once | INTRAVENOUS | Status: AC | PRN
  Administered 2012-03-16: 500 [IU]
  Filled 2012-03-16: qty 5

## 2012-03-16 MED ORDER — ONDANSETRON 16 MG/50ML IVPB (CHCC)
16.0000 mg | Freq: Once | INTRAVENOUS | Status: AC
  Administered 2012-03-16: 16 mg via INTRAVENOUS

## 2012-03-16 NOTE — Progress Notes (Signed)
Ok to treat with WBC's 2.6 and Anc 0.6 per Lonna Cobb.

## 2012-03-17 ENCOUNTER — Ambulatory Visit (HOSPITAL_BASED_OUTPATIENT_CLINIC_OR_DEPARTMENT_OTHER): Payer: Medicaid Other

## 2012-03-17 ENCOUNTER — Ambulatory Visit: Payer: Medicaid Other

## 2012-03-17 DIAGNOSIS — C8190 Hodgkin lymphoma, unspecified, unspecified site: Secondary | ICD-10-CM

## 2012-03-17 MED ORDER — PEGFILGRASTIM INJECTION 6 MG/0.6ML
6.0000 mg | Freq: Once | SUBCUTANEOUS | Status: AC
  Administered 2012-03-17: 6 mg via SUBCUTANEOUS

## 2012-03-19 ENCOUNTER — Other Ambulatory Visit: Payer: Self-pay | Admitting: Certified Registered Nurse Anesthetist

## 2012-03-22 ENCOUNTER — Ambulatory Visit
Admission: RE | Admit: 2012-03-22 | Discharge: 2012-03-22 | Disposition: A | Discharge status: Home / Self Care | Payer: Medicaid Other | Source: Ambulatory Visit | Attending: Radiation Oncology | Admitting: Radiation Oncology

## 2012-03-22 ENCOUNTER — Encounter: Payer: Self-pay | Admitting: Radiation Oncology

## 2012-03-22 DIAGNOSIS — Z79899 Other long term (current) drug therapy: Secondary | ICD-10-CM | POA: Insufficient documentation

## 2012-03-22 DIAGNOSIS — Z510 Encounter for antineoplastic radiation therapy: Secondary | ICD-10-CM | POA: Insufficient documentation

## 2012-03-22 DIAGNOSIS — E119 Type 2 diabetes mellitus without complications: Secondary | ICD-10-CM | POA: Insufficient documentation

## 2012-03-22 DIAGNOSIS — C8190 Hodgkin lymphoma, unspecified, unspecified site: Secondary | ICD-10-CM | POA: Insufficient documentation

## 2012-03-22 DIAGNOSIS — I10 Essential (primary) hypertension: Secondary | ICD-10-CM | POA: Insufficient documentation

## 2012-03-22 DIAGNOSIS — J45909 Unspecified asthma, uncomplicated: Secondary | ICD-10-CM | POA: Insufficient documentation

## 2012-03-22 NOTE — Progress Notes (Signed)
Radiation Oncology         [phone] ________________________________  Initial outpatient Consultation  Name: [name] MRN: [id number]  Date: 03/22/2012  DOB: 1980-12-22  WU:[id number],NWGNF A, MD, MD  Levert Feinstein, MD   REFERRING PHYSICIAN: Levert Feinstein, MD  DIAGNOSIS: Stage IIB Hodgkin's Disease  HISTORY OF PRESENT ILLNESS::[name] is a 31 y.o. female who is  referred from Dr. Cheree Ditto for tenderness today for my opinion regarding radiation in the management of her newly diagnosed Hodgkin's disease. [name] is feeling well except for some hot flashes at night. She is pleased that her response to 4 cycles of ABVD chemotherapy. He is having some indigestion but other than that is doing well. Accompanied by her mother and grandmother today. She states her history began with a few months of swelling underneath her left arm and left neck. This was treated as boils. She ultimately was referred to Dr. Andrey Campanile who performed an excisional biopsy on 01/12/2012. This showed classical Hodgkin's lymphoma positive for CD30 and CD15. It was felt to be a mixed cellularity type. Her ESR was elevated at presentation and she did have weight loss fevers and chills. A PET scan was performed on 01/24/2012 which showed hypermetabolic cervical supraclavicular and left axillary adenopathy. Mild activity was noted within the anterior mediastinum and paratracheal nodes as well as small right axillary nodes concerning for nodal metastases. No evidence of disease was noted within the spleen or below the diaphragm. He has not had a followup PET scan yet.  PREVIOUS RADIATION THERAPY: No  PAST MEDICAL HISTORY:  has a past medical history of Hypertension; Diabetes mellitus; Asthma; Complication of anesthesia (SLOW TO WAKE UP AFTER C SECTION); Eczema; Hodgkin lymphoma (01/18/2012); Benign essential HTN (01/19/2012); DM II (diabetes mellitus, type II), controlled (01/19/2012); Eczema (01/19/2012);  hodgkins lymphoma (dx'd 01/16/12); and Muscle spasm of back (03/09/2012).    PAST SURGICAL HISTORY: Past Surgical History  Procedure Date  . Dilation and curettage of uterus   . Cesarean section   . Axillary lymph node biopsy 12/2011    left  . Portacath placement 01/25/2012    Procedure: INSERTION PORT-A-CATH;  Surgeon: Atilano Ina, MD,FACS;  Location: [facility];  Service: General;  Laterality: Right;  right subclavian    FAMILY HISTORY: family history includes Diabetes in her maternal grandmother and Heart disease in her maternal grandfather.  SOCIAL HISTORY:  reports that she quit smoking about 5 years ago. She does not have any smokeless tobacco history on file. She reports that she does not drink alcohol or use illicit drugs.  ALLERGIES: Penicillins  MEDICATIONS:  Current Outpatient Prescriptions  Medication Sig Dispense Refill  . cyclobenzaprine (FLEXERIL) 10 MG tablet Take 10 mg by mouth 3 (three) times daily as needed.      . ergocalciferol (VITAMIN D2) 50000 UNITS capsule Take 50,000 Units by mouth every 7 (seven) days. Sunday      . hydrocortisone cream 1 % Apply 1 application topically 2 (two) times daily as needed. For eczema      . hydrOXYzine (ATARAX/VISTARIL) 25 MG tablet Take 25 mg by mouth 3 (three) times daily as needed.      . lidocaine-prilocaine (EMLA) cream Apply topically as needed. Apply to [facility] 1 hour prior to procedure  30 g  prn  . lisinopril-hydrochlorothiazide (PRINZIDE,ZESTORETIC) 20-12.5 MG per tablet Take 1 tablet by mouth daily.      Marland Kitchen LORazepam (ATIVAN) 2 MG tablet Take 1 tablet (2 mg total) by mouth  every 6 (six) hours as needed for anxiety (nausea or sleep). For anxiety  30 tablet  3  . metFORMIN (GLUCOPHAGE) 500 MG tablet Take 500 mg by mouth 2 (two) times daily with a meal.        . mometasone (ELOCON) 0.1 % ointment Apply 0.1 % topically 2 (two) times daily.      . naproxen (NAPROSYN) 500 MG tablet Take 500 mg by mouth daily as needed. For pain.      Marland Kitchen  ondansetron (ZOFRAN-ODT) 8 MG disintegrating tablet For nausea Zofran ODT 8mg  SL q 6h prn nausea/vomiting  25 tablet  prn  . zolpidem (AMBIEN) 5 MG tablet 1-2 tabs po hs prn sleep.  Script given to pt 01/30/12  30 tablet  4    REVIEW OF SYSTEMS:  A 15 point review of systems is documented in the electronic medical record. This was obtained by the nursing staff. However, I reviewed this with the patient to discuss relevant findings and make appropriate changes.  Pertinent items are noted in HPI.   PHYSICAL EXAM:  height is 5' 7.5" (1.715 m) and weight is 212 lb 14.4 oz (96.571 kg). Her oral temperature is 99.3 F (37.4 C). Her blood pressure is 159/100 and her pulse is 90. Her respiration is 18.   She is a pleasant female in no distress sitting comfortably examined table. She appears her stated age. She has no palpable cervical or supra-clavicular adenopathy. Her does regular rate and rhythm lungs are clear to auscultation bilaterally. She has no palpable axillary adenopathy. He is alert and oriented x3.  LABORATORY DATA:  Lab Results  Component Value Date   WBC 2.6* 03/16/2012   HGB 11.7 03/16/2012   HCT 33.3* 03/16/2012   MCV 86.0 03/16/2012   PLT 191 03/16/2012   Lab Results  Component Value Date   NA 139 02/17/2012   K 4.0 02/17/2012   CL 106 02/17/2012   CO2 25 02/17/2012   Lab Results  Component Value Date   ALT 17 02/17/2012   AST 14 02/17/2012   ALKPHOS 52 02/17/2012   BILITOT 0.4 02/17/2012     RADIOGRAPHY: No results found.    IMPRESSION: Stage IIB classic Hodgkin's disease  PLAN: I discussed with Errin in her family today her diagnosis and options for treatment. We discussed the role of radiation in Hodgkin's disease. We discussed the acute and long-term effects of treatment including but not limited to a slight risk of skin darkening and fatigue. The long-term risks that have been reported in studies that used older radiation techniques include increased risk of heart disease and  breast cancer. We discussed that the use of combined modality treatment minimize the risks of chemotherapy by giving lower doses and minimize the risks of radiation I treating smaller volumes and to lower doses. He discussed that radiation will begin after her chemotherapy was completed and would likely involve 3-4 weeks of treatment. Despite the involvement of her bilateral cervical and supraclavicular nodes I believe that we will be able to use IM RT to spare critical normal structures and ensure adequate treatment of these areas. We discussed that she would require yearly MRIs beginning at the age of 53 for monitoring for breast cancer. We discussed that the breast cancers that are found in Hodgkin's disease patients usually have a good prognosis. We discussed that her risk factors for heart disease include hypertension and diabetes.  Controlling these risk factors as well as minimizing the dose to her heart  with radiation is important. We discussed her heartburn symptoms and I think it's important for her to start a proton pump inhibitor. I have mentioned several over-the-counter products for her to try. This is likely due to the prednisone and her chemotherapy. Regarding her hot flashes we discussed that she could be placed into iatrogenic menopause. She is going to discuss this with Dr. Cyndie Chime when she meets with him as well. I spent 60 minutes minutes face to face with the patient and more than 50% of that time was spent in counseling and/or coordination of care.   ------------------------------------------------  Lurline Hare, MD

## 2012-03-22 NOTE — Progress Notes (Signed)
HERE TODAY FOR CONSULT OF HODGKINS LYMPHOMA.  LOCATED IN LEFT AXILLA AREA AND LEFT NECK.  GETTING READY TO TAKE 5TH CYCLE O ABVD, WILL BE GETTING 8.  C/O BEING ACHEY TODAY , ALSO HAVING DISCOMFORT IN CENTER OF CHEST, THINKS IT MAY BE INDIGESTION BUT NOTHING IS RELIEVING IT.  SHE KNOWS THE BODY ACHES ARE COMING FROM THE [place].

## 2012-03-26 ENCOUNTER — Other Ambulatory Visit: Payer: Self-pay | Admitting: Oncology

## 2012-03-26 ENCOUNTER — Telehealth: Payer: Self-pay | Admitting: Oncology

## 2012-03-26 NOTE — Telephone Encounter (Signed)
Called and spoke with patient today. There is a discrepency about patients medicaid, patient has new medicaid card she brought in on 03/16/12 but when we call Social Service Department they are stating that they don't have record of patient having medicaid. Will check further into the situation by calling case worker if possible.

## 2012-03-29 ENCOUNTER — Ambulatory Visit: Payer: Medicaid Other

## 2012-03-30 ENCOUNTER — Encounter: Payer: Self-pay | Admitting: Oncology

## 2012-03-30 ENCOUNTER — Other Ambulatory Visit (HOSPITAL_BASED_OUTPATIENT_CLINIC_OR_DEPARTMENT_OTHER): Payer: Medicaid Other | Admitting: Lab

## 2012-03-30 ENCOUNTER — Ambulatory Visit (HOSPITAL_BASED_OUTPATIENT_CLINIC_OR_DEPARTMENT_OTHER): Payer: Medicaid Other

## 2012-03-30 DIAGNOSIS — E119 Type 2 diabetes mellitus without complications: Secondary | ICD-10-CM

## 2012-03-30 DIAGNOSIS — C8190 Hodgkin lymphoma, unspecified, unspecified site: Secondary | ICD-10-CM

## 2012-03-30 DIAGNOSIS — Z5111 Encounter for antineoplastic chemotherapy: Secondary | ICD-10-CM

## 2012-03-30 LAB — CBC WITH DIFFERENTIAL/PLATELET
BASO%: 1.1 % (ref 0.0–2.0)
Eosinophils Absolute: 0.4 10*3/uL (ref 0.0–0.5)
LYMPH%: 18.2 % (ref 14.0–49.7)
MCHC: 35.6 g/dL (ref 31.5–36.0)
MONO#: 0.7 10*3/uL (ref 0.1–0.9)
NEUT#: 5.6 10*3/uL (ref 1.5–6.5)
RBC: 4.12 10*6/uL (ref 3.70–5.45)
RDW: 14.9 % — ABNORMAL HIGH (ref 11.2–14.5)
WBC: 8.3 10*3/uL (ref 3.9–10.3)
lymph#: 1.5 10*3/uL (ref 0.9–3.3)
nRBC: 0 % (ref 0–0)

## 2012-03-30 MED ORDER — DEXAMETHASONE SODIUM PHOSPHATE 4 MG/ML IJ SOLN
20.0000 mg | Freq: Once | INTRAMUSCULAR | Status: AC
  Administered 2012-03-30: 20 mg via INTRAVENOUS

## 2012-03-30 MED ORDER — SODIUM CHLORIDE 0.9 % IJ SOLN
10.0000 mL | INTRAMUSCULAR | Status: DC | PRN
  Administered 2012-03-30: 10 mL
  Filled 2012-03-30: qty 10

## 2012-03-30 MED ORDER — SODIUM CHLORIDE 0.9 % IV SOLN
375.0000 mg/m2 | Freq: Once | INTRAVENOUS | Status: AC
  Administered 2012-03-30: 800 mg via INTRAVENOUS
  Filled 2012-03-30: qty 40

## 2012-03-30 MED ORDER — HEPARIN SOD (PORK) LOCK FLUSH 100 UNIT/ML IV SOLN
500.0000 [IU] | Freq: Once | INTRAVENOUS | Status: AC | PRN
  Administered 2012-03-30: 500 [IU]
  Filled 2012-03-30: qty 5

## 2012-03-30 MED ORDER — VINBLASTINE SULFATE CHEMO INJECTION 1 MG/ML
6.0000 mg/m2 | Freq: Once | INTRAVENOUS | Status: AC
  Administered 2012-03-30: 13 mg via INTRAVENOUS
  Filled 2012-03-30: qty 13

## 2012-03-30 MED ORDER — SODIUM CHLORIDE 0.9 % IV SOLN
10.0000 [IU]/m2 | Freq: Once | INTRAVENOUS | Status: AC
  Administered 2012-03-30: 22 [IU] via INTRAVENOUS
  Filled 2012-03-30: qty 7.3

## 2012-03-30 MED ORDER — SODIUM CHLORIDE 0.9 % IV SOLN
Freq: Once | INTRAVENOUS | Status: AC
  Administered 2012-03-30: 10:00:00 via INTRAVENOUS

## 2012-03-30 MED ORDER — DOXORUBICIN HCL CHEMO IV INJECTION 2 MG/ML
25.0000 mg/m2 | Freq: Once | INTRAVENOUS | Status: AC
  Administered 2012-03-30: 54 mg via INTRAVENOUS
  Filled 2012-03-30: qty 27

## 2012-03-30 MED ORDER — ONDANSETRON 16 MG/50ML IVPB (CHCC)
16.0000 mg | Freq: Once | INTRAVENOUS | Status: AC
  Administered 2012-03-30: 16 mg via INTRAVENOUS

## 2012-03-30 NOTE — Progress Notes (Signed)
Spoke with patient today, patient medicaid has been inactive, patient stated that she went down to social services and that they were in the process of activating her. I gave patient EPP application asked if she would fill out for me and bring back because as of right now she has no insurance. Patient stated that she would.

## 2012-03-30 NOTE — Patient Instructions (Signed)
[facility] Discharge Instructions for Patients Receiving Chemotherapy  Today you received the following chemotherapy agents; Adriamycin, Velban, Bleocin and Decarbazine.    To help prevent nausea and vomiting after your treatment, we encourage you to take your nausea medication.  Begin taking it as often as prescribed by your physician.    If you develop nausea and vomiting that is not controlled by your nausea medication, call the clinic. If it is after clinic hours your family physician or the after hours number for the clinic or go to the Emergency Department.   BELOW ARE SYMPTOMS THAT SHOULD BE REPORTED IMMEDIATELY:  *FEVER GREATER THAN 100.5 F  *CHILLS WITH OR WITHOUT FEVER  NAUSEA AND VOMITING THAT IS NOT CONTROLLED WITH YOUR NAUSEA MEDICATION  *UNUSUAL SHORTNESS OF BREATH  *UNUSUAL BRUISING OR BLEEDING  TENDERNESS IN MOUTH AND THROAT WITH OR WITHOUT PRESENCE OF ULCERS  *URINARY PROBLEMS  *BOWEL PROBLEMS  UNUSUAL RASH Items with * indicate a potential emergency and should be followed up as soon as possible.  One of the nurses will contact you 24 hours after your treatment. Please let the nurse know about any problems that you may have experienced. Feel free to call the clinic you have any questions or concerns. The clinic phone number is [phone].   I have been informed and understand all the instructions given to me. I know to contact the clinic, my physician, or go to the Emergency Department if any problems should occur. I do not have any questions at this time, but understand that I may call the clinic during office hours   should I have any questions or need assistance in obtaining follow up care.    __________________________________________  _____________  __________ Signature of Patient or Authorized Representative            Date                   Time    __________________________________________ Nurse's Signature

## 2012-03-30 NOTE — Progress Notes (Signed)
1035 Patients B/P 158/105, HR=74, Temp 97.9. Spoke with patient about her elevated B/P, patient states she is on medication and that this is within her norma range . Advised patient to follow-up with her primary physician.

## 2012-04-10 ENCOUNTER — Ambulatory Visit (HOSPITAL_BASED_OUTPATIENT_CLINIC_OR_DEPARTMENT_OTHER): Payer: Medicaid Other | Admitting: Oncology

## 2012-04-10 ENCOUNTER — Encounter: Payer: Self-pay | Admitting: *Deleted

## 2012-04-10 ENCOUNTER — Other Ambulatory Visit (HOSPITAL_BASED_OUTPATIENT_CLINIC_OR_DEPARTMENT_OTHER): Payer: Medicaid Other | Admitting: Lab

## 2012-04-10 ENCOUNTER — Telehealth: Payer: Self-pay | Admitting: Oncology

## 2012-04-10 VITALS — BP 149/104 | HR 83 | Temp 97.1°F | Ht 67.5 in | Wt 216.1 lb

## 2012-04-10 DIAGNOSIS — I10 Essential (primary) hypertension: Secondary | ICD-10-CM

## 2012-04-10 DIAGNOSIS — C8190 Hodgkin lymphoma, unspecified, unspecified site: Secondary | ICD-10-CM

## 2012-04-10 DIAGNOSIS — C8100 Nodular lymphocyte predominant Hodgkin lymphoma, unspecified site: Secondary | ICD-10-CM

## 2012-04-10 DIAGNOSIS — C8191 Hodgkin lymphoma, unspecified, lymph nodes of head, face, and neck: Secondary | ICD-10-CM

## 2012-04-10 DIAGNOSIS — E119 Type 2 diabetes mellitus without complications: Secondary | ICD-10-CM

## 2012-04-10 DIAGNOSIS — N959 Unspecified menopausal and perimenopausal disorder: Secondary | ICD-10-CM

## 2012-04-10 LAB — CBC WITH DIFFERENTIAL/PLATELET
BASO%: 2.7 % — ABNORMAL HIGH (ref 0.0–2.0)
EOS%: 12.9 % — ABNORMAL HIGH (ref 0.0–7.0)
MCH: 31.2 pg (ref 25.1–34.0)
MCHC: 34.4 g/dL (ref 31.5–36.0)
MCV: 90.5 fL (ref 79.5–101.0)
MONO%: 16.8 % — ABNORMAL HIGH (ref 0.0–14.0)
NEUT#: 1.2 10*3/uL — ABNORMAL LOW (ref 1.5–6.5)
RBC: 3.47 10*6/uL — ABNORMAL LOW (ref 3.70–5.45)
RDW: 15.5 % — ABNORMAL HIGH (ref 11.2–14.5)
nRBC: 0 % (ref 0–0)

## 2012-04-10 LAB — COMPREHENSIVE METABOLIC PANEL
ALT: 16 U/L (ref 0–35)
Alkaline Phosphatase: 63 U/L (ref 39–117)
Creatinine, Ser: 0.72 mg/dL (ref 0.50–1.10)
Sodium: 140 mEq/L (ref 135–145)
Total Bilirubin: 0.4 mg/dL (ref 0.3–1.2)
Total Protein: 6.7 g/dL (ref 6.0–8.3)

## 2012-04-10 NOTE — Telephone Encounter (Signed)
Gave pt appt for May, June 2013, lab, ML and chemo. For July pt is is having PET and CT then see MD

## 2012-04-10 NOTE — Progress Notes (Signed)
Hematology and Oncology Follow Up Visit  [name] [id number] 03/06/1981 31 y.o. 04/10/2012 11:22 AM   Principle Diagnosis: Encounter Diagnosis  Name Primary?  . Hodgkin's lymphoma with lymphocytic predominance, stage IIB Yes     Interim History:   A followup visit for this pleasant 31 year old woman with stage IIB Hodgkin's lymphoma currently under active treatment. She has had 2-1/2 cycles of ABVD. Overall tolerating treatment well. I have not done any dose reductions despite neutropenia. Neulasta support was added early on. I did give her an injection of Zoladex prior to starting chemotherapy to preserve her ovarian function. Since that time, there was a study done and reported in the Journal of clinical oncology which prospectively looked at Zoladex versus placebo and there did not seem to be any difference in ovarian recovery in the patients who received the Zoladex compared to the placebo arm.   She did get in to see radiation oncology, Dr. Michell Heinrich, and she is felt to be a candidate for combined modality treatment. This being the case, I will abbreviate her ABVD to a total of 4 cycles. She completes cycle 3 this Friday and will complete cycle 4 on June 14. I have ordered restaging scans to be done on July 1 including a PET scan.  She has had an excellent clinical response with rapid and complete resolution of bulky left axillary lymphadenopathy which was her presenting sign.  She is getting cumulative nausea and intermittent vomiting from the chemotherapy program. Hot flashes have become a major issue likely related to combined effects of the Zoladex in suppressing ovarian function and the chemotherapy.  She denies any respiratory symptoms no cough, no dyspnea.  She is starting to get skin changes from the bleomycin with increased pigmentation on the palms of her hands and soles of her feet and other patchy areas on her extremities.  Medications: reviewed  Allergies:    Allergies  Allergen Reactions  . Penicillins Other (See Comments)    Childhood reaction    Review of Systems: Constitutional:   Cumulative fatigue on the chemotherapy program Respiratory: See above Cardiovascular:  She denies chest pain palpitations or chest pressure Gastrointestinal: No diarrhea Genito-Urinary: No urinary tract symptoms Musculoskeletal: No muscle or bone pain Neurologic: No headache, change in vision, or paresthesias Skin: See above Remaining ROS negative.  Physical Exam: Blood pressure 149/104, pulse 83, temperature 97.1 F (36.2 C), temperature source Oral, height 5' 7.5" (1.715 m), weight 216 lb 1.6 oz (98.022 kg). Wt Readings from Last 3 Encounters:  04/10/12 216 lb 1.6 oz (98.022 kg)  03/22/12 212 lb 14.4 oz (96.571 kg)  03/09/12 218 lb 6.4 oz (99.066 kg)     General appearance: Well-nourished African American woman HENNT: Pharynx no erythema or exudate neck full range of motion Lymph nodes: Complete resolution of previous bulky left axillary lymphadenopathy. No other areas of adenopathy in the neck, right axilla, or supraclavicular regions. Breasts: Lungs: Clear to auscultation resonant to percussion Heart: Regular rhythm no murmur or gallop Abdomen: Soft nontender no mass no organomegaly Extremities: No edema no calf tenderness Vascular: No cyanosis Neurologic: Motor strength 5 over 5 reflexes 1+ symmetric. Sensation intact to vibration over the fingertips by tuning fork exam Skin: Hyperpigmentation palms of her hands, some patchy hyperpigmented and nodular areas on the skin of her wrists and arms.  Lab Results: Lab Results  Component Value Date   WBC 3.1* 04/10/2012   HGB 10.8* 04/10/2012   HCT 31.4* 04/10/2012   MCV 90.5 04/10/2012  PLT 285 04/10/2012     Chemistry      Component Value Date/Time   NA 139 02/17/2012 0923   K 4.0 02/17/2012 0923   CL 106 02/17/2012 0923   CO2 25 02/17/2012 0923   BUN 12 02/17/2012 0923   CREATININE 0.82  02/17/2012 0923   CREATININE 0.67 10/08/2007 1250      Component Value Date/Time   CALCIUM 8.8 02/17/2012 0923   ALKPHOS 52 02/17/2012 0923   AST 14 02/17/2012 0923   ALT 17 02/17/2012 0923   BILITOT 0.4 02/17/2012 0923       Impression and Plan: #1. Stage IIb Hodgkin's lymphoma. She is clearly responding to treatment with resolution of bulky adenopathy on exam. Plan is to complete a total of 4 cycles of ABVD by 6/14 and restage her. She will then be referred for involved field radiation.  #2. Iatrogenic postmenopausal symptoms These should resolve slowly over time.  #3. Type 2 diabetes.  #4. Essential hypertension   CC:. Dr. Milford Cage; and Dr. Lurline Hare radiation oncology   Levert Feinstein, MD 5/14/201311:22 AM

## 2012-04-12 ENCOUNTER — Ambulatory Visit: Payer: Medicaid Other

## 2012-04-13 ENCOUNTER — Encounter: Payer: Self-pay | Admitting: Dietician

## 2012-04-13 ENCOUNTER — Ambulatory Visit (HOSPITAL_BASED_OUTPATIENT_CLINIC_OR_DEPARTMENT_OTHER): Payer: Medicaid Other

## 2012-04-13 ENCOUNTER — Other Ambulatory Visit (HOSPITAL_BASED_OUTPATIENT_CLINIC_OR_DEPARTMENT_OTHER): Payer: Medicaid Other | Admitting: Lab

## 2012-04-13 DIAGNOSIS — C8190 Hodgkin lymphoma, unspecified, unspecified site: Secondary | ICD-10-CM

## 2012-04-13 DIAGNOSIS — C8100 Nodular lymphocyte predominant Hodgkin lymphoma, unspecified site: Secondary | ICD-10-CM

## 2012-04-13 DIAGNOSIS — Z5111 Encounter for antineoplastic chemotherapy: Secondary | ICD-10-CM

## 2012-04-13 LAB — CBC WITH DIFFERENTIAL/PLATELET
BASO%: 2.8 % — ABNORMAL HIGH (ref 0.0–2.0)
EOS%: 11 % — ABNORMAL HIGH (ref 0.0–7.0)
LYMPH%: 33.6 % (ref 14.0–49.7)
MCH: 30.6 pg (ref 25.1–34.0)
MCHC: 35.1 g/dL (ref 31.5–36.0)
MCV: 87.2 fL (ref 79.5–101.0)
MONO%: 14.7 % — ABNORMAL HIGH (ref 0.0–14.0)
NEUT#: 1.3 10*3/uL — ABNORMAL LOW (ref 1.5–6.5)
RBC: 3.66 10*6/uL — ABNORMAL LOW (ref 3.70–5.45)
RDW: 14.6 % — ABNORMAL HIGH (ref 11.2–14.5)

## 2012-04-13 LAB — COMPREHENSIVE METABOLIC PANEL
AST: 13 U/L (ref 0–37)
Albumin: 3.9 g/dL (ref 3.5–5.2)
Alkaline Phosphatase: 60 U/L (ref 39–117)
Potassium: 3.8 mEq/L (ref 3.5–5.3)
Sodium: 140 mEq/L (ref 135–145)
Total Bilirubin: 0.4 mg/dL (ref 0.3–1.2)
Total Protein: 6.7 g/dL (ref 6.0–8.3)

## 2012-04-13 MED ORDER — VINBLASTINE SULFATE CHEMO INJECTION 1 MG/ML
6.0000 mg/m2 | Freq: Once | INTRAVENOUS | Status: AC
  Administered 2012-04-13: 13 mg via INTRAVENOUS
  Filled 2012-04-13: qty 13

## 2012-04-13 MED ORDER — HEPARIN SOD (PORK) LOCK FLUSH 100 UNIT/ML IV SOLN
500.0000 [IU] | Freq: Once | INTRAVENOUS | Status: AC | PRN
  Administered 2012-04-13: 500 [IU]
  Filled 2012-04-13: qty 5

## 2012-04-13 MED ORDER — ONDANSETRON 16 MG/50ML IVPB (CHCC)
16.0000 mg | Freq: Once | INTRAVENOUS | Status: AC
  Administered 2012-04-13: 16 mg via INTRAVENOUS

## 2012-04-13 MED ORDER — SODIUM CHLORIDE 0.9 % IJ SOLN
10.0000 mL | INTRAMUSCULAR | Status: DC | PRN
  Administered 2012-04-13: 10 mL
  Filled 2012-04-13: qty 10

## 2012-04-13 MED ORDER — SODIUM CHLORIDE 0.9 % IV SOLN
Freq: Once | INTRAVENOUS | Status: AC
  Administered 2012-04-13: 10:00:00 via INTRAVENOUS

## 2012-04-13 MED ORDER — DOXORUBICIN HCL CHEMO IV INJECTION 2 MG/ML
25.0000 mg/m2 | Freq: Once | INTRAVENOUS | Status: AC
  Administered 2012-04-13: 54 mg via INTRAVENOUS
  Filled 2012-04-13: qty 27

## 2012-04-13 MED ORDER — SODIUM CHLORIDE 0.9 % IV SOLN
375.0000 mg/m2 | Freq: Once | INTRAVENOUS | Status: AC
  Administered 2012-04-13: 800 mg via INTRAVENOUS
  Filled 2012-04-13: qty 40

## 2012-04-13 MED ORDER — SODIUM CHLORIDE 0.9 % IV SOLN
10.0000 [IU]/m2 | Freq: Once | INTRAVENOUS | Status: AC
  Administered 2012-04-13: 22 [IU] via INTRAVENOUS
  Filled 2012-04-13: qty 7.3

## 2012-04-13 MED ORDER — DEXAMETHASONE SODIUM PHOSPHATE 4 MG/ML IJ SOLN
20.0000 mg | Freq: Once | INTRAMUSCULAR | Status: AC
  Administered 2012-04-13: 20 mg via INTRAVENOUS

## 2012-04-14 ENCOUNTER — Ambulatory Visit (HOSPITAL_BASED_OUTPATIENT_CLINIC_OR_DEPARTMENT_OTHER): Payer: Medicaid Other

## 2012-04-14 DIAGNOSIS — C8190 Hodgkin lymphoma, unspecified, unspecified site: Secondary | ICD-10-CM

## 2012-04-14 MED ORDER — PEGFILGRASTIM INJECTION 6 MG/0.6ML
6.0000 mg | Freq: Once | SUBCUTANEOUS | Status: AC
  Administered 2012-04-14: 6 mg via SUBCUTANEOUS

## 2012-04-17 ENCOUNTER — Emergency Department (HOSPITAL_COMMUNITY)
Admission: EM | Admit: 2012-04-17 | Discharge: 2012-04-17 | Disposition: A | Discharge status: Home / Self Care | Payer: Medicaid Other | Attending: Emergency Medicine | Admitting: Emergency Medicine

## 2012-04-17 ENCOUNTER — Encounter (HOSPITAL_COMMUNITY): Payer: Self-pay | Admitting: Emergency Medicine

## 2012-04-17 DIAGNOSIS — H9209 Otalgia, unspecified ear: Secondary | ICD-10-CM | POA: Insufficient documentation

## 2012-04-17 DIAGNOSIS — Z79899 Other long term (current) drug therapy: Secondary | ICD-10-CM | POA: Insufficient documentation

## 2012-04-17 DIAGNOSIS — E119 Type 2 diabetes mellitus without complications: Secondary | ICD-10-CM | POA: Insufficient documentation

## 2012-04-17 DIAGNOSIS — I10 Essential (primary) hypertension: Secondary | ICD-10-CM | POA: Insufficient documentation

## 2012-04-17 DIAGNOSIS — C8194 Hodgkin lymphoma, unspecified, lymph nodes of axilla and upper limb: Secondary | ICD-10-CM | POA: Insufficient documentation

## 2012-04-17 DIAGNOSIS — J45909 Unspecified asthma, uncomplicated: Secondary | ICD-10-CM | POA: Insufficient documentation

## 2012-04-17 MED ORDER — IBUPROFEN 200 MG PO TABS
600.0000 mg | ORAL_TABLET | Freq: Once | ORAL | Status: AC
  Administered 2012-04-17: 600 mg via ORAL
  Filled 2012-04-17: qty 3

## 2012-04-17 MED ORDER — AZITHROMYCIN 250 MG PO TABS: 250.0000 mg | ORAL_TABLET | Freq: Every day | ORAL | Status: AC

## 2012-04-17 MED ORDER — ANTIPYRINE-BENZOCAINE 5.4-1.4 % OT SOLN
2.0000 [drp] | Freq: Once | OTIC | Status: AC
  Administered 2012-04-17: 2 [drp] via OTIC
  Filled 2012-04-17: qty 10

## 2012-04-17 NOTE — Discharge Instructions (Signed)

## 2012-04-17 NOTE — ED Provider Notes (Signed)
History     CSN: [id number]  Arrival date & time 04/17/12  1014   First MD Initiated Contact with Patient 04/17/12 1039      Chief Complaint  Patient presents with  . Otalgia     The history is provided by the patient.   the patient reports worsening bilateral ear pain for the last 2 days.  Left greater than right.  She does report some sinus pressure and congestion.  She denies cough or shortness of breath.  She's had no fevers or chills.  She has a history of diabetes as well as Hodgkin's lymphoma and is currently undergoing chemotherapy.  She's not tried anything for the pain.  She otherwise reports that she feels good.  Her pain is mild at this time  Past Medical History  Diagnosis Date  . Hypertension   . Diabetes mellitus   . Asthma     seasonal  . Complication of anesthesia SLOW TO WAKE UP AFTER C SECTION  . Eczema   . Hodgkin lymphoma 01/18/2012  . Benign essential HTN 01/19/2012  . DM II (diabetes mellitus, type II), controlled 01/19/2012  . Eczema 01/19/2012  . hodgkins lymphoma dx'd 01/16/12    lt axilla origin  . Muscle spasm of back 03/09/2012    Past Surgical History  Procedure Date  . Dilation and curettage of uterus   . Cesarean section   . Axillary lymph node biopsy 12/2011    left  . Portacath placement 01/25/2012    Procedure: INSERTION PORT-A-CATH;  Surgeon: Atilano Ina, MD,FACS;  Location: [facility];  Service: General;  Laterality: Right;  right subclavian    Family History  Problem Relation Age of Onset  . Diabetes Maternal Grandmother   . Heart disease Maternal Grandfather     History  Substance Use Topics  . Smoking status: Former Smoker    Quit date: 11/19/2006  . Smokeless tobacco: Not on file  . Alcohol Use: No    OB History    Grav Para Term Preterm Abortions TAB SAB Ect Mult Living   3 1 1  2  0 2 0 0 1      Review of Systems  HENT: Positive for ear pain.   All other systems reviewed and are negative.    Allergies   Penicillins  Home Medications   Current Outpatient Rx  Name Route Sig Dispense Refill  . CYCLOBENZAPRINE HCL 10 MG PO TABS Oral Take 10 mg by mouth 3 (three) times daily as needed.     . ERGOCALCIFEROL 50000 UNITS PO CAPS Oral Take 50,000 Units by mouth every 7 (seven) days. Sunday    . HYDROCORTISONE 1 % EX CREA Topical Apply 1 application topically 2 (two) times daily as needed. For eczema    . HYDROXYZINE HCL 25 MG PO TABS Oral Take 25 mg by mouth 3 (three) times daily as needed.    Marland Kitchen LIDOCAINE-PRILOCAINE 2.5-2.5 % EX CREA Topical Apply topically as needed. Apply to [facility] 1 hour prior to procedure 30 g prn    Pt unable to get script from [facility], [address] befo ...  . LISINOPRIL-HYDROCHLOROTHIAZIDE 20-12.5 MG PO TABS Oral Take 1 tablet by mouth daily.    Marland Kitchen LORAZEPAM 2 MG PO TABS Oral Take 1 tablet (2 mg total) by mouth every 6 (six) hours as needed for anxiety (nausea or sleep). For anxiety 30 tablet 3    This script was given to pt on 01/18/12.  . METFORMIN HCL 500 MG  PO TABS Oral Take 500 mg by mouth 2 (two) times daily with a meal.      . MOMETASONE FUROATE 0.1 % EX OINT Topical Apply 0.1 % topically 2 (two) times daily.    Marland Kitchen NAPROXEN 500 MG PO TABS Oral Take 500 mg by mouth daily as needed. For pain.    Marland Kitchen ONDANSETRON 8 MG PO TBDP  For nausea Zofran ODT 8mg  SL q 6h prn nausea/vomiting 25 tablet prn    This script was given to pt 01/18/12.  Marland Kitchen ZOLPIDEM TARTRATE 5 MG PO TABS  1-2 tabs po hs prn sleep.  Script given to pt 01/30/12 30 tablet 4  . AZITHROMYCIN 250 MG PO TABS Oral Take 1 tablet (250 mg total) by mouth daily. Take 2 tabs for first dose, then 1 tab for each additional dose 6 tablet 0    BP 151/94  Pulse 88  Temp(Src) 99.2 F (37.3 C) (Oral)  Resp 18  SpO2 100%  Physical Exam  Nursing note and vitals reviewed. Constitutional: She is oriented to person, place, and time. She appears well-developed and well-nourished. No distress.  HENT:  Head: Normocephalic and atraumatic.        External ears are normal bilaterally.  She is injected TMs bilaterally.  No swelling of her external auditory canals.  No mastoid tenderness bilaterally.  Eyes: EOM are normal.  Neck: Normal range of motion.  Cardiovascular: Normal rate, regular rhythm and normal heart sounds.   Pulmonary/Chest: Effort normal and breath sounds normal.  Abdominal: Soft. She exhibits no distension. There is no tenderness.  Musculoskeletal: Normal range of motion.  Neurological: She is alert and oriented to person, place, and time.  Skin: Skin is warm and dry.  Psychiatric: She has a normal mood and affect. Judgment normal.    ED Course  Procedures (including critical care time)  Labs Reviewed - No data to display No results found.   1. Otalgia       MDM  The patient may be developing early otitis media given her chemotherapy that she is currently on.  The patient will be written a prescription for azithromycin.  Ibuprofen and antipyrrine-benzocaine for pain.  Close PCP followup        Lyanne Co, MD 04/17/12 1052

## 2012-04-17 NOTE — ED Notes (Signed)
Per pt sharp pain on and off in both ears for 2 days-could be related to sinuses

## 2012-04-18 ENCOUNTER — Encounter: Payer: Self-pay | Admitting: Oncology

## 2012-04-18 NOTE — Progress Notes (Signed)
Patient approved for EPP 100% discount for family of 2, income is 3695.76 yearly as of right now she is working only 2 part time jobs and not getting a lot of hours.

## 2012-04-26 ENCOUNTER — Ambulatory Visit: Payer: Medicaid Other

## 2012-04-27 ENCOUNTER — Ambulatory Visit (HOSPITAL_BASED_OUTPATIENT_CLINIC_OR_DEPARTMENT_OTHER): Payer: Self-pay

## 2012-04-27 ENCOUNTER — Other Ambulatory Visit (HOSPITAL_BASED_OUTPATIENT_CLINIC_OR_DEPARTMENT_OTHER): Payer: Self-pay | Admitting: Lab

## 2012-04-27 ENCOUNTER — Ambulatory Visit (HOSPITAL_BASED_OUTPATIENT_CLINIC_OR_DEPARTMENT_OTHER): Payer: Self-pay | Admitting: Nurse Practitioner

## 2012-04-27 ENCOUNTER — Other Ambulatory Visit: Payer: Self-pay | Admitting: Nurse Practitioner

## 2012-04-27 VITALS — BP 159/114 | HR 86 | Temp 99.4°F | Ht 67.5 in | Wt 216.5 lb

## 2012-04-27 DIAGNOSIS — C8190 Hodgkin lymphoma, unspecified, unspecified site: Secondary | ICD-10-CM

## 2012-04-27 DIAGNOSIS — C8198 Hodgkin lymphoma, unspecified, lymph nodes of multiple sites: Secondary | ICD-10-CM

## 2012-04-27 DIAGNOSIS — Z5111 Encounter for antineoplastic chemotherapy: Secondary | ICD-10-CM

## 2012-04-27 DIAGNOSIS — E119 Type 2 diabetes mellitus without complications: Secondary | ICD-10-CM

## 2012-04-27 DIAGNOSIS — I10 Essential (primary) hypertension: Secondary | ICD-10-CM

## 2012-04-27 DIAGNOSIS — D702 Other drug-induced agranulocytosis: Secondary | ICD-10-CM

## 2012-04-27 LAB — CBC WITH DIFFERENTIAL/PLATELET
BASO%: 0.8 % (ref 0.0–2.0)
Basophils Absolute: 0.1 10e3/uL (ref 0.0–0.1)
EOS%: 4.5 % (ref 0.0–7.0)
Eosinophils Absolute: 0.4 10e3/uL (ref 0.0–0.5)
HCT: 31.1 % — ABNORMAL LOW (ref 34.8–46.6)
HGB: 10.9 g/dL — ABNORMAL LOW (ref 11.6–15.9)
LYMPH%: 18.1 % (ref 14.0–49.7)
MCH: 31.1 pg (ref 25.1–34.0)
MCHC: 35 g/dL (ref 31.5–36.0)
MCV: 88.6 fL (ref 79.5–101.0)
MONO#: 0.7 10e3/uL (ref 0.1–0.9)
MONO%: 8.2 % (ref 0.0–14.0)
NEUT#: 5.6 10e3/uL (ref 1.5–6.5)
NEUT%: 68.4 % (ref 38.4–76.8)
Platelets: 186 10e3/uL (ref 145–400)
RBC: 3.51 10e6/uL — ABNORMAL LOW (ref 3.70–5.45)
RDW: 14.9 % — ABNORMAL HIGH (ref 11.2–14.5)
WBC: 8.3 10e3/uL (ref 3.9–10.3)
lymph#: 1.5 10e3/uL (ref 0.9–3.3)
nRBC: 0 % (ref 0–0)

## 2012-04-27 MED ORDER — SODIUM CHLORIDE 0.9 % IV SOLN
10.0000 [IU]/m2 | Freq: Once | INTRAVENOUS | Status: AC
  Administered 2012-04-27: 22 [IU] via INTRAVENOUS
  Filled 2012-04-27: qty 7.3

## 2012-04-27 MED ORDER — DEXAMETHASONE SODIUM PHOSPHATE 4 MG/ML IJ SOLN
20.0000 mg | Freq: Once | INTRAMUSCULAR | Status: AC
  Administered 2012-04-27: 20 mg via INTRAVENOUS

## 2012-04-27 MED ORDER — HEPARIN SOD (PORK) LOCK FLUSH 100 UNIT/ML IV SOLN
500.0000 [IU] | Freq: Once | INTRAVENOUS | Status: DC | PRN
  Filled 2012-04-27: qty 5

## 2012-04-27 MED ORDER — SODIUM CHLORIDE 0.9 % IV SOLN
375.0000 mg/m2 | Freq: Once | INTRAVENOUS | Status: AC
  Administered 2012-04-27: 800 mg via INTRAVENOUS
  Filled 2012-04-27: qty 40

## 2012-04-27 MED ORDER — DOXORUBICIN HCL CHEMO IV INJECTION 2 MG/ML
25.0000 mg/m2 | Freq: Once | INTRAVENOUS | Status: AC
  Administered 2012-04-27: 54 mg via INTRAVENOUS
  Filled 2012-04-27: qty 27

## 2012-04-27 MED ORDER — ONDANSETRON 16 MG/50ML IVPB (CHCC)
16.0000 mg | Freq: Once | INTRAVENOUS | Status: AC
  Administered 2012-04-27: 16 mg via INTRAVENOUS

## 2012-04-27 MED ORDER — VINBLASTINE SULFATE CHEMO INJECTION 1 MG/ML
6.0000 mg/m2 | Freq: Once | INTRAVENOUS | Status: AC
  Administered 2012-04-27: 13 mg via INTRAVENOUS
  Filled 2012-04-27: qty 13

## 2012-04-27 MED ORDER — SODIUM CHLORIDE 0.9 % IV SOLN
Freq: Once | INTRAVENOUS | Status: AC
  Administered 2012-04-27: 12:00:00 via INTRAVENOUS

## 2012-04-27 MED ORDER — SODIUM CHLORIDE 0.9 % IJ SOLN
10.0000 mL | INTRAMUSCULAR | Status: DC | PRN
  Filled 2012-04-27: qty 10

## 2012-04-27 NOTE — Progress Notes (Signed)
OFFICE PROGRESS NOTE  Interval history:  [name] is a 31 year old woman with stage IIB Hodgkin's lymphoma currently under active treatment with ABVD. She has completed 3 cycles. She is seen today prior to proceeding with cycle 4. She had mild nausea without vomiting following the most recent chemotherapy. No mouth sores. She notes "gas and loose stools" for several days after each chemotherapy. No shortness of breath or cough. She continues to have hot flashes. She has a good appetite.   Objective: Blood pressure 159/114, pulse 86, temperature 99.4 F (37.4 C), temperature source Oral, height 5' 7.5" (1.715 m), weight 216 lb 8 oz (98.204 kg).  Oropharynx is without thrush or ulceration. No palpable cervical, supraclavicular or right axillary lymph nodes. 1 cm left axillary lymph node. Lungs clear. No wheezes or rales. Regular cardiac rhythm. Port-A-Cath site is without erythema. Abdomen is soft and nontender. No organomegaly. Extremities without edema. Motor strength 5 over 5.  Lab Results: Lab Results  Component Value Date   WBC 8.3 04/27/2012   HGB 10.9* 04/27/2012   HCT 31.1* 04/27/2012   MCV 88.6 04/27/2012   PLT 186 04/27/2012    Chemistry:    Chemistry      Component Value Date/Time   NA 140 04/13/2012 0931   K 3.8 04/13/2012 0931   CL 103 04/13/2012 0931   CO2 26 04/13/2012 0931   BUN 12 04/13/2012 0931   CREATININE 0.70 04/13/2012 0931   CREATININE 0.67 10/08/2007 1250      Component Value Date/Time   CALCIUM 8.9 04/13/2012 0931   ALKPHOS 60 04/13/2012 0931   AST 13 04/13/2012 0931   ALT 15 04/13/2012 0931   BILITOT 0.4 04/13/2012 0931       Studies/Results: No results found.  Medications: I have reviewed the patient's current medications.  Assessment/Plan:  1. Stage IIB Hodgkin's lymphoma presenting with a bulky left axillary lymphadenopathy with intermittent fevers, weight loss. Status post biopsy of left axillary adenopathy 01/12/2012 with pathology showing  classical Hodgkin's lymphoma. Staging PET scan on 01/24/2012 showed intensely hypermetabolic cervical and supraclavicular lymphadenopathy as well as bulky left axillary lymphadenopathy. There was mild activity within the anterior mediastinum and lower paratracheal nodes as well as small right axillary nodes. There was no evidence of abnormal activity within solid organs or activity below the hemidiaphragms. The spleen was normal. She began treatment with cycle 1 day 1 ABVD on 02/03/2012. She completed cycle 3 on 04/13/2012. 2. Status post Port-A-Cath placement 01/25/2012. 3. Neutropenia secondary to chemotherapy. She receives Neulasta with every other treatment. 4. Diabetes. She continues metformin. 5. Hypertension. 6. Hot flashes.  Disposition-[name] appears stable. Plan to proceed with cycle 4 ABVD today as scheduled. She will return for cycle 4 day 15 in 2 weeks to complete the chemotherapy portion of her treatment. Restaging CT/PET scheduled on 05/28/2012. She has a followup visit with Dr. Cyndie Chime on 06/01/2012. She has followup with Dr. Michell Heinrich on 06/06/2012. She will contact the office prior to her next visit with any problems.  Lonna Cobb ANP/GNP-BC

## 2012-04-27 NOTE — Progress Notes (Signed)
Blood return good before and after all vesicants.  dmr

## 2012-04-27 NOTE — Patient Instructions (Signed)
[facility] Discharge Instructions for Patients Receiving Chemotherapy  Today you received the following chemotherapy agents ABVD  To help prevent nausea and vomiting after your treatment, we encourage you to take your nausea medication  and take it as often as prescribed If you develop nausea and vomiting that is not controlled by your nausea medication, call the clinic. If it is after clinic hours your family physician or the after hours number for the clinic or go to the Emergency Department.   BELOW ARE SYMPTOMS THAT SHOULD BE REPORTED IMMEDIATELY:  *FEVER GREATER THAN 100.5 F  *CHILLS WITH OR WITHOUT FEVER  NAUSEA AND VOMITING THAT IS NOT CONTROLLED WITH YOUR NAUSEA MEDICATION  *UNUSUAL SHORTNESS OF BREATH  *UNUSUAL BRUISING OR BLEEDING  TENDERNESS IN MOUTH AND THROAT WITH OR WITHOUT PRESENCE OF ULCERS  *URINARY PROBLEMS  *BOWEL PROBLEMS  UNUSUAL RASH Items with * indicate a potential emergency and should be followed up as soon as possible.  One of the nurses will contact you 24 hours after your treatment. Please let the nurse know about any problems that you may have experienced. Feel free to call the clinic you have any questions or concerns. The clinic phone number is [phone].   I have been informed and understand all the instructions given to me. I know to contact the clinic, my physician, or go to the Emergency Department if any problems should occur. I do not have any questions at this time, but understand that I may call the clinic during office hours   should I have any questions or need assistance in obtaining follow up care.    __________________________________________  _____________  __________ Signature of Patient or Authorized Representative            Date                   Time    __________________________________________ Nurse's Signature

## 2012-05-03 ENCOUNTER — Other Ambulatory Visit: Payer: Self-pay | Admitting: Oncology

## 2012-05-07 NOTE — Progress Notes (Signed)
Encounter addended by: Tessa Lerner, RN on: 05/07/2012  4:00 PM<BR>     Documentation filed: Charges VN

## 2012-05-11 ENCOUNTER — Other Ambulatory Visit (HOSPITAL_BASED_OUTPATIENT_CLINIC_OR_DEPARTMENT_OTHER): Payer: Medicaid Other | Admitting: Lab

## 2012-05-11 ENCOUNTER — Ambulatory Visit (HOSPITAL_BASED_OUTPATIENT_CLINIC_OR_DEPARTMENT_OTHER): Payer: Medicaid Other

## 2012-05-11 ENCOUNTER — Other Ambulatory Visit: Payer: Self-pay | Admitting: *Deleted

## 2012-05-11 VITALS — BP 161/110 | HR 87

## 2012-05-11 DIAGNOSIS — C8198 Hodgkin lymphoma, unspecified, lymph nodes of multiple sites: Secondary | ICD-10-CM

## 2012-05-11 DIAGNOSIS — C8190 Hodgkin lymphoma, unspecified, unspecified site: Secondary | ICD-10-CM

## 2012-05-11 DIAGNOSIS — Z5111 Encounter for antineoplastic chemotherapy: Secondary | ICD-10-CM

## 2012-05-11 DIAGNOSIS — C8100 Nodular lymphocyte predominant Hodgkin lymphoma, unspecified site: Secondary | ICD-10-CM

## 2012-05-11 DIAGNOSIS — Z9221 Personal history of antineoplastic chemotherapy: Secondary | ICD-10-CM

## 2012-05-11 HISTORY — DX: Personal history of antineoplastic chemotherapy: Z92.21

## 2012-05-11 LAB — COMPREHENSIVE METABOLIC PANEL
ALT: 13 U/L (ref 0–35)
AST: 13 U/L (ref 0–37)
Albumin: 3.9 g/dL (ref 3.5–5.2)
Alkaline Phosphatase: 75 U/L (ref 39–117)
BUN: 8 mg/dL (ref 6–23)
Potassium: 4.1 mEq/L (ref 3.5–5.3)

## 2012-05-11 LAB — CBC WITH DIFFERENTIAL/PLATELET
Basophils Absolute: 0.1 10*3/uL (ref 0.0–0.1)
EOS%: 10.6 % — ABNORMAL HIGH (ref 0.0–7.0)
HCT: 32.5 % — ABNORMAL LOW (ref 34.8–46.6)
HGB: 11.4 g/dL — ABNORMAL LOW (ref 11.6–15.9)
MCH: 30.9 pg (ref 25.1–34.0)
MCV: 88.1 fL (ref 79.5–101.0)
MONO%: 23.6 % — ABNORMAL HIGH (ref 0.0–14.0)
NEUT%: 31.6 % — ABNORMAL LOW (ref 38.4–76.8)
RDW: 14.2 % (ref 11.2–14.5)

## 2012-05-11 LAB — LACTATE DEHYDROGENASE: LDH: 166 U/L (ref 94–250)

## 2012-05-11 LAB — SEDIMENTATION RATE: Sed Rate: 47 mm/hr — ABNORMAL HIGH (ref 0–22)

## 2012-05-11 MED ORDER — HEPARIN SOD (PORK) LOCK FLUSH 100 UNIT/ML IV SOLN
500.0000 [IU] | Freq: Once | INTRAVENOUS | Status: AC | PRN
  Administered 2012-05-11: 500 [IU]
  Filled 2012-05-11: qty 5

## 2012-05-11 MED ORDER — VINBLASTINE SULFATE CHEMO INJECTION 1 MG/ML
6.0000 mg/m2 | Freq: Once | INTRAVENOUS | Status: AC
  Administered 2012-05-11: 13 mg via INTRAVENOUS
  Filled 2012-05-11: qty 13

## 2012-05-11 MED ORDER — SODIUM CHLORIDE 0.9 % IV SOLN
375.0000 mg/m2 | Freq: Once | INTRAVENOUS | Status: AC
  Administered 2012-05-11: 800 mg via INTRAVENOUS
  Filled 2012-05-11: qty 40

## 2012-05-11 MED ORDER — SODIUM CHLORIDE 0.9 % IV SOLN
Freq: Once | INTRAVENOUS | Status: AC
  Administered 2012-05-11: 10:00:00 via INTRAVENOUS

## 2012-05-11 MED ORDER — ONDANSETRON 16 MG/50ML IVPB (CHCC)
16.0000 mg | Freq: Once | INTRAVENOUS | Status: AC
  Administered 2012-05-11: 16 mg via INTRAVENOUS

## 2012-05-11 MED ORDER — DEXAMETHASONE SODIUM PHOSPHATE 4 MG/ML IJ SOLN
20.0000 mg | Freq: Once | INTRAMUSCULAR | Status: AC
  Administered 2012-05-11: 20 mg via INTRAVENOUS

## 2012-05-11 MED ORDER — SODIUM CHLORIDE 0.9 % IJ SOLN
10.0000 mL | INTRAMUSCULAR | Status: DC | PRN
  Administered 2012-05-11: 10 mL
  Filled 2012-05-11: qty 10

## 2012-05-11 MED ORDER — DOXORUBICIN HCL CHEMO IV INJECTION 2 MG/ML
25.0000 mg/m2 | Freq: Once | INTRAVENOUS | Status: AC
  Administered 2012-05-11: 54 mg via INTRAVENOUS
  Filled 2012-05-11: qty 27

## 2012-05-11 MED ORDER — SODIUM CHLORIDE 0.9 % IV SOLN
10.0000 [IU]/m2 | Freq: Once | INTRAVENOUS | Status: AC
  Administered 2012-05-11: 22 [IU] via INTRAVENOUS
  Filled 2012-05-11: qty 7.3

## 2012-05-11 NOTE — Progress Notes (Signed)
Ok to treat with ANC 1.1 per Lonna Cobb, NP

## 2012-05-11 NOTE — Patient Instructions (Signed)
[facility] Discharge Instructions for Patients Receiving Chemotherapy  Today you received the following chemotherapy agents Adriamycin, Bleomycin, Vinblastine, Dacarbazine To help prevent nausea and vomiting after your treatment, we encourage you to take your nausea medication as prescribed.   If you develop nausea and vomiting that is not controlled by your nausea medication, call the clinic. If it is after clinic hours your family physician or the after hours number for the clinic or go to the Emergency Department.   BELOW ARE SYMPTOMS THAT SHOULD BE REPORTED IMMEDIATELY:  *FEVER GREATER THAN 100.5 F  *CHILLS WITH OR WITHOUT FEVER  NAUSEA AND VOMITING THAT IS NOT CONTROLLED WITH YOUR NAUSEA MEDICATION  *UNUSUAL SHORTNESS OF BREATH  *UNUSUAL BRUISING OR BLEEDING  TENDERNESS IN MOUTH AND THROAT WITH OR WITHOUT PRESENCE OF ULCERS  *URINARY PROBLEMS  *BOWEL PROBLEMS  UNUSUAL RASH Items with * indicate a potential emergency and should be followed up as soon as possible.  One of the nurses will contact you 24 hours after your treatment. Please let the nurse know about any problems that you may have experienced. Feel free to call the clinic you have any questions or concerns. The clinic phone number is [phone].   I have been informed and understand all the instructions given to me. I know to contact the clinic, my physician, or go to the Emergency Department if any problems should occur. I do not have any questions at this time, but understand that I may call the clinic during office hours   should I have any questions or need assistance in obtaining follow up care.    __________________________________________  _____________  __________ Signature of Patient or Authorized Representative            Date                   Time    __________________________________________ Nurse's Signature

## 2012-05-12 ENCOUNTER — Ambulatory Visit (HOSPITAL_BASED_OUTPATIENT_CLINIC_OR_DEPARTMENT_OTHER): Payer: Medicaid Other

## 2012-05-12 VITALS — BP 173/115 | HR 74 | Temp 99.0°F

## 2012-05-12 DIAGNOSIS — Z5189 Encounter for other specified aftercare: Secondary | ICD-10-CM

## 2012-05-12 DIAGNOSIS — C8190 Hodgkin lymphoma, unspecified, unspecified site: Secondary | ICD-10-CM

## 2012-05-12 DIAGNOSIS — C8198 Hodgkin lymphoma, unspecified, lymph nodes of multiple sites: Secondary | ICD-10-CM

## 2012-05-12 MED ORDER — PEGFILGRASTIM INJECTION 6 MG/0.6ML
6.0000 mg | Freq: Once | SUBCUTANEOUS | Status: AC
  Administered 2012-05-12: 6 mg via SUBCUTANEOUS

## 2012-05-23 ENCOUNTER — Encounter: Payer: Self-pay | Admitting: Oncology

## 2012-05-23 NOTE — Progress Notes (Signed)
05/23/2012 Good Morning Dr. Cyndie Chime, I received this morning via fax notification that this patient was only approved for the PET SCAN you ordered.  This approval came from [employer], the authorizing agency for [facility] Medicaid.  Their rationale on denying the chest, neck, abd and pelvis ct scans was that "Their Oncology Guidelines support either the PET SCAN or CT SCANS.  I have enclosed below the necessary information if you are interested in calling the Medical Director.  If this is not an option, I will contact radiology at the hospital to cancel the ct scans.  CASE# [id number]  [phone]  Option# 4  I await communication from you or your nurse.  Imagene Riches [phone]

## 2012-05-28 ENCOUNTER — Other Ambulatory Visit: Payer: Medicaid Other

## 2012-05-28 ENCOUNTER — Inpatient Hospital Stay (HOSPITAL_COMMUNITY)
Admission: RE | Admit: 2012-05-28 | Discharge: 2012-05-28 | Disposition: A | Discharge status: Home / Self Care | Payer: Medicaid Other | Source: Ambulatory Visit | Attending: Oncology | Admitting: Oncology

## 2012-05-28 ENCOUNTER — Ambulatory Visit (HOSPITAL_COMMUNITY)
Admission: RE | Admit: 2012-05-28 | Discharge: 2012-05-28 | Disposition: A | Discharge status: Home / Self Care | Payer: Medicaid Other | Source: Ambulatory Visit | Attending: Oncology | Admitting: Oncology

## 2012-05-28 ENCOUNTER — Inpatient Hospital Stay (HOSPITAL_COMMUNITY): Admission: RE | Admit: 2012-05-28 | Payer: Self-pay | Source: Ambulatory Visit

## 2012-05-28 DIAGNOSIS — C8192 Hodgkin lymphoma, unspecified, intrathoracic lymph nodes: Secondary | ICD-10-CM | POA: Insufficient documentation

## 2012-05-28 DIAGNOSIS — R918 Other nonspecific abnormal finding of lung field: Secondary | ICD-10-CM | POA: Insufficient documentation

## 2012-05-28 DIAGNOSIS — C8100 Nodular lymphocyte predominant Hodgkin lymphoma, unspecified site: Secondary | ICD-10-CM

## 2012-05-28 DIAGNOSIS — C8141 Lymphocyte-rich classical Hodgkin lymphoma, lymph nodes of head, face, and neck: Secondary | ICD-10-CM | POA: Insufficient documentation

## 2012-05-28 LAB — CMP (CANCER CENTER ONLY)
ALT(SGPT): 25 U/L (ref 10–47)
Albumin: 3.6 g/dL (ref 3.3–5.5)
CO2: 28 mEq/L (ref 18–33)
Chloride: 95 mEq/L — ABNORMAL LOW (ref 98–108)
Glucose, Bld: 153 mg/dL — ABNORMAL HIGH (ref 73–118)
Potassium: 4.6 mEq/L (ref 3.3–4.7)
Sodium: 139 mEq/L (ref 128–145)
Total Bilirubin: 0.6 mg/dl (ref 0.20–1.60)
Total Protein: 7.3 g/dL (ref 6.4–8.1)

## 2012-05-28 LAB — GLUCOSE, CAPILLARY: Glucose-Capillary: 158 mg/dL — ABNORMAL HIGH (ref 70–99)

## 2012-05-28 MED ORDER — FLUDEOXYGLUCOSE F - 18 (FDG) INJECTION
15.4000 | Freq: Once | INTRAVENOUS | Status: AC | PRN
  Administered 2012-05-28: 15.4 via INTRAVENOUS

## 2012-05-29 ENCOUNTER — Encounter: Payer: Self-pay | Admitting: Oncology

## 2012-05-29 NOTE — Progress Notes (Signed)
05/29/2012 I left a voice message on this patient's cell phone, RE: CT SCANS OF NECK, CHEST, ABDOMEN AND PELVIS HAVE BEEN RESCHEDULED FROM tomorrow to Friday morning, June 01, 2012 with a 8:15 am arrival time.  I explained to the patient in the message that the scans had not been authorized by her insurance company.  I left my name and phone number if the patient wants to call be back.  Renee Ramus [phone]

## 2012-05-30 ENCOUNTER — Other Ambulatory Visit (HOSPITAL_COMMUNITY): Payer: Self-pay

## 2012-06-01 ENCOUNTER — Other Ambulatory Visit (HOSPITAL_COMMUNITY): Payer: Medicaid Other

## 2012-06-01 ENCOUNTER — Other Ambulatory Visit (HOSPITAL_COMMUNITY): Payer: Self-pay

## 2012-06-01 ENCOUNTER — Other Ambulatory Visit: Payer: Self-pay | Admitting: Lab

## 2012-06-01 ENCOUNTER — Ambulatory Visit (HOSPITAL_BASED_OUTPATIENT_CLINIC_OR_DEPARTMENT_OTHER): Payer: Medicaid Other | Admitting: Oncology

## 2012-06-01 ENCOUNTER — Encounter: Payer: Self-pay | Admitting: Radiation Oncology

## 2012-06-01 ENCOUNTER — Telehealth: Payer: Self-pay | Admitting: Oncology

## 2012-06-01 VITALS — BP 153/94 | HR 86 | Temp 97.7°F | Ht 67.5 in | Wt 214.0 lb

## 2012-06-01 DIAGNOSIS — C8190 Hodgkin lymphoma, unspecified, unspecified site: Secondary | ICD-10-CM

## 2012-06-01 NOTE — Progress Notes (Signed)
31 year old female. Works at [employer].  Stage IIB Hodgkin's Lymphoma diagnosed 01/12/2012. 01/24/12 PET revealed hypermetabolic cervical, supraclavicular, and left axillary adenopathy. Completed 4 cycles of ABVD with Neulasta support on June 14. Zoladex injection was given prior to starting chemotherapy to preserve her ovarian function. Restaging scan and PET scheduled by [facility] for July 1st.   ZO:[id number] (childhood allergy) No indication of a pacemaker No hx of radiation therapy  Initial outpatient consult with Dr. Michell Heinrich on 03/22/2012.

## 2012-06-01 NOTE — Telephone Encounter (Signed)
Gave pt appt for August 2013 lab and PET Scan and MD after a few days

## 2012-06-01 NOTE — Progress Notes (Signed)
[name] [id number] [birth year] 31 y.o. 06/01/2012 1:10 PM  CC: Dr. Milford Cage ; Dr. Lurline Hare; Dr. Gaynelle Adu   Oncology Treatment Dates: 02/03/2012 through 05/11/2012 4 cycles of ABVD chemotherapy in full doses  Diagnosis: Stage IIB Hodgkin's lymphoma nodular sclerosing subtype     History of Present Illness:  Pleasant 31 year old woman who presented with an 8 month history of enlarging left axillary lymphadenopathy, fevers, and weight loss. She saw a number of physicians prior to being referred for a lymph node biopsy done on February 14 which was diagnostic for nodular sclerosis Hodgkin's disease. Initial CT scan done 01/24/2012 showed a cluster of bulky left axillary lymph nodes largest 2.7 cm, bulky bilateral supraclavicular lymphadenopathy largest 3.1 cm on the right. No pathologic left axillary nodes. However 1 lymph node in the right axilla hypermetabolic on PET scan. There was mild right paratracheal lymph node enlargement maximum dimension and 12 mm; no abdominal retroperitoneal or pelvic adenopathy. PET scan also done February 26 showed bilateral hypermetabolic level II and level V cervical lymphadenopathy maximum SUV of 11, bilateral bulky supraclavicular hypermetabolic nodes, bulky left axillary and subpectoral nodes on the left maximum SUV 8.1, "mild" hypermetabolic activity in small paratracheal lymph nodes and a left axillary node. No abdominal or pelvic abnormal activity. Unilateral bone marrow aspiration biopsy with no Hodgkin's involvement. No bone marrow activity on PET scan Baseline LDH normal at 188. ESR 36 mm normal up to 22. Acute hepatitis profile negative. HIV antibody negative. CMV and the EBV IgG elevated but not IgM. Pretreatment hemoglobin 12.6, white count 4800, 57% neutrophils 28% lymphocytes and platelets 323,000. Baseline ejection fraction 50-55% on echocardiogram 01/24/2012. She had clinical stage IIB disease. I asked radiation oncology  to evaluate her to see if she would be an eventual candidate for involved field radiation following chemotherapy and she was felt to be an acceptable candidate. She proceeded with 4 cycles of chemotherapy with with ABVD in standard doses. Overall she tolerated treatment very well. She had some delayed nausea but no significant vomiting. She developed no respiratory symptoms on the bleomycin. No cardiac symptoms on the Adriamycin. She is a diabetic. Glucoses were monitored on the steroid premedication.  She's had some problems this summer with air conditioning going out in her apartment. She is tired and not getting any sleep. She had 2 visits to the emergency Department for sinusitis.  A restaging PET scan was done in anticipation of today's visit on 05/28/2012. I personally reviewed the images. Overall if one looks at the total body images, there does not appear to be any significant activity in any area compared with the pretreatment study. However radiologist is reading mild hypermetabolic activity in a right peri tonsillar region "equivocal but asymmetric" maximum SUV 5.9. This area was not PET positive on the pretreatment study. Bulky lymphadenopathy down to a maximum dimension of 11 mm with "mild residual hypermetabolism SUV 2.8 previously 8.1". Mild anterior mediastinal soft tissue maximum SUV 4.0. New scattered subcentimeter pulmonary nodules bilaterally which are not PET avid. No activity in the abdomen and pelvis.  I had ordered a CT scan of the soft tissues of the neck and a diagnostic  CT scan of the chest abdomen and pelvis but authorization was declined by her insurance company [employer] I had a phone discussion first with a radiologist who did not even want to discuss the issue but put me be in touch with an oncologist who works for the company. I informed him that at  our institution a PET CT scan does not give complete images which I feel are essential for adequate evaluation to confirm  remission status. He was adamant that the PET/CT was sufficient and that I should review this with our radiologist to see why they are not doing a diagnostic CTs along with the PET scan. He would not authorize a diagnostic CT scan at this time despite the fact that I felt it was needed due to the additional findings on her exam today with residual palpable left cervical lymphadenopathy and the fact that the patient is going to be evaluated next week for involved field radiation. I still feel the CT scan is indicated and I agreed to disagree with the company oncologist with respect to the necessity for these images.    Medications:   Allergies:   Allergies  Allergen Reactions  . Penicillins Other (See Comments)    Childhood reaction    Physical Exam: Blood pressure 153/94, pulse 86, temperature 97.7 F (36.5 C), temperature source Oral, height 5' 7.5" (1.715 m), weight 214 lb (97.07 kg). General appearance: alert and cooperative Head and neck: Oropharynx with bilateral tonsillar enlargement. Tonsils do not appear inflamed. Pharynx any erythema or exudate. Lymph nodes: There is residual lymphadenopathy lower anterior neck approximately 2 cm with a smaller adjacent subcentimeter node. Resolved left axillary adenopathy and resolved bilateral supraclavicular adenopathy. Lung exam: Clear to auscultation resonant to percussion Cardiac exam: Regular rhythm no murmur gallop or rub Abdominal exam: Soft nontender no mass no organomegaly Extremities: No edema no calf tenderness Neurologic: She is alert and oriented. Cranial nerves intact. Motor strength 5 over 5. Reflexes 1+ symmetric. Coordination normal. Sensation intact to vibration.  Lab Results: Lab Results  Component Value Date   WBC 3.5* 05/11/2012   HGB 11.4* 05/11/2012   HCT 32.5* 05/11/2012   MCV 88.1 05/11/2012   PLT 244 05/11/2012     Chemistry      Component Value Date/Time   NA 139 05/28/2012 0818   NA 141 05/11/2012 0938   K 4.6  05/28/2012 0818   K 4.1 05/11/2012 0938   CL 95* 05/28/2012 0818   CL 105 05/11/2012 0938   CO2 28 05/28/2012 0818   CO2 27 05/11/2012 0938   BUN 11 05/28/2012 0818   BUN 8 05/11/2012 0938   CREATININE 0.8 05/28/2012 0818   CREATININE 0.72 05/11/2012 0938   CREATININE 0.67 10/08/2007 1250      Component Value Date/Time   CALCIUM 9.3 05/28/2012 0818   CALCIUM 9.3 05/11/2012 0938   ALKPHOS 71 05/28/2012 0818   ALKPHOS 75 05/11/2012 0938   AST 24 05/28/2012 0818   AST 13 05/11/2012 0938   ALT 13 05/11/2012 0938   BILITOT 0.60 05/28/2012 0818   BILITOT 0.5 05/11/2012 9604       Radiological Studies: chest X-ray: See discussion above  Impression: Stage  2B nodular sclerosing non-Hodgkin's lymphoma  I will need to review the images with the radiologist and address the issues of concern above. She has some residual palpable left cervical lymphadenopathy which is not PET avid. She has minimal residual activity in a markedly decreased lymph node mass in the left axilla down to SUV 2.8. She has some nonspecific activity in mediastinal soft tissue but not nodes.  She has some nonspecific activity in the right peritonsillar area not seen on initial study. 2 recent episodes of sinusitis may account for this.  Follow Up Plan:  She is due to have a simulation for  radiation treatment next week on July 10. If there are no other additional concerns after I reviewed these studies with the radiologist, we will proceed with radiation and then plan on a PET scan to follow radiation in about 2 months.    Levert Feinstein, MD 7/5/20131:10 PM

## 2012-06-06 ENCOUNTER — Ambulatory Visit
Admission: RE | Admit: 2012-06-06 | Discharge: 2012-06-06 | Disposition: A | Discharge status: Home / Self Care | Payer: Medicaid Other | Source: Ambulatory Visit | Attending: Radiation Oncology | Admitting: Radiation Oncology

## 2012-06-06 ENCOUNTER — Encounter: Payer: Self-pay | Admitting: Radiation Oncology

## 2012-06-06 VITALS — BP 173/117 | HR 88 | Temp 98.2°F | Resp 18 | Ht 67.0 in | Wt 219.0 lb

## 2012-06-06 DIAGNOSIS — C8190 Hodgkin lymphoma, unspecified, unspecified site: Secondary | ICD-10-CM

## 2012-06-06 MED ORDER — SODIUM CHLORIDE 0.9 % IJ SOLN
10.0000 mL | Freq: Once | INTRAMUSCULAR | Status: AC
  Administered 2012-06-06: 10 mL via INTRAVENOUS

## 2012-06-06 MED ORDER — HEPARIN SOD (PORK) LOCK FLUSH 100 UNIT/ML IV SOLN
500.0000 [IU] | Freq: Once | INTRAVENOUS | Status: AC
  Administered 2012-06-06: 500 [IU] via INTRAVENOUS

## 2012-06-06 NOTE — Progress Notes (Signed)
patient brought to nursing, flushe power port a cath right, with 10cc normal saline,still good blood return, then flushed 73ml/100units/ml ,deaccessed huber needle,bandaid applied over site, patient tolerated well,d/c home

## 2012-06-06 NOTE — Progress Notes (Signed)
Patient presents to the clinic today accompanied by her significant other for consultation with Dr. Michell Heinrich reference Hodgkin lymphoma left neck and left axilla. Patient is alert and oriented to person, place, and time. No distress noted. Steady gait noted. Pleasant affect noted. Patient denies pain at this time. Blood pressure elevated despite taking bp medication this morning. Patient reports she is scheduled to see a cardiologist follow completion of radiation treatment. Patient denies nausea, vomiting, headache, dizziness, diarrhea, diarrhea or constipation. Patient reports her weight remains stable. Patient denies difficulty eating. Patient reports difficulty sleeping related to hot flashes and the [facility] in her apartment being out. Patient reports working part time at [employer]. Patient reports her energy level is normal. Patient reports within the last few weeks she has had severe anxiety. Patient reports her heart frequently races and she has chest pain. Patient denies dyspnea or difficulty swallowing. Patient reports a nonproductive cough but, related it to asthma. Reported all findings to Dr. Michell Heinrich.

## 2012-06-06 NOTE — Progress Notes (Signed)
See progress note under physician encounter. 

## 2012-06-06 NOTE — Progress Notes (Addendum)
Department of Radiation Oncology  Phone:  [phone] Fax:        [phone]   Name: [name]   DOB: [birth year]  MRN: [id number]    Date: 06/06/2012  Follow Up Visit Note  Diagnosis: Stage IIB Hodgkin's lymphoma  Interval History: [name] presents today for routine followup.  He is feeling well and doing well. She tolerated her chemotherapy very well. She had a total of 4 cycles of ABVD. She is using a in the IUD for contraception. She has had a lot of anxiety related to finishing her treatment and starting radiation. She continues to work full time. She is accompanied by her boyfriend. She had a restaging PET scan performed on 05/28/2012 which showed bilateral cervical lymph nodes measuring up to 12 mm without residual hypermetabolic activity. Left axillary lymph nodes measured 11 mm with an SUV of 2.8. The anterior mediastinal mass had an SUV of 4. New scattered subcentimeter pulmonary nodules were noted bilaterally. Bilateral patchy lower lobe opacities with an SUV of 3.8 were also noted. Diffuse heterogeneous uptake in the osseous structures was thought to reflect chemotherapy. Asymmetric FDG uptake in the right peritonsillar region with an SUV of 5.9 was equivocal. This had not been seen previously.  Allergies:  Allergies  Allergen Reactions  . Penicillins Other (See Comments)    Childhood reaction    Medications:  Current Outpatient Prescriptions  Medication Sig Dispense Refill  . ergocalciferol (VITAMIN D2) 50000 UNITS capsule Take 50,000 Units by mouth every 7 (seven) days. Sunday      . hydrocortisone cream 1 % Apply 1 application topically 2 (two) times daily as needed. For eczema      . hydrOXYzine (ATARAX/VISTARIL) 25 MG tablet Take 25 mg by mouth 3 (three) times daily as needed.      Marland Kitchen lisinopril-hydrochlorothiazide (PRINZIDE,ZESTORETIC) 20-12.5 MG per tablet Take 1 tablet by mouth daily.      Marland Kitchen LORazepam (ATIVAN) 2 MG tablet Take 1 tablet (2 mg total)  by mouth every 6 (six) hours as needed for anxiety (nausea or sleep). For anxiety  30 tablet  3  . metFORMIN (GLUCOPHAGE) 500 MG tablet Take 500 mg by mouth 2 (two) times daily with a meal.        . mometasone (ELOCON) 0.1 % ointment Apply 0.1 % topically 2 (two) times daily.      . naproxen (NAPROSYN) 500 MG tablet Take 500 mg by mouth daily as needed. For pain.      Marland Kitchen ondansetron (ZOFRAN-ODT) 8 MG disintegrating tablet For nausea Zofran ODT 8mg  SL q 6h prn nausea/vomiting  25 tablet  prn  . zolpidem (AMBIEN) 5 MG tablet 1-2 tabs po hs prn sleep.  Script given to pt 01/30/12  30 tablet  4  . cyclobenzaprine (FLEXERIL) 10 MG tablet Take 10 mg by mouth 3 (three) times daily as needed.       . lidocaine-prilocaine (EMLA) cream Apply topically as needed. Apply to [facility] 1 hour prior to procedure  30 g  prn   No current facility-administered medications for this encounter.   Facility-Administered Medications Ordered in Other Encounters  Medication Dose Route Frequency Provider Last Rate Last Dose  . heparin lock flush 100 unit/mL  500 Units Intravenous Once Lurline Hare, MD   500 Units at 06/06/12 1515  . sodium chloride 0.9 % injection 10 mL  10 mL Intravenous Once Lurline Hare, MD   10 mL at 06/06/12 1420  . sodium chloride 0.9 % injection  10 mL  10 mL Intravenous Once Lurline Hare, MD   10 mL at 06/06/12 1515    Physical Exam:   height is 5\' 7"  (1.702 m) and weight is 219 lb (99.338 kg). Her oral temperature is 98.2 F (36.8 C). Her blood pressure is 173/117 and her pulse is 88. Her respiration is 18.  Is a pleasant female in no distress sitting comfortably examining table.  IMPRESSION: [name] is a 31 y.o. female has had an almost complete PET response to 4 cycles of chemotherapy. She is referred to proceed on with adjuvant radiation at this time.  PLAN:  We discussed the role of involved station radiation in the treatment of Hodgkin's disease patients. We discussed the process of  simulation and the making a mask. We discussed a 15-20 treatments as an outpatient. We discussed the possibility of skin darkening of redness hair loss sore throat and fatigue. We discussed long-term effects of treatment including but not limited to heart disease and breast cancer. We discussed annual MRI scans to screen for breast cancer. We discussed secondary malignancies. We discussed continued use of contraception during treatment. She has signed informed consent and agree to proceed forward. We are able to schedule her simulation for later this afternoon. I will be using I MR T4 sparing of her heart and breast. Without IMR T. and using conventional techniques I fear that given the size of her tumor volume but basically from the base of skull all the way through the mediastinum we'll have significant heart dose and we'll be treating a significant portion of her breast in the axillary tail to cover her axillary lymph nodes. Using I MR T. we can spare the heart as well as spare normal breast tissue thereby reducing dose to critical normal structures which can lead to chronic long-term side effects.  I have added to her CT scan on to our thoracic tumor board tomorrow for review of these pulmonary findings. Likely these are sequela of chemotherapy but I would like to be sure before we proceed on with treatment    Lurline Hare, MD

## 2012-06-06 NOTE — Addendum Note (Signed)
Encounter addended by: Lowella Petties, RN on: 06/06/2012  3:18 PM<BR>     Documentation filed: Orders, Inpatient Notes, Inpatient Document Flowsheet, Inpatient [facility]

## 2012-06-06 NOTE — Progress Notes (Signed)
Patient gave name,dob as identification, using sterile technique, accessed patient right power port a cath x 1 with 20g 1 inch power huber needle, with excellent blood return,flushed with 10cc Normal Saline flush per protocol, patient tolerated well, called Brian,therapist in Ct Sim, patient i ready,assisted with gown 2:23 PM

## 2012-06-07 NOTE — Progress Notes (Signed)
[facility] Radiation Oncology Complex Simulation/Treatment Planning/IMRT note   [name]  [id number] 06/06/2012    [birth year]  Stage IIB Hodgkin's Lymphoma   CONSENT VERIFIED:yes   SET UP: Patient is set-up supine   IMMOBILIZATION: The following immobilization is used:Aquaplast Mask   NARRATIVE:The patient was brought to the CT Simulation planning suite.  Identity was confirmed.  All relevant records and images related to the planned course of therapy were reviewed.  Then, the patient was positioned in a stable reproducible clinical set-up for radiation therapy using an aquaplast mask.  IV contrast was administered and CT images were obtained.  Skin markings were placed.  The CT images were loaded into the planning software where the target and avoidance structures were contoured.  The patient's previous PET scan was fused with the planning CT to aid in target delineation. The radiation prescription was entered and confirmed.   TREATMENT PLANNING NOTE:  Treatment planning then occurred. I have requested : Intensity Modulated Radiotherapy (IMRT) is medically necessary for this case for the following reason:  Dose homogeneity and treatment of a head and neck site.

## 2012-06-07 NOTE — Addendum Note (Signed)
Encounter addended by: Agnes Lawrence, RN on: 06/07/2012  3:01 PM<BR>     Documentation filed: Charges VN, Inpatient Document Flowsheet, Inpatient Patient Education

## 2012-06-08 ENCOUNTER — Ambulatory Visit
Admission: RE | Admit: 2012-06-08 | Discharge: 2012-06-08 | Disposition: A | Discharge status: Home / Self Care | Payer: Medicaid Other | Source: Ambulatory Visit | Attending: Radiation Oncology | Admitting: Radiation Oncology

## 2012-06-08 ENCOUNTER — Telehealth: Payer: Self-pay | Admitting: Radiation Oncology

## 2012-06-08 DIAGNOSIS — C8190 Hodgkin lymphoma, unspecified, unspecified site: Secondary | ICD-10-CM

## 2012-06-08 LAB — BASIC METABOLIC PANEL
Calcium: 9.1 mg/dL (ref 8.4–10.5)
Glucose, Bld: 137 mg/dL — ABNORMAL HIGH (ref 70–99)
Potassium: 4.4 mEq/L (ref 3.5–5.3)
Sodium: 140 mEq/L (ref 135–145)

## 2012-06-08 NOTE — Telephone Encounter (Signed)
Phoned patient and left message on her cell phone voice mail that labs were normal and to resume her Metformin. Also, phoned patient's mother, made her aware of normal lab values, and asked she ensure her daughter received message to resume Metformin. Mrs. Senaida Ores, mother, expressed appreciation for the call and promised to relay message. Also, she raved about the staff of the cancer center and how she appreciated the great care her daughter had received. Encouraged to call with needs.

## 2012-06-18 ENCOUNTER — Ambulatory Visit
Admission: RE | Admit: 2012-06-18 | Discharge: 2012-06-18 | Disposition: A | Discharge status: Home / Self Care | Payer: Medicaid Other | Source: Ambulatory Visit | Attending: Radiation Oncology | Admitting: Radiation Oncology

## 2012-06-18 ENCOUNTER — Encounter: Payer: Self-pay | Admitting: Radiation Oncology

## 2012-06-19 ENCOUNTER — Ambulatory Visit: Payer: Medicaid Other | Admitting: Radiation Oncology

## 2012-06-19 ENCOUNTER — Ambulatory Visit
Admission: RE | Admit: 2012-06-19 | Discharge: 2012-06-19 | Disposition: A | Discharge status: Home / Self Care | Payer: Medicaid Other | Source: Ambulatory Visit | Attending: Radiation Oncology | Admitting: Radiation Oncology

## 2012-06-19 DIAGNOSIS — C8190 Hodgkin lymphoma, unspecified, unspecified site: Secondary | ICD-10-CM

## 2012-06-19 NOTE — Progress Notes (Signed)
IMRT device note  Tomo segmentation was performed on [name].  She is being treated for Hodgkin's disease.  A total of 42.3 gantry rotations and 13.7 field widths were required to deliver the planned dose. This will comprise 1 IMRT device.

## 2012-06-19 NOTE — Progress Notes (Signed)
Weekly Management Note Current Dose: 3.6  Gy  Projected Dose: 36 Gy   Narrative:  The patient presents for routine under treatment assessment.  CBCT/MVCT images/Port film x-rays were reviewed.  The chart was checked. Doing well. No complaints. Post sim education performed today.   Physical Findings: Weight:  . Unchanged  Impression:  The patient is tolerating radiation.  Plan:  Continue treatment as planned. Add biafene to neck, axilla, chest.

## 2012-06-20 ENCOUNTER — Ambulatory Visit
Admission: RE | Admit: 2012-06-20 | Discharge: 2012-06-20 | Disposition: A | Discharge status: Home / Self Care | Payer: Medicaid Other | Source: Ambulatory Visit | Attending: Radiation Oncology | Admitting: Radiation Oncology

## 2012-06-20 ENCOUNTER — Emergency Department (HOSPITAL_COMMUNITY)
Admission: EM | Admit: 2012-06-20 | Discharge: 2012-06-20 | Disposition: A | Discharge status: Home / Self Care | Payer: Medicaid Other | Attending: Emergency Medicine | Admitting: Emergency Medicine

## 2012-06-20 ENCOUNTER — Encounter (HOSPITAL_COMMUNITY): Payer: Self-pay | Admitting: *Deleted

## 2012-06-20 DIAGNOSIS — Z87898 Personal history of other specified conditions: Secondary | ICD-10-CM | POA: Insufficient documentation

## 2012-06-20 DIAGNOSIS — C8190 Hodgkin lymphoma, unspecified, unspecified site: Secondary | ICD-10-CM | POA: Insufficient documentation

## 2012-06-20 DIAGNOSIS — I10 Essential (primary) hypertension: Secondary | ICD-10-CM | POA: Insufficient documentation

## 2012-06-20 DIAGNOSIS — Z87891 Personal history of nicotine dependence: Secondary | ICD-10-CM | POA: Insufficient documentation

## 2012-06-20 DIAGNOSIS — E119 Type 2 diabetes mellitus without complications: Secondary | ICD-10-CM | POA: Insufficient documentation

## 2012-06-20 DIAGNOSIS — B351 Tinea unguium: Secondary | ICD-10-CM

## 2012-06-20 DIAGNOSIS — B49 Unspecified mycosis: Secondary | ICD-10-CM | POA: Insufficient documentation

## 2012-06-20 MED ORDER — TERBINAFINE HCL 1 % EX CREA: TOPICAL_CREAM | Freq: Two times a day (BID) | CUTANEOUS | Status: DC

## 2012-06-20 MED ORDER — BIAFINE EX EMUL
Freq: Every day | CUTANEOUS | Status: DC
  Administered 2012-06-20: 14:00:00 via TOPICAL

## 2012-06-20 NOTE — ED Notes (Signed)
Pt reports toe pain, nail lifting up off of R great toe, pus, discoloration at base of nail. Pt sts she finished chemo for NonHodgkins lymphoma in June, now receiving radiation and was told this could be a side effect of the chemo. Also diabetic neuropathy.

## 2012-06-20 NOTE — ED Provider Notes (Signed)
History     CSN: [id number]  Arrival date & time 06/20/12  1103   First MD Initiated Contact with Patient 06/20/12 1142      No chief complaint on file.   (Consider location/radiation/quality/duration/timing/severity/associated sxs/prior treatment) HPI  31 year old female presents c/o toe pain. Patient recently finished chemotherapy treatment for non-Hodgkin lymphoma in June. She is currently receiving radiation. She presents complaining of her nail on R great toe lifting off.  She also notice odor and mild discharge whenever she remove her socks.  Denies any significant pain, itch, or joint discomfort.  Denies any recent injury, fever, or rash.  Was told by oncologist that she may lose her nails due to treatment.  Denies similar changes to any other nails.   Past Medical History  Diagnosis Date  . Hypertension   . Diabetes mellitus   . Complication of anesthesia SLOW TO WAKE UP AFTER C SECTION  . Hodgkin lymphoma 01/18/2012  . Benign essential HTN 01/19/2012  . DM II (diabetes mellitus, type II), controlled 01/19/2012  . Eczema 01/19/2012  . Muscle spasm of back 03/09/2012  . hodgkins lymphoma dx'd 01/16/12    lt axilla and left neck   . S/P chemotherapy, time since 4-12 weeks 05/11/2012    4 cycles of ABVD with neulasta suppor and zoladex  . Asthma     seasonal    Past Surgical History  Procedure Date  . Dilation and curettage of uterus   . Cesarean section   . Axillary lymph node biopsy 12/2011    left  . Portacath placement 01/25/2012    Procedure: INSERTION PORT-A-CATH;  Surgeon: Atilano Ina, MD,FACS;  Location: [facility];  Service: General;  Laterality: Right;  right subclavian    Family History  Problem Relation Age of Onset  . Diabetes Maternal Grandmother   . Heart disease Maternal Grandfather   . Cancer Neg Hx     History  Substance Use Topics  . Smoking status: Former Smoker    Quit date: 11/19/2006  . Smokeless tobacco: Never Used  . Alcohol Use: No    OB  History    Grav Para Term Preterm Abortions TAB SAB Ect Mult Living   3 1 1  2  0 2 0 0 1      Review of Systems  Constitutional: Negative for fever.  Skin: Negative for rash.  Neurological: Negative for numbness.  All other systems reviewed and are negative.    Allergies  Penicillins  Home Medications   Current Outpatient Rx  Name Route Sig Dispense Refill  . ERGOCALCIFEROL 50000 UNITS PO CAPS Oral Take 50,000 Units by mouth every 7 (seven) days. Sunday    . HYDROCORTISONE 1 % EX CREA Topical Apply 1 application topically 2 (two) times daily as needed. For eczema    . LISINOPRIL-HYDROCHLOROTHIAZIDE 20-12.5 MG PO TABS Oral Take 1 tablet by mouth daily.    Marland Kitchen LORAZEPAM 2 MG PO TABS Oral Take 1 tablet (2 mg total) by mouth every 6 (six) hours as needed for anxiety (nausea or sleep). For anxiety 30 tablet 3    This script was given to pt on 01/18/12.  . METFORMIN HCL 500 MG PO TABS Oral Take 500 mg by mouth 2 (two) times daily with a meal.      . ONDANSETRON 8 MG PO TBDP  For nausea Zofran ODT 8mg  SL q 6h prn nausea/vomiting 25 tablet prn    This script was given to pt 01/18/12.  Marland Kitchen  ZOLPIDEM TARTRATE 5 MG PO TABS Oral Take 5-10 mg by mouth at bedtime as needed. 1-2 tabs po hs prn sleep.  Script given to pt 01/30/12      BP 143/97  Pulse 78  Temp 98.5 F (36.9 C) (Oral)  Resp 18  SpO2 100%  Physical Exam  Nursing note and vitals reviewed. Constitutional: She appears well-nourished.  HENT:  Head: Atraumatic.  Eyes: Conjunctivae are normal.  Neck: Neck supple.  Musculoskeletal: Normal range of motion.       R foot: R great toe with toe nail lifted and nearly falling off.  No paronychia.  No fb.  Non tender.  No joint involvement.  Neurological: She is alert.  Skin: Skin is warm. No rash noted.    ED Course  Procedures (including critical care time)  Labs Reviewed - No data to display No results found.   No diagnosis found.  1. Toe nail fungus  MDM  R great toe  lifting, likely toe fungus on affected toe.  No evidence of bacterial infection or paronychia.  Plan to treat with antifungal cream and f/u with oncologist . Pt voice understanding.  Pt aware that her nail will fall off within 1-2 weeks.  Recommend keeping toe dry.          Fayrene Helper, PA-C 06/20/12 1321

## 2012-06-20 NOTE — ED Provider Notes (Signed)
Medical screening examination/treatment/procedure(s) were performed by non-physician practitioner and as supervising physician I was immediately available for consultation/collaboration.   Loyola Santino A Neave Lenger, MD 06/20/12 1516 

## 2012-06-20 NOTE — Progress Notes (Signed)
Late entry from 06/19/2012 at 1730. Received patient in the clinic tonight accompanied by her friend for post sim education with Sam, Charity fundraiser. Patient alert and oriented to person, place, and time. No distress noted. Steady gait noted. Pleasant affect noted. Patient denies pain at this time. Oriented patient to staff and routine of the clinic. Provided patient with RADIATION THERAPY AND YOU handbook then, reviewed pertinent information with the patient. Also, provided patient with Biafine cream and directed upon use. Educated patient on potential side effects and management. Provided patient with this writer's business card and encouraged to call with needs. Patient verbalized understanding of all things reviewed.

## 2012-06-21 ENCOUNTER — Ambulatory Visit: Admission: RE | Admit: 2012-06-21 | Payer: Medicaid Other | Source: Ambulatory Visit

## 2012-06-22 ENCOUNTER — Ambulatory Visit
Admission: RE | Admit: 2012-06-22 | Discharge: 2012-06-22 | Disposition: A | Discharge status: Home / Self Care | Payer: Medicaid Other | Source: Ambulatory Visit | Attending: Radiation Oncology | Admitting: Radiation Oncology

## 2012-06-25 ENCOUNTER — Ambulatory Visit
Admission: RE | Admit: 2012-06-25 | Discharge: 2012-06-25 | Disposition: A | Discharge status: Home / Self Care | Payer: Medicaid Other | Source: Ambulatory Visit | Attending: Radiation Oncology | Admitting: Radiation Oncology

## 2012-06-26 ENCOUNTER — Encounter: Payer: Self-pay | Admitting: Radiation Oncology

## 2012-06-26 ENCOUNTER — Ambulatory Visit
Admission: RE | Admit: 2012-06-26 | Discharge: 2012-06-26 | Disposition: A | Discharge status: Home / Self Care | Payer: Medicaid Other | Source: Ambulatory Visit | Attending: Radiation Oncology | Admitting: Radiation Oncology

## 2012-06-26 VITALS — BP 170/116 | HR 82 | Resp 18 | Wt 222.4 lb

## 2012-06-26 DIAGNOSIS — C8190 Hodgkin lymphoma, unspecified, unspecified site: Secondary | ICD-10-CM

## 2012-06-26 NOTE — Progress Notes (Signed)
Patient presents to the clinic today unaccompanied for an under treat visit with Dr. Roselind Messier. Patient is alert and oriented to person, place, and time. No distress noted. Steady gait noted. Pleasant affect noted. Patient denies pain at this time. Patient reports an occasional cough with white to yellow sputum. Patient reports dry mouth. Patient denies shortness of breath. Patient denies sore throat. Patient denies skin changes. Patient reports using Biafine as directed. Reported all findings to Dr. Roselind Messier.

## 2012-06-26 NOTE — Progress Notes (Signed)
Weekly Management Note Current Dose:  10.8 Gy  Projected Dose: 36.0  Gy   Narrative:  The patient presents for routine under treatment assessment.  CBCT/MVCT images/Port film x-rays were reviewed.  The chart was checked.  She continues to tolerate the treatments well at this time. Her only change thus far is dry mouth. Patient denies any taste changes or swallowing difficulties.  Physical Findings: Weight: 222 lb 6.4 oz (100.88 kg). No palpable cervical supraclavicular or axillary adenopathy. The lungs are clear to auscultation. The heart has a regular rhythm and rate.  The patient's blood pressure is elevated today and I recommended she follow this closely and discuss with her primary care physician if this continues to be a problem.  Impression:  The patient is tolerating radiation.  Plan:  Continue treatment as planned.   -----------------------------------  Billie Lade, PhD, MD

## 2012-06-27 ENCOUNTER — Ambulatory Visit
Admission: RE | Admit: 2012-06-27 | Discharge: 2012-06-27 | Disposition: A | Discharge status: Home / Self Care | Payer: Medicaid Other | Source: Ambulatory Visit | Attending: Radiation Oncology | Admitting: Radiation Oncology

## 2012-06-28 ENCOUNTER — Ambulatory Visit
Admission: RE | Admit: 2012-06-28 | Discharge: 2012-06-28 | Disposition: A | Discharge status: Home / Self Care | Payer: Medicaid Other | Source: Ambulatory Visit | Attending: Radiation Oncology | Admitting: Radiation Oncology

## 2012-06-29 ENCOUNTER — Ambulatory Visit
Admission: RE | Admit: 2012-06-29 | Discharge: 2012-06-29 | Disposition: A | Discharge status: Home / Self Care | Payer: Medicaid Other | Source: Ambulatory Visit | Attending: Radiation Oncology | Admitting: Radiation Oncology

## 2012-06-29 ENCOUNTER — Encounter: Payer: Self-pay | Admitting: Radiation Oncology

## 2012-06-29 VITALS — BP 152/104 | HR 86 | Resp 18 | Wt 222.6 lb

## 2012-06-29 DIAGNOSIS — C8190 Hodgkin lymphoma, unspecified, unspecified site: Secondary | ICD-10-CM

## 2012-06-29 NOTE — Progress Notes (Signed)
Patient presents to the clinic today accompanied by her significant other for an under treat visit with Dr. Mitzi Hansen. Patient is requesting to be seen because of persistent cough. Patient is alert and oriented to person, place, and time. No distress noted. Steady gait noted. Pleasant affect noted. Patient denies pain at this time. Patient reports a persistent dry cough that is keeping her awake at night. Patient requesting medication to relieve cough so that she can sleep. Patient reports taste changes. Patient reports that "everything taste like dirt." Patient denies a sore throat. Patient denies difficulty swallowing. Reported all findings to Dr. Mitzi Hansen.

## 2012-06-29 NOTE — Progress Notes (Signed)
   Department of Radiation Oncology  Phone:  [phone] Fax:        [phone]  Weekly Treatment Note    Name: [name] Date: 06/29/2012 MRN: [id number] DOB: 1981-10-27   Current dose: 16.2 Gy  Current fraction: 9   MEDICATIONS: Current Outpatient Prescriptions  Medication Sig Dispense Refill  . emollient (BIAFINE) cream Apply topically as needed.      . ergocalciferol (VITAMIN D2) 50000 UNITS capsule Take 50,000 Units by mouth every 7 (seven) days. Sunday      . hydrocortisone cream 1 % Apply 1 application topically 2 (two) times daily as needed. For eczema      . lisinopril-hydrochlorothiazide (PRINZIDE,ZESTORETIC) 20-12.5 MG per tablet Take 1 tablet by mouth daily.      Marland Kitchen LORazepam (ATIVAN) 2 MG tablet Take 1 tablet (2 mg total) by mouth every 6 (six) hours as needed for anxiety (nausea or sleep). For anxiety  30 tablet  3  . metFORMIN (GLUCOPHAGE) 500 MG tablet Take 500 mg by mouth 2 (two) times daily with a meal.        . ondansetron (ZOFRAN-ODT) 8 MG disintegrating tablet For nausea Zofran ODT 8mg  SL q 6h prn nausea/vomiting  25 tablet  prn  . terbinafine (LAMISIL AT) 1 % cream Apply topically 2 (two) times daily.  30 g  0  . zolpidem (AMBIEN) 5 MG tablet Take 5-10 mg by mouth at bedtime as needed. 1-2 tabs po hs prn sleep.  Script given to pt 01/30/12         ALLERGIES: Penicillins   LABORATORY DATA:  Lab Results  Component Value Date   WBC 3.5* 05/11/2012   HGB 11.4* 05/11/2012   HCT 32.5* 05/11/2012   MCV 88.1 05/11/2012   PLT 244 05/11/2012   Lab Results  Component Value Date   NA 140 06/08/2012   K 4.4 06/08/2012   CL 103 06/08/2012   CO2 29 06/08/2012   Lab Results  Component Value Date   ALT 13 05/11/2012   AST 24 05/28/2012   ALKPHOS 71 05/28/2012   BILITOT 0.60 05/28/2012     NARRATIVE: [name] was seen today for weekly treatment management. The chart was checked and the patient's films were reviewed. Patient is complaining of a  persistent cough. This is most troublesome at night and has been keeping her up. She has taken a cough drop or 2 she indicates that but really has not taking anything at this point for this. She also does note a change in taste. She denies any esophagitis at this time.   PHYSICAL EXAMINATION: weight is 222 lb 9.6 oz (100.971 kg). Her blood pressure is 152/104 and her pulse is 86. Her respiration is 18.        ASSESSMENT: The patient is doing satisfactorily with treatment. Persistent dry cough.  PLAN: We will continue with the patient's radiation treatment as planned. I recommended for the patient to begin taking Robitussin cough. She is to then let us know if this does not alleviate her symptoms. No esophagitis at this time but we will need to continue to follow this.

## 2012-07-02 ENCOUNTER — Ambulatory Visit: Payer: Medicaid Other

## 2012-07-02 ENCOUNTER — Ambulatory Visit
Admission: RE | Admit: 2012-07-02 | Discharge: 2012-07-02 | Disposition: A | Discharge status: Home / Self Care | Payer: Medicaid Other | Source: Ambulatory Visit | Attending: Radiation Oncology | Admitting: Radiation Oncology

## 2012-07-02 ENCOUNTER — Encounter: Payer: Self-pay | Admitting: Radiation Oncology

## 2012-07-02 VITALS — BP 160/106 | HR 77 | Resp 18 | Wt 212.5 lb

## 2012-07-02 DIAGNOSIS — C8190 Hodgkin lymphoma, unspecified, unspecified site: Secondary | ICD-10-CM

## 2012-07-02 DIAGNOSIS — K208 Other esophagitis without bleeding: Secondary | ICD-10-CM

## 2012-07-02 MED ORDER — HYDROCODONE-ACETAMINOPHEN 7.5-500 MG/15ML PO SOLN: 15.0000 mL | Freq: Four times a day (QID) | ORAL | Status: AC | PRN

## 2012-07-02 MED ORDER — SUCRALFATE 1 GM/10ML PO SUSP: 1.0000 g | Freq: Four times a day (QID) | ORAL | Status: DC

## 2012-07-02 NOTE — Progress Notes (Signed)
Weekly Management Note:  Site:Neck/Med./Axilla Current Dose:  1800  cGy Projected Dose: 3600  cGy  Narrative: The patient is seen today for routine under treatment assessment. CBCT/MVCT images/port films were reviewed. The chart was reviewed.   She seen today complaining of severe pain on swallowing. She's lost approximately 10 pounds over the past 4 days. She continues to work.  Physical Examination:  Filed Vitals:   07/02/12 1817  BP: 160/106  Pulse: 77  Resp: 18  .  Weight: 212 lb 8 oz (96.389 kg). Oral cavity and oropharynx are unremarkable to inspection. No candidiasis.  Impression: Tolerating radiation therapy well, with the exception of radiation esophagitis. I'm starting her on Carafate slurry and also giving her Lortab elixir to take when necessary. I cautioned her about constipation.  Plan: Continue radiation therapy as planned.

## 2012-07-02 NOTE — Progress Notes (Signed)
Patient presents to the clinic today unaccompanied for a PUT with Dr. Dayton Scrape. Patient requesting to be seen for a severe sore throat. Patient is alert and oriented to person, place, and time. No distress noted. Steady gait noted. Pleasant affect noted. Patient reports pain when she swallows. Patient reports when she swallows it feels like someone is taking a rake down her throat. Also, patient reports that she feels like the inside of her mouth is swollen. Patient reports she had bitten the inside of her right cheek several times over the weekend because of the edema. Patient reports that the only thing she has been able to swallow since Wednesday is ice cream. Patient reports her dry persistent cough is worse. Ten pound weight loss noted since 8/2. Patient reports that everything she eats except super sweet foods are bitter or sour. Patient feels like even if she could swallow she still wouldn't haven't have much of an appetite. Reported all findings to Dr. Dayton Scrape.

## 2012-07-03 ENCOUNTER — Ambulatory Visit
Admission: RE | Admit: 2012-07-03 | Discharge: 2012-07-03 | Disposition: A | Discharge status: Home / Self Care | Payer: Medicaid Other | Source: Ambulatory Visit | Attending: Radiation Oncology | Admitting: Radiation Oncology

## 2012-07-04 ENCOUNTER — Ambulatory Visit
Admission: RE | Admit: 2012-07-04 | Discharge: 2012-07-04 | Disposition: A | Discharge status: Home / Self Care | Payer: Medicaid Other | Source: Ambulatory Visit | Attending: Radiation Oncology | Admitting: Radiation Oncology

## 2012-07-04 ENCOUNTER — Encounter: Payer: Self-pay | Admitting: Radiation Oncology

## 2012-07-04 VITALS — Wt 211.4 lb

## 2012-07-04 DIAGNOSIS — C8190 Hodgkin lymphoma, unspecified, unspecified site: Secondary | ICD-10-CM

## 2012-07-04 NOTE — Progress Notes (Addendum)
Patient presents to the clinic today unaccompanied for a PUT with Dr. Michell Heinrich. Patient is alert and oriented to person, place, and time. No distress noted. Steady gait noted. Pleasant affect noted. Patient reports pain when she swallows. Patient reports when she swallows it feels like someone is taking a rake down her throat. Also, patient reports that she feels like the inside of her mouth is swollen. Patient reports she had bitten the inside of her right cheek several times over the weekend because of the edema. Patient reports that the only thing she has been able to swallow since Wednesday of last week is ice cream and oatmeal. Patient reports her dry persistent cough is only slightly better. Eleven pound weight loss noted since 7/30. Patient reports that everything she eats except super sweet foods are bitter or sour. Patient feels like even if she could swallow she still wouldn't haven't have much of an appetite. Patient reports using carafate and lortab, as directed. Patient reports her mother is picking up boost today for her to try. No skin changes noted in treatment field. Patient continues to use Biafine as directed. Reported all findings to Dr. Michell Heinrich.

## 2012-07-04 NOTE — Progress Notes (Signed)
Weekly Management Note Current Dose:21.6 Gy  Projected Dose:36 Gy   Narrative:  The patient presents for routine under treatment assessment.  CBCT/MVCT images/Port film x-rays were reviewed.  The chart was checked. Mouth feels dry and inside is peeling. No taste for anything but sweets (which raise her blood sugar). Mom is picking up some boost for her. Sleeping better with ambien. carafate is helping dysphagia.   Physical Findings:  Unchanged. No thrush. Moist mucus membranes.  Vitals: There were no vitals filed for this visit. Weight:  Wt Readings from Last 3 Encounters:  07/04/12 211 lb 6.4 oz (95.89 kg)  07/02/12 212 lb 8 oz (96.389 kg)  06/29/12 222 lb 9.6 oz (100.971 kg)   Lab Results  Component Value Date   WBC 3.5* 05/11/2012   HGB 11.4* 05/11/2012   HCT 32.5* 05/11/2012   MCV 88.1 05/11/2012   PLT 244 05/11/2012   Lab Results  Component Value Date   CREATININE 0.76 06/08/2012   BUN 12 06/08/2012   NA 140 06/08/2012   K 4.4 06/08/2012   CL 103 06/08/2012   CO2 29 06/08/2012     Impression:  The patient is tolerating radiation.  Plan:  Continue treatment as planned. Continue carafate prn and lortab prn. Consider dietician consult if weight loss continues.

## 2012-07-05 ENCOUNTER — Ambulatory Visit
Admission: RE | Admit: 2012-07-05 | Discharge: 2012-07-05 | Disposition: A | Discharge status: Home / Self Care | Payer: Medicaid Other | Source: Ambulatory Visit | Attending: Radiation Oncology | Admitting: Radiation Oncology

## 2012-07-06 ENCOUNTER — Ambulatory Visit
Admission: RE | Admit: 2012-07-06 | Discharge: 2012-07-06 | Disposition: A | Discharge status: Home / Self Care | Payer: Medicaid Other | Source: Ambulatory Visit | Attending: Radiation Oncology | Admitting: Radiation Oncology

## 2012-07-09 ENCOUNTER — Ambulatory Visit
Admission: RE | Admit: 2012-07-09 | Discharge: 2012-07-09 | Disposition: A | Discharge status: Home / Self Care | Payer: Medicaid Other | Source: Ambulatory Visit | Attending: Radiation Oncology | Admitting: Radiation Oncology

## 2012-07-10 ENCOUNTER — Ambulatory Visit
Admission: RE | Admit: 2012-07-10 | Discharge: 2012-07-10 | Disposition: A | Discharge status: Home / Self Care | Payer: Medicaid Other | Source: Ambulatory Visit | Attending: Radiation Oncology | Admitting: Radiation Oncology

## 2012-07-10 ENCOUNTER — Encounter: Payer: Self-pay | Admitting: Radiation Oncology
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
## 2012-07-11 ENCOUNTER — Ambulatory Visit
Admission: RE | Admit: 2012-07-11 | Discharge: 2012-07-11 | Disposition: A | Discharge status: Home / Self Care | Payer: Medicaid Other | Source: Ambulatory Visit | Attending: Radiation Oncology | Admitting: Radiation Oncology

## 2012-07-12 ENCOUNTER — Ambulatory Visit
Admission: RE | Admit: 2012-07-12 | Discharge: 2012-07-12 | Disposition: A | Discharge status: Home / Self Care | Payer: Medicaid Other | Source: Ambulatory Visit | Attending: Radiation Oncology | Admitting: Radiation Oncology

## 2012-07-12 ENCOUNTER — Encounter: Payer: Self-pay | Admitting: Radiation Oncology

## 2012-07-13 ENCOUNTER — Ambulatory Visit
Admission: RE | Admit: 2012-07-13 | Discharge: 2012-07-13 | Disposition: A | Discharge status: Home / Self Care | Payer: Medicaid Other | Source: Ambulatory Visit | Attending: Radiation Oncology | Admitting: Radiation Oncology

## 2012-07-16 ENCOUNTER — Ambulatory Visit
Admission: RE | Admit: 2012-07-16 | Discharge: 2012-07-16 | Disposition: A | Discharge status: Home / Self Care | Payer: Medicaid Other | Source: Ambulatory Visit | Attending: Radiation Oncology | Admitting: Radiation Oncology

## 2012-07-16 ENCOUNTER — Encounter: Payer: Self-pay | Admitting: Radiation Oncology

## 2012-07-16 VITALS — BP 123/85 | HR 88 | Resp 18 | Wt 199.6 lb

## 2012-07-16 DIAGNOSIS — C8190 Hodgkin lymphoma, unspecified, unspecified site: Secondary | ICD-10-CM

## 2012-07-16 NOTE — Progress Notes (Signed)
   Department of Radiation Oncology  Phone:  [phone] Fax:        [phone]  Weekly Treatment Note    Name: [name] Date: 07/16/2012 MRN: [id number] DOB: [birth year]   Current dose: 34.2 Gy  Current fraction: 19   MEDICATIONS: Current Outpatient Prescriptions  Medication Sig Dispense Refill  . emollient (BIAFINE) cream Apply topically as needed.      . ergocalciferol (VITAMIN D2) 50000 UNITS capsule Take 50,000 Units by mouth every 7 (seven) days. Sunday      . hydrocortisone cream 1 % Apply 1 application topically 2 (two) times daily as needed. For eczema      . lisinopril-hydrochlorothiazide (PRINZIDE,ZESTORETIC) 20-12.5 MG per tablet Take 1 tablet by mouth daily.      Marland Kitchen LORazepam (ATIVAN) 2 MG tablet Take 1 tablet (2 mg total) by mouth every 6 (six) hours as needed for anxiety (nausea or sleep). For anxiety  30 tablet  3  . metFORMIN (GLUCOPHAGE) 500 MG tablet Take 500 mg by mouth 2 (two) times daily with a meal.        . ondansetron (ZOFRAN-ODT) 8 MG disintegrating tablet For nausea Zofran ODT 8mg  SL q 6h prn nausea/vomiting  25 tablet  prn  . sucralfate (CARAFATE) 1 GM/10ML suspension Take 10 mLs (1 g total) by mouth 4 (four) times daily.  420 mL  1  . terbinafine (LAMISIL AT) 1 % cream Apply topically 2 (two) times daily.  30 g  0  . zolpidem (AMBIEN) 5 MG tablet Take 5-10 mg by mouth at bedtime as needed. 1-2 tabs po hs prn sleep.  Script given to pt 01/30/12         ALLERGIES: Penicillins   LABORATORY DATA:  Lab Results  Component Value Date   WBC 3.5* 05/11/2012   HGB 11.4* 05/11/2012   HCT 32.5* 05/11/2012   MCV 88.1 05/11/2012   PLT 244 05/11/2012   Lab Results  Component Value Date   NA 140 06/08/2012   K 4.4 06/08/2012   CL 103 06/08/2012   CO2 29 06/08/2012   Lab Results  Component Value Date   ALT 13 05/11/2012   AST 24 05/28/2012   ALKPHOS 71 05/28/2012   BILITOT 0.60 05/28/2012     NARRATIVE: [name] was seen today for weekly  treatment management. The chart was checked and the patient's films were reviewed. The patient will complete her final fraction today which will correspond to 36 gray. She is seen prior to her last treatment. The patient indicates that she has been doing very well. She relates no difficulties with esophagitis. Her primary complaint is that of a change in taste. She is drinking boost, approximately one to 2 cans per day. Her skin has done well.  PHYSICAL EXAMINATION: weight is 199 lb 9.6 oz (90.538 kg). Her blood pressure is 123/85 and her pulse is 88. Her respiration is 18.        ASSESSMENT: The patient is doing satisfactorily with treatment. She finished his today.  PLAN: The patient will followup with Dr. Michell Heinrich as scheduled. It appears that she is done very well with her treatment.

## 2012-07-16 NOTE — Progress Notes (Signed)
  Radiation Oncology         [phone] ________________________________  Name: [name] MRN: [id number]  Date: 07/16/2012  DOB: [birth year]  End of Treatment Note  Diagnosis:   Stage IIB Hodgkin's Disease  Indication for treatment:  Curative       Radiation treatment dates:   06/18/2012-07/16/2012  Site/dose:   Involved field including bilateral neck, left axilla and mediastinum / 36 Gy at 1.8 Gray per fraction x 25 fractions.  Beams/energy:   Helical IMRT with 6 MV photons.  Narrative: The patient tolerated radiation treatment relatively well.   She had some fatigue, loss of appetite and weight loss.  She was able to continue working through treatment, however.  Plan: The patient has completed radiation treatment. The patient will return to radiation oncology clinic for routine followup in one month. I advised them to call or return sooner if they have any questions or concerns related to their recovery or treatment.  ------------------------------------------------  Lurline Hare, MD

## 2012-07-16 NOTE — Progress Notes (Signed)
Patient present to the clinic today unaccompanied for an under treat visit with Dr. Mitzi Hansen. Patient completes treatment today. Provided patient with an appointment card to return in one month for follow up.  Patient is alert and oriented to person, place, and time. No distress noted. Steady gait noted. Pleasant affect noted. Patient denies pain at this time. Patient reports pain when she swallowed. Patient reports decrease in energy level. 22 pound weight lost noted since 06/26/2012. Patient reports taking in liquids by mouth. Patient reports supplementing with 1-2 cans of boost per day. Patient reports taking hydrocodone prior to bed to relieve cough. Patient reports dry cough continues during the day. Patient reports dry mouth. Patient denies shortness of breath. Patient denies nausea, vomiting, or headache. Patient reports she hasn't had a bowel movement since Friday. Reported all findings to Dr. Mitzi Hansen.

## 2012-07-20 ENCOUNTER — Encounter: Payer: Self-pay | Admitting: Oncology

## 2012-07-20 NOTE — Progress Notes (Unsigned)
07/20/2012 I spoke with [name]: Her PET SCAN has been cancelled due to denial by her insurance.  She has my phone number and once it is approved or the procedure changed by Dr. Cyndie Chime I will contact her with the new instructions.  Patient voiced understanding.  [name] [phone]

## 2012-07-20 NOTE — Progress Notes (Signed)
07/20/2012 Good Morning Dr. Cyndie Chime,  I have received a denial for this patient's PET SCAN scheduled for Monday morning, July 23, 2012 @ 8:30 am.  I have enclosed the information if you so desire to call on this denial.  Unfortunately I will have to make [facility]uc [facility] aware by 2:30 this afternoon if I have an authorization or not.  I can call and reschedule this out  Until the following week if you desire.  This is the same young lady that had her CT SCANS Denied.  I have placed the official denial on your desk and informed Jan, Charity fundraiser.  Evelyne Makepeace ID# [id number] Q DOB: [birth year]  [phone] opt. 4  I await your decision.  [name] [phone]

## 2012-07-23 ENCOUNTER — Other Ambulatory Visit: Payer: Medicaid Other | Admitting: Lab

## 2012-07-23 ENCOUNTER — Other Ambulatory Visit (HOSPITAL_COMMUNITY): Payer: Medicaid Other

## 2012-07-24 ENCOUNTER — Emergency Department (HOSPITAL_COMMUNITY): Payer: Medicaid Other

## 2012-07-24 ENCOUNTER — Inpatient Hospital Stay (HOSPITAL_COMMUNITY)
Admission: EM | Admit: 2012-07-24 | Discharge: 2012-08-02 | DRG: 194 | Disposition: A | Discharge status: Home / Self Care | Payer: Medicaid Other | Attending: Internal Medicine | Admitting: Internal Medicine

## 2012-07-24 ENCOUNTER — Encounter (HOSPITAL_COMMUNITY): Payer: Self-pay | Admitting: Emergency Medicine

## 2012-07-24 DIAGNOSIS — R053 Chronic cough: Secondary | ICD-10-CM

## 2012-07-24 DIAGNOSIS — M5380 Other specified dorsopathies, site unspecified: Secondary | ICD-10-CM | POA: Diagnosis present

## 2012-07-24 DIAGNOSIS — C8119 Nodular sclerosis classical Hodgkin lymphoma, extranodal and solid organ sites: Secondary | ICD-10-CM | POA: Diagnosis present

## 2012-07-24 DIAGNOSIS — C8190 Hodgkin lymphoma, unspecified, unspecified site: Secondary | ICD-10-CM

## 2012-07-24 DIAGNOSIS — D72819 Decreased white blood cell count, unspecified: Secondary | ICD-10-CM

## 2012-07-24 DIAGNOSIS — I152 Hypertension secondary to endocrine disorders: Secondary | ICD-10-CM | POA: Diagnosis present

## 2012-07-24 DIAGNOSIS — J189 Pneumonia, unspecified organism: Secondary | ICD-10-CM

## 2012-07-24 DIAGNOSIS — L309 Dermatitis, unspecified: Secondary | ICD-10-CM | POA: Diagnosis present

## 2012-07-24 DIAGNOSIS — E119 Type 2 diabetes mellitus without complications: Secondary | ICD-10-CM

## 2012-07-24 DIAGNOSIS — L259 Unspecified contact dermatitis, unspecified cause: Secondary | ICD-10-CM | POA: Diagnosis present

## 2012-07-24 DIAGNOSIS — R05 Cough: Secondary | ICD-10-CM

## 2012-07-24 DIAGNOSIS — I10 Essential (primary) hypertension: Secondary | ICD-10-CM

## 2012-07-24 DIAGNOSIS — R918 Other nonspecific abnormal finding of lung field: Secondary | ICD-10-CM

## 2012-07-24 DIAGNOSIS — E1159 Type 2 diabetes mellitus with other circulatory complications: Secondary | ICD-10-CM | POA: Diagnosis present

## 2012-07-24 DIAGNOSIS — Z8571 Personal history of Hodgkin lymphoma: Secondary | ICD-10-CM | POA: Diagnosis present

## 2012-07-24 DIAGNOSIS — M62830 Muscle spasm of back: Secondary | ICD-10-CM | POA: Diagnosis present

## 2012-07-24 DIAGNOSIS — R509 Fever, unspecified: Secondary | ICD-10-CM

## 2012-07-24 HISTORY — DX: Decreased white blood cell count, unspecified: D72.819

## 2012-07-24 HISTORY — DX: Cough: R05

## 2012-07-24 LAB — CBC WITH DIFFERENTIAL/PLATELET
Basophils Absolute: 0 10*3/uL (ref 0.0–0.1)
Eosinophils Relative: 7 % — ABNORMAL HIGH (ref 0–5)
Lymphocytes Relative: 14 % (ref 12–46)
MCV: 85.5 fL (ref 78.0–100.0)
Neutro Abs: 2.1 10*3/uL (ref 1.7–7.7)
Neutrophils Relative %: 58 % (ref 43–77)
Platelets: 179 10*3/uL (ref 150–400)
RDW: 12.4 % (ref 11.5–15.5)
WBC: 3.6 10*3/uL — ABNORMAL LOW (ref 4.0–10.5)

## 2012-07-24 LAB — COMPREHENSIVE METABOLIC PANEL
Alkaline Phosphatase: 74 U/L (ref 39–117)
BUN: 9 mg/dL (ref 6–23)
CO2: 20 mEq/L (ref 19–32)
Chloride: 95 mEq/L — ABNORMAL LOW (ref 96–112)
Creatinine, Ser: 0.76 mg/dL (ref 0.50–1.10)
GFR calc Af Amer: >90 mL/min (ref >90)
GFR calc non Af Amer: >90 mL/min (ref >90)
Glucose, Bld: 76 mg/dL (ref 70–99)
Potassium: 3.7 mEq/L (ref 3.5–5.1)
Total Bilirubin: 0.4 mg/dL (ref 0.3–1.2)

## 2012-07-24 LAB — URINALYSIS, ROUTINE W REFLEX MICROSCOPIC
Hgb urine dipstick: NEGATIVE
Ketones, ur: >80 mg/dL — AB
Specific Gravity, Urine: 1.037 — ABNORMAL HIGH (ref 1.005–1.030)
pH: 6 (ref 5.0–8.0)

## 2012-07-24 MED ORDER — SODIUM CHLORIDE 0.9 % IV BOLUS (SEPSIS)
1000.0000 mL | Freq: Once | INTRAVENOUS | Status: AC
  Administered 2012-07-25: 1000 mL via INTRAVENOUS

## 2012-07-24 MED ORDER — ACETAMINOPHEN 325 MG PO TABS
650.0000 mg | ORAL_TABLET | Freq: Once | ORAL | Status: AC
  Administered 2012-07-24: 650 mg via ORAL
  Filled 2012-07-24: qty 2

## 2012-07-24 NOTE — ED Provider Notes (Signed)
History     CSN: [id number]  Arrival date & time 07/24/12  2026   First MD Initiated Contact with Patient 07/24/12 2307      Chief Complaint  Patient presents with  . Fever    (Consider location/radiation/quality/duration/timing/severity/associated sxs/prior treatment) HPI Comments: Patient states she has finished her 20th round of radiation therapy for Hodgkin's lymphoma.  She has chronic, ongoing, dry mouth syndrome.  Also has a burning sensation at the radiation site.  Area.  That has been persistent and not responsive to steroid cream.  She presents today with low back pain, thinking.  She may have a urinary tract infection.  She, states she is drinking copious amounts of water, and thinks.  Her urine, to be clear, but it has a yellow tinge to it.  She denies any dysuria, hematuria.  She, states she is up with Dr. Kennedy Bucker and to not about her persistent "burning" sensation to the radiation site, and he keeps telling her just to apply the topical ointment, which is not working  Patient is a 31 y.o. female presenting with fever. The history is provided by the patient.  Fever Primary symptoms of the febrile illness include fever. Primary symptoms do not include abdominal pain, nausea, vomiting, dysuria, myalgias, arthralgias or rash.    Past Medical History  Diagnosis Date  . Hypertension   . Diabetes mellitus   . Complication of anesthesia SLOW TO WAKE UP AFTER C SECTION  . Hodgkin lymphoma 01/18/2012  . Benign essential HTN 01/19/2012  . DM II (diabetes mellitus, type II), controlled 01/19/2012  . Eczema 01/19/2012  . Muscle spasm of back 03/09/2012  . hodgkins lymphoma dx'd 01/16/12    lt axilla and left neck   . S/P chemotherapy, time since 4-12 weeks 05/11/2012    4 cycles of ABVD with neulasta suppor and zoladex  . Asthma     seasonal    Past Surgical History  Procedure Date  . Dilation and curettage of uterus   . Cesarean section   . Axillary lymph node biopsy 12/2011   left  . Portacath placement 01/25/2012    Procedure: INSERTION PORT-A-CATH;  Surgeon: Atilano Ina, MD,FACS;  Location: [facility];  Service: General;  Laterality: Right;  right subclavian    Family History  Problem Relation Age of Onset  . Diabetes Maternal Grandmother   . Heart disease Maternal Grandfather   . Cancer Neg Hx     History  Substance Use Topics  . Smoking status: Former Smoker    Quit date: 11/19/2006  . Smokeless tobacco: Never Used  . Alcohol Use: No    OB History    Grav Para Term Preterm Abortions TAB SAB Ect Mult Living   3 1 1  2  0 2 0 0 1      Review of Systems  Constitutional: Positive for fever. Negative for chills.  HENT: Negative for rhinorrhea.   Gastrointestinal: Negative for nausea, vomiting and abdominal pain.  Genitourinary: Positive for decreased urine volume. Negative for dysuria, flank pain and difficulty urinating.  Musculoskeletal: Positive for back pain. Negative for myalgias and arthralgias.  Skin: Negative for rash and wound.  Neurological: Negative for dizziness, weakness and numbness.    Allergies  Penicillins  Home Medications   Current Outpatient Rx  Name Route Sig Dispense Refill  . EMOLLIENT BASE EX CREA Topical Apply 1 application topically 2 (two) times daily as needed. For rash.    . ERGOCALCIFEROL 50000 UNITS PO CAPS Oral Take  50,000 Units by mouth every 7 (seven) days. Sunday    . HYDROCODONE-ACETAMINOPHEN 7.5-500 MG/15ML PO SOLN Oral Take 15 mLs by mouth every 6 (six) hours as needed. For pain.    Marland Kitchen HYDROCORTISONE 1 % EX CREA Topical Apply 1 application topically 2 (two) times daily as needed. For eczema    . LISINOPRIL-HYDROCHLOROTHIAZIDE 20-12.5 MG PO TABS Oral Take 1 tablet by mouth daily.    Marland Kitchen LORAZEPAM 2 MG PO TABS Oral Take 1 tablet (2 mg total) by mouth every 6 (six) hours as needed for anxiety (nausea or sleep). For anxiety 30 tablet 3    This script was given to pt on 01/18/12.  . METFORMIN HCL 500 MG PO TABS  Oral Take 500 mg by mouth 2 (two) times daily with a meal.      . ONDANSETRON 8 MG PO TBDP Oral Take 8 mg by mouth every 8 (eight) hours as needed. For nausea.    . SUCRALFATE 1 GM/10ML PO SUSP Oral Take 10 mLs (1 g total) by mouth 4 (four) times daily. 420 mL 1  . ZOLPIDEM TARTRATE 5 MG PO TABS Oral Take 5-10 mg by mouth at bedtime as needed. For sleep.      BP 112/64  Pulse 96  Temp 98.5 F (36.9 C) (Oral)  Resp 16  Ht 5\' 7"  (1.702 m)  Wt 190 lb (86.183 kg)  BMI 29.76 kg/m2  SpO2 98%  Physical Exam  Constitutional: She is oriented to person, place, and time. She appears well-developed and well-nourished.  HENT:  Head: Normocephalic.  Mouth/Throat: Uvula is midline.  Eyes: Pupils are equal, round, and reactive to light.  Neck: Normal range of motion.  Cardiovascular: Tachycardia present.   Pulmonary/Chest: Effort normal.  Abdominal: Soft.  Musculoskeletal: Normal range of motion.       Arms: Neurological: She is alert and oriented to person, place, and time.  Skin: Skin is warm. No erythema.    ED Course  Procedures (including critical care time)  Labs Reviewed  URINALYSIS, ROUTINE W REFLEX MICROSCOPIC - Abnormal; Notable for the following:    APPearance CLOUDY (*)     Specific Gravity, Urine 1.037 (*)     Bilirubin Urine SMALL (*)     Ketones, ur >80 (*)     Protein, ur 30 (*)     All other components within normal limits  COMPREHENSIVE METABOLIC PANEL - Abnormal; Notable for the following:    Sodium 132 (*)     Chloride 95 (*)     All other components within normal limits  CBC WITH DIFFERENTIAL - Abnormal; Notable for the following:    WBC 3.6 (*)     Lymphs Abs 0.5 (*)     Monocytes Relative 20 (*)     Eosinophils Relative 7 (*)     All other components within normal limits  URINE MICROSCOPIC-ADD ON - Abnormal; Notable for the following:    Squamous Epithelial / LPF MANY (*)     Bacteria, UA FEW (*)     All other components within normal limits  POCT  PREGNANCY, URINE  URINE CULTURE  CULTURE, BLOOD (ROUTINE X 2)  CULTURE, BLOOD (ROUTINE X 2)  CULTURE, BLOOD (SINGLE)  CBC  CREATININE, SERUM  BASIC METABOLIC PANEL  CBC   Dg Chest 2 View  07/24/2012  *RADIOLOGY REPORT*  Clinical Data: Fever, cough.  CHEST - 2 VIEW  Comparison: 05/28/2012 PET CT  Findings: Right chest wall Port-A-Cath tip projects over the proximal  SVC.  Mild peripheral reticular nodular opacities.  No pleural effusion or pneumothorax.  No acute osseous finding. Cardiomediastinal contours within normal range.  IMPRESSION: Mild peripheral reticular nodular opacities may correspond to the nodules described on recent PET CT. The differential includes infection or metastatic disease.   Original Report Authenticated By: Waneta Martins, M.D.      1. Hodgkin's disease, unspecified       MDM  Will assess for dehydration, urine sample, blood cultures chest xray          Arman Filter, NP 07/25/12 [phone]

## 2012-07-24 NOTE — ED Notes (Signed)
Patient transported to X-ray 

## 2012-07-24 NOTE — ED Notes (Signed)
Pt states she has a fever and thinks maybe she is dehydrated or may have a UTI  Pt states she is drinking lots of water but continues to have dry mouth and decreased urine output  Pt is c/o pain in her back  Pt states she has Hodgekins lymphoma and is undergoing radiation treatments  Pt states her last tx was last Monday

## 2012-07-24 NOTE — ED Notes (Signed)
In addition to fever, pt is also concerned about "burning" sensation under and around left arm due to radiation.  States she currently has a cream given to her by her radiation nurse but it does not seem to be helping.

## 2012-07-25 ENCOUNTER — Inpatient Hospital Stay (HOSPITAL_COMMUNITY): Payer: Medicaid Other

## 2012-07-25 ENCOUNTER — Encounter (HOSPITAL_COMMUNITY): Payer: Self-pay

## 2012-07-25 ENCOUNTER — Observation Stay (HOSPITAL_COMMUNITY): Payer: Medicaid Other

## 2012-07-25 DIAGNOSIS — C8190 Hodgkin lymphoma, unspecified, unspecified site: Secondary | ICD-10-CM

## 2012-07-25 DIAGNOSIS — J189 Pneumonia, unspecified organism: Secondary | ICD-10-CM

## 2012-07-25 DIAGNOSIS — R509 Fever, unspecified: Secondary | ICD-10-CM | POA: Diagnosis present

## 2012-07-25 LAB — GLUCOSE, CAPILLARY

## 2012-07-25 LAB — CBC
MCHC: 35.2 g/dL (ref 30.0–36.0)
Platelets: 165 10*3/uL (ref 150–400)
RDW: 12.2 % (ref 11.5–15.5)

## 2012-07-25 LAB — CREATININE, SERUM
Creatinine, Ser: 0.68 mg/dL (ref 0.50–1.10)
GFR calc non Af Amer: >90 mL/min (ref >90)

## 2012-07-25 MED ORDER — LEVOFLOXACIN IN D5W 750 MG/150ML IV SOLN
750.0000 mg | Freq: Every day | INTRAVENOUS | Status: DC
  Administered 2012-07-25 – 2012-07-30 (×6): 750 mg via INTRAVENOUS
  Filled 2012-07-25 (×6): qty 150

## 2012-07-25 MED ORDER — FOLIC ACID 1 MG PO TABS
ORAL_TABLET | ORAL | Status: AC
  Filled 2012-07-25: qty 1

## 2012-07-25 MED ORDER — ONDANSETRON HCL 4 MG/2ML IJ SOLN
4.0000 mg | Freq: Four times a day (QID) | INTRAMUSCULAR | Status: DC | PRN
  Administered 2012-07-26 – 2012-07-29 (×4): 4 mg via INTRAVENOUS
  Filled 2012-07-25 (×5): qty 2

## 2012-07-25 MED ORDER — POTASSIUM CHLORIDE IN NACL 20-0.9 MEQ/L-% IV SOLN
INTRAVENOUS | Status: AC
  Administered 2012-07-25 – 2012-07-26 (×2): via INTRAVENOUS
  Filled 2012-07-25 (×3): qty 1000

## 2012-07-25 MED ORDER — HYDROCODONE-ACETAMINOPHEN 7.5-500 MG/15ML PO SOLN
15.0000 mL | Freq: Four times a day (QID) | ORAL | Status: DC | PRN
  Administered 2012-07-25 – 2012-07-28 (×3): 15 mL via ORAL
  Filled 2012-07-25 (×3): qty 15

## 2012-07-25 MED ORDER — HYDROCHLOROTHIAZIDE 12.5 MG PO CAPS
12.5000 mg | ORAL_CAPSULE | Freq: Every day | ORAL | Status: DC
  Administered 2012-07-25 – 2012-07-26 (×2): 12.5 mg via ORAL
  Filled 2012-07-25 (×3): qty 1

## 2012-07-25 MED ORDER — HYDROCORTISONE 1 % EX CREA
1.0000 "application " | TOPICAL_CREAM | Freq: Two times a day (BID) | CUTANEOUS | Status: DC | PRN
  Filled 2012-07-25: qty 28

## 2012-07-25 MED ORDER — MORPHINE SULFATE 2 MG/ML IJ SOLN
2.0000 mg | INTRAMUSCULAR | Status: DC | PRN
  Administered 2012-07-25 – 2012-07-30 (×3): 2 mg via INTRAVENOUS
  Filled 2012-07-25 (×3): qty 1

## 2012-07-25 MED ORDER — LISINOPRIL-HYDROCHLOROTHIAZIDE 20-12.5 MG PO TABS
1.0000 | ORAL_TABLET | Freq: Every day | ORAL | Status: DC
Start: 2012-07-25 — End: 2012-07-25

## 2012-07-25 MED ORDER — DEXTROSE 5 % IV SOLN
1.0000 g | Freq: Once | INTRAVENOUS | Status: AC
  Administered 2012-07-25: 07:00:00 via INTRAVENOUS
  Filled 2012-07-25: qty 10

## 2012-07-25 MED ORDER — DEXTROSE 5 % IV SOLN: 1.0000 g | INTRAVENOUS | Status: DC

## 2012-07-25 MED ORDER — SODIUM CHLORIDE 0.9 % IV SOLN
1.0000 g | Freq: Three times a day (TID) | INTRAVENOUS | Status: DC
  Administered 2012-07-25 – 2012-07-27 (×7): 1 g via INTRAVENOUS
  Filled 2012-07-25 (×8): qty 1

## 2012-07-25 MED ORDER — LISINOPRIL 20 MG PO TABS
20.0000 mg | ORAL_TABLET | Freq: Every day | ORAL | Status: DC
  Administered 2012-07-25 – 2012-07-26 (×2): 20 mg via ORAL
  Filled 2012-07-25 (×3): qty 1

## 2012-07-25 MED ORDER — LORAZEPAM 1 MG PO TABS
2.0000 mg | ORAL_TABLET | Freq: Four times a day (QID) | ORAL | Status: DC | PRN
  Administered 2012-07-25: 2 mg via ORAL
  Filled 2012-07-25: qty 2

## 2012-07-25 MED ORDER — ZOLPIDEM TARTRATE 5 MG PO TABS
5.0000 mg | ORAL_TABLET | Freq: Every evening | ORAL | Status: DC | PRN
  Filled 2012-07-25: qty 1

## 2012-07-25 MED ORDER — VANCOMYCIN HCL IN DEXTROSE 1-5 GM/200ML-% IV SOLN
1000.0000 mg | Freq: Three times a day (TID) | INTRAVENOUS | Status: DC
  Administered 2012-07-25 – 2012-07-27 (×7): 1000 mg via INTRAVENOUS
  Filled 2012-07-25 (×9): qty 200

## 2012-07-25 MED ORDER — ONDANSETRON HCL 4 MG PO TABS: 4.0000 mg | ORAL_TABLET | Freq: Four times a day (QID) | ORAL | Status: DC | PRN

## 2012-07-25 MED ORDER — BACLOFEN 5 MG HALF TABLET
5.0000 mg | ORAL_TABLET | Freq: Three times a day (TID) | ORAL | Status: DC | PRN
  Filled 2012-07-25: qty 1

## 2012-07-25 MED ORDER — ADULT MULTIVITAMIN W/MINERALS CH
ORAL_TABLET | ORAL | Status: AC
  Filled 2012-07-25: qty 1

## 2012-07-25 MED ORDER — POTASSIUM CHLORIDE IN NACL 20-0.9 MEQ/L-% IV SOLN
INTRAVENOUS | Status: DC
  Administered 2012-07-25: 07:00:00 via INTRAVENOUS
  Filled 2012-07-25 (×2): qty 1000

## 2012-07-25 MED ORDER — ALUM & MAG HYDROXIDE-SIMETH 200-200-20 MG/5ML PO SUSP
30.0000 mL | Freq: Four times a day (QID) | ORAL | Status: DC | PRN
  Administered 2012-07-31: 30 mL via ORAL
  Filled 2012-07-25: qty 30

## 2012-07-25 MED ORDER — AZITHROMYCIN 250 MG PO TABS
500.0000 mg | ORAL_TABLET | Freq: Once | ORAL | Status: DC
  Filled 2012-07-25: qty 2

## 2012-07-25 MED ORDER — INSULIN ASPART 100 UNIT/ML ~~LOC~~ SOLN
0.0000 [IU] | Freq: Three times a day (TID) | SUBCUTANEOUS | Status: DC
  Administered 2012-07-25: 3 [IU] via SUBCUTANEOUS
  Administered 2012-07-26: 2 [IU] via SUBCUTANEOUS
  Administered 2012-07-28: 3 [IU] via SUBCUTANEOUS
  Administered 2012-07-28 – 2012-07-30 (×2): 2 [IU] via SUBCUTANEOUS

## 2012-07-25 MED ORDER — SUCRALFATE 1 GM/10ML PO SUSP
1.0000 g | Freq: Three times a day (TID) | ORAL | Status: DC
Start: 2012-07-25 — End: 2012-08-02
  Administered 2012-07-25 – 2012-07-30 (×16): 1 g via ORAL
  Filled 2012-07-25 (×37): qty 10

## 2012-07-25 MED ORDER — INSULIN ASPART 100 UNIT/ML ~~LOC~~ SOLN: 0.0000 [IU] | Freq: Every day | SUBCUTANEOUS | Status: DC

## 2012-07-25 MED ORDER — METFORMIN HCL 500 MG PO TABS
500.0000 mg | ORAL_TABLET | Freq: Two times a day (BID) | ORAL | Status: DC
  Filled 2012-07-25 (×3): qty 1

## 2012-07-25 MED ORDER — LIDOCAINE-PRILOCAINE 2.5-2.5 % EX CREA
TOPICAL_CREAM | Freq: Once | CUTANEOUS | Status: AC
  Administered 2012-07-25: 1 via TOPICAL
  Filled 2012-07-25: qty 5

## 2012-07-25 MED ORDER — GADOBENATE DIMEGLUMINE 529 MG/ML IV SOLN
20.0000 mL | Freq: Once | INTRAVENOUS | Status: AC | PRN
  Administered 2012-07-25: 18 mL via INTRAVENOUS

## 2012-07-25 MED ORDER — ENOXAPARIN SODIUM 40 MG/0.4ML ~~LOC~~ SOLN
40.0000 mg | SUBCUTANEOUS | Status: DC
  Administered 2012-07-25 – 2012-07-31 (×7): 40 mg via SUBCUTANEOUS
  Filled 2012-07-25 (×9): qty 0.4

## 2012-07-25 MED ORDER — LIDOCAINE 5 % EX PTCH
1.0000 | MEDICATED_PATCH | CUTANEOUS | Status: DC
  Administered 2012-07-25 – 2012-07-31 (×7): 1 via TRANSDERMAL
  Filled 2012-07-25 (×10): qty 1

## 2012-07-25 MED ORDER — DEXTROSE 5 % IV SOLN
500.0000 mg | INTRAVENOUS | Status: DC
  Filled 2012-07-25: qty 500

## 2012-07-25 MED ORDER — VITAMIN B-1 100 MG PO TABS
ORAL_TABLET | ORAL | Status: AC
  Filled 2012-07-25: qty 1

## 2012-07-25 NOTE — ED Notes (Signed)
Patient transported to MRI 

## 2012-07-25 NOTE — Progress Notes (Signed)
[facility]                                                                                Patient Demographics  [name], is a 31 y.o. female  WUJ:[id number]  NFA:[id number]  DOB - Feb 02, 1981  Admit date - 07/24/2012  Admitting Physician Gery Pray, MD  Outpatient Primary MD for the patient is Dorrene German, MD  LOS - 1   Chief Complaint  Patient presents with  . Fever        Assessment & Plan   1. Fevers with possible pneumonia in an immunocompromised patient who has history of Hodgkin's lymphoma and currently undergoing chemotherapy- blood cultures have been obtained, patient paced on appropriate IV antibiotics, repeat 2 view chest x-ray in the morning, if x-ray stable will rapidly taper down antibiotics, IV fluids, when necessary nebulizer and oxygen treatment as needed, oncology requested to see the patient for her Hodgkin's disease and ongoing chemotherapy.    2. History of diabetes mellitus type 2- Glucophage will be held while she is in the hospital, sliding scale insulin a.c. at bedtime.   CBG (last 3)   Basename 07/25/12 0813  GLUCAP 72     3. History of hypertension, back spasms and eczema - no acute issues home medication lisinopril will be continued and blood pressure will be closely monitored, supportive care with baclofen as needed for back spasms and steroid cream for left axillary eczema post radiation.     Code Status: Full  Family Communication: D/W patient and Family 07-25-12  Disposition Plan: Home    Procedures CXR   Consults Oncology   Antibiotics  Vanco,Mero,Levaquin 07-25-12  Anti-infectives     Start     Dose/Rate Route Frequency Ordered Stop   07/25/12 0900   levofloxacin (LEVAQUIN) IVPB 750 mg        750 mg 100 mL/hr over 90 Minutes Intravenous Daily 07/25/12 0802     07/25/12 0900   meropenem (MERREM) 1 g in sodium chloride 0.9 % 100 mL IVPB        1 g 200 mL/hr over 30 Minutes Intravenous  Every 8 hours 07/25/12 0802     07/25/12 0600   vancomycin (VANCOCIN) IVPB 1000 mg/200 mL premix        1,000 mg 200 mL/hr over 60 Minutes Intravenous Every 8 hours 07/25/12 0534     07/25/12 0515   cefTRIAXone (ROCEPHIN) 1 g in dextrose 5 % 50 mL IVPB  Status:  Discontinued        1 g 100 mL/hr over 30 Minutes Intravenous Every 24 hours 07/25/12 0503 07/25/12 0713   07/25/12 0515   azithromycin (ZITHROMAX) 500 mg in dextrose 5 % 250 mL IVPB  Status:  Discontinued        500 mg 250 mL/hr over 60 Minutes Intravenous Every 24 hours 07/25/12 0503 07/25/12 0810   07/25/12 0400   cefTRIAXone (ROCEPHIN) 1 g in dextrose 5 % 50 mL IVPB        1 g 100 mL/hr over 30 Minutes Intravenous  Once 07/25/12 0347 07/25/12 0709   07/25/12 0400   azithromycin (ZITHROMAX) tablet 500 mg  Status:  Discontinued  500 mg Oral  Once 07/25/12 0347 07/25/12 0713          Time Spent in minutes   35   DVT Prophylaxis  Lovenox    Lab Results  Component Value Date   PLT 165 07/25/2012     Scheduled Meds:   . acetaminophen  650 mg Oral Once  . cefTRIAXone (ROCEPHIN)  IV  1 g Intravenous Once  . enoxaparin (LOVENOX) injection  40 mg Subcutaneous Q24H  . lisinopril  20 mg Oral Daily   And  . hydrochlorothiazide  12.5 mg Oral Daily  . insulin aspart  0-15 Units Subcutaneous TID WC  . insulin aspart  0-5 Units Subcutaneous QHS  . levofloxacin (LEVAQUIN) IV  750 mg Intravenous Daily  . lidocaine  1 patch Transdermal Q24H  . lidocaine-prilocaine   Topical Once  . meropenem (MERREM) IV  1 g Intravenous Q8H  . sodium chloride  1,000 mL Intravenous Once  . sucralfate  1 g Oral TID AC & HS  . vancomycin  1,000 mg Intravenous Q8H  . DISCONTD: azithromycin  500 mg Intravenous Q24H  . DISCONTD: azithromycin  500 mg Oral Once  . DISCONTD: cefTRIAXone (ROCEPHIN)  IV  1 g Intravenous Q24H  . DISCONTD: lisinopril-hydrochlorothiazide  1 tablet Oral Daily  . DISCONTD: metFORMIN  500 mg Oral BID WC    Continuous Infusions:   . 0.9 % NaCl with KCl 20 mEq / L 100 mL/hr at 07/25/12 0928  . DISCONTD: 0.9 % NaCl with KCl 20 mEq / L 100 mL/hr at 07/25/12 0639   PRN Meds:.alum & mag hydroxide-simeth, gadobenate dimeglumine, HYDROcodone-acetaminophen, hydrocortisone cream, LORazepam, morphine injection, ondansetron (ZOFRAN) IV, ondansetron, zolpidem  Susa Raring K M.D on 07/25/2012 at 10:09 AM  Between 7am to 7pm - Pager - [phone]  After 7pm go to [email or web address] - password [email or web address]  And look for the night coverage person covering for me after hours  [facility] Office  [phone]    Subjective:   [name] today has, No headache, No chest pain, No abdominal pain - No Nausea, No new weakness tingling or numbness, No Cough - SOB.    Objective:   Filed Vitals:   07/25/12 0113 07/25/12 0503 07/25/12 0803 07/25/12 0830  BP: 115/61 112/64 113/67 125/78  Pulse: 96  88 93  Temp: 98.3 F (36.8 C) 98.5 F (36.9 C) 99 F (37.2 C) 99.1 F (37.3 C)  TempSrc: Oral Oral Oral Oral  Resp: 13 16 16 18   Height:    5\' 7"  (1.702 m)  Weight:    90.6 kg (199 lb 11.8 oz)  SpO2: 97% 98% 97% 100%    Wt Readings from Last 3 Encounters:  07/25/12 90.6 kg (199 lb 11.8 oz)  07/16/12 90.538 kg (199 lb 9.6 oz)  07/04/12 95.89 kg (211 lb 6.4 oz)    No intake or output data in the 24 hours ending 07/25/12 1009  Exam Awake Alert, Oriented X 3, No new F.N deficits, Normal affect [place].AT,PERRAL Supple Neck,No JVD, No cervical lymphadenopathy appriciated.  Symmetrical Chest wall movement, Good air movement bilaterally, CTAB RRR,No Gallops,Rubs or new Murmurs, No Parasternal Heave +ve B.Sounds, Abd Soft, Non tender, No organomegaly appriciated, No rebound - guarding or rigidity. No Cyanosis, Clubbing or edema, No new Rash or bruise , L axilla skin darkened post radiation   Data Review    Radiology Reports Dg Chest 2 View  07/24/2012  *RADIOLOGY REPORT*  Clinical Data:  Fever, cough.  CHEST -  2 VIEW  Comparison: 05/28/2012 PET CT  Findings: Right chest wall Port-A-Cath tip projects over the proximal SVC.  Mild peripheral reticular nodular opacities.  No pleural effusion or pneumothorax.  No acute osseous finding. Cardiomediastinal contours within normal range.  IMPRESSION: Mild peripheral reticular nodular opacities may correspond to the nodules described on recent PET CT. The differential includes infection or metastatic disease.   Original Report Authenticated By: Waneta Martins, M.D.    Mr Lumbar Spine W Wo Contrast  07/25/2012  *RADIOLOGY REPORT*  Clinical Data: Low back pain, fever, and history of Hodgkin's lymphoma.  MRI LUMBAR SPINE WITHOUT AND WITH CONTRAST  Technique:  Multiplanar and multiecho pulse sequences of the lumbar spine were obtained without and with intravenous contrast.  Contrast: 18mL MULTIHANCE GADOBENATE DIMEGLUMINE 529 MG/ML IV SOLN  Comparison: None.  Findings: Normal conus tip is at L2.  Normal paraspinal soft tissues.  The discs from T11-12 through L5-S1 are normal.  There is no facet arthritis, spinal stenosis, spondylolisthesis, or other abnormality.  There is no pathologic enhancement after contrast administration.  IMPRESSION: Normal MRI of the lumbar spine.   Original Report Authenticated By: Gwynn Burly, M.D.     CBC  Lab 07/25/12 0642 07/24/12 2310  WBC 2.8* 3.6*  HGB 11.5* 12.9  HCT 32.7* 36.1  PLT 165 179  MCV 85.4 85.5  MCH 30.0 30.6  MCHC 35.2 35.7  RDW 12.2 12.4  LYMPHSABS -- 0.5*  MONOABS -- 0.7  EOSABS -- 0.3  BASOSABS -- 0.0  BANDABS -- --    Chemistries   Lab 07/25/12 0642 07/24/12 2310  NA -- 132*  K -- 3.7  CL -- 95*  CO2 -- 20  GLUCOSE -- 76  BUN -- 9  CREATININE 0.68 0.76  CALCIUM -- 9.3  MG -- --  AST -- 17  ALT -- 21  ALKPHOS -- 74  BILITOT -- 0.4   ------------------------------------------------------------------------------------------------------------------ estimated creatinine  clearance is 121 ml/min (by C-G formula based on Cr of 0.68). ------------------------------------------------------------------------------------------------------------------ No results found for this basename: HGBA1C:2 in the last 72 hours ------------------------------------------------------------------------------------------------------------------ No results found for this basename: CHOL:2,HDL:2,LDLCALC:2,TRIG:2,CHOLHDL:2,LDLDIRECT:2 in the last 72 hours ------------------------------------------------------------------------------------------------------------------ No results found for this basename: TSH,T4TOTAL,FREET3,T3FREE,THYROIDAB in the last 72 hours ------------------------------------------------------------------------------------------------------------------ No results found for this basename: VITAMINB12:2,FOLATE:2,FERRITIN:2,TIBC:2,IRON:2,RETICCTPCT:2 in the last 72 hours  Coagulation profile No results found for this basename: INR:5,PROTIME:5 in the last 168 hours  No results found for this basename: DDIMER:2 in the last 72 hours  Cardiac Enzymes No results found for this basename: CK:3,CKMB:3,TROPONINI:3,MYOGLOBIN:3 in the last 168 hours ------------------------------------------------------------------------------------------------------------------ No components found with this basename: POCBNP:3

## 2012-07-25 NOTE — ED Notes (Addendum)
Pt still in MRI. Pt will be brought back to ED and then transported up to floor.   hospitalist came by room, reports to finish giving rocephin. Check with md about administration of other anitbiotics

## 2012-07-25 NOTE — Progress Notes (Addendum)
ANTIBIOTIC CONSULT NOTE - INITIAL  Pharmacy Consult for Vancomycin Indication: Suspected Pneumonia  Allergies  Allergen Reactions  . Penicillins Rash    Patient Measurements: Height: 5\' 7"  (170.2 cm) Weight: 190 lb (86.183 kg) IBW/kg (Calculated) : 61.6    Vital Signs: Temp: 98.5 F (36.9 C) (08/28 0503) Temp src: Oral (08/28 0503) BP: 112/64 mmHg (08/28 0503) Pulse Rate: 96  (08/28 0113) Intake/Output from previous day:   Intake/Output from this shift:    Labs:  Basename 07/24/12 2310  WBC 3.6*  HGB 12.9  PLT 179  LABCREA --  CREATININE 0.76   Estimated Creatinine Clearance: 118 ml/min (by C-G formula based on Cr of 0.76). No results found for this basename: VANCOTROUGH:2,VANCOPEAK:2,VANCORANDOM:2,GENTTROUGH:2,GENTPEAK:2,GENTRANDOM:2,TOBRATROUGH:2,TOBRAPEAK:2,TOBRARND:2,AMIKACINPEAK:2,AMIKACINTROU:2,AMIKACIN:2, in the last 72 hours   Microbiology: No results found for this or any previous visit (from the past 720 hour(s)).  Medical History: Past Medical History  Diagnosis Date  . Hypertension   . Diabetes mellitus   . Complication of anesthesia SLOW TO WAKE UP AFTER C SECTION  . Hodgkin lymphoma 01/18/2012  . Benign essential HTN 01/19/2012  . DM II (diabetes mellitus, type II), controlled 01/19/2012  . Eczema 01/19/2012  . Muscle spasm of back 03/09/2012  . hodgkins lymphoma dx'd 01/16/12    lt axilla and left neck   . S/P chemotherapy, time since 4-12 weeks 05/11/2012    4 cycles of ABVD with neulasta suppor and zoladex  . Asthma     seasonal    Medications:  Scheduled:    . acetaminophen  650 mg Oral Once  . azithromycin  500 mg Intravenous Q24H  . azithromycin  500 mg Oral Once  . cefTRIAXone (ROCEPHIN)  IV  1 g Intravenous Once  . cefTRIAXone (ROCEPHIN)  IV  1 g Intravenous Q24H  . enoxaparin (LOVENOX) injection  40 mg Subcutaneous Q24H  . lisinopril  20 mg Oral Daily   And  . hydrochlorothiazide  12.5 mg Oral Daily  . insulin aspart  0-15 Units  Subcutaneous TID WC  . insulin aspart  0-5 Units Subcutaneous QHS  . lidocaine  1 patch Transdermal Q24H  . lidocaine-prilocaine   Topical Once  . metFORMIN  500 mg Oral BID WC  . sodium chloride  1,000 mL Intravenous Once  . sucralfate  1 g Oral TID AC & HS  . DISCONTD: lisinopril-hydrochlorothiazide  1 tablet Oral Daily   Infusions:    . 0.9 % NaCl with KCl 20 mEq / L     Assessment:  31 year old female presents with fever.  Recent diagnosis of Hodgkin's Lymphoma and s/p completion of chemo in June and completion of XRT.  Chest Xray notes opacities = infection vs metastatic disease  Blood cultures x 2 ordered  IV Rocephin, Azithromycin and Vancomycin to begin for suspected pneumonia  Goal of Therapy:  Vancomycin trough level 15-20 mcg/ml  Plan:  Measure antibiotic drug levels at steady state Follow up culture results Vancomycin 1000mg  IV q8h  Poindexter, Joselyn Glassman, PharmD 07/25/2012,5:29 AM    Addendum:  MD would like to add Levaquin and Meropenem to Vancomycin for pneumonia coverage.   Plan:    Levaquin 750 mg IV q24h, Meropenem   Meropenem 1gm IV q8h.   Will contact MD to d/c Azithromycin 500 mg IV q24h  Pharmacy will f/u  Geoffry Paradise, PharmD, BCPS Pager: [phone] 7:59 AM Pharmacy #: [phone]

## 2012-07-25 NOTE — ED Provider Notes (Signed)
Medical screening examination/treatment/procedure(s) were conducted as a shared visit with non-physician practitioner(s) and myself.  I personally evaluated the patient during the encounter  Given the patient's fever and ongoing cancer she was admitted for observation.  Possible pneumonia.  abx given  Dg Chest 2 View  07/24/2012  *RADIOLOGY REPORT*  Clinical Data: Fever, cough.  CHEST - 2 VIEW  Comparison: 05/28/2012 PET CT  Findings: Right chest wall Port-A-Cath tip projects over the proximal SVC.  Mild peripheral reticular nodular opacities.  No pleural effusion or pneumothorax.  No acute osseous finding. Cardiomediastinal contours within normal range.  IMPRESSION: Mild peripheral reticular nodular opacities may correspond to the nodules described on recent PET CT. The differential includes infection or metastatic disease.   Original Report Authenticated By: Waneta Martins, M.D.    I personally reviewed the imaging tests through PACS system  I reviewed available ER/hospitalization records thought the EMR   Lyanne Co, MD 07/25/12 [phone]

## 2012-07-25 NOTE — ED Notes (Signed)
rn verified on [facility] that nacl with kcl is compatible with rocephin.

## 2012-07-25 NOTE — H&P (Addendum)
PCP:   Dorrene German, MD  Oncologist: Dr. Cyndie Chime  Chief Complaint:  Fevers  HPI: [name] with a recent diagnosis of Hodgkin's lymphoma in January 2013. She completed her course of her chemotherapy in June and just completed her course of radiation therapy. She is a Mediport in the right chest wall. She presents to the ER because she's had severe dry mouth. She states it's been present from the chemotherapy but it is  worse since last week. She's also had scant urine output despite significant by mouth intake. She reports no burning or urination. Today at home her temperature was up to 101, in the ER it was as high as 102.5, with no clear source. Patient has been weak. She reports a dry cough but this has been present since her chemotherapy. She does report chills today. She denies any wheezing or shortness of breath. She does report some back pain in the lower back, this is new for the past 2 days. she describes this pain as constant. Today she does complain of pain at the site of her port. This occurred after her port was accessed. She states she normally receives numbing medication prior to port access, that was not done in the ER. The patient looks good, however, with her immunocompromised status and current fever the hospital service was requested to observation the patient. History was provided by the patient and significant other at bedside  Review of Systems:  The patient denies anorexia,  weight loss,, vision loss, decreased hearing, hoarseness, chest pain, syncope, dyspnea on exertion, peripheral edema, balance deficits, hemoptysis, abdominal pain, melena, hematochezia, severe indigestion/heartburn, hematuria, incontinence, genital sores, muscle weakness, suspicious skin lesions, transient blindness, difficulty walking, depression, unusual weight change, abnormal bleeding, enlarged lymph nodes, angioedema, and breast masses.  Past Medical History: Past Medical  History  Diagnosis Date  . Hypertension   . Diabetes mellitus   . Complication of anesthesia SLOW TO WAKE UP AFTER C SECTION  . Hodgkin lymphoma 01/18/2012  . Benign essential HTN 01/19/2012  . DM II (diabetes mellitus, type II), controlled 01/19/2012  . Eczema 01/19/2012  . Muscle spasm of back 03/09/2012  . hodgkins lymphoma dx'd 01/16/12    lt axilla and left neck   . S/P chemotherapy, time since 4-12 weeks 05/11/2012    4 cycles of ABVD with neulasta suppor and zoladex  . Asthma     seasonal   Past Surgical History  Procedure Date  . Dilation and curettage of uterus   . Cesarean section   . Axillary lymph node biopsy 12/2011    left  . Portacath placement 01/25/2012    Procedure: INSERTION PORT-A-CATH;  Surgeon: Atilano Ina, MD,FACS;  Location: [facility];  Service: General;  Laterality: Right;  right subclavian    Medications: Prior to Admission medications   Medication Sig Start Date End Date Taking? Authorizing Provider  emollient (BIAFINE) cream Apply 1 application topically 2 (two) times daily as needed. For rash.   Yes Historical Provider, MD  ergocalciferol (VITAMIN D2) 50000 UNITS capsule Take 50,000 Units by mouth every 7 (seven) days. Sunday   Yes Historical Provider, MD  HYDROcodone-acetaminophen (LORTAB) 7.5-500 MG/15ML solution Take 15 mLs by mouth every 6 (six) hours as needed. For pain.   Yes Historical Provider, MD  hydrocortisone cream 1 % Apply 1 application topically 2 (two) times daily as needed. For eczema   Yes Historical Provider, MD  lisinopril-hydrochlorothiazide (PRINZIDE,ZESTORETIC) 20-12.5 MG per tablet Take 1 tablet by  mouth daily.   Yes Historical Provider, MD  LORazepam (ATIVAN) 2 MG tablet Take 1 tablet (2 mg total) by mouth every 6 (six) hours as needed for anxiety (nausea or sleep). For anxiety 01/18/12  Yes Levert Feinstein, MD  metFORMIN (GLUCOPHAGE) 500 MG tablet Take 500 mg by mouth 2 (two) times daily with a meal.     Yes Historical Provider, MD    ondansetron (ZOFRAN-ODT) 8 MG disintegrating tablet Take 8 mg by mouth every 8 (eight) hours as needed. For nausea.   Yes Historical Provider, MD  sucralfate (CARAFATE) 1 GM/10ML suspension Take 10 mLs (1 g total) by mouth 4 (four) times daily. 07/02/12 08/01/12 Yes Maryln Gottron, MD  zolpidem (AMBIEN) 5 MG tablet Take 5-10 mg by mouth at bedtime as needed. For sleep. 01/30/12  Yes Levert Feinstein, MD    Allergies:   Allergies  Allergen Reactions  . Penicillins Rash    Social History:  reports that she quit smoking about 5 years ago. She has never used smokeless tobacco. She reports that she does not drink alcohol or use illicit drugs.  Family History: Family History  Problem Relation Age of Onset  . Diabetes Maternal Grandmother   . Heart disease Maternal Grandfather   . Cancer Neg Hx     Physical Exam: Filed Vitals:   07/24/12 2038 07/24/12 2140 07/24/12 2326 07/25/12 0113  BP: 136/72 122/70  115/61  Pulse: 107 106  96  Temp: 100.4 F (38 C) 102.3 F (39.1 C) 100.9 F (38.3 C) 98.3 F (36.8 C)  TempSrc: Oral Oral Oral Oral  Resp: 22 18  13   Height: 5\' 7"  (1.702 m)     Weight: 86.183 kg (190 lb)     SpO2: 100% 99%  97%    General:  Alert and oriented times three, well developed and nourished, no acute distress Eyes: PERRLA, pink conjunctiva, no scleral icterus ENT: Moist oral mucosa, neck supple, no thyromegaly Lungs: clear to ascultation, no wheeze, no crackles, no use of accessory muscles Cardiovascular: regular rate and rhythm, no regurgitation, no gallops, no murmurs. No carotid bruits, no JVD Abdomen: soft, positive BS, non-tender, non-distended, no organomegaly, not an acute abdomen GU: not examined Neuro: CN II - XII grossly intact, sensation intact Musculoskeletal: strength 5/5 all extremities, no clubbing, cyanosis or edema. Port right chest wall no fluctuance Skin: no rash, no subcutaneous crepitation, no decubitus. During off skin color on neck and on  the left axilla were patient received radiation therapy  Psych: appropriate patient   Labs on Admission:   [facility] 07/24/12 2310  NA 132*  K 3.7  CL 95*  CO2 20  GLUCOSE 76  BUN 9  CREATININE 0.76  CALCIUM 9.3  MG --  PHOS --    Basename 07/24/12 2310  AST 17  ALT 21  ALKPHOS 74  BILITOT 0.4  PROT 7.6  ALBUMIN 3.8   No results found for this basename: LIPASE:2,AMYLASE:2 in the last 72 hours  Basename 07/24/12 2310  WBC 3.6*  NEUTROABS 2.1  HGB 12.9  HCT 36.1  MCV 85.5  PLT 179   No results found for this basename: CKTOTAL:3,CKMB:3,CKMBINDEX:3,TROPONINI:3 in the last 72 hours No components found with this basename: POCBNP:3 No results found for this basename: DDIMER:2 in the last 72 hours No results found for this basename: HGBA1C:2 in the last 72 hours No results found for this basename: CHOL:2,HDL:2,LDLCALC:2,TRIG:2,CHOLHDL:2,LDLDIRECT:2 in the last 72 hours No results found for this basename: TSH,T4TOTAL,FREET3,T3FREE,THYROIDAB in  the last 72 hours No results found for this basename: VITAMINB12:2,FOLATE:2,FERRITIN:2,TIBC:2,IRON:2,RETICCTPCT:2 in the last 72 hours  Micro Results: No results found for this or any previous visit (from the past 240 hour(s)).  Results for Mary, French (MRN [id number]) as of 07/25/2012 04:47  Ref. Range 07/24/2012 22:55  Color, Urine Latest Range: YELLOW  YELLOW  APPearance Latest Range: CLEAR  CLOUDY (A)  Specific Gravity, Urine Latest Range: 1.005-1.030  1.037 (H)  pH Latest Range: 5.0-8.0  6.0  Glucose Latest Range: NEGATIVE mg/dL NEGATIVE  Bilirubin Urine Latest Range: NEGATIVE  SMALL (A)  Ketones, ur Latest Range: NEGATIVE mg/dL >91 (A)  Protein Latest Range: NEGATIVE mg/dL 30 (A)  Urobilinogen, UA Latest Range: 0.0-1.0 mg/dL 1.0  Nitrite Latest Range: NEGATIVE  NEGATIVE  Leukocytes, UA Latest Range: NEGATIVE  NEGATIVE  Hgb urine dipstick Latest Range: NEGATIVE  NEGATIVE  Urine-Other No range found MUCOUS PRESENT   WBC, UA Latest Range: <3 WBC/hpf 0-2  Squamous Epithelial / LPF Latest Range: RARE  MANY (A)  Bacteria, UA Latest Range: RARE  FEW (A)    Radiological Exams on Admission: Dg Chest 2 View  07/24/2012  *RADIOLOGY REPORT*  Clinical Data: Fever, cough.  CHEST - 2 VIEW  Comparison: 05/28/2012 PET CT  Findings: Right chest wall Port-A-Cath tip projects over the proximal SVC.  Mild peripheral reticular nodular opacities.  No pleural effusion or pneumothorax.  No acute osseous finding. Cardiomediastinal contours within normal range.  IMPRESSION: Mild peripheral reticular nodular opacities may correspond to the nodules described on recent PET CT. The differential includes infection or metastatic disease.   Original Report Authenticated By: Waneta Martins, M.D.     Assessment/Plan Present on Admission:  .Fever Unclear etiology Possible pneumonia, however, given you back pain with also MRI patient's lumbar spine Blood cultures x2 ordered in the ER, will add blood culture from the Mediport  Patient receive Rocephin, azithromycin in the ER, for now will not escalate current antibiotic choices but will add vancomycin Dry mouth/decreased urine output [place] likely due to chemotherapy, however, we will start some IV fluids  .Hodgkin disease .DM II (diabetes mellitus, type II), controlled .Benign essential HTN .Eczema .Muscle spasm of back  stable resume home medications ADA diet and sliding scale insulin  Full code DVT prophylaxis    [name] 07/25/2012, 4:47 AM

## 2012-07-26 ENCOUNTER — Observation Stay (HOSPITAL_COMMUNITY): Payer: Medicaid Other

## 2012-07-26 LAB — CBC WITH DIFFERENTIAL/PLATELET
Eosinophils Absolute: 0.3 10*3/uL (ref 0.0–0.7)
Eosinophils Relative: 9 % — ABNORMAL HIGH (ref 0–5)
HCT: 33.8 % — ABNORMAL LOW (ref 36.0–46.0)
Lymphocytes Relative: 18 % (ref 12–46)
Lymphs Abs: 0.6 10*3/uL — ABNORMAL LOW (ref 0.7–4.0)
MCH: 29.4 pg (ref 26.0–34.0)
MCV: 83.5 fL (ref 78.0–100.0)
Monocytes Absolute: 0.5 10*3/uL (ref 0.1–1.0)
Monocytes Relative: 15 % — ABNORMAL HIGH (ref 3–12)
RBC: 4.05 MIL/uL (ref 3.87–5.11)
WBC: 3.2 10*3/uL — ABNORMAL LOW (ref 4.0–10.5)

## 2012-07-26 LAB — URINE CULTURE: Colony Count: >=100000

## 2012-07-26 LAB — BASIC METABOLIC PANEL
BUN: 3 mg/dL — ABNORMAL LOW (ref 6–23)
CO2: 23 mEq/L (ref 19–32)
Calcium: 8.5 mg/dL (ref 8.4–10.5)
Creatinine, Ser: 0.67 mg/dL (ref 0.50–1.10)
GFR calc non Af Amer: >90 mL/min (ref >90)
Glucose, Bld: 117 mg/dL — ABNORMAL HIGH (ref 70–99)

## 2012-07-26 LAB — GLUCOSE, CAPILLARY

## 2012-07-26 MED ORDER — SODIUM CHLORIDE 0.9 % IV SOLN
INTRAVENOUS | Status: AC
  Administered 2012-07-26: 11:00:00 via INTRAVENOUS

## 2012-07-26 MED ORDER — GUAIFENESIN-DM 100-10 MG/5ML PO SYRP
10.0000 mL | ORAL_SOLUTION | ORAL | Status: DC | PRN
  Administered 2012-07-26 – 2012-07-29 (×2): 10 mL via ORAL
  Filled 2012-07-26 (×3): qty 10

## 2012-07-26 MED ORDER — ACETAMINOPHEN 325 MG PO TABS
650.0000 mg | ORAL_TABLET | Freq: Four times a day (QID) | ORAL | Status: DC | PRN
  Administered 2012-07-26 – 2012-07-28 (×3): 650 mg via ORAL
  Filled 2012-07-26 (×3): qty 2

## 2012-07-26 MED ORDER — SODIUM CHLORIDE 0.9 % IJ SOLN
10.0000 mL | INTRAMUSCULAR | Status: DC | PRN
  Administered 2012-07-26 – 2012-08-02 (×5): 10 mL

## 2012-07-26 NOTE — Progress Notes (Signed)
Temp 101.6 Dr Thedore Mins sent a text and informed.  New orders noted.

## 2012-07-26 NOTE — Progress Notes (Signed)
New orders noted for EKG.

## 2012-07-26 NOTE — Progress Notes (Signed)
T wave is more elevated text page sent to Dr. Thedore Mins.

## 2012-07-26 NOTE — Progress Notes (Signed)
[facility]                                                                                Patient Demographics  [name], is a 31 y.o. female  ZOX:[id number]  [id number]:[id number]  DOB - Nov 26, 1981  Admit date - 07/24/2012  Admitting Physician Gery Pray, MD  Outpatient Primary MD for the patient is Dorrene German, MD  LOS - 2   Chief Complaint  Patient presents with  . Fever        Assessment & Plan   1. Fevers with possible pneumonia in an immunocompromised patient who has history of Hodgkin's lymphoma and currently undergoing chemotherapy- blood cultures have been obtained negative so far, patient paced on appropriate IV antibiotics, gentle IV fluids, when necessary nebulizer and oxygen treatment as needed, oncology requested again to see the patient for her Hodgkin's disease and ongoing chemotherapy.    2. History of diabetes mellitus type 2- Glucophage will be held while she is in the hospital, sliding scale insulin a.c. at bedtime.   CBG (last 3)   Basename 07/25/12 2144 07/25/12 1655 07/25/12 1125  GLUCAP 100* 69* 165*     3. History of hypertension, back spasms and eczema - no acute issues home medication lisinopril will be continued and blood pressure will be closely monitored, supportive care with baclofen as needed for back spasms and steroid cream for left axillary eczema post radiation. MRI L spine is stable.     Code Status: Full  Family Communication: D/W patient and Family 07-25-12  Disposition Plan: Home    Procedures CXR   Consults Oncology   Antibiotics  Vanco,Mero,Levaquin 07-25-12  Anti-infectives     Start     Dose/Rate Route Frequency Ordered Stop   07/25/12 0900   levofloxacin (LEVAQUIN) IVPB 750 mg        750 mg 100 mL/hr over 90 Minutes Intravenous Daily 07/25/12 0802     07/25/12 0900   meropenem (MERREM) 1 g in sodium chloride 0.9 % 100 mL IVPB        1 g 200 mL/hr over 30 Minutes Intravenous  Every 8 hours 07/25/12 0802     07/25/12 0600   vancomycin (VANCOCIN) IVPB 1000 mg/200 mL premix        1,000 mg 200 mL/hr over 60 Minutes Intravenous Every 8 hours 07/25/12 0534     07/25/12 0515   cefTRIAXone (ROCEPHIN) 1 g in dextrose 5 % 50 mL IVPB  Status:  Discontinued        1 g 100 mL/hr over 30 Minutes Intravenous Every 24 hours 07/25/12 0503 07/25/12 0713   07/25/12 0515   azithromycin (ZITHROMAX) 500 mg in dextrose 5 % 250 mL IVPB  Status:  Discontinued        500 mg 250 mL/hr over 60 Minutes Intravenous Every 24 hours 07/25/12 0503 07/25/12 0810   07/25/12 0400   cefTRIAXone (ROCEPHIN) 1 g in dextrose 5 % 50 mL IVPB        1 g 100 mL/hr over 30 Minutes Intravenous  Once 07/25/12 0347 07/25/12 0709   07/25/12 0400   azithromycin (ZITHROMAX) tablet 500 mg  Status:  Discontinued  500 mg Oral  Once 07/25/12 0347 07/25/12 0713          Time Spent in minutes   35   DVT Prophylaxis  Lovenox    Lab Results  Component Value Date   PLT 173 07/26/2012     Scheduled Meds:    . enoxaparin (LOVENOX) injection  40 mg Subcutaneous Q24H  . folic acid      . lisinopril  20 mg Oral Daily   And  . hydrochlorothiazide  12.5 mg Oral Daily  . insulin aspart  0-15 Units Subcutaneous TID WC  . insulin aspart  0-5 Units Subcutaneous QHS  . levofloxacin (LEVAQUIN) IV  750 mg Intravenous Daily  . lidocaine  1 patch Transdermal Q24H  . meropenem (MERREM) IV  1 g Intravenous Q8H  . multivitamin with minerals      . sucralfate  1 g Oral TID AC & HS  . thiamine      . vancomycin  1,000 mg Intravenous Q8H   Continuous Infusions:    . 0.9 % NaCl with KCl 20 mEq / L 100 mL/hr at 07/26/12 0641   PRN Meds:.alum & mag hydroxide-simeth, baclofen, HYDROcodone-acetaminophen, hydrocortisone cream, LORazepam, morphine injection, ondansetron (ZOFRAN) IV, ondansetron, sodium chloride, zolpidem  Susa Raring K M.D on 07/26/2012 at 9:31 AM  Between 7am to 7pm - Pager -  [phone]  After 7pm go to [email or web address] - password [email or web address]  And look for the night coverage person covering for me after hours  [facility] Office  [phone]    Subjective:   [name] today has, No headache, No chest pain, No abdominal pain - No Nausea, No new weakness tingling or numbness, No Cough - SOB.    Objective:   Filed Vitals:   07/25/12 1537 07/25/12 2103 07/26/12 0439 07/26/12 0515  BP:  124/67  132/83  Pulse:  92  83  Temp: 100.4 F (38 C) 100.8 F (38.2 C) 100.2 F (37.9 C) 99.2 F (37.3 C)  TempSrc: Oral Oral  Oral  Resp:  19  19  Height:      Weight:      SpO2:  100%  99%    Wt Readings from Last 3 Encounters:  07/25/12 90.6 kg (199 lb 11.8 oz)  07/16/12 90.538 kg (199 lb 9.6 oz)  07/04/12 95.89 kg (211 lb 6.4 oz)     Intake/Output Summary (Last 24 hours) at 07/26/12 0931 Last data filed at 07/25/12 1300  Gross per 24 hour  Intake    480 ml  Output      1 ml  Net    479 ml    Exam Awake Alert, Oriented X 3, No new F.N deficits, Normal affect [place].AT,PERRAL Supple Neck,No JVD, No cervical lymphadenopathy appriciated.  Symmetrical Chest wall movement, Good air movement bilaterally, CTAB RRR,No Gallops,Rubs or new Murmurs, No Parasternal Heave +ve B.Sounds, Abd Soft, Non tender, No organomegaly appriciated, No rebound - guarding or rigidity. No Cyanosis, Clubbing or edema, No new Rash or bruise , L axilla skin darkened post radiation   Data Review    Radiology Reports Dg Chest 2 View  07/24/2012  *RADIOLOGY REPORT*  Clinical Data: Fever, cough.  CHEST - 2 VIEW  Comparison: 05/28/2012 PET CT  Findings: Right chest wall Port-A-Cath tip projects over the proximal SVC.  Mild peripheral reticular nodular opacities.  No pleural effusion or pneumothorax.  No acute osseous finding. Cardiomediastinal contours within normal range.  IMPRESSION: Mild peripheral reticular nodular opacities may correspond  to the nodules described on  recent PET CT. The differential includes infection or metastatic disease.   Original Report Authenticated By: Waneta Martins, M.D.    Mr Lumbar Spine W Wo Contrast  07/25/2012  *RADIOLOGY REPORT*  Clinical Data: Low back pain, fever, and history of Hodgkin's lymphoma.  MRI LUMBAR SPINE WITHOUT AND WITH CONTRAST  Technique:  Multiplanar and multiecho pulse sequences of the lumbar spine were obtained without and with intravenous contrast.  Contrast: 18mL MULTIHANCE GADOBENATE DIMEGLUMINE 529 MG/ML IV SOLN  Comparison: None.  Findings: Normal conus tip is at L2.  Normal paraspinal soft tissues.  The discs from T11-12 through L5-S1 are normal.  There is no facet arthritis, spinal stenosis, spondylolisthesis, or other abnormality.  There is no pathologic enhancement after contrast administration.  IMPRESSION: Normal MRI of the lumbar spine.   Original Report Authenticated By: Gwynn Burly, M.D.     CBC  Lab 07/26/12 0550 07/25/12 0642 07/24/12 2310  WBC 3.2* 2.8* 3.6*  HGB 11.9* 11.5* 12.9  HCT 33.8* 32.7* 36.1  PLT 173 165 179  MCV 83.5 85.4 85.5  MCH 29.4 30.0 30.6  MCHC 35.2 35.2 35.7  RDW 12.0 12.2 12.4  LYMPHSABS 0.6* -- 0.5*  MONOABS 0.5 -- 0.7  EOSABS 0.3 -- 0.3  BASOSABS 0.0 -- 0.0  BANDABS -- -- --    Chemistries   Lab 07/26/12 0550 07/25/12 0642 07/24/12 2310  NA 132* -- 132*  K 3.5 -- 3.7  CL 98 -- 95*  CO2 23 -- 20  GLUCOSE 117* -- 76  BUN 3* -- 9  CREATININE 0.67 0.68 0.76  CALCIUM 8.5 -- 9.3  MG -- -- --  AST -- -- 17  ALT -- -- 21  ALKPHOS -- -- 74  BILITOT -- -- 0.4   ------------------------------------------------------------------------------------------------------------------ estimated creatinine clearance is 121 ml/min (by C-G formula based on Cr of 0.67). ------------------------------------------------------------------------------------------------------------------ No results found for this basename: HGBA1C:2 in the last 72  hours ------------------------------------------------------------------------------------------------------------------ No results found for this basename: CHOL:2,HDL:2,LDLCALC:2,TRIG:2,CHOLHDL:2,LDLDIRECT:2 in the last 72 hours ------------------------------------------------------------------------------------------------------------------ No results found for this basename: TSH,T4TOTAL,FREET3,T3FREE,THYROIDAB in the last 72 hours ------------------------------------------------------------------------------------------------------------------ No results found for this basename: VITAMINB12:2,FOLATE:2,FERRITIN:2,TIBC:2,IRON:2,RETICCTPCT:2 in the last 72 hours  Coagulation profile No results found for this basename: INR:5,PROTIME:5 in the last 168 hours  No results found for this basename: DDIMER:2 in the last 72 hours  Cardiac Enzymes No results found for this basename: CK:3,CKMB:3,TROPONINI:3,MYOGLOBIN:3 in the last 168 hours ------------------------------------------------------------------------------------------------------------------ No components found with this basename: POCBNP:3

## 2012-07-26 NOTE — Progress Notes (Addendum)
ANTIBIOTIC CONSULT NOTE - FOLLOW UP  Pharmacy Consult for:  Vancomycin, Levofloxacin, Meropenem  Indication:  Pneumonia  Allergies  Allergen Reactions  . Penicillins Rash    Patient Measurements: Height: 5\' 7"  (170.2 cm) Weight: 199 lb 11.8 oz (90.6 kg) IBW/kg (Calculated) : 61.6   Vital Signs: Temp: 99.2 F (37.3 C) (08/29 1901) Temp src: Oral (08/29 1500) BP: 136/83 mmHg (08/29 1500) Pulse Rate: 84  (08/29 1500) Intake/Output from previous day: 08/28 0701 - 08/29 0700 In: 480 [P.O.:480] Out: 1 [Urine:1]  Labs:  [facility] 07/26/12 0550 07/25/12 0642 07/24/12 2310  WBC 3.2* 2.8* 3.6*  HGB 11.9* 11.5* 12.9  PLT 173 165 179  LABCREA -- -- --  CREATININE 0.67 0.68 0.76   Estimated Creatinine Clearance: 121 ml/min (by C-G formula based on Cr of 0.67).  Basename 07/26/12 1730  VANCOTROUGH 15.4  VANCOPEAK --  Drue Dun --  GENTTROUGH --  GENTPEAK --  GENTRANDOM --  TOBRATROUGH --  TOBRAPEAK --  TOBRARND --  AMIKACINPEAK --  AMIKACINTROU --  AMIKACIN --     Microbiology: Recent Results (from the past 720 hour(s))  URINE CULTURE     Status: Normal   Collection Time   07/24/12 10:59 PM      Component Value Range Status Comment   Specimen Description URINE, CLEAN CATCH   Final    Special Requests Immunocompromised   Final    Culture  Setup Time 07/25/2012 05:50   Final    Colony Count >=100,000 COLONIES/ML   Final    Culture     Final    Value: Multiple bacterial morphotypes present, none predominant. Suggest appropriate recollection if clinically indicated.   Report Status 07/26/2012 FINAL   Final   CULTURE, BLOOD (ROUTINE X 2)     Status: Normal (Preliminary result)   Collection Time   07/24/12 11:10 PM      Component Value Range Status Comment   Specimen Description BLOOD RIGHT ANTECUBITAL   Final    Special Requests BOTTLES DRAWN AEROBIC AND ANAEROBIC 4CC   Final    Culture  Setup Time 07/25/2012 04:36   Final    Culture     Final    Value:         BLOOD CULTURE RECEIVED NO GROWTH TO DATE CULTURE WILL BE HELD FOR 5 DAYS BEFORE ISSUING A FINAL NEGATIVE REPORT   Report Status PENDING   Incomplete   CULTURE, BLOOD (ROUTINE X 2)     Status: Normal (Preliminary result)   Collection Time   07/24/12 11:20 PM      Component Value Range Status Comment   Specimen Description BLOOD NO SITE INDICATED   Final    Special Requests     Final    Value: BOTTLES DRAWN AEROBIC AND ANAEROBIC NO VOLUME INDICATED   Culture  Setup Time 07/25/2012 04:36   Final    Culture     Final    Value:        BLOOD CULTURE RECEIVED NO GROWTH TO DATE CULTURE WILL BE HELD FOR 5 DAYS BEFORE ISSUING A FINAL NEGATIVE REPORT   Report Status PENDING   Incomplete   CULTURE, BLOOD (SINGLE)     Status: Normal (Preliminary result)   Collection Time   07/25/12  6:30 AM      Component Value Range Status Comment   Specimen Description BLOOD PORTA CATH FROM MEDIPORT   Final    Special Requests BOTTLES DRAWN AEROBIC AND ANAEROBIC 10CC EACH   Final  Culture  Setup Time 07/25/2012 11:00   Final    Culture     Final    Value:        BLOOD CULTURE RECEIVED NO GROWTH TO DATE CULTURE WILL BE HELD FOR 5 DAYS BEFORE ISSUING A FINAL NEGATIVE REPORT   Report Status PENDING   Incomplete     Anti-infectives:  Levofloxacin 750 mg IV every 24 hours  Meropenem 1 gram IV every 8 hours  Vancomycin 1000 mg IV every 8 hours  Assessment:  Assisting with Vancomycin therapy for this 31 year-old female with suspected pneumonia  Received treatment for  Hodgkin's lymphoma, completing chemotherapy on 05/11/12 with Neulasta on 05/12/12,  and radiation therapy on 07/16/12.  Vancomycin trough level drawn prior to the 18:00 dose today is reported as 15.4 mcg/ml.  This level is within the therapeutic range.  Goals of Therapy:   Vancomycin trough levels 15-20 mcg/ml  Eradication of infection  Plan:  Continue all antibiotics at the current doses. Repeat Vancomycin trough level as needed to guide  dosing.  Polo Riley R.Ph. 07/26/2012 8:53 PM

## 2012-07-27 ENCOUNTER — Ambulatory Visit: Payer: Medicaid Other | Admitting: Oncology

## 2012-07-27 ENCOUNTER — Inpatient Hospital Stay (HOSPITAL_COMMUNITY): Payer: Medicaid Other

## 2012-07-27 ENCOUNTER — Other Ambulatory Visit: Payer: Medicaid Other | Admitting: Lab

## 2012-07-27 ENCOUNTER — Encounter: Payer: Self-pay | Admitting: Oncology

## 2012-07-27 ENCOUNTER — Encounter (HOSPITAL_COMMUNITY): Payer: Self-pay | Admitting: Oncology

## 2012-07-27 DIAGNOSIS — R918 Other nonspecific abnormal finding of lung field: Secondary | ICD-10-CM

## 2012-07-27 DIAGNOSIS — Z8571 Personal history of Hodgkin lymphoma: Secondary | ICD-10-CM

## 2012-07-27 DIAGNOSIS — R059 Cough, unspecified: Secondary | ICD-10-CM

## 2012-07-27 DIAGNOSIS — R509 Fever, unspecified: Secondary | ICD-10-CM

## 2012-07-27 DIAGNOSIS — C8190 Hodgkin lymphoma, unspecified, unspecified site: Secondary | ICD-10-CM

## 2012-07-27 DIAGNOSIS — R05 Cough: Secondary | ICD-10-CM

## 2012-07-27 DIAGNOSIS — R053 Chronic cough: Secondary | ICD-10-CM

## 2012-07-27 HISTORY — DX: Chronic cough: R05.3

## 2012-07-27 HISTORY — DX: Personal history of Hodgkin lymphoma: Z85.71

## 2012-07-27 LAB — CBC WITH DIFFERENTIAL/PLATELET
Basophils Relative: 0 % (ref 0–1)
HCT: 33.3 % — ABNORMAL LOW (ref 36.0–46.0)
Hemoglobin: 11.8 g/dL — ABNORMAL LOW (ref 12.0–15.0)
Lymphocytes Relative: 23 % (ref 12–46)
MCHC: 35.4 g/dL (ref 30.0–36.0)
Monocytes Absolute: 0.3 10*3/uL (ref 0.1–1.0)
Monocytes Relative: 10 % (ref 3–12)
Neutro Abs: 1.6 10*3/uL — ABNORMAL LOW (ref 1.7–7.7)

## 2012-07-27 LAB — BASIC METABOLIC PANEL
BUN: <3 mg/dL — ABNORMAL LOW (ref 6–23)
CO2: 27 mEq/L (ref 19–32)
Chloride: 100 mEq/L (ref 96–112)
Creatinine, Ser: 0.66 mg/dL (ref 0.50–1.10)
Glucose, Bld: 103 mg/dL — ABNORMAL HIGH (ref 70–99)

## 2012-07-27 MED ORDER — POTASSIUM CHLORIDE CRYS ER 20 MEQ PO TBCR
40.0000 meq | EXTENDED_RELEASE_TABLET | Freq: Once | ORAL | Status: AC
  Administered 2012-07-27: 40 meq via ORAL
  Filled 2012-07-27: qty 2

## 2012-07-27 MED ORDER — METOPROLOL TARTRATE 25 MG PO TABS
25.0000 mg | ORAL_TABLET | Freq: Two times a day (BID) | ORAL | Status: DC
  Administered 2012-07-27 – 2012-08-02 (×10): 25 mg via ORAL
  Filled 2012-07-27 (×14): qty 1

## 2012-07-27 MED ORDER — AMLODIPINE BESYLATE 10 MG PO TABS
10.0000 mg | ORAL_TABLET | Freq: Every day | ORAL | Status: DC
  Administered 2012-07-27 – 2012-07-30 (×2): 10 mg via ORAL
  Filled 2012-07-27 (×4): qty 1

## 2012-07-27 MED ORDER — IOHEXOL 300 MG/ML  SOLN
80.0000 mL | Freq: Once | INTRAMUSCULAR | Status: AC | PRN
  Administered 2012-07-27: 80 mL via INTRAVENOUS

## 2012-07-27 MED ORDER — SULFAMETHOXAZOLE-TRIMETHOPRIM 400-80 MG/5ML IV SOLN
450.0000 mg | Freq: Three times a day (TID) | INTRAVENOUS | Status: DC
  Administered 2012-07-27 – 2012-08-01 (×15): 450 mg via INTRAVENOUS
  Filled 2012-07-27 (×19): qty 28.1

## 2012-07-27 MED ORDER — SODIUM CHLORIDE 0.9 % IV SOLN
INTRAVENOUS | Status: AC
  Administered 2012-07-28: 02:00:00 via INTRAVENOUS

## 2012-07-27 MED ORDER — ALTEPLASE 2 MG IJ SOLR
2.0000 mg | Freq: Once | INTRAMUSCULAR | Status: AC
  Administered 2012-07-27: 2 mg
  Filled 2012-07-27: qty 2

## 2012-07-27 NOTE — Progress Notes (Addendum)
ANTIBIOTIC CONSULT NOTE - INITIAL  Pharmacy Consult for IV Septra/Levaquin Indication: PNA  Allergies  Allergen Reactions  . Penicillins Rash    Patient Measurements: Height: 5\' 7"  (170.2 cm) Weight: 199 lb 11.8 oz (90.6 kg) IBW/kg (Calculated) : 61.6    Vital Signs: Temp: 99.1 F (37.3 C) (08/30 0532) Temp src: Oral (08/30 0532) BP: 130/77 mmHg (08/30 0532) Pulse Rate: 82  (08/30 0532) Intake/Output from previous day: 08/29 0701 - 08/30 0700 In: 3270.8 [P.O.:600; I.V.:568.8; IV Piggyback:2102] Out: 3 [Urine:3] Intake/Output from this shift:    Labs:  Basename 07/27/12 0526 07/26/12 0550 07/25/12 0642  WBC 2.9* 3.2* 2.8*  HGB 11.8* 11.9* 11.5*  PLT 164 173 165  LABCREA -- -- --  CREATININE 0.66 0.67 0.68   Estimated Creatinine Clearance: 121 ml/min (by C-G formula based on Cr of 0.66).  Basename 07/26/12 1730  VANCOTROUGH 15.4  VANCOPEAK --  Drue Dun --  GENTTROUGH --  GENTPEAK --  GENTRANDOM --  TOBRATROUGH --  TOBRAPEAK --  TOBRARND --  AMIKACINPEAK --  AMIKACINTROU --  AMIKACIN --     Microbiology: Recent Results (from the past 720 hour(s))  URINE CULTURE     Status: Normal   Collection Time   07/24/12 10:59 PM      Component Value Range Status Comment   Specimen Description URINE, CLEAN CATCH   Final    Special Requests Immunocompromised   Final    Culture  Setup Time 07/25/2012 05:50   Final    Colony Count >=100,000 COLONIES/ML   Final    Culture     Final    Value: Multiple bacterial morphotypes present, none predominant. Suggest appropriate recollection if clinically indicated.   Report Status 07/26/2012 FINAL   Final   CULTURE, BLOOD (ROUTINE X 2)     Status: Normal (Preliminary result)   Collection Time   07/24/12 11:10 PM      Component Value Range Status Comment   Specimen Description BLOOD RIGHT ANTECUBITAL   Final    Special Requests BOTTLES DRAWN AEROBIC AND ANAEROBIC 4CC   Final    Culture  Setup Time 07/25/2012 04:36   Final     Culture     Final    Value:        BLOOD CULTURE RECEIVED NO GROWTH TO DATE CULTURE WILL BE HELD FOR 5 DAYS BEFORE ISSUING A FINAL NEGATIVE REPORT   Report Status PENDING   Incomplete   CULTURE, BLOOD (ROUTINE X 2)     Status: Normal (Preliminary result)   Collection Time   07/24/12 11:20 PM      Component Value Range Status Comment   Specimen Description BLOOD NO SITE INDICATED   Final    Special Requests     Final    Value: BOTTLES DRAWN AEROBIC AND ANAEROBIC NO VOLUME INDICATED   Culture  Setup Time 07/25/2012 04:36   Final    Culture     Final    Value:        BLOOD CULTURE RECEIVED NO GROWTH TO DATE CULTURE WILL BE HELD FOR 5 DAYS BEFORE ISSUING A FINAL NEGATIVE REPORT   Report Status PENDING   Incomplete   CULTURE, BLOOD (SINGLE)     Status: Normal (Preliminary result)   Collection Time   07/25/12  6:30 AM      Component Value Range Status Comment   Specimen Description BLOOD PORTA CATH FROM MEDIPORT   Final    Special Requests BOTTLES DRAWN AEROBIC AND ANAEROBIC  10CC EACH   Final    Culture  Setup Time 07/25/2012 11:00   Final    Culture     Final    Value:        BLOOD CULTURE RECEIVED NO GROWTH TO DATE CULTURE WILL BE HELD FOR 5 DAYS BEFORE ISSUING A FINAL NEGATIVE REPORT   Report Status PENDING   Incomplete     Medical History: Past Medical History  Diagnosis Date  . Hypertension   . Diabetes mellitus   . Complication of anesthesia SLOW TO WAKE UP AFTER C SECTION  . Hodgkin lymphoma 01/18/2012  . Benign essential HTN 01/19/2012  . DM II (diabetes mellitus, type II), controlled 01/19/2012  . Eczema 01/19/2012  . Muscle spasm of back 03/09/2012  . hodgkins lymphoma dx'd 01/16/12    lt axilla and left neck   . S/P chemotherapy, time since 4-12 weeks 05/11/2012    4 cycles of ABVD with neulasta suppor and zoladex  . Asthma     seasonal  . Cough, persistent 07/27/2012    Medications:  Scheduled:    . alteplase  2 mg Intracatheter Once  . amLODipine  10 mg Oral Daily    . enoxaparin (LOVENOX) injection  40 mg Subcutaneous Q24H  . insulin aspart  0-15 Units Subcutaneous TID WC  . insulin aspart  0-5 Units Subcutaneous QHS  . levofloxacin (LEVAQUIN) IV  750 mg Intravenous Daily  . lidocaine  1 patch Transdermal Q24H  . metoprolol tartrate  25 mg Oral BID  . potassium chloride  40 mEq Oral Once  . sucralfate  1 g Oral TID AC & HS  . sulfamethoxazole-trimethoprim  450 mg Intravenous Q8H  . DISCONTD: hydrochlorothiazide  12.5 mg Oral Daily  . DISCONTD: lisinopril  20 mg Oral Daily  . DISCONTD: meropenem (MERREM) IV  1 g Intravenous Q8H  . DISCONTD: vancomycin  1,000 mg Intravenous Q8H   Infusions:    . sodium chloride 75 mL/hr at 07/26/12 1036  . sodium chloride     PRN: acetaminophen, alum & mag hydroxide-simeth, baclofen, guaiFENesin-dextromethorphan, HYDROcodone-acetaminophen, hydrocortisone cream, iohexol, LORazepam, morphine injection, ondansetron (ZOFRAN) IV, ondansetron, sodium chloride, zolpidem Assessment: 31 yo F with hx of Nodular Sclerosing Hodgkin's lymphoma (Stage IIB) admitted with pulmonary infiltrates and fever.  Patient was initially started on broad spectrum abx with vancomycin/levaquin/meropenem.  At this time  suspect that symptoms may be related to opportunistic infection/PNA with plan to narrow abx to Levaquin and Septra IV  Goal of Therapy:  Bactrim per renal function/erradication of infection   Plan:  1.) Start Septra 450 mg (5 mg/kg) IV Q8h; Continue Levaquin  2.) Monitor renal function  3.) Follow up cultures/labs 4.) follow with pulmonary/ID   Taimi Towe, Loma Messing PharmD Pager #: [phone] 1:32 PM 07/27/2012

## 2012-07-27 NOTE — Progress Notes (Signed)
Dr Thedore Mins informed of pt on tele.  Dr Thedore Mins states to remove tele.  Tele removed
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
## 2012-07-27 NOTE — Consult Note (Signed)
Referring MD: Dr Thedore Mins, [facility]  PCP:    Reason for Referral: Unexplained cough and fever in a patient who recently completed treatment for Hodgkin's lymphoma   Chief Complaint  Patient presents with  . Fever    HPI:  Pleasant 31 year old woman with hypertension and type 1 diabetes who presented with fevers and progressive left axillary lymphadenopathy as first sign of nodular sclerosing Hodgkin's lymphoma. Diagnosis established by left axillary lymph node biopsy on 01/12/2012. She did have constitutional symptoms at presentation with weight loss and high fever but no night sweats. Staging evaluation including CT scan and PET scan showed no disease below the diaphragm. She was clinical stage IIB.She began chemotherapy with standard ABVD and received 4 cycles given on a day 1, day 15 schedule through June of this year and was then referred for involved field radiation which she recently completed. At time of a restaging evaluation prior to initiation of radiation, she had a PET/CT scan on July 1 but a diagnostic CT scan was not authorized by [facility] despite my appeal to a Medicaid sponsored oncologist.There was minimal residual metabolic activity in the mediastinum and left axilla but overall dramatic response to treatment;.also noted on that study were new, scattered, subcentimeter pulmonary nodules bilaterally which were not metabolically active. Scans were reviewed  in the thoracic conference. These pulmonary nodules were felt to be inflammatory in nature. At that point the patient was asymptomatic and I elected just to watch them proceed with planned radiation treatments. Patient tells me that almost from the first week of radiation she started to develop a nonproductive cough which was persistent and progressive over the next few weeks. She was started oral antibiotics and antitussives. Cough did not improve. She was due for a restaging CT scan last week on 8/26. I thought this would  also be helpful to see if we had any underlying pulmonary pathology that had progressed in the interval. However she was summarily declined by Medicaid once again I haven't had time to fight with them yet. In the interim she has developed high fevers and low back pain and presented to the emergency department on August 27. Plain chest radiograph shows persistent but stable pulmonary abnormalities in both lungs which are patchy, indistinct, and include some slightly nodular areas. These areas are very vague, they do not look like areas of consolidation, they have not progressed. In addition to the cough and changes on chest radiograph, she has now developed fevers as high as 102.7 despite broad-spectrum parenteral antibiotics started on admission 8/27. She denies any night sweats. She has persistent low back pain. Evaluation on admission included an MRI of the lumbar spine done on August 28 which did not show any gross abnormalities. No evidence for osteomyelitis or epidural abscess.  She has not had any tenderness or other problems with her Port-A-Cath infusion device which is still in place in the right subclavian position.  Past Medical History  Diagnosis Date  . Hypertension   . Diabetes mellitus   . Complication of anesthesia SLOW TO WAKE UP AFTER C SECTION  . Hodgkin lymphoma 01/18/2012  . Benign essential HTN 01/19/2012  . DM II (diabetes mellitus, type II), controlled 01/19/2012  . Eczema 01/19/2012  . Muscle spasm of back 03/09/2012  . hodgkins lymphoma dx'd 01/16/12    lt axilla and left neck   . S/P chemotherapy, time since 4-12 weeks 05/11/2012    4 cycles of ABVD with neulasta suppor and zoladex  . Asthma  seasonal  :  Past Surgical History  Procedure Date  . Dilation and curettage of uterus   . Cesarean section   . Axillary lymph node biopsy 12/2011    left  . Portacath placement 01/25/2012    Procedure: INSERTION PORT-A-CATH;  Surgeon: Atilano Ina, MD,FACS;  Location: [facility];   Service: General;  Laterality: Right;  right subclavian  :     . enoxaparin (LOVENOX) injection  40 mg Subcutaneous Q24H  . lisinopril  20 mg Oral Daily   And  . hydrochlorothiazide  12.5 mg Oral Daily  . insulin aspart  0-15 Units Subcutaneous TID WC  . insulin aspart  0-5 Units Subcutaneous QHS  . levofloxacin (LEVAQUIN) IV  750 mg Intravenous Daily  . lidocaine  1 patch Transdermal Q24H  . meropenem (MERREM) IV  1 g Intravenous Q8H  . potassium chloride  40 mEq Oral Once  . sucralfate  1 g Oral TID AC & HS  . vancomycin  1,000 mg Intravenous Q8H  :  Allergies  Allergen Reactions  . Penicillins Rash  :  Family History  Problem Relation Age of Onset  . Diabetes Maternal Grandmother   . Heart disease Maternal Grandfather   . Cancer Neg Hx   :  History   Social History  . Marital Status: Single    Spouse Name: N/A    Number of Children: N/A  . Years of Education: N/A   Occupational History  . Not on file.   Social History Main Topics  . Smoking status: Former Smoker    Quit date: 11/19/2006  . Smokeless tobacco: Never Used  . Alcohol Use: No  . Drug Use: No  . Sexually Active: Yes    Birth Control/ Protection: IUD   Other Topics Concern  . Not on file   Social History Narrative  . No narrative on file  :  ROS: In addition to the symptoms recorded in history of present illness, she noted that despite that she was increasing her fluid intake, she had decreasing urine output prior to admission and urine was dark golden in color instead of being clear.  Throat: She developed a mild radiation esophagitis. Burning and hyperpigmentation of the skin in the left axilla with no skin breakdown. No headache or change in vision.   Vitals: Filed Vitals:   07/27/12 0532  BP: 130/77  Pulse: 82  Temp: 99.1 F (37.3 C)  Resp: 18    PHYSICAL EXAM: General appearance: A pleasant, not toxic-appearing, African American woman Head: Normal Eyes: Normal Throat: No  erythema or exudate Neck: Full range of motion Lymph Nodes: Resolved left axillary adenopathy no cervical or supraclavicular adenopathy Resp: Lungs are clear to auscultation and resonant to percussion throughout; Port-A-Cath infusion device right chest wall no tenderness erythema or exudate Cardio: Regular rhythm no murmur Breasts: Not examined GI: Abdomen soft nontender no mass no organomegaly GU: Not examined Extremities: No edema no calf tenderness Vascular: No cyanosis Neurologic: Grossly normal Skin: Hyperpigmentation in the left axilla post recent radiation no skin breakdown, no abscess, no rash  Labs:   Basename 07/27/12 0526 07/26/12 0550  WBC 2.9* 3.2*  HGB 11.8* 11.9*  HCT 33.3* 33.8*  PLT 164 173    white blood count differential with a total white count 2900:56% neutrophils, 23% lymphocytes, 10% monocytes, 12% eosinophils  Images Studies/Results:  Dg Chest 2 View  07/26/2012  *RADIOLOGY REPORT*  Clinical Data: Pneumonia, cough  CHEST - 2 VIEW  Comparison: 07/24/2012  Findings: Stable patchy/nodular opacities in the right mid lung and left upper lobe. No pleural effusion or pneumothorax.  Stable right chest power port.  The heart is normal in size.  IMPRESSION: Stable patchy/nodular opacities in the right midlung and left upper lobe.  Differential considerations remain pneumonia or lymphomatous involvement.  Follow-up radiographs are suggested to assess for clearing.   Original Report Authenticated By: Charline Bills, M.D.    US Renal  07/25/2012  *RADIOLOGY REPORT*  Clinical Data: Urinary retention.  Back pain.  History of diabetes and Hodgkin's lymphoma.  RENAL/URINARY TRACT ULTRASOUND COMPLETE  Comparison:  None.  Findings:  Right Kidney:  11.3 cm. Normal echotexture.  Normal central sinus echo complex.  No calculi or hydronephrosis.  Left Kidney:  12.5 cm. Normal echotexture.  Normal central sinus echo complex.  No calculi or hydronephrosis.  Bladder:  Normal.   IMPRESSION: Negative renal ultrasound.   Original Report Authenticated By: Andreas Newport, M.D.        Assessment and Plan:  Nonproductive cough, high fever in a hectic curve, vague nodular pulmonary infiltrates on regular chest x-ray, in a lady who recently completed both chemotherapy and radiation therapy for stage IIb Hodgkin's lymphoma.  I think her symptom complex is more consistent with an opportunistic infection than it is with early recurrence of Hodgkin's disease. In view of the T. cell immune deficit associated with Hodgkin's lymphoma and additional immunosuppression from both chemotherapy and radiation, opportunistic infection would be common particularly with virus, fungus, and pneumocystis.  Recommendation: I would stop vancomycin and meropenem. Continue Levaquin. Begin Septra IV. Diagnostic CT scan of the chest Infectious disease consultation  Thank you for this consultation. All follow closely with you. Management discussed with Dr. Lollie Sails M 07/27/2012, 7:41 AM

## 2012-07-27 NOTE — Progress Notes (Unsigned)
Patient who recently completed chemotherapy and radiation therapy for stage IIB Hodgkin's lymphoma. She was admitted to the hospital on August 27 for further evaluation of progressive cough and high fever. Please see my consultation note from today August 30 in the inpatient record. Outpatient visit will be rescheduled at time of discharge.

## 2012-07-27 NOTE — Progress Notes (Signed)
[facility]                                                                                Patient Demographics  [name], is a 31 y.o. female  WUJ:[id number]  [id number]:[id number]  DOB - August 17, 1981  Admit date - 07/24/2012  Admitting Physician Gery Pray, MD  Outpatient Primary MD for the patient is Dorrene German, MD  LOS - 3   Chief Complaint  Patient presents with  . Fever        Assessment & Plan   1. Fevers with possible pneumonia in an immunocompromised patient who has history of Hodgkin's lymphoma and currently undergoing chemotherapy- blood cultures have been obtained negative so far, patient paced on appropriate IV antibiotics including vancomycin meropenem and Levaquin started on 07/24/2012, continue gentle IV fluids, when necessary nebulizer and oxygen treatment as needed, discussed with oncology bedside  on 07/27/2012, according to Dr. Cyndie Chime. patient has had atypical Pap done infiltrates on previous CTs also, with persistent fevers despite IV antibiotics suspicion for atypical versus fungal infection remains high, have requested infectious disease to see the patient also have requested pulmonary to consider Bronch for a good diagnostic specimen.    2. History of diabetes mellitus type 2- Glucophage will be held while she is in the hospital, sliding scale insulin a.c. at bedtime.   CBG (last 3)   Basename 07/26/12 2110 07/26/12 1621 07/26/12 1136  GLUCAP 106* 89 123*      3. History of hypertension, back spasms and eczema - no acute issues home medication lisinopril and hydrochlorothiazide will be held as patient is running fevers and potentially getting IV contrast for CT scan , I would like to avoid any dehydration and acute renal insufficiency, at this time we'll switch her to Norvasc and Lopressor, supportive care with baclofen as needed for back spasms and steroid cream for left axillary eczema post radiation. MRI L spine is  stable.     Code Status: Full  Family Communication: D/W patient and Family 07-25-12  Disposition Plan: Home    Procedures - CXR, CT chest, may get bronch   Consults  - Oncology, infectious disease, pulmonary   Antibiotics  Vanco,Mero, Levaquin 07-25-12   Anti-infectives     Start     Dose/Rate Route Frequency Ordered Stop   07/25/12 0900   levofloxacin (LEVAQUIN) IVPB 750 mg        750 mg 100 mL/hr over 90 Minutes Intravenous Daily 07/25/12 0802     07/25/12 0900   meropenem (MERREM) 1 g in sodium chloride 0.9 % 100 mL IVPB        1 g 200 mL/hr over 30 Minutes Intravenous Every 8 hours 07/25/12 0802     07/25/12 0600   vancomycin (VANCOCIN) IVPB 1000 mg/200 mL premix        1,000 mg 200 mL/hr over 60 Minutes Intravenous Every 8 hours 07/25/12 0534     07/25/12 0515   cefTRIAXone (ROCEPHIN) 1 g in dextrose 5 % 50 mL IVPB  Status:  Discontinued        1 g 100 mL/hr over 30 Minutes Intravenous Every 24 hours 07/25/12 0503 07/25/12 4696  07/25/12 0515   azithromycin (ZITHROMAX) 500 mg in dextrose 5 % 250 mL IVPB  Status:  Discontinued        500 mg 250 mL/hr over 60 Minutes Intravenous Every 24 hours 07/25/12 0503 07/25/12 0810   07/25/12 0400   cefTRIAXone (ROCEPHIN) 1 g in dextrose 5 % 50 mL IVPB        1 g 100 mL/hr over 30 Minutes Intravenous  Once 07/25/12 0347 07/25/12 0709   07/25/12 0400   azithromycin (ZITHROMAX) tablet 500 mg  Status:  Discontinued        500 mg Oral  Once 07/25/12 0347 07/25/12 0713          Time Spent in minutes   35   DVT Prophylaxis  Lovenox    Lab Results  Component Value Date   PLT 164 07/27/2012     Scheduled Meds:    . enoxaparin (LOVENOX) injection  40 mg Subcutaneous Q24H  . lisinopril  20 mg Oral Daily   And  . hydrochlorothiazide  12.5 mg Oral Daily  . insulin aspart  0-15 Units Subcutaneous TID WC  . insulin aspart  0-5 Units Subcutaneous QHS  . levofloxacin (LEVAQUIN) IV  750 mg Intravenous Daily  .  lidocaine  1 patch Transdermal Q24H  . meropenem (MERREM) IV  1 g Intravenous Q8H  . potassium chloride  40 mEq Oral Once  . sucralfate  1 g Oral TID AC & HS  . vancomycin  1,000 mg Intravenous Q8H   Continuous Infusions:    . sodium chloride 75 mL/hr at 07/26/12 1036  . sodium chloride    . 0.9 % NaCl with KCl 20 mEq / L 100 mL/hr at 07/26/12 0641   PRN Meds:.acetaminophen, alum & mag hydroxide-simeth, baclofen, guaiFENesin-dextromethorphan, HYDROcodone-acetaminophen, hydrocortisone cream, LORazepam, morphine injection, ondansetron (ZOFRAN) IV, ondansetron, sodium chloride, zolpidem  Susa Raring K M.D on 07/27/2012 at 7:38 AM  Between 7am to 7pm - Pager - [phone]  After 7pm go to [email or web address] - password [email or web address]  And look for the night coverage person covering for me after hours  [facility] Office  [phone]    Subjective:   [name] today has, No headache, No chest pain, No abdominal pain - No Nausea, No new weakness tingling or numbness, No Cough - SOB.  Dull low-back pain which is nonradiating.  Objective:   Filed Vitals:   07/26/12 1901 07/26/12 2108 07/26/12 2245 07/27/12 0532  BP:  161/99  130/77  Pulse:  87  82  Temp: 99.2 F (37.3 C) 102.7 F (39.3 C) 100.4 F (38 C) 99.1 F (37.3 C)  TempSrc:  Oral Oral Oral  Resp:  18  18  Height:      Weight:      SpO2:  99%  99%    Wt Readings from Last 3 Encounters:  07/25/12 90.6 kg (199 lb 11.8 oz)  07/16/12 90.538 kg (199 lb 9.6 oz)  07/04/12 95.89 kg (211 lb 6.4 oz)     Intake/Output Summary (Last 24 hours) at 07/27/12 0738 Last data filed at 07/27/12 0517  Gross per 24 hour  Intake 3270.75 ml  Output      3 ml  Net 3267.75 ml    Exam Awake Alert, Oriented X 3, No new F.N deficits, Normal affect [place].AT,PERRAL Supple Neck,No JVD, No cervical lymphadenopathy appriciated.  Symmetrical Chest wall movement, Good air movement bilaterally, CTAB RRR,No Gallops,Rubs or new  Murmurs, No Parasternal Heave +ve B.Sounds, Abd Soft, Non  tender, No organomegaly appriciated, No rebound - guarding or rigidity. No Cyanosis, Clubbing or edema, No new Rash or bruise , L axilla skin darkened post radiation   Data Review    Radiology Reports Dg Chest 2 View  07/24/2012  *RADIOLOGY REPORT*  Clinical Data: Fever, cough.  CHEST - 2 VIEW  Comparison: 05/28/2012 PET CT  Findings: Right chest wall Port-A-Cath tip projects over the proximal SVC.  Mild peripheral reticular nodular opacities.  No pleural effusion or pneumothorax.  No acute osseous finding. Cardiomediastinal contours within normal range.  IMPRESSION: Mild peripheral reticular nodular opacities may correspond to the nodules described on recent PET CT. The differential includes infection or metastatic disease.   Original Report Authenticated By: Waneta Martins, M.D.    Mr Lumbar Spine W Wo Contrast  07/25/2012  *RADIOLOGY REPORT*  Clinical Data: Low back pain, fever, and history of Hodgkin's lymphoma.  MRI LUMBAR SPINE WITHOUT AND WITH CONTRAST  Technique:  Multiplanar and multiecho pulse sequences of the lumbar spine were obtained without and with intravenous contrast.  Contrast: 18mL MULTIHANCE GADOBENATE DIMEGLUMINE 529 MG/ML IV SOLN  Comparison: None.  Findings: Normal conus tip is at L2.  Normal paraspinal soft tissues.  The discs from T11-12 through L5-S1 are normal.  There is no facet arthritis, spinal stenosis, spondylolisthesis, or other abnormality.  There is no pathologic enhancement after contrast administration.  IMPRESSION: Normal MRI of the lumbar spine.   Original Report Authenticated By: Gwynn Burly, M.D.     CBC  Lab 07/27/12 0526 07/26/12 0550 07/25/12 0642 07/24/12 2310  WBC 2.9* 3.2* 2.8* 3.6*  HGB 11.8* 11.9* 11.5* 12.9  HCT 33.3* 33.8* 32.7* 36.1  PLT 164 173 165 179  MCV 83.0 83.5 85.4 85.5  MCH 29.4 29.4 30.0 30.6  MCHC 35.4 35.2 35.2 35.7  RDW 12.1 12.0 12.2 12.4  LYMPHSABS 0.7 0.6*  -- 0.5*  MONOABS 0.3 0.5 -- 0.7  EOSABS 0.3 0.3 -- 0.3  BASOSABS 0.0 0.0 -- 0.0  BANDABS -- -- -- --    Chemistries   Lab 07/27/12 0526 07/26/12 0550 07/25/12 0642 07/24/12 2310  NA 136 132* -- 132*  K 3.4* 3.5 -- 3.7  CL 100 98 -- 95*  CO2 27 23 -- 20  GLUCOSE 103* 117* -- 76  BUN <3* 3* -- 9  CREATININE 0.66 0.67 0.68 0.76  CALCIUM 8.7 8.5 -- 9.3  MG -- -- -- --  AST -- -- -- 17  ALT -- -- -- 21  ALKPHOS -- -- -- 74  BILITOT -- -- -- 0.4   ------------------------------------------------------------------------------------------------------------------ estimated creatinine clearance is 121 ml/min (by C-G formula based on Cr of 0.66). ------------------------------------------------------------------------------------------------------------------ No results found for this basename: HGBA1C:2 in the last 72 hours ------------------------------------------------------------------------------------------------------------------ No results found for this basename: CHOL:2,HDL:2,LDLCALC:2,TRIG:2,CHOLHDL:2,LDLDIRECT:2 in the last 72 hours ------------------------------------------------------------------------------------------------------------------ No results found for this basename: TSH,T4TOTAL,FREET3,T3FREE,THYROIDAB in the last 72 hours ------------------------------------------------------------------------------------------------------------------ No results found for this basename: VITAMINB12:2,FOLATE:2,FERRITIN:2,TIBC:2,IRON:2,RETICCTPCT:2 in the last 72 hours  Coagulation profile No results found for this basename: INR:5,PROTIME:5 in the last 168 hours  No results found for this basename: DDIMER:2 in the last 72 hours  Cardiac Enzymes No results found for this basename: CK:3,CKMB:3,TROPONINI:3,MYOGLOBIN:3 in the last 168 hours ------------------------------------------------------------------------------------------------------------------ No components found with this  basename: POCBNP:3
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
## 2012-07-28 ENCOUNTER — Inpatient Hospital Stay (HOSPITAL_COMMUNITY): Payer: Medicaid Other

## 2012-07-28 DIAGNOSIS — R112 Nausea with vomiting, unspecified: Secondary | ICD-10-CM

## 2012-07-28 DIAGNOSIS — J988 Other specified respiratory disorders: Secondary | ICD-10-CM

## 2012-07-28 LAB — BASIC METABOLIC PANEL
BUN: <3 mg/dL — ABNORMAL LOW (ref 6–23)
CO2: 28 mEq/L (ref 19–32)
Chloride: 101 mEq/L (ref 96–112)
Creatinine, Ser: 0.81 mg/dL (ref 0.50–1.10)
GFR calc Af Amer: >90 mL/min (ref >90)
Glucose, Bld: 78 mg/dL (ref 70–99)
Potassium: 3.4 mEq/L — ABNORMAL LOW (ref 3.5–5.1)

## 2012-07-28 LAB — CBC WITH DIFFERENTIAL/PLATELET
Basophils Relative: 0 % (ref 0–1)
HCT: 32.3 % — ABNORMAL LOW (ref 36.0–46.0)
Hemoglobin: 11.5 g/dL — ABNORMAL LOW (ref 12.0–15.0)
Lymphs Abs: 0.2 10*3/uL — ABNORMAL LOW (ref 0.7–4.0)
MCHC: 35.6 g/dL (ref 30.0–36.0)
Monocytes Absolute: 0.8 10*3/uL (ref 0.1–1.0)
Monocytes Relative: 32 % — ABNORMAL HIGH (ref 3–12)
Neutro Abs: 1.1 10*3/uL — ABNORMAL LOW (ref 1.7–7.7)
RBC: 3.89 MIL/uL (ref 3.87–5.11)

## 2012-07-28 LAB — GLUCOSE, CAPILLARY
Glucose-Capillary: 125 mg/dL — ABNORMAL HIGH (ref 70–99)
Glucose-Capillary: 85 mg/dL (ref 70–99)

## 2012-07-28 LAB — STREP PNEUMONIAE URINARY ANTIGEN: Strep Pneumo Urinary Antigen: NEGATIVE

## 2012-07-28 MED ORDER — POTASSIUM CHLORIDE IN NACL 20-0.9 MEQ/L-% IV SOLN
INTRAVENOUS | Status: AC
  Administered 2012-07-28: 09:00:00 via INTRAVENOUS
  Filled 2012-07-28 (×2): qty 1000

## 2012-07-28 MED ORDER — POTASSIUM CHLORIDE CRYS ER 20 MEQ PO TBCR
40.0000 meq | EXTENDED_RELEASE_TABLET | Freq: Once | ORAL | Status: AC
  Administered 2012-07-28: 40 meq via ORAL
  Filled 2012-07-28: qty 2
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
## 2012-07-28 NOTE — Progress Notes (Signed)
[facility]    Subjective: Still not feeling well, some nausea since septra added, still with DOE   Antibiotics:  Anti-infectives     Start     Dose/Rate Route Frequency Ordered Stop   07/27/12 1400  sulfamethoxazole-trimethoprim (BACTRIM) 450 mg in dextrose 5 % 500 mL IVPB       450 mg 352.1 mL/hr over 90 Minutes Intravenous 3 times per day 07/27/12 1321     07/25/12 0900   levofloxacin (LEVAQUIN) IVPB 750 mg        750 mg 100 mL/hr over 90 Minutes Intravenous Daily 07/25/12 0802     07/25/12 0900   meropenem (MERREM) 1 g in sodium chloride 0.9 % 100 mL IVPB  Status:  Discontinued        1 g 200 mL/hr over 30 Minutes Intravenous Every 8 hours 07/25/12 0802 07/27/12 1300   07/25/12 0600   vancomycin (VANCOCIN) IVPB 1000 mg/200 mL premix  Status:  Discontinued        1,000 mg 200 mL/hr over 60 Minutes Intravenous Every 8 hours 07/25/12 0534 07/27/12 1300   07/25/12 0515   cefTRIAXone (ROCEPHIN) 1 g in dextrose 5 % 50 mL IVPB  Status:  Discontinued        1 g 100 mL/hr over 30 Minutes Intravenous Every 24 hours 07/25/12 0503 07/25/12 0713   07/25/12 0515   azithromycin (ZITHROMAX) 500 mg in dextrose 5 % 250 mL IVPB  Status:  Discontinued        500 mg 250 mL/hr over 60 Minutes Intravenous Every 24 hours 07/25/12 0503 07/25/12 0810   07/25/12 0400   cefTRIAXone (ROCEPHIN) 1 g in dextrose 5 % 50 mL IVPB        1 g 100 mL/hr over 30 Minutes Intravenous  Once 07/25/12 0347 07/25/12 0709   07/25/12 0400   azithromycin (ZITHROMAX) tablet 500 mg  Status:  Discontinued        500 mg Oral  Once 07/25/12 0347 07/25/12 0713          Medications: Scheduled Meds:   . amLODipine  10 mg Oral Daily  . enoxaparin (LOVENOX) injection  40 mg Subcutaneous Q24H  . insulin aspart  0-15 Units Subcutaneous TID WC  . insulin aspart  0-5 Units Subcutaneous QHS  . levofloxacin (LEVAQUIN) IV  750 mg Intravenous Daily  . lidocaine  1 patch Transdermal Q24H  .  metoprolol tartrate  25 mg Oral BID  . potassium chloride  40 mEq Oral Once  . sucralfate  1 g Oral TID AC & HS  . sulfamethoxazole-trimethoprim  450 mg Intravenous Q8H   Continuous Infusions:   . sodium chloride 75 mL/hr at 07/28/12 0149  . 0.9 % NaCl with KCl 20 mEq / L 50 mL/hr at 07/28/12 0900   PRN Meds:.acetaminophen, alum & mag hydroxide-simeth, baclofen, guaiFENesin-dextromethorphan, HYDROcodone-acetaminophen, hydrocortisone cream, LORazepam, morphine injection, ondansetron (ZOFRAN) IV, ondansetron, sodium chloride, zolpidem   Objective: Weight change:   Intake/Output Summary (Last 24 hours) at 07/28/12 2010 Last data filed at 07/28/12 0556  Gross per 24 hour  Intake 1056.26 ml  Output      0 ml  Net 1056.26 ml   Blood pressure 98/64, pulse 71, temperature 98.2 F (36.8 C), temperature source Oral, resp. rate 18, height 5\' 7"  (1.702 m), weight 199 lb 11.8 oz (90.6 kg), SpO2 98.00%. Temp:  [98.2 F (36.8 C)-100 F (37.8 C)] 98.2 F (36.8 C) (08/31 1345) Pulse Rate:  [  71-96] 71  (08/31 1345) Resp:  [18] 18  (08/31 1345) BP: (98-104)/(64-68) 98/64 mmHg (08/31 1345) SpO2:  [96 %-98 %] 98 % (08/31 1345)  Physical Exam: General appearance: alert, cooperative and no distress  Resp: clear to auscultation bilaterally  Cardio: regular rate and rhythm, S1, S2 normal, no murmur, click, rub or gallop  GI: soft, non-tender; bowel sounds normal; no masses, no organomegaly  Extremities: extremities normal, atraumatic, no cyanosis or edema  Skin: portacath site clean     Lab Results:  Basename 07/28/12 0405 07/27/12 0526  WBC 2.5* 2.9*  HGB 11.5* 11.8*  HCT 32.3* 33.3*  PLT 183 164    BMET  Basename 07/28/12 0405 07/27/12 0526  NA 137 136  K 3.4* 3.4*  CL 101 100  CO2 28 27  GLUCOSE 78 103*  BUN <3* <3*  CREATININE 0.81 0.66  CALCIUM 8.5 8.7    Micro Results: Recent Results (from the past 240 hour(s))  URINE CULTURE     Status: Normal   Collection Time    07/24/12 10:59 PM      Component Value Range Status Comment   Specimen Description URINE, CLEAN CATCH   Final    Special Requests Immunocompromised   Final    Culture  Setup Time 07/25/2012 05:50   Final    Colony Count >=100,000 COLONIES/ML   Final    Culture     Final    Value: Multiple bacterial morphotypes present, none predominant. Suggest appropriate recollection if clinically indicated.   Report Status 07/26/2012 FINAL   Final   CULTURE, BLOOD (ROUTINE X 2)     Status: Normal (Preliminary result)   Collection Time   07/24/12 11:10 PM      Component Value Range Status Comment   Specimen Description BLOOD RIGHT ANTECUBITAL   Final    Special Requests BOTTLES DRAWN AEROBIC AND ANAEROBIC 4CC   Final    Culture  Setup Time 07/25/2012 04:36   Final    Culture     Final    Value:        BLOOD CULTURE RECEIVED NO GROWTH TO DATE CULTURE WILL BE HELD FOR 5 DAYS BEFORE ISSUING A FINAL NEGATIVE REPORT   Report Status PENDING   Incomplete   CULTURE, BLOOD (ROUTINE X 2)     Status: Normal (Preliminary result)   Collection Time   07/24/12 11:20 PM      Component Value Range Status Comment   Specimen Description BLOOD NO SITE INDICATED   Final    Special Requests     Final    Value: BOTTLES DRAWN AEROBIC AND ANAEROBIC NO VOLUME INDICATED   Culture  Setup Time 07/25/2012 04:36   Final    Culture     Final    Value:        BLOOD CULTURE RECEIVED NO GROWTH TO DATE CULTURE WILL BE HELD FOR 5 DAYS BEFORE ISSUING A FINAL NEGATIVE REPORT   Report Status PENDING   Incomplete   CULTURE, BLOOD (SINGLE)     Status: Normal (Preliminary result)   Collection Time   07/25/12  6:30 AM      Component Value Range Status Comment   Specimen Description BLOOD PORTA CATH FROM MEDIPORT   Final    Special Requests BOTTLES DRAWN AEROBIC AND ANAEROBIC 10CC EACH   Final    Culture  Setup Time 07/25/2012 11:00   Final    Culture     Final    Value:  BLOOD CULTURE RECEIVED NO GROWTH TO DATE CULTURE WILL BE HELD  FOR 5 DAYS BEFORE ISSUING A FINAL NEGATIVE REPORT   Report Status PENDING   Incomplete   CULTURE, BLOOD (ROUTINE X 2)     Status: Normal (Preliminary result)   Collection Time   07/27/12  8:00 AM      Component Value Range Status Comment   Specimen Description BLOOD RIGHT HAND   Final    Special Requests BOTTLES DRAWN AEROBIC AND ANAEROBIC 7CC   Final    Culture  Setup Time 07/27/2012 12:29   Final    Culture     Final    Value:        BLOOD CULTURE RECEIVED NO GROWTH TO DATE CULTURE WILL BE HELD FOR 5 DAYS BEFORE ISSUING A FINAL NEGATIVE REPORT   Report Status PENDING   Incomplete   CULTURE, BLOOD (ROUTINE X 2)     Status: Normal (Preliminary result)   Collection Time   07/27/12 11:05 AM      Component Value Range Status Comment   Specimen Description BLOOD LEFT HAND   Final    Special Requests BOTTLES DRAWN AEROBIC AND ANAEROBIC [facility]   Final    Culture  Setup Time 07/27/2012 15:02   Final    Culture     Final    Value:        BLOOD CULTURE RECEIVED NO GROWTH TO DATE CULTURE WILL BE HELD FOR 5 DAYS BEFORE ISSUING A FINAL NEGATIVE REPORT   Report Status PENDING   Incomplete     Studies/Results: Dg Chest 2 View  07/28/2012  *RADIOLOGY REPORT*  Clinical Data: Follow up pneumonia.  History of Hodgkin's lymphoma.  CHEST - 2 VIEW  Comparison: CT chest yesterday.  Two-view chest x-ray yesterday, 829/2013, 07/24/2012.  PET CT 05/28/2012.  Findings: Patchy airspace opacities in both lungs, predominately the upper lobes, not significantly change since yesterday but worse than the examination 4 days ago.  No new pulmonary parenchymal abnormalities. Cardiomediastinal silhouette unremarkable, unchanged.  Right subclavian Port-A-Cath tip in the SVC. Visualized bony thorax intact.  IMPRESSION: Stable patchy airspace opacities throughout both lungs, particularly the upper lobes, when compared to yesterday, though increased since 4 days ago.   Original Report Authenticated By: Arnell Sieving, M.D.    Dg  Chest 2 View  07/27/2012  *RADIOLOGY REPORT*  Clinical Data: Diabetes.  Hodgkin's lymphoma.  Pneumonia.  CHEST - 2 VIEW  Comparison: 07/26/2012  Findings: Indistinct airspace opacities noted in both lungs with slightly nodular elements, with overall burden greater on the left than the right, suspicious for bilateral pneumonia.  Power port catheter noted with tip projecting over the SVC. Cardiac and mediastinal contours appear unremarkable.  IMPRESSION:  1.  Stable indistinct airspace opacities with nodular elements. Appearance favors infection; given the nodular elements, entities such as fungal disease cannot be excluded.  Lymphomatous infiltration of the lung is considered less likely.   Original Report Authenticated By: Dellia Cloud, M.D.    Ct Chest W Contrast  07/27/2012  *RADIOLOGY REPORT*  Clinical Data: Immunocompromised patient with history of Hodgkin's lymphoma with infiltrates in the lungs and fever.  Cough.  CT CHEST WITH CONTRAST  Technique:  Multidetector CT imaging of the chest was performed following the standard protocol during bolus administration of intravenous contrast.  Contrast: 80mL OMNIPAQUE IOHEXOL 300 MG/ML  SOLN  Comparison: Chest x-ray 07/27/2012.  PET CT 05/28/2012.  Findings:  Mediastinum: Heart size is borderline enlarged. There is no  significant pericardial fluid, thickening or pericardial calcification.  Again noted is some amorphous soft tissue in the anterior mediastinum, presumably lymph nodes, with the largest measuring up to 1.2 cm in short axis, similar to PET 05/28/2012. No other definite pathologically enlarged mediastinal or hilar lymph nodes are noted.  Esophagus is unremarkable in appearance. Right subclavian single lumen Port-A-Cath with tip terminating in the distal superior vena cava.  Lungs/Pleura: Compared to the prior examination the previously noted bilateral lower lobe opacities have largely resolved, with predominately interstitial opacities remaining  in their wake.  In the left lower lobe in particular there are areas of peripheral reticulation, with sparing of the immediate sub pleural lung; a nonspecific appearance that could suggest post infectious cryptogenic organizing pneumonia (COP).  In the mid and upper lungs there is new multifocal patchy nodular and mass-like ground-glass attenuation air space disease with some associated scattered septal thickening and diffuse bronchial wall thickening.  The overall appearance favors an atypical infection.  No pleural effusions.  Upper Abdomen: Focal perfusion anomaly in the liver adjacent to the falciform ligament (a common and benign finding) is incidentally noted.  Musculoskeletal: Left axillary adenopathy and soft tissue stranding is similar to recent PET examination, with the largest lymph node measuring up to 11 mm in short axis. There are no aggressive appearing lytic or blastic lesions noted in the visualized portions of the skeleton.  IMPRESSION: 1.  Markedly worsened patchy multifocal ground-glass attenuation air space disease and interstitial prominence in the mid and upper lungs bilaterally has an appearance most compatible with an atypical infection.  Differential considerations would include fungal etiologies, mycobacterial infection and other atypical organisms. 2.  The peripheral opacities in the lower lobes of the lungs noted on the prior examination have largely resolved, and there is now an appearance of the peripheral reticulation (particularly in the left lower lobe) with the sparing of the immediate sub pleural lung, which is nonspecific but may represent a manifestation of post infectious cryptogenic organizing pneumonia (COP). 3.  Additional incidental findings, as above, similar to prior examinations.   Original Report Authenticated By: Florencia Reasons, M.D.       Assessment/Plan: [name] is a 31 y.o. female with  Stage II B hodgkins lymphoma with fevers and ground glass  opacities in the lungs. She had been on vancomycin merrem and levaquin from 28th onwards withou much improvement, TMP/SMX was added yesterday and vancomycin and merrem stopped.  1) Atypical Pneumonia in immune compromised pt; --agree with current therapy of levaquin and bactrim Which will cover pneumococcus,strep species a FQ pseudomonas, legionella, mycoplasma and other "atypicals" (Levaquin) as well as PCP, nocardia and MRSA (with bactrim)  --azithromycin would not add anything to levaquin that I can discern --certainly if she fails to improve may need to pursue more aggressive diagnostics with bronchoscopy and BAL for afb,fungal and bacterial cultures as well as nocardia And also consideration of adding voriconazole --I will send a serum galactomannan and urine histoplasma ag   LOS: 4 days   Acey Lav 07/28/2012, 8:10 PM

## 2012-07-28 NOTE — Progress Notes (Signed)
Appreciate Pulmonary & ID input Temp trend down now on Levaquin and IV Septra. CT reviewed - progression of bilateral, interstitial infiltrates mostly upper lobes. She has a persistent hacking cough Now nausea, vomiting on antibiotics. WBC 2,500 44 poly 32 lymphs 24 monos 14 eos Exam: Lungs remain clear. No oropharyngeal exudate. Not Toxic Impression: Opportunistic pulmonary infection in immunocompromised Hodgkin's Lymphoma patient who just completed chemo/RT I would vote to add Azithromycin and consider an antifungal at this time pending further culture results.. Agree w bronchoscopy or OLB if no improvement.

## 2012-07-28 NOTE — Progress Notes (Signed)
[facility]                                                                                Patient Demographics  [name], is a 31 y.o. female  ZOX:[id number]  [id number]  DOB - 26-Jun-1981  Admit date - 07/24/2012  Admitting Physician Gery Pray, MD  Outpatient Primary MD for the patient is Dorrene German, MD  LOS - 4   Chief Complaint  Patient presents with  . Fever        Assessment & Plan   1. Fevers with possible pneumonia in an immunocompromised patient who has history of Hodgkin's lymphoma and currently undergoing chemotherapy- blood cultures have been obtained negative so far, patient was initially placed on appropriate IV antibiotics including vancomycin meropenem and Levaquin started on 07/24/2012(PNA in a Immunocompromised pt),  discussed with oncology bedside  on 07/27/2012, according to Dr. Cyndie Chime. patient has had interstitial infiltrates on previous CTs also, with persistent fevers despite IV antibiotics suspicion for atypical versus fungal infection remains high, now infectious disease following along with pulmonary to consider Bronch for a good diagnostic specimen. ABX now switched to Levaquin(07-24-12) and Bactrim (12-1306) only (atypical/PCP). Continue gentle IV fluids, when necessary nebulizer and oxygen treatment as needed.    2. History of diabetes mellitus type 2- Glucophage will be held while she is in the hospital, sliding scale insulin a.c. at bedtime.   CBG (last 3)   Basename 07/26/12 2110 07/26/12 1621 07/26/12 1136  GLUCAP 106* 89 123*      3. History of hypertension, back spasms and eczema - no acute issues home medication lisinopril and hydrochlorothiazide will be held as patient is running fevers and potentially getting IV contrast for CT scan , I would like to avoid any dehydration and acute renal insufficiency, at this time we'll switch her to Norvasc and Lopressor, supportive care with baclofen as needed  for back spasms and steroid cream for left axillary eczema post radiation. MRI L spine is stable.     Code Status: Full  Family Communication: D/W patient and Family 07-25-12  Disposition Plan: Home    Procedures - CXR, CT chest, may get bronch   Consults  - Oncology, infectious disease, pulmonary   Antibiotics  Vanco,Mero(stopped) Levaquin 07-24-12 , Bactrim IV 07-28-12   Anti-infectives     Start     Dose/Rate Route Frequency Ordered Stop   07/27/12 1400  sulfamethoxazole-trimethoprim (BACTRIM) 450 mg in dextrose 5 % 500 mL IVPB       450 mg 352.1 mL/hr over 90 Minutes Intravenous 3 times per day 07/27/12 1321     07/25/12 0900   levofloxacin (LEVAQUIN) IVPB 750 mg        750 mg 100 mL/hr over 90 Minutes Intravenous Daily 07/25/12 0802     07/25/12 0900   meropenem (MERREM) 1 g in sodium chloride 0.9 % 100 mL IVPB  Status:  Discontinued        1 g 200 mL/hr over 30 Minutes Intravenous Every 8 hours 07/25/12 0802 07/27/12 1300   07/25/12 0600   vancomycin (VANCOCIN) IVPB 1000 mg/200 mL premix  Status:  Discontinued  1,000 mg 200 mL/hr over 60 Minutes Intravenous Every 8 hours 07/25/12 0534 07/27/12 1300   07/25/12 0515   cefTRIAXone (ROCEPHIN) 1 g in dextrose 5 % 50 mL IVPB  Status:  Discontinued        1 g 100 mL/hr over 30 Minutes Intravenous Every 24 hours 07/25/12 0503 07/25/12 0713   07/25/12 0515   azithromycin (ZITHROMAX) 500 mg in dextrose 5 % 250 mL IVPB  Status:  Discontinued        500 mg 250 mL/hr over 60 Minutes Intravenous Every 24 hours 07/25/12 0503 07/25/12 0810   07/25/12 0400   cefTRIAXone (ROCEPHIN) 1 g in dextrose 5 % 50 mL IVPB        1 g 100 mL/hr over 30 Minutes Intravenous  Once 07/25/12 0347 07/25/12 0709   07/25/12 0400   azithromycin (ZITHROMAX) tablet 500 mg  Status:  Discontinued        500 mg Oral  Once 07/25/12 0347 07/25/12 0713          Time Spent in minutes   35   DVT Prophylaxis  Lovenox    Lab Results    Component Value Date   PLT 183 07/28/2012     Scheduled Meds:    . alteplase  2 mg Intracatheter Once  . amLODipine  10 mg Oral Daily  . enoxaparin (LOVENOX) injection  40 mg Subcutaneous Q24H  . insulin aspart  0-15 Units Subcutaneous TID WC  . insulin aspart  0-5 Units Subcutaneous QHS  . levofloxacin (LEVAQUIN) IV  750 mg Intravenous Daily  . lidocaine  1 patch Transdermal Q24H  . metoprolol tartrate  25 mg Oral BID  . potassium chloride  40 mEq Oral Once  . sucralfate  1 g Oral TID AC & HS  . sulfamethoxazole-trimethoprim  450 mg Intravenous Q8H  . DISCONTD: meropenem (MERREM) IV  1 g Intravenous Q8H  . DISCONTD: vancomycin  1,000 mg Intravenous Q8H   Continuous Infusions:    . sodium chloride 75 mL/hr at 07/28/12 0149  . 0.9 % NaCl with KCl 20 mEq / L     PRN Meds:.acetaminophen, alum & mag hydroxide-simeth, baclofen, guaiFENesin-dextromethorphan, HYDROcodone-acetaminophen, hydrocortisone cream, iohexol, LORazepam, morphine injection, ondansetron (ZOFRAN) IV, ondansetron, sodium chloride, zolpidem  Susa Raring K M.D on 07/28/2012 at 8:30 AM  Between 7am to 7pm - Pager - [phone]  After 7pm go to [email or web address] - password [email or web address]  And look for the night coverage person covering for me after hours  [facility] Office  [phone]    Subjective:   [name] today has, No headache, No chest pain, No abdominal pain - No Nausea, No new weakness tingling or numbness, No Cough - SOB.  Dull low-back pain which is nonradiating.  Objective:   Filed Vitals:   07/27/12 0532 07/27/12 1446 07/27/12 2055 07/28/12 0542  BP: 130/77 136/81 104/66 104/68  Pulse: 82 90 96 73  Temp: 99.1 F (37.3 C) 99.9 F (37.7 C) 100 F (37.8 C) 99 F (37.2 C)  TempSrc: Oral Oral Axillary Oral  Resp: 18 18 18 18   Height:      Weight:      SpO2: 99% 99% 98% 96%    Wt Readings from Last 3 Encounters:  07/25/12 90.6 kg (199 lb 11.8 oz)  07/16/12 90.538 kg (199  lb 9.6 oz)  07/04/12 95.89 kg (211 lb 6.4 oz)     Intake/Output Summary (Last 24 hours) at 07/28/12 0830 Last data filed at 07/28/12  1610  Gross per 24 hour  Intake 2834.39 ml  Output      0 ml  Net 2834.39 ml    Exam Awake Alert, Oriented X 3, No new F.N deficits, Normal affect [place].AT,PERRAL Supple Neck,No JVD, No cervical lymphadenopathy appriciated.  Symmetrical Chest wall movement, Good air movement bilaterally, CTAB RRR,No Gallops,Rubs or new Murmurs, No Parasternal Heave +ve B.Sounds, Abd Soft, Non tender, No organomegaly appriciated, No rebound - guarding or rigidity. No Cyanosis, Clubbing or edema, No new Rash or bruise , L axilla skin darkened post radiation   Data Review    Radiology Reports Dg Chest 2 View  07/24/2012  *RADIOLOGY REPORT*  Clinical Data: Fever, cough.  CHEST - 2 VIEW  Comparison: 05/28/2012 PET CT  Findings: Right chest wall Port-A-Cath tip projects over the proximal SVC.  Mild peripheral reticular nodular opacities.  No pleural effusion or pneumothorax.  No acute osseous finding. Cardiomediastinal contours within normal range.  IMPRESSION: Mild peripheral reticular nodular opacities may correspond to the nodules described on recent PET CT. The differential includes infection or metastatic disease.   Original Report Authenticated By: Waneta Martins, M.D.    Mr Lumbar Spine W Wo Contrast  07/25/2012  *RADIOLOGY REPORT*  Clinical Data: Low back pain, fever, and history of Hodgkin's lymphoma.  MRI LUMBAR SPINE WITHOUT AND WITH CONTRAST  Technique:  Multiplanar and multiecho pulse sequences of the lumbar spine were obtained without and with intravenous contrast.  Contrast: 18mL MULTIHANCE GADOBENATE DIMEGLUMINE 529 MG/ML IV SOLN  Comparison: None.  Findings: Normal conus tip is at L2.  Normal paraspinal soft tissues.  The discs from T11-12 through L5-S1 are normal.  There is no facet arthritis, spinal stenosis, spondylolisthesis, or other abnormality.  There  is no pathologic enhancement after contrast administration.  IMPRESSION: Normal MRI of the lumbar spine.   Original Report Authenticated By: Gwynn Burly, M.D.     CBC  Lab 07/28/12 0405 07/27/12 0526 07/26/12 0550 07/25/12 0642 07/24/12 2310  WBC 2.5* 2.9* 3.2* 2.8* 3.6*  HGB 11.5* 11.8* 11.9* 11.5* 12.9  HCT 32.3* 33.3* 33.8* 32.7* 36.1  PLT 183 164 173 165 179  MCV 83.0 83.0 83.5 85.4 85.5  MCH 29.6 29.4 29.4 30.0 30.6  MCHC 35.6 35.4 35.2 35.2 35.7  RDW 12.2 12.1 12.0 12.2 12.4  LYMPHSABS 0.2* 0.7 0.6* -- 0.5*  MONOABS 0.8 0.3 0.5 -- 0.7  EOSABS 0.4 0.3 0.3 -- 0.3  BASOSABS 0.0 0.0 0.0 -- 0.0  BANDABS -- -- -- -- --    Chemistries   Lab 07/28/12 0405 07/27/12 0526 07/26/12 0550 07/25/12 0642 07/24/12 2310  NA 137 136 132* -- 132*  K 3.4* 3.4* 3.5 -- 3.7  CL 101 100 98 -- 95*  CO2 28 27 23  -- 20  GLUCOSE 78 103* 117* -- 76  BUN <3* <3* 3* -- 9  CREATININE 0.81 0.66 0.67 0.68 0.76  CALCIUM 8.5 8.7 8.5 -- 9.3  MG -- -- -- -- --  AST -- -- -- -- 17  ALT -- -- -- -- 21  ALKPHOS -- -- -- -- 74  BILITOT -- -- -- -- 0.4   ------------------------------------------------------------------------------------------------------------------ estimated creatinine clearance is 119.5 ml/min (by C-G formula based on Cr of 0.81). ------------------------------------------------------------------------------------------------------------------ No results found for this basename: HGBA1C:2 in the last 72 hours ------------------------------------------------------------------------------------------------------------------ No results found for this basename: CHOL:2,HDL:2,LDLCALC:2,TRIG:2,CHOLHDL:2,LDLDIRECT:2 in the last 72 hours ------------------------------------------------------------------------------------------------------------------ No results found for this basename: TSH,T4TOTAL,FREET3,T3FREE,THYROIDAB in the last 72  hours ------------------------------------------------------------------------------------------------------------------ No  results found for this basename: VITAMINB12:2,FOLATE:2,FERRITIN:2,TIBC:2,IRON:2,RETICCTPCT:2 in the last 72 hours  Coagulation profile No results found for this basename: INR:5,PROTIME:5 in the last 168 hours  No results found for this basename: DDIMER:2 in the last 72 hours  Cardiac Enzymes No results found for this basename: CK:3,CKMB:3,TROPONINI:3,MYOGLOBIN:3 in the last 168 hours ------------------------------------------------------------------------------------------------------------------ No components found with this basename: POCBNP:3

## 2012-07-29 LAB — CBC WITH DIFFERENTIAL/PLATELET
Eosinophils Absolute: 0.3 10*3/uL (ref 0.0–0.7)
Eosinophils Relative: 13 % — ABNORMAL HIGH (ref 0–5)
HCT: 31.6 % — ABNORMAL LOW (ref 36.0–46.0)
Hemoglobin: 11.3 g/dL — ABNORMAL LOW (ref 12.0–15.0)
Lymphocytes Relative: 16 % (ref 12–46)
Lymphs Abs: 0.4 10*3/uL — ABNORMAL LOW (ref 0.7–4.0)
MCH: 29.9 pg (ref 26.0–34.0)
MCV: 83.6 fL (ref 78.0–100.0)
Monocytes Absolute: 0.6 10*3/uL (ref 0.1–1.0)
Monocytes Relative: 24 % — ABNORMAL HIGH (ref 3–12)
RBC: 3.78 MIL/uL — ABNORMAL LOW (ref 3.87–5.11)
WBC: 2.3 10*3/uL — ABNORMAL LOW (ref 4.0–10.5)

## 2012-07-29 LAB — ANA: Anti Nuclear Antibody(ANA): NEGATIVE

## 2012-07-29 LAB — BASIC METABOLIC PANEL
BUN: <3 mg/dL — ABNORMAL LOW (ref 6–23)
CO2: 26 mEq/L (ref 19–32)
Calcium: 8.7 mg/dL (ref 8.4–10.5)
GFR calc non Af Amer: >90 mL/min (ref >90)
Glucose, Bld: 77 mg/dL (ref 70–99)
Sodium: 136 mEq/L (ref 135–145)

## 2012-07-29 LAB — GLUCOSE, CAPILLARY: Glucose-Capillary: 100 mg/dL — ABNORMAL HIGH (ref 70–99)

## 2012-07-29 LAB — LEGIONELLA ANTIGEN, URINE

## 2012-07-29 NOTE — Progress Notes (Signed)
[facility]    Subjective: Fever overnight, otherwise not much changed ssx wise   Antibiotics:  Anti-infectives     Start     Dose/Rate Route Frequency Ordered Stop   07/27/12 1400  sulfamethoxazole-trimethoprim (BACTRIM) 450 mg in dextrose 5 % 500 mL IVPB       450 mg 352.1 mL/hr over 90 Minutes Intravenous 3 times per day 07/27/12 1321     07/25/12 0900   levofloxacin (LEVAQUIN) IVPB 750 mg        750 mg 100 mL/hr over 90 Minutes Intravenous Daily 07/25/12 0802     07/25/12 0900   meropenem (MERREM) 1 g in sodium chloride 0.9 % 100 mL IVPB  Status:  Discontinued        1 g 200 mL/hr over 30 Minutes Intravenous Every 8 hours 07/25/12 0802 07/27/12 1300   07/25/12 0600   vancomycin (VANCOCIN) IVPB 1000 mg/200 mL premix  Status:  Discontinued        1,000 mg 200 mL/hr over 60 Minutes Intravenous Every 8 hours 07/25/12 0534 07/27/12 1300   07/25/12 0515   cefTRIAXone (ROCEPHIN) 1 g in dextrose 5 % 50 mL IVPB  Status:  Discontinued        1 g 100 mL/hr over 30 Minutes Intravenous Every 24 hours 07/25/12 0503 07/25/12 0713   07/25/12 0515   azithromycin (ZITHROMAX) 500 mg in dextrose 5 % 250 mL IVPB  Status:  Discontinued        500 mg 250 mL/hr over 60 Minutes Intravenous Every 24 hours 07/25/12 0503 07/25/12 0810   07/25/12 0400   cefTRIAXone (ROCEPHIN) 1 g in dextrose 5 % 50 mL IVPB        1 g 100 mL/hr over 30 Minutes Intravenous  Once 07/25/12 0347 07/25/12 0709   07/25/12 0400   azithromycin (ZITHROMAX) tablet 500 mg  Status:  Discontinued        500 mg Oral  Once 07/25/12 0347 07/25/12 0713          Medications: Scheduled Meds:    . amLODipine  10 mg Oral Daily  . enoxaparin (LOVENOX) injection  40 mg Subcutaneous Q24H  . insulin aspart  0-15 Units Subcutaneous TID WC  . insulin aspart  0-5 Units Subcutaneous QHS  . levofloxacin (LEVAQUIN) IV  750 mg Intravenous Daily  . lidocaine  1 patch Transdermal Q24H  . metoprolol  tartrate  25 mg Oral BID  . sucralfate  1 g Oral TID AC & HS  . sulfamethoxazole-trimethoprim  450 mg Intravenous Q8H   Continuous Infusions:    . 0.9 % NaCl with KCl 20 mEq / L 50 mL/hr at 07/28/12 0900   PRN Meds:.acetaminophen, alum & mag hydroxide-simeth, baclofen, guaiFENesin-dextromethorphan, HYDROcodone-acetaminophen, hydrocortisone cream, LORazepam, morphine injection, ondansetron (ZOFRAN) IV, ondansetron, sodium chloride, zolpidem   Objective: Weight change:  No intake or output data in the 24 hours ending 07/29/12 1143 Blood pressure 105/73, pulse 78, temperature 98.6 F (37 C), temperature source Oral, resp. rate 18, height 5\' 7"  (1.702 m), weight 199 lb 11.8 oz (90.6 kg), SpO2 95.00%. Temp:  [98.2 F (36.8 C)-101.5 F (38.6 C)] 98.6 F (37 C) (09/01 0723) Pulse Rate:  [70-92] 78  (09/01 1005) Resp:  [18] 18  (09/01 0723) BP: (98-119)/(64-73) 105/73 mmHg (09/01 1005) SpO2:  [95 %-98 %] 95 % (09/01 0723)  Physical Exam: General appearance: alert, cooperative and no distress  Resp: clear to auscultation bilaterally  Cardio: regular rate and rhythm, S1, S2 normal, no murmur, click, rub or gallop  GI: soft, non-tender; bowel sounds normal; no masses, no organomegaly  Extremities: extremities normal, atraumatic, no cyanosis or edema  Skin: portacath site clean     Lab Results:  Basename 07/29/12 0430 07/28/12 0405  WBC 2.3* 2.5*  HGB 11.3* 11.5*  HCT 31.6* 32.3*  PLT 176 183    BMET  Basename 07/29/12 0430 07/28/12 0405  NA 136 137  K 3.7 3.4*  CL 101 101  CO2 26 28  GLUCOSE 77 78  BUN <3* <3*  CREATININE 0.85 0.81  CALCIUM 8.7 8.5    Micro Results: Recent Results (from the past 240 hour(s))  URINE CULTURE     Status: Normal   Collection Time   07/24/12 10:59 PM      Component Value Range Status Comment   Specimen Description URINE, CLEAN CATCH   Final    Special Requests Immunocompromised   Final    Culture  Setup Time 07/25/2012 05:50   Final     Colony Count >=100,000 COLONIES/ML   Final    Culture     Final    Value: Multiple bacterial morphotypes present, none predominant. Suggest appropriate recollection if clinically indicated.   Report Status 07/26/2012 FINAL   Final   CULTURE, BLOOD (ROUTINE X 2)     Status: Normal (Preliminary result)   Collection Time   07/24/12 11:10 PM      Component Value Range Status Comment   Specimen Description BLOOD RIGHT ANTECUBITAL   Final    Special Requests BOTTLES DRAWN AEROBIC AND ANAEROBIC 4CC   Final    Culture  Setup Time 07/25/2012 04:36   Final    Culture     Final    Value:        BLOOD CULTURE RECEIVED NO GROWTH TO DATE CULTURE WILL BE HELD FOR 5 DAYS BEFORE ISSUING A FINAL NEGATIVE REPORT   Report Status PENDING   Incomplete   CULTURE, BLOOD (ROUTINE X 2)     Status: Normal (Preliminary result)   Collection Time   07/24/12 11:20 PM      Component Value Range Status Comment   Specimen Description BLOOD NO SITE INDICATED   Final    Special Requests     Final    Value: BOTTLES DRAWN AEROBIC AND ANAEROBIC NO VOLUME INDICATED   Culture  Setup Time 07/25/2012 04:36   Final    Culture     Final    Value:        BLOOD CULTURE RECEIVED NO GROWTH TO DATE CULTURE WILL BE HELD FOR 5 DAYS BEFORE ISSUING A FINAL NEGATIVE REPORT   Report Status PENDING   Incomplete   CULTURE, BLOOD (SINGLE)     Status: Normal (Preliminary result)   Collection Time   07/25/12  6:30 AM      Component Value Range Status Comment   Specimen Description BLOOD PORTA CATH FROM MEDIPORT   Final    Special Requests BOTTLES DRAWN AEROBIC AND ANAEROBIC 10CC EACH   Final    Culture  Setup Time 07/25/2012 11:00   Final    Culture     Final    Value:        BLOOD CULTURE RECEIVED NO GROWTH TO DATE CULTURE WILL BE HELD FOR 5 DAYS BEFORE ISSUING A FINAL NEGATIVE REPORT   Report Status PENDING   Incomplete   CULTURE, BLOOD (ROUTINE X 2)  Status: Normal (Preliminary result)   Collection Time   07/27/12  8:00 AM       Component Value Range Status Comment   Specimen Description BLOOD RIGHT HAND   Final    Special Requests BOTTLES DRAWN AEROBIC AND ANAEROBIC 7CC   Final    Culture  Setup Time 07/27/2012 12:29   Final    Culture     Final    Value:        BLOOD CULTURE RECEIVED NO GROWTH TO DATE CULTURE WILL BE HELD FOR 5 DAYS BEFORE ISSUING A FINAL NEGATIVE REPORT   Report Status PENDING   Incomplete   CULTURE, BLOOD (ROUTINE X 2)     Status: Normal (Preliminary result)   Collection Time   07/27/12 11:05 AM      Component Value Range Status Comment   Specimen Description BLOOD LEFT HAND   Final    Special Requests BOTTLES DRAWN AEROBIC AND ANAEROBIC [facility]   Final    Culture  Setup Time 07/27/2012 15:02   Final    Culture     Final    Value:        BLOOD CULTURE RECEIVED NO GROWTH TO DATE CULTURE WILL BE HELD FOR 5 DAYS BEFORE ISSUING A FINAL NEGATIVE REPORT   Report Status PENDING   Incomplete     Studies/Results: Dg Chest 2 View  07/28/2012  *RADIOLOGY REPORT*  Clinical Data: Follow up pneumonia.  History of Hodgkin's lymphoma.  CHEST - 2 VIEW  Comparison: CT chest yesterday.  Two-view chest x-ray yesterday, 829/2013, 07/24/2012.  PET CT 05/28/2012.  Findings: Patchy airspace opacities in both lungs, predominately the upper lobes, not significantly change since yesterday but worse than the examination 4 days ago.  No new pulmonary parenchymal abnormalities. Cardiomediastinal silhouette unremarkable, unchanged.  Right subclavian Port-A-Cath tip in the SVC. Visualized bony thorax intact.  IMPRESSION: Stable patchy airspace opacities throughout both lungs, particularly the upper lobes, when compared to yesterday, though increased since 4 days ago.   Original Report Authenticated By: Arnell Sieving, M.D.       Assessment/Plan: [name] is a 31 y.o. female with  Stage II B hodgkins lymphoma with fevers and ground glass opacities in the lungs. She had been on vancomycin merrem and levaquin from  28th onwards withou much improvement, TMP/SMX was added yesterday and vancomycin and merrem stopped.  1) Atypical Pneumonia in immune compromised pt; -- I THINK SHE NEEDS A BRONCHOSCOPY WITH BAL FOR FUNGAL, AFB AND BACTERIAL INCLUDING NOCARDIA CULTURES, VIRAL CULTURES, VIRAL PCR PANEL --CONTINUE BACTRIM AND LEVAQUIN IN MEANTIME --WOULD NOT ADD VORICONAZOLE UNTIL AFTER BRONCHOSCOPY  --I will send a serum galactomannan and urine histoplasma ag   LOS: 5 days   Acey Lav 07/29/2012, 11:43 AM

## 2012-07-29 NOTE — Progress Notes (Signed)
I appreciate ID input T max 101.5 on Levaquin d#5   Septra IV d # 3 Routine cultures all negative to date Legionella antigen still not available Persistent hacking cough especially if she gets up or takes a deep breath. 2 episodes of transient dyspnea yesterday - 1 after a coughing spell; one spontaneous O2 saturation >/= 95% on room air WBC slowly falling now 2,300 ANC 1.1 24-32% monocytes - soft sign of fungal infection or TB Exam: Lungs remain clear to auscultation No leg edema, no calf tenderness Impression: Pulmonary infiltrates/cough/fever in Hodgkin's patient Clinically stable but still symptomatic and hectic temp curve Will continue current regimen but I would have a very low threshold for adding an anti-fungal at this time. Pulmonary on board and will do bronch if necessary.

## 2012-07-29 NOTE — Progress Notes (Signed)
[facility]                                                                                Patient Demographics  [name], is a 31 y.o. female  WUJ:[id number]  [id number]  DOB - Apr 22, 1981  Admit date - 07/24/2012  Admitting Physician Gery Pray, MD  Outpatient Primary MD for the patient is Dorrene German, MD  LOS - 5   Chief Complaint  Patient presents with  . Fever        Assessment & Plan   1. Fevers with possible pneumonia in an immunocompromised patient who has history of Hodgkin's lymphoma and currently undergoing chemotherapy- blood cultures have been obtained negative so far.   Patient has history of interstitial infiltrates on CT scan for the last few months, she was initially treated for pneumonia in immunocompromised patient with broad-spectrum antibiotics without much benefit, since she continued to spike fevers despite being on broad antibiotics it was thought that her pneumonia was most likely related to atypical organisms, she was then kept on IV Bactrim and Levaquin, ID and pulmonary have been consulted both are following the patient. Pulmonary is considering Bronch to get a good specimen for cultures and guided treatment. She is still spiking fevers and raising the suspicion for possible fungal infection.  Discussed with infectious disease physician Dr. Algis Liming on 07/29/2012 along with Dr. Cyndie Chime her on 07/29/2012.     2. History of diabetes mellitus type 2- Glucophage will be held while she is in the hospital, sliding scale insulin a.c. at bedtime.   CBG (last 3)   Basename 07/29/12 0755 07/28/12 2148 07/28/12 1658  GLUCAP 118* 85 125*       3. History of hypertension, back spasms and eczema - no acute issues home medication lisinopril and hydrochlorothiazide will be held as patient is running fevers and potentially getting IV contrast for CT scan , I would like to avoid any dehydration and acute renal  insufficiency, at this time we'll switch her to Norvasc and Lopressor, supportive care with baclofen as needed for back spasms and steroid cream for left axillary eczema post radiation. MRI L spine is stable.     Code Status: Full  Family Communication: D/W patient and Family 07-25-12  Disposition Plan: Home    Procedures - CXR, CT chest, may get bronch   Consults  - Oncology, infectious disease, pulmonary   Antibiotics  Vanco,Mero(stopped) Levaquin 07-24-12 , Bactrim IV 07-28-12   Anti-infectives     Start     Dose/Rate Route Frequency Ordered Stop   07/27/12 1400  sulfamethoxazole-trimethoprim (BACTRIM) 450 mg in dextrose 5 % 500 mL IVPB       450 mg 352.1 mL/hr over 90 Minutes Intravenous 3 times per day 07/27/12 1321     07/25/12 0900   levofloxacin (LEVAQUIN) IVPB 750 mg        750 mg 100 mL/hr over 90 Minutes Intravenous Daily 07/25/12 0802     07/25/12 0900   meropenem (MERREM) 1 g in sodium chloride 0.9 % 100 mL IVPB  Status:  Discontinued        1 g 200 mL/hr over 30 Minutes Intravenous Every  8 hours 07/25/12 0802 07/27/12 1300   07/25/12 0600   vancomycin (VANCOCIN) IVPB 1000 mg/200 mL premix  Status:  Discontinued        1,000 mg 200 mL/hr over 60 Minutes Intravenous Every 8 hours 07/25/12 0534 07/27/12 1300   07/25/12 0515   cefTRIAXone (ROCEPHIN) 1 g in dextrose 5 % 50 mL IVPB  Status:  Discontinued        1 g 100 mL/hr over 30 Minutes Intravenous Every 24 hours 07/25/12 0503 07/25/12 0713   07/25/12 0515   azithromycin (ZITHROMAX) 500 mg in dextrose 5 % 250 mL IVPB  Status:  Discontinued        500 mg 250 mL/hr over 60 Minutes Intravenous Every 24 hours 07/25/12 0503 07/25/12 0810   07/25/12 0400   cefTRIAXone (ROCEPHIN) 1 g in dextrose 5 % 50 mL IVPB        1 g 100 mL/hr over 30 Minutes Intravenous  Once 07/25/12 0347 07/25/12 0709   07/25/12 0400   azithromycin (ZITHROMAX) tablet 500 mg  Status:  Discontinued        500 mg Oral  Once 07/25/12 0347  07/25/12 0713          Time Spent in minutes   35   DVT Prophylaxis  Lovenox    Lab Results  Component Value Date   PLT 176 07/29/2012     Scheduled Meds:    . amLODipine  10 mg Oral Daily  . enoxaparin (LOVENOX) injection  40 mg Subcutaneous Q24H  . insulin aspart  0-15 Units Subcutaneous TID WC  . insulin aspart  0-5 Units Subcutaneous QHS  . levofloxacin (LEVAQUIN) IV  750 mg Intravenous Daily  . lidocaine  1 patch Transdermal Q24H  . metoprolol tartrate  25 mg Oral BID  . potassium chloride  40 mEq Oral Once  . sucralfate  1 g Oral TID AC & HS  . sulfamethoxazole-trimethoprim  450 mg Intravenous Q8H   Continuous Infusions:    . 0.9 % NaCl with KCl 20 mEq / L 50 mL/hr at 07/28/12 0900   PRN Meds:.acetaminophen, alum & mag hydroxide-simeth, baclofen, guaiFENesin-dextromethorphan, HYDROcodone-acetaminophen, hydrocortisone cream, LORazepam, morphine injection, ondansetron (ZOFRAN) IV, ondansetron, sodium chloride, zolpidem  Susa Raring K M.D on 07/29/2012 at 10:41 AM  Between 7am to 7pm - Pager - [phone]  After 7pm go to [email or web address] - password [email or web address]  And look for the night coverage person covering for me after hours  [facility] Office  [phone]    Subjective:   [name] today has, No headache, No chest pain, No abdominal pain - No Nausea, No new weakness tingling or numbness, No Cough - SOB.  Dull low-back pain which is nonradiating.  Objective:   Filed Vitals:   07/28/12 2227 07/29/12 0723 07/29/12 1000 07/29/12 1005  BP: 119/70 100/67 105/73 105/73  Pulse: 92 70  78  Temp: 101.5 F (38.6 C) 98.6 F (37 C)    TempSrc: Oral Oral    Resp: 18 18    Height:      Weight:      SpO2: 95% 95%      Wt Readings from Last 3 Encounters:  07/25/12 90.6 kg (199 lb 11.8 oz)  07/16/12 90.538 kg (199 lb 9.6 oz)  07/04/12 95.89 kg (211 lb 6.4 oz)    No intake or output data in the 24 hours ending 07/29/12 1041  Exam Awake  Alert, Oriented X 3, No new F.N deficits, Normal affect Nelson.AT,PERRAL  Supple Neck,No JVD, No cervical lymphadenopathy appriciated.  Symmetrical Chest wall movement, Good air movement bilaterally, CTAB RRR,No Gallops,Rubs or new Murmurs, No Parasternal Heave +ve B.Sounds, Abd Soft, Non tender, No organomegaly appriciated, No rebound - guarding or rigidity. No Cyanosis, Clubbing or edema, No new Rash or bruise , L axilla skin darkened post radiation   Data Review    Radiology Reports Dg Chest 2 View  07/24/2012  *RADIOLOGY REPORT*  Clinical Data: Fever, cough.  CHEST - 2 VIEW  Comparison: 05/28/2012 PET CT  Findings: Right chest wall Port-A-Cath tip projects over the proximal SVC.  Mild peripheral reticular nodular opacities.  No pleural effusion or pneumothorax.  No acute osseous finding. Cardiomediastinal contours within normal range.  IMPRESSION: Mild peripheral reticular nodular opacities may correspond to the nodules described on recent PET CT. The differential includes infection or metastatic disease.   Original Report Authenticated By: Waneta Martins, M.D.    Mr Lumbar Spine W Wo Contrast  07/25/2012  *RADIOLOGY REPORT*  Clinical Data: Low back pain, fever, and history of Hodgkin's lymphoma.  MRI LUMBAR SPINE WITHOUT AND WITH CONTRAST  Technique:  Multiplanar and multiecho pulse sequences of the lumbar spine were obtained without and with intravenous contrast.  Contrast: 18mL MULTIHANCE GADOBENATE DIMEGLUMINE 529 MG/ML IV SOLN  Comparison: None.  Findings: Normal conus tip is at L2.  Normal paraspinal soft tissues.  The discs from T11-12 through L5-S1 are normal.  There is no facet arthritis, spinal stenosis, spondylolisthesis, or other abnormality.  There is no pathologic enhancement after contrast administration.  IMPRESSION: Normal MRI of the lumbar spine.   Original Report Authenticated By: Gwynn Burly, M.D.     CBC  Lab 07/29/12 0430 07/28/12 0405 07/27/12 0526 07/26/12 0550  07/25/12 0642 07/24/12 2310  WBC 2.3* 2.5* 2.9* 3.2* 2.8* --  HGB 11.3* 11.5* 11.8* 11.9* 11.5* --  HCT 31.6* 32.3* 33.3* 33.8* 32.7* --  PLT 176 183 164 173 165 --  MCV 83.6 83.0 83.0 83.5 85.4 --  MCH 29.9 29.6 29.4 29.4 30.0 --  MCHC 35.8 35.6 35.4 35.2 35.2 --  RDW 12.2 12.2 12.1 12.0 12.2 --  LYMPHSABS 0.4* 0.2* 0.7 0.6* -- 0.5*  MONOABS 0.6 0.8 0.3 0.5 -- 0.7  EOSABS 0.3 0.4 0.3 0.3 -- 0.3  BASOSABS 0.0 0.0 0.0 0.0 -- 0.0  BANDABS -- -- -- -- -- --    Chemistries   Lab 07/29/12 0430 07/28/12 0405 07/27/12 0526 07/26/12 0550 07/25/12 0642 07/24/12 2310  NA 136 137 136 132* -- 132*  K 3.7 3.4* 3.4* 3.5 -- 3.7  CL 101 101 100 98 -- 95*  CO2 26 28 27 23  -- 20  GLUCOSE 77 78 103* 117* -- 76  BUN <3* <3* <3* 3* -- 9  CREATININE 0.85 0.81 0.66 0.67 0.68 --  CALCIUM 8.7 8.5 8.7 8.5 -- 9.3  MG 1.6 -- -- -- -- --  AST -- -- -- -- -- 17  ALT -- -- -- -- -- 21  ALKPHOS -- -- -- -- -- 74  BILITOT -- -- -- -- -- 0.4   ------------------------------------------------------------------------------------------------------------------ estimated creatinine clearance is 113.9 ml/min (by C-G formula based on Cr of 0.85). ------------------------------------------------------------------------------------------------------------------ No results found for this basename: HGBA1C:2 in the last 72 hours ------------------------------------------------------------------------------------------------------------------ No results found for this basename: CHOL:2,HDL:2,LDLCALC:2,TRIG:2,CHOLHDL:2,LDLDIRECT:2 in the last 72 hours ------------------------------------------------------------------------------------------------------------------ No results found for this basename: TSH,T4TOTAL,FREET3,T3FREE,THYROIDAB in the last 72 hours ------------------------------------------------------------------------------------------------------------------ No results found for this basename:  VITAMINB12:2,FOLATE:2,FERRITIN:2,TIBC:2,IRON:2,RETICCTPCT:2 in the last  72 hours  Coagulation profile No results found for this basename: INR:5,PROTIME:5 in the last 168 hours  No results found for this basename: DDIMER:2 in the last 72 hours  Cardiac Enzymes No results found for this basename: CK:3,CKMB:3,TROPONINI:3,MYOGLOBIN:3 in the last 168 hours ------------------------------------------------------------------------------------------------------------------ No components found with this basename: POCBNP:3

## 2012-07-30 DIAGNOSIS — R197 Diarrhea, unspecified: Secondary | ICD-10-CM

## 2012-07-30 LAB — GLUCOSE, CAPILLARY
Glucose-Capillary: 91 mg/dL (ref 70–99)
Glucose-Capillary: 121 mg/dL — ABNORMAL HIGH (ref 70–99)
Glucose-Capillary: 82 mg/dL (ref 70–99)

## 2012-07-30 LAB — CBC WITH DIFFERENTIAL/PLATELET
Basophils Relative: 1 % (ref 0–1)
HCT: 30.8 % — ABNORMAL LOW (ref 36.0–46.0)
Hemoglobin: 11 g/dL — ABNORMAL LOW (ref 12.0–15.0)
Lymphocytes Relative: 19 % (ref 12–46)
Lymphs Abs: 0.4 10*3/uL — ABNORMAL LOW (ref 0.7–4.0)
MCHC: 35.7 g/dL (ref 30.0–36.0)
Monocytes Absolute: 0.4 10*3/uL (ref 0.1–1.0)
Monocytes Relative: 20 % — ABNORMAL HIGH (ref 3–12)
Neutro Abs: 1 10*3/uL — ABNORMAL LOW (ref 1.7–7.7)
Neutrophils Relative %: 48 % (ref 43–77)
RBC: 3.71 MIL/uL — ABNORMAL LOW (ref 3.87–5.11)

## 2012-07-30 LAB — BASIC METABOLIC PANEL
BUN: <3 mg/dL — ABNORMAL LOW (ref 6–23)
CO2: 25 mEq/L (ref 19–32)
Chloride: 100 mEq/L (ref 96–112)
Creatinine, Ser: 0.86 mg/dL (ref 0.50–1.10)
GFR calc Af Amer: >90 mL/min (ref >90)
Glucose, Bld: 89 mg/dL (ref 70–99)
Potassium: 3.6 mEq/L (ref 3.5–5.1)

## 2012-07-30 MED ORDER — SACCHAROMYCES BOULARDII 250 MG PO CAPS
250.0000 mg | ORAL_CAPSULE | Freq: Two times a day (BID) | ORAL | Status: DC
  Administered 2012-07-30 – 2012-08-02 (×6): 250 mg via ORAL
  Filled 2012-07-30 (×8): qty 1

## 2012-07-30 MED ORDER — SODIUM CHLORIDE 0.9 % IV SOLN
INTRAVENOUS | Status: AC
  Administered 2012-07-30: 12:00:00 via INTRAVENOUS

## 2012-07-30 MED ORDER — AMLODIPINE BESYLATE 5 MG PO TABS
5.0000 mg | ORAL_TABLET | Freq: Every day | ORAL | Status: DC
  Filled 2012-07-30: qty 1

## 2012-07-30 NOTE — Progress Notes (Addendum)
[facility]                                                                                Patient Demographics  [name], is a 31 y.o. female  WGN:[id number]  HQI:[id number]  DOB - January 28, 1981  Admit date - 07/24/2012  Admitting Physician Gery Pray, MD  Outpatient Primary MD for the patient is Dorrene German, MD  LOS - 6   Chief Complaint  Patient presents with  . Fever        Assessment & Plan   1. Fevers with possible pneumonia in an immunocompromised patient who has history of Hodgkin's lymphoma and currently undergoing chemotherapy- blood cultures have been obtained negative so far.   Patient has history of interstitial infiltrates on CT scan for the last few months, she was initially treated for pneumonia in immunocompromised patient with broad-spectrum antibiotics without much benefit, since she continued to spike fevers despite being on broad antibiotics it was thought that her pneumonia was most likely related to atypical organisms, she was then kept on IV Bactrim and Levaquin, ID and pulmonary have been consulted both are following the patient. Pulmonary is considering Bronch possibly on 07/31/2012 to get a good specimen for cultures and guided treatment. She is still spiking fevers and raising the suspicion for possible fungal infection.  Discussed with infectious disease physician Dr. Algis Liming on 07/29/2012 along with Dr. Cyndie Chime her on 07/29/2012.     2. History of diabetes mellitus type 2- Glucophage will be held while she is in the hospital, sliding scale insulin a.c. at bedtime.   CBG (last 3)   Basename 07/30/12 0727 07/29/12 2137 07/29/12 1701  GLUCAP 121* 91 109*       3. History of HTN- blood pressure is slightly on the lower side this morning have reduced her Norvasc dose, gentle IV fluids on intermittent basis, will give her a liter and a half of normal saline on 07-30-2012 again.     4. History of back spasms  and left ancillary eczema post radiation-supportive care with baclofen and steroid cream.     5. Mild antibiotic induced diarrhea reported on 07-30-2012- will be placed on a probiotic we'll rule out C. Difficile.     Code Status: Full  Family Communication: D/W patient and Family 07-25-12  Disposition Plan: Home    Procedures - CXR, CT chest, may get bronch   Consults  - Oncology, infectious disease, pulmonary   Antibiotics  Vanco,Mero(stopped) Levaquin 07-24-12 , Bactrim IV 07-28-12   Anti-infectives     Start     Dose/Rate Route Frequency Ordered Stop   07/27/12 1400  sulfamethoxazole-trimethoprim (BACTRIM) 450 mg in dextrose 5 % 500 mL IVPB       450 mg 352.1 mL/hr over 90 Minutes Intravenous 3 times per day 07/27/12 1321     07/25/12 0900   levofloxacin (LEVAQUIN) IVPB 750 mg        750 mg 100 mL/hr over 90 Minutes Intravenous Daily 07/25/12 0802     07/25/12 0900   meropenem (MERREM) 1 g in sodium chloride 0.9 % 100 mL IVPB  Status:  Discontinued        1  g 200 mL/hr over 30 Minutes Intravenous Every 8 hours 07/25/12 0802 07/27/12 1300   07/25/12 0600   vancomycin (VANCOCIN) IVPB 1000 mg/200 mL premix  Status:  Discontinued        1,000 mg 200 mL/hr over 60 Minutes Intravenous Every 8 hours 07/25/12 0534 07/27/12 1300   07/25/12 0515   cefTRIAXone (ROCEPHIN) 1 g in dextrose 5 % 50 mL IVPB  Status:  Discontinued        1 g 100 mL/hr over 30 Minutes Intravenous Every 24 hours 07/25/12 0503 07/25/12 0713   07/25/12 0515   azithromycin (ZITHROMAX) 500 mg in dextrose 5 % 250 mL IVPB  Status:  Discontinued        500 mg 250 mL/hr over 60 Minutes Intravenous Every 24 hours 07/25/12 0503 07/25/12 0810   07/25/12 0400   cefTRIAXone (ROCEPHIN) 1 g in dextrose 5 % 50 mL IVPB        1 g 100 mL/hr over 30 Minutes Intravenous  Once 07/25/12 0347 07/25/12 0709   07/25/12 0400   azithromycin (ZITHROMAX) tablet 500 mg  Status:  Discontinued        500 mg Oral  Once 07/25/12  0347 07/25/12 0713          Time Spent in minutes   35   DVT Prophylaxis  Lovenox    Lab Results  Component Value Date   PLT 182 07/30/2012     Scheduled Meds:    . amLODipine  10 mg Oral Daily  . enoxaparin (LOVENOX) injection  40 mg Subcutaneous Q24H  . insulin aspart  0-15 Units Subcutaneous TID WC  . insulin aspart  0-5 Units Subcutaneous QHS  . levofloxacin (LEVAQUIN) IV  750 mg Intravenous Daily  . lidocaine  1 patch Transdermal Q24H  . metoprolol tartrate  25 mg Oral BID  . sucralfate  1 g Oral TID AC & HS  . sulfamethoxazole-trimethoprim  450 mg Intravenous Q8H   Continuous Infusions:    . sodium chloride     PRN Meds:.acetaminophen, alum & mag hydroxide-simeth, baclofen, guaiFENesin-dextromethorphan, HYDROcodone-acetaminophen, hydrocortisone cream, LORazepam, morphine injection, ondansetron (ZOFRAN) IV, ondansetron, sodium chloride, zolpidem  Susa Raring K M.D on 07/30/2012 at 10:24 AM  Between 7am to 7pm - Pager - [phone]  After 7pm go to [email or web address] - password [email or web address]  And look for the night coverage person covering for me after hours  [facility] Office  [phone]    Subjective:   [name] today has, No headache, No chest pain, No abdominal pain - No Nausea, No new weakness tingling or numbness, No Cough - SOB.  Dull low-back pain which is nonradiating.  Objective:   Filed Vitals:   07/29/12 1440 07/29/12 1446 07/29/12 2213 07/30/12 0657  BP: 178/110 160/94 120/79 100/66  Pulse: 78  77 72  Temp: 99.6 F (37.6 C)  99.4 F (37.4 C) 98.6 F (37 C)  TempSrc: Oral  Oral Oral  Resp: 18  18 18   Height:      Weight:      SpO2: 98%  97% 96%    Wt Readings from Last 3 Encounters:  07/25/12 90.6 kg (199 lb 11.8 oz)  07/16/12 90.538 kg (199 lb 9.6 oz)  07/04/12 95.89 kg (211 lb 6.4 oz)     Intake/Output Summary (Last 24 hours) at 07/30/12 1024 Last data filed at 07/29/12 1606  Gross per 24 hour  Intake    240  ml  Output  0 ml  Net    240 ml    Exam Awake Alert, Oriented X 3, No new F.N deficits, Normal affect [place].AT,PERRAL Supple Neck,No JVD, No cervical lymphadenopathy appriciated.  Symmetrical Chest wall movement, Good air movement bilaterally, CTAB RRR,No Gallops,Rubs or new Murmurs, No Parasternal Heave +ve B.Sounds, Abd Soft, Non tender, No organomegaly appriciated, No rebound - guarding or rigidity. No Cyanosis, Clubbing or edema, No new Rash or bruise , L axilla skin darkened post radiation   Data Review    Radiology Reports Dg Chest 2 View  07/24/2012  *RADIOLOGY REPORT*  Clinical Data: Fever, cough.  CHEST - 2 VIEW  Comparison: 05/28/2012 PET CT  Findings: Right chest wall Port-A-Cath tip projects over the proximal SVC.  Mild peripheral reticular nodular opacities.  No pleural effusion or pneumothorax.  No acute osseous finding. Cardiomediastinal contours within normal range.  IMPRESSION: Mild peripheral reticular nodular opacities may correspond to the nodules described on recent PET CT. The differential includes infection or metastatic disease.   Original Report Authenticated By: Waneta Martins, M.D.    Mr Lumbar Spine W Wo Contrast  07/25/2012  *RADIOLOGY REPORT*  Clinical Data: Low back pain, fever, and history of Hodgkin's lymphoma.  MRI LUMBAR SPINE WITHOUT AND WITH CONTRAST  Technique:  Multiplanar and multiecho pulse sequences of the lumbar spine were obtained without and with intravenous contrast.  Contrast: 18mL MULTIHANCE GADOBENATE DIMEGLUMINE 529 MG/ML IV SOLN  Comparison: None.  Findings: Normal conus tip is at L2.  Normal paraspinal soft tissues.  The discs from T11-12 through L5-S1 are normal.  There is no facet arthritis, spinal stenosis, spondylolisthesis, or other abnormality.  There is no pathologic enhancement after contrast administration.  IMPRESSION: Normal MRI of the lumbar spine.   Original Report Authenticated By: Gwynn Burly, M.D.     CBC  Lab  07/30/12 0415 07/29/12 0430 07/28/12 0405 07/27/12 0526 07/26/12 0550  WBC 2.2* 2.3* 2.5* 2.9* 3.2*  HGB 11.0* 11.3* 11.5* 11.8* 11.9*  HCT 30.8* 31.6* 32.3* 33.3* 33.8*  PLT 182 176 183 164 173  MCV 83.0 83.6 83.0 83.0 83.5  MCH 29.6 29.9 29.6 29.4 29.4  MCHC 35.7 35.8 35.6 35.4 35.2  RDW 12.3 12.2 12.2 12.1 12.0  LYMPHSABS 0.4* 0.4* 0.2* 0.7 0.6*  MONOABS 0.4 0.6 0.8 0.3 0.5  EOSABS 0.3 0.3 0.4 0.3 0.3  BASOSABS 0.0 0.0 0.0 0.0 0.0  BANDABS -- -- -- -- --    Chemistries   Lab 07/30/12 0415 07/29/12 0430 07/28/12 0405 07/27/12 0526 07/26/12 0550 07/24/12 2310  NA 134* 136 137 136 132* --  K 3.6 3.7 3.4* 3.4* 3.5 --  CL 100 101 101 100 98 --  CO2 25 26 28 27 23  --  GLUCOSE 89 77 78 103* 117* --  BUN <3* <3* <3* <3* 3* --  CREATININE 0.86 0.85 0.81 0.66 0.67 --  CALCIUM 8.7 8.7 8.5 8.7 8.5 --  MG 1.5 1.6 -- -- -- --  AST -- -- -- -- -- 17  ALT -- -- -- -- -- 21  ALKPHOS -- -- -- -- -- 74  BILITOT -- -- -- -- -- 0.4   ------------------------------------------------------------------------------------------------------------------ estimated creatinine clearance is 112.5 ml/min (by C-G formula based on Cr of 0.86). ------------------------------------------------------------------------------------------------------------------ No results found for this basename: HGBA1C:2 in the last 72 hours ------------------------------------------------------------------------------------------------------------------ No results found for this basename: CHOL:2,HDL:2,LDLCALC:2,TRIG:2,CHOLHDL:2,LDLDIRECT:2 in the last 72 hours ------------------------------------------------------------------------------------------------------------------ No results found for this basename: TSH,T4TOTAL,FREET3,T3FREE,THYROIDAB in the last 72 hours ------------------------------------------------------------------------------------------------------------------ No  results found for this basename:  VITAMINB12:2,FOLATE:2,FERRITIN:2,TIBC:2,IRON:2,RETICCTPCT:2 in the last 72 hours  Coagulation profile No results found for this basename: INR:5,PROTIME:5 in the last 168 hours  No results found for this basename: DDIMER:2 in the last 72 hours  Cardiac Enzymes No results found for this basename: CK:3,CKMB:3,TROPONINI:3,MYOGLOBIN:3 in the last 168 hours ------------------------------------------------------------------------------------------------------------------ No components found with this basename: POCBNP:3

## 2012-07-30 NOTE — Progress Notes (Signed)
Temp trend down on Levaquin/Septra  T max 99.6 last 24 hrs Legionella antigen, urine negative Routine blood, urine, cultures negative Fungal antigen studies pending Total WBC remains low @ 2,200 48% poly 24% monos Now with diarrhea but cough decreased Exam: Lungs remain clear but deep breathing precipitates coughing Impression: #1.Pulmonary infiltrates/cough/fever in Hodgkin's patient  Clinically improving day #4 Levoquin #3 Septra Will likely proceed with bronchoscopy on 9/3 to direct further therapy but suspect this is in fact, PCP. #2. Diarrhea - likely antibiotic related but given clinical reponse to Rx, I would be reluctant to make any changes at this time.  I will defer to ID rec.

## 2012-07-30 NOTE — Progress Notes (Signed)
ANTIBIOTIC CONSULT NOTE  Pharmacy Consult for IV Septra/Levaquin Indication: PNA  Allergies  Allergen Reactions  . Penicillins Rash    Patient Measurements: Height: 5\' 7"  (170.2 cm) Weight: 199 lb 11.8 oz (90.6 kg) IBW/kg (Calculated) : 61.6    Vital Signs: Temp: 98.6 F (37 C) (09/02 0657) Temp src: Oral (09/02 0657) BP: 100/66 mmHg (09/02 0657) Pulse Rate: 72  (09/02 0657) Intake/Output from previous day: 09/01 0701 - 09/02 0700 In: 240 [P.O.:240] Out: -  Intake/Output from this shift:    Labs:  Basename 07/30/12 0415 07/29/12 0430 07/28/12 0405  WBC 2.2* 2.3* 2.5*  HGB 11.0* 11.3* 11.5*  PLT 182 176 183  LABCREA -- -- --  CREATININE 0.86 0.85 0.81   Estimated Creatinine Clearance: 112.5 ml/min (by C-G formula based on Cr of 0.86).   Microbiology: Recent Results (from the past 720 hour(s))  URINE CULTURE     Status: Normal   Collection Time   07/24/12 10:59 PM      Component Value Range Status Comment   Specimen Description URINE, CLEAN CATCH   Final    Special Requests Immunocompromised   Final    Culture  Setup Time 07/25/2012 05:50   Final    Colony Count >=100,000 COLONIES/ML   Final    Culture     Final    Value: Multiple bacterial morphotypes present, none predominant. Suggest appropriate recollection if clinically indicated.   Report Status 07/26/2012 FINAL   Final   CULTURE, BLOOD (ROUTINE X 2)     Status: Normal (Preliminary result)   Collection Time   07/24/12 11:10 PM      Component Value Range Status Comment   Specimen Description BLOOD RIGHT ANTECUBITAL   Final    Special Requests BOTTLES DRAWN AEROBIC AND ANAEROBIC 4CC   Final    Culture  Setup Time 07/25/2012 04:36   Final    Culture     Final    Value:        BLOOD CULTURE RECEIVED NO GROWTH TO DATE CULTURE WILL BE HELD FOR 5 DAYS BEFORE ISSUING A FINAL NEGATIVE REPORT   Report Status PENDING   Incomplete   CULTURE, BLOOD (ROUTINE X 2)     Status: Normal (Preliminary result)   Collection Time   07/24/12 11:20 PM      Component Value Range Status Comment   Specimen Description BLOOD NO SITE INDICATED   Final    Special Requests     Final    Value: BOTTLES DRAWN AEROBIC AND ANAEROBIC NO VOLUME INDICATED   Culture  Setup Time 07/25/2012 04:36   Final    Culture     Final    Value:        BLOOD CULTURE RECEIVED NO GROWTH TO DATE CULTURE WILL BE HELD FOR 5 DAYS BEFORE ISSUING A FINAL NEGATIVE REPORT   Report Status PENDING   Incomplete   CULTURE, BLOOD (SINGLE)     Status: Normal (Preliminary result)   Collection Time   07/25/12  6:30 AM      Component Value Range Status Comment   Specimen Description BLOOD PORTA CATH FROM MEDIPORT   Final    Special Requests BOTTLES DRAWN AEROBIC AND ANAEROBIC 10CC EACH   Final    Culture  Setup Time 07/25/2012 11:00   Final    Culture     Final    Value:        BLOOD CULTURE RECEIVED NO GROWTH TO DATE CULTURE WILL BE HELD  FOR 5 DAYS BEFORE ISSUING A FINAL NEGATIVE REPORT   Report Status PENDING   Incomplete   CULTURE, BLOOD (ROUTINE X 2)     Status: Normal (Preliminary result)   Collection Time   07/27/12  8:00 AM      Component Value Range Status Comment   Specimen Description BLOOD RIGHT HAND   Final    Special Requests BOTTLES DRAWN AEROBIC AND ANAEROBIC [facility]   Final    Culture  Setup Time 07/27/2012 12:29   Final    Culture     Final    Value:        BLOOD CULTURE RECEIVED NO GROWTH TO DATE CULTURE WILL BE HELD FOR 5 DAYS BEFORE ISSUING A FINAL NEGATIVE REPORT   Report Status PENDING   Incomplete   CULTURE, BLOOD (ROUTINE X 2)     Status: Normal (Preliminary result)   Collection Time   07/27/12 11:05 AM      Component Value Range Status Comment   Specimen Description BLOOD LEFT HAND   Final    Special Requests BOTTLES DRAWN AEROBIC AND ANAEROBIC 6CC   Final    Culture  Setup Time 07/27/2012 15:02   Final    Culture     Final    Value:        BLOOD CULTURE RECEIVED NO GROWTH TO DATE CULTURE WILL BE HELD FOR 5 DAYS BEFORE  ISSUING A FINAL NEGATIVE REPORT   Report Status PENDING   Incomplete     Medications:  Scheduled:     . amLODipine  10 mg Oral Daily  . enoxaparin (LOVENOX) injection  40 mg Subcutaneous Q24H  . insulin aspart  0-15 Units Subcutaneous TID WC  . insulin aspart  0-5 Units Subcutaneous QHS  . levofloxacin (LEVAQUIN) IV  750 mg Intravenous Daily  . lidocaine  1 patch Transdermal Q24H  . metoprolol tartrate  25 mg Oral BID  . sucralfate  1 g Oral TID AC & HS  . sulfamethoxazole-trimethoprim  450 mg Intravenous Q8H   Infusions:     . 0.9 % NaCl with KCl 20 mEq / L 50 mL/hr at 07/28/12 0900    Assessment:  31 yo F with hx of Nodular Sclerosing Hodgkin's lymphoma (Stage IIB) admitted with ground glass opacities in lungs and fever.    Patient was initially started on broad spectrum abx with vancomycin/levaquin/meropenem on 8/28.    ID consulted, abx narrowed on 8/30 to Levaquin/Septra for opportunistic infection/PNA   Temp spike 8/31, continued low-grade temps. Renal function stable, WBC low/ANC wnl.  All cultures negative to date.  Goal of Therapy:  Bactrim per renal function/erradication of infection   Plan:  1.) Continue Septra 450 mg (5 mg/kg) IV Q8h  2.) Continue Levaquin  3.) Monitor renal function  4.) Follow up cultures/labs 5.) follow with pulmonary/ID   Loralee Pacas, PharmD, BCPS Pager: [phone] 7:47 AM 07/30/2012

## 2012-07-30 NOTE — Progress Notes (Signed)
[facility]  Day # 6 levaquin Day #4 bactrim (IV)    Subjective: Shortness of breath better fever better but now with one loose stool   Antibiotics:  Anti-infectives     Start     Dose/Rate Route Frequency Ordered Stop   07/27/12 1400  sulfamethoxazole-trimethoprim (BACTRIM) 450 mg in dextrose 5 % 500 mL IVPB       450 mg 352.1 mL/hr over 90 Minutes Intravenous 3 times per day 07/27/12 1321     07/25/12 0900   levofloxacin (LEVAQUIN) IVPB 750 mg        750 mg 100 mL/hr over 90 Minutes Intravenous Daily 07/25/12 0802     07/25/12 0900   meropenem (MERREM) 1 g in sodium chloride 0.9 % 100 mL IVPB  Status:  Discontinued        1 g 200 mL/hr over 30 Minutes Intravenous Every 8 hours 07/25/12 0802 07/27/12 1300   07/25/12 0600   vancomycin (VANCOCIN) IVPB 1000 mg/200 mL premix  Status:  Discontinued        1,000 mg 200 mL/hr over 60 Minutes Intravenous Every 8 hours 07/25/12 0534 07/27/12 1300   07/25/12 0515   cefTRIAXone (ROCEPHIN) 1 g in dextrose 5 % 50 mL IVPB  Status:  Discontinued        1 g 100 mL/hr over 30 Minutes Intravenous Every 24 hours 07/25/12 0503 07/25/12 0713   07/25/12 0515   azithromycin (ZITHROMAX) 500 mg in dextrose 5 % 250 mL IVPB  Status:  Discontinued        500 mg 250 mL/hr over 60 Minutes Intravenous Every 24 hours 07/25/12 0503 07/25/12 0810   07/25/12 0400   cefTRIAXone (ROCEPHIN) 1 g in dextrose 5 % 50 mL IVPB        1 g 100 mL/hr over 30 Minutes Intravenous  Once 07/25/12 0347 07/25/12 0709   07/25/12 0400   azithromycin (ZITHROMAX) tablet 500 mg  Status:  Discontinued        500 mg Oral  Once 07/25/12 0347 07/25/12 0713          Medications: Scheduled Meds:    . amLODipine  5 mg Oral Daily  . enoxaparin (LOVENOX) injection  40 mg Subcutaneous Q24H  . insulin aspart  0-15 Units Subcutaneous TID WC  . insulin aspart  0-5 Units Subcutaneous QHS  . levofloxacin (LEVAQUIN) IV  750 mg Intravenous Daily  .  lidocaine  1 patch Transdermal Q24H  . metoprolol tartrate  25 mg Oral BID  . saccharomyces boulardii  250 mg Oral BID  . sucralfate  1 g Oral TID AC & HS  . sulfamethoxazole-trimethoprim  450 mg Intravenous Q8H  . DISCONTD: amLODipine  10 mg Oral Daily   Continuous Infusions:    . sodium chloride 100 mL/hr at 07/30/12 1204   PRN Meds:.acetaminophen, alum & mag hydroxide-simeth, baclofen, guaiFENesin-dextromethorphan, HYDROcodone-acetaminophen, hydrocortisone cream, LORazepam, morphine injection, ondansetron (ZOFRAN) IV, ondansetron, sodium chloride, zolpidem   Objective: Weight change:   Intake/Output Summary (Last 24 hours) at 07/30/12 1233 Last data filed at 07/29/12 1606  Gross per 24 hour  Intake    240 ml  Output      0 ml  Net    240 ml   Blood pressure 100/66, pulse 72, temperature 98.6 F (37 C), temperature source Oral, resp. rate 18, height 5\' 7"  (1.702 m), weight 199 lb 11.8 oz (90.6 kg), SpO2 96.00%. Temp:  [98.6 F (37 C)-99.6  F (37.6 C)] 98.6 F (37 C) (09/02 0657) Pulse Rate:  [72-78] 72  (09/02 0657) Resp:  [18] 18  (09/02 0657) BP: (100-178)/(66-110) 100/66 mmHg (09/02 0657) SpO2:  [96 %-98 %] 96 % (09/02 0657)  Physical Exam: General appearance: alert, cooperative and no distress  Resp: clear to auscultation bilaterally  Cardio: regular rate and rhythm, S1, S2 normal, no murmur, click, rub or gallop  GI: soft, non-tender; bowel sounds normal; no masses, no organomegaly  Extremities: extremities normal, atraumatic, no cyanosis or edema  Skin: portacath site clean     Lab Results:  Basename 07/30/12 0415 07/29/12 0430  WBC 2.2* 2.3*  HGB 11.0* 11.3*  HCT 30.8* 31.6*  PLT 182 176    BMET  Basename 07/30/12 0415 07/29/12 0430  NA 134* 136  K 3.6 3.7  CL 100 101  CO2 25 26  GLUCOSE 89 77  BUN <3* <3*  CREATININE 0.86 0.85  CALCIUM 8.7 8.7    Micro Results: Recent Results (from the past 240 hour(s))  URINE CULTURE     Status: Normal    Collection Time   07/24/12 10:59 PM      Component Value Range Status Comment   Specimen Description URINE, CLEAN CATCH   Final    Special Requests Immunocompromised   Final    Culture  Setup Time 07/25/2012 05:50   Final    Colony Count >=100,000 COLONIES/ML   Final    Culture     Final    Value: Multiple bacterial morphotypes present, none predominant. Suggest appropriate recollection if clinically indicated.   Report Status 07/26/2012 FINAL   Final   CULTURE, BLOOD (ROUTINE X 2)     Status: Normal (Preliminary result)   Collection Time   07/24/12 11:10 PM      Component Value Range Status Comment   Specimen Description BLOOD RIGHT ANTECUBITAL   Final    Special Requests BOTTLES DRAWN AEROBIC AND ANAEROBIC 4CC   Final    Culture  Setup Time 07/25/2012 04:36   Final    Culture     Final    Value:        BLOOD CULTURE RECEIVED NO GROWTH TO DATE CULTURE WILL BE HELD FOR 5 DAYS BEFORE ISSUING A FINAL NEGATIVE REPORT   Report Status PENDING   Incomplete   CULTURE, BLOOD (ROUTINE X 2)     Status: Normal (Preliminary result)   Collection Time   07/24/12 11:20 PM      Component Value Range Status Comment   Specimen Description BLOOD NO SITE INDICATED   Final    Special Requests     Final    Value: BOTTLES DRAWN AEROBIC AND ANAEROBIC NO VOLUME INDICATED   Culture  Setup Time 07/25/2012 04:36   Final    Culture     Final    Value:        BLOOD CULTURE RECEIVED NO GROWTH TO DATE CULTURE WILL BE HELD FOR 5 DAYS BEFORE ISSUING A FINAL NEGATIVE REPORT   Report Status PENDING   Incomplete   CULTURE, BLOOD (SINGLE)     Status: Normal (Preliminary result)   Collection Time   07/25/12  6:30 AM      Component Value Range Status Comment   Specimen Description BLOOD PORTA CATH FROM MEDIPORT   Final    Special Requests BOTTLES DRAWN AEROBIC AND ANAEROBIC 10CC EACH   Final    Culture  Setup Time 07/25/2012 11:00   Final  Culture     Final    Value:        BLOOD CULTURE RECEIVED NO GROWTH TO DATE  CULTURE WILL BE HELD FOR 5 DAYS BEFORE ISSUING A FINAL NEGATIVE REPORT   Report Status PENDING   Incomplete   CULTURE, BLOOD (ROUTINE X 2)     Status: Normal (Preliminary result)   Collection Time   07/27/12  8:00 AM      Component Value Range Status Comment   Specimen Description BLOOD RIGHT HAND   Final    Special Requests BOTTLES DRAWN AEROBIC AND ANAEROBIC 7CC   Final    Culture  Setup Time 07/27/2012 12:29   Final    Culture     Final    Value:        BLOOD CULTURE RECEIVED NO GROWTH TO DATE CULTURE WILL BE HELD FOR 5 DAYS BEFORE ISSUING A FINAL NEGATIVE REPORT   Report Status PENDING   Incomplete   CULTURE, BLOOD (ROUTINE X 2)     Status: Normal (Preliminary result)   Collection Time   07/27/12 11:05 AM      Component Value Range Status Comment   Specimen Description BLOOD LEFT HAND   Final    Special Requests BOTTLES DRAWN AEROBIC AND ANAEROBIC [facility]   Final    Culture  Setup Time 07/27/2012 15:02   Final    Culture     Final    Value:        BLOOD CULTURE RECEIVED NO GROWTH TO DATE CULTURE WILL BE HELD FOR 5 DAYS BEFORE ISSUING A FINAL NEGATIVE REPORT   Report Status PENDING   Incomplete     Studies/Results: No results found.    Assessment/Plan: [name] is a 30 y.o. female with  Stage II B hodgkins lymphoma with fevers and ground glass opacities in the lungs. She had been on vancomycin merrem and levaquin from 28th onwards withou much improvement, TMP/SMX was added and she seems to have had SOME improvement since though her curve has waxed and waned.  1) Atypical Pneumonia in immune compromised pt; I still do worry about an undiagnosed fungal infection besides PCP. I think a bronchoscopy with BAL FOR FUNGAL, AFB AND BACTERIAL INCLUDING NOCARDIA CULTURES, VIRAL CULTURES, VIRAL PCR PANEL could help elucidate her pathology. Earlier she is continuing to improve he would not be altogether unreasonable to postpone this intervention. From a diagnostic standpoint deeper  cultures be helpful --We can discontinue the Levaquin given to cardiac 6 days of therapy and given the risk for possible C. Difficile colitis --CONTINUE BACTRIM  And will consider a 21 day course --a serum galactomannan and urine histoplasma ag pendnig  Dr.Campbell been here tomorrow.   LOS: 6 days   Acey Lav 07/30/2012, 12:33 PM

## 2012-07-31 ENCOUNTER — Inpatient Hospital Stay (HOSPITAL_COMMUNITY): Payer: Medicaid Other

## 2012-07-31 DIAGNOSIS — J189 Pneumonia, unspecified organism: Principal | ICD-10-CM

## 2012-07-31 DIAGNOSIS — I10 Essential (primary) hypertension: Secondary | ICD-10-CM

## 2012-07-31 LAB — CBC WITH DIFFERENTIAL/PLATELET
Basophils Relative: 1 % (ref 0–1)
Eosinophils Absolute: 0.4 10*3/uL (ref 0.0–0.7)
HCT: 31.2 % — ABNORMAL LOW (ref 36.0–46.0)
Hemoglobin: 10.9 g/dL — ABNORMAL LOW (ref 12.0–15.0)
Lymphocytes Relative: 17 % (ref 12–46)
Lymphs Abs: 0.3 10*3/uL — ABNORMAL LOW (ref 0.7–4.0)
MCHC: 34.9 g/dL (ref 30.0–36.0)
MCV: 82.8 fL (ref 78.0–100.0)
Neutro Abs: 0.7 10*3/uL — ABNORMAL LOW (ref 1.7–7.7)
RDW: 12.2 % (ref 11.5–15.5)

## 2012-07-31 LAB — CULTURE, BLOOD (ROUTINE X 2)

## 2012-07-31 LAB — GLUCOSE, CAPILLARY
Glucose-Capillary: 101 mg/dL — ABNORMAL HIGH (ref 70–99)
Glucose-Capillary: 95 mg/dL (ref 70–99)
Glucose-Capillary: 77 mg/dL (ref 70–99)

## 2012-07-31 LAB — CLOSTRIDIUM DIFFICILE BY PCR: Toxigenic C. Difficile by PCR: NEGATIVE

## 2012-07-31 LAB — BASIC METABOLIC PANEL
BUN: <3 mg/dL — ABNORMAL LOW (ref 6–23)
Chloride: 99 mEq/L (ref 96–112)
Creatinine, Ser: 0.82 mg/dL (ref 0.50–1.10)
Glucose, Bld: 90 mg/dL (ref 70–99)
Potassium: 3.6 mEq/L (ref 3.5–5.1)

## 2012-07-31 LAB — CULTURE, BLOOD (SINGLE): Culture: NO GROWTH

## 2012-07-31 MED ORDER — FLUCONAZOLE 200 MG PO TABS
200.0000 mg | ORAL_TABLET | Freq: Once | ORAL | Status: AC
  Administered 2012-08-01: 200 mg via ORAL
  Filled 2012-07-31 (×2): qty 1

## 2012-07-31 NOTE — Progress Notes (Signed)
[facility]                                                                                Patient Demographics  [name], is a 31 y.o. female  WUJ:[id number]  [id number]:[id number]  DOB - 1981/09/16  Admit date - 07/24/2012  Admitting Physician Gery Pray, MD  Outpatient Primary MD for the patient is Dorrene German, MD  LOS - 7   Chief Complaint  Patient presents with  . Fever        Assessment & Plan   1. Fevers with possible pneumonia in an immunocompromised patient who has history of Hodgkin's lymphoma and currently undergoing chemotherapy- blood cultures have been obtained negative so far.    Patient has history of interstitial infiltrates on CT scan for the last few months, she was initially treated for pneumonia in immunocompromised patient with broad-spectrum antibiotics without much benefit, since she continued to spike fevers despite being on broad antibiotics it was thought that her pneumonia was most likely related to atypical organisms, she was then kept on IV Bactrim and Levaquin (stopped on 07-2012 by infectious disease), ID and pulmonary have been consulted both are following the patient. Pulmonary is considering Bronch possibly on 07/31/2012 to get a good specimen for cultures and guided treatment.    However for the last 2 days patient has been afebrile, she has been now tapered down to Bactrim only, may be clinically we have she has PCP infection, will defer to ID and pulmonary if we should still proceed with Bronch.   Discussed with infectious disease physician Dr. Algis Liming on 07/29/2012 along with Dr. Cyndie Chime her on 07/29/2012.     2. History of diabetes mellitus type 2- Glucophage will be held while she is in the hospital, sliding scale insulin a.c. at bedtime.   CBG (last 3)   Basename 07/30/12 2132 07/30/12 1639 07/30/12 1133  GLUCAP 80 116* 82       3. History of HTN- blood pressure is slightly on the lower side  this morning have stopped her Norvasc dose, clinically she is not dehydrated anymore she has received plenty IV fluids latest  liter and a half of normal saline on 07-30-2012 again, total in excess of 5 L since admission.     4. History of back spasms and left ancillary eczema post radiation-supportive care with baclofen and steroid cream.     5. Mild antibiotic induced diarrhea reported on 07-30-2012- will be placed on a probiotic, ruled out C. Difficile.     Code Status: Full  Family Communication: D/W patient and Family 07-25-12  Disposition Plan: Home    Procedures - CXR, CT chest, may get bronch   Consults  - Oncology, infectious disease, pulmonary   Antibiotics  Vanco,Mero (stopped 07-27-12) Levaquin 07-24-12 to 07-30-12 , Bactrim IV 07-28-12 ongoing   Anti-infectives     Start     Dose/Rate Route Frequency Ordered Stop   07/27/12 1400  sulfamethoxazole-trimethoprim (BACTRIM) 450 mg in dextrose 5 % 500 mL IVPB       450 mg 352.1 mL/hr over 90 Minutes Intravenous 3 times per day 07/27/12 1321     07/25/12 0900  levofloxacin (LEVAQUIN) IVPB 750 mg  Status:  Discontinued        750 mg 100 mL/hr over 90 Minutes Intravenous Daily 07/25/12 0802 07/30/12 1236   07/25/12 0900   meropenem (MERREM) 1 g in sodium chloride 0.9 % 100 mL IVPB  Status:  Discontinued        1 g 200 mL/hr over 30 Minutes Intravenous Every 8 hours 07/25/12 0802 07/27/12 1300   07/25/12 0600   vancomycin (VANCOCIN) IVPB 1000 mg/200 mL premix  Status:  Discontinued        1,000 mg 200 mL/hr over 60 Minutes Intravenous Every 8 hours 07/25/12 0534 07/27/12 1300   07/25/12 0515   cefTRIAXone (ROCEPHIN) 1 g in dextrose 5 % 50 mL IVPB  Status:  Discontinued        1 g 100 mL/hr over 30 Minutes Intravenous Every 24 hours 07/25/12 0503 07/25/12 0713   07/25/12 0515   azithromycin (ZITHROMAX) 500 mg in dextrose 5 % 250 mL IVPB  Status:  Discontinued        500 mg 250 mL/hr over 60 Minutes Intravenous Every  24 hours 07/25/12 0503 07/25/12 0810   07/25/12 0400   cefTRIAXone (ROCEPHIN) 1 g in dextrose 5 % 50 mL IVPB        1 g 100 mL/hr over 30 Minutes Intravenous  Once 07/25/12 0347 07/25/12 0709   07/25/12 0400   azithromycin (ZITHROMAX) tablet 500 mg  Status:  Discontinued        500 mg Oral  Once 07/25/12 0347 07/25/12 0713          Time Spent in minutes   35   DVT Prophylaxis  Lovenox    Lab Results  Component Value Date   PLT 193 07/31/2012     Scheduled Meds:    . amLODipine  5 mg Oral Daily  . enoxaparin (LOVENOX) injection  40 mg Subcutaneous Q24H  . insulin aspart  0-15 Units Subcutaneous TID WC  . insulin aspart  0-5 Units Subcutaneous QHS  . lidocaine  1 patch Transdermal Q24H  . metoprolol tartrate  25 mg Oral BID  . saccharomyces boulardii  250 mg Oral BID  . sucralfate  1 g Oral TID AC & HS  . sulfamethoxazole-trimethoprim  450 mg Intravenous Q8H  . DISCONTD: amLODipine  10 mg Oral Daily  . DISCONTD: levofloxacin (LEVAQUIN) IV  750 mg Intravenous Daily   Continuous Infusions:    . sodium chloride 100 mL/hr at 07/30/12 1204   PRN Meds:.acetaminophen, alum & mag hydroxide-simeth, baclofen, guaiFENesin-dextromethorphan, HYDROcodone-acetaminophen, hydrocortisone cream, LORazepam, morphine injection, ondansetron (ZOFRAN) IV, ondansetron, sodium chloride, zolpidem  Susa Raring K M.D on 07/31/2012 at 9:39 AM  Between 7am to 7pm - Pager - [phone]  After 7pm go to [email or web address] - password [email or web address]  And look for the night coverage person covering for me after hours  [facility] Office  [phone]    Subjective:   [name] today has, No headache, No chest pain, No abdominal pain - No Nausea, No new weakness tingling or numbness, No Cough - SOB.  Dull low-back pain which is nonradiating.  Objective:   Filed Vitals:   07/30/12 0657 07/30/12 1330 07/30/12 2127 07/31/12 0503  BP: 100/66 112/70 115/64 98/45  Pulse: 72 74 65 65    Temp: 98.6 F (37 C) 98.7 F (37.1 C) 98.9 F (37.2 C) 97.8 F (36.6 C)  TempSrc: Oral Oral Oral Oral  Resp: 18 20 18 20   Height:  Weight:      SpO2: 96% 100% 100% 100%    Wt Readings from Last 3 Encounters:  07/25/12 90.6 kg (199 lb 11.8 oz)  07/16/12 90.538 kg (199 lb 9.6 oz)  07/04/12 95.89 kg (211 lb 6.4 oz)     Intake/Output Summary (Last 24 hours) at 07/31/12 0939 Last data filed at 07/31/12 0558  Gross per 24 hour  Intake 1056.13 ml  Output      0 ml  Net 1056.13 ml    Exam Awake Alert, Oriented X 3, No new F.N deficits, Normal affect [place].AT,PERRAL Supple Neck,No JVD, No cervical lymphadenopathy appriciated.  Symmetrical Chest wall movement, Good air movement bilaterally, CTAB RRR,No Gallops,Rubs or new Murmurs, No Parasternal Heave +ve B.Sounds, Abd Soft, Non tender, No organomegaly appriciated, No rebound - guarding or rigidity. No Cyanosis, Clubbing or edema, No new Rash or bruise , L axilla skin darkened post radiation   Data Review    Radiology Reports Dg Chest 2 View  07/24/2012  *RADIOLOGY REPORT*  Clinical Data: Fever, cough.  CHEST - 2 VIEW  Comparison: 05/28/2012 PET CT  Findings: Right chest wall Port-A-Cath tip projects over the proximal SVC.  Mild peripheral reticular nodular opacities.  No pleural effusion or pneumothorax.  No acute osseous finding. Cardiomediastinal contours within normal range.  IMPRESSION: Mild peripheral reticular nodular opacities may correspond to the nodules described on recent PET CT. The differential includes infection or metastatic disease.   Original Report Authenticated By: Waneta Martins, M.D.    Mr Lumbar Spine W Wo Contrast  07/25/2012  *RADIOLOGY REPORT*  Clinical Data: Low back pain, fever, and history of Hodgkin's lymphoma.  MRI LUMBAR SPINE WITHOUT AND WITH CONTRAST  Technique:  Multiplanar and multiecho pulse sequences of the lumbar spine were obtained without and with intravenous contrast.  Contrast: 18mL  MULTIHANCE GADOBENATE DIMEGLUMINE 529 MG/ML IV SOLN  Comparison: None.  Findings: Normal conus tip is at L2.  Normal paraspinal soft tissues.  The discs from T11-12 through L5-S1 are normal.  There is no facet arthritis, spinal stenosis, spondylolisthesis, or other abnormality.  There is no pathologic enhancement after contrast administration.  IMPRESSION: Normal MRI of the lumbar spine.   Original Report Authenticated By: Gwynn Burly, M.D.     CBC  Lab 07/31/12 0550 07/30/12 0415 07/29/12 0430 07/28/12 0405 07/27/12 0526  WBC 1.9* 2.2* 2.3* 2.5* 2.9*  HGB 10.9* 11.0* 11.3* 11.5* 11.8*  HCT 31.2* 30.8* 31.6* 32.3* 33.3*  PLT 193 182 176 183 164  MCV 82.8 83.0 83.6 83.0 83.0  MCH 28.9 29.6 29.9 29.6 29.4  MCHC 34.9 35.7 35.8 35.6 35.4  RDW 12.2 12.3 12.2 12.2 12.1  LYMPHSABS 0.3* 0.4* 0.4* 0.2* 0.7  MONOABS 0.5 0.4 0.6 0.8 0.3  EOSABS 0.4 0.3 0.3 0.4 0.3  BASOSABS 0.0 0.0 0.0 0.0 0.0  BANDABS -- -- -- -- --    Chemistries   Lab 07/31/12 0550 07/30/12 0415 07/29/12 0430 07/28/12 0405 07/27/12 0526 07/24/12 2310  NA 133* 134* 136 137 136 --  K 3.6 3.6 3.7 3.4* 3.4* --  CL 99 100 101 101 100 --  CO2 26 25 26 28 27  --  GLUCOSE 90 89 77 78 103* --  BUN <3* <3* <3* <3* <3* --  CREATININE 0.82 0.86 0.85 0.81 0.66 --  CALCIUM 8.8 8.7 8.7 8.5 8.7 --  MG 1.5 1.5 1.6 -- -- --  AST -- -- -- -- -- 17  ALT -- -- -- -- --  21  ALKPHOS -- -- -- -- -- 74  BILITOT -- -- -- -- -- 0.4   ------------------------------------------------------------------------------------------------------------------ estimated creatinine clearance is 118 ml/min (by C-G formula based on Cr of 0.82). ------------------------------------------------------------------------------------------------------------------ No results found for this basename: HGBA1C:2 in the last 72 hours ------------------------------------------------------------------------------------------------------------------ No results found  for this basename: CHOL:2,HDL:2,LDLCALC:2,TRIG:2,CHOLHDL:2,LDLDIRECT:2 in the last 72 hours ------------------------------------------------------------------------------------------------------------------ No results found for this basename: TSH,T4TOTAL,FREET3,T3FREE,THYROIDAB in the last 72 hours ------------------------------------------------------------------------------------------------------------------ No results found for this basename: VITAMINB12:2,FOLATE:2,FERRITIN:2,TIBC:2,IRON:2,RETICCTPCT:2 in the last 72 hours  Coagulation profile No results found for this basename: INR:5,PROTIME:5 in the last 168 hours  No results found for this basename: DDIMER:2 in the last 72 hours  Cardiac Enzymes No results found for this basename: CK:3,CKMB:3,TROPONINI:3,MYOGLOBIN:3 in the last 168 hours ------------------------------------------------------------------------------------------------------------------ No components found with this basename: POCBNP:3

## 2012-07-31 NOTE — Progress Notes (Addendum)
Patient ID: [name], female   DOB: 1981-06-30, 31 y.o.   MRN: [id number]    [facility]    Date of Admission:  07/24/2012   Total days of antibiotics 7        Day 5 trimethoprim sulfamethoxazole         Active Problems:  Benign essential HTN  DM II (diabetes mellitus, type II), controlled  Eczema  Muscle spasm of back  Hodgkin disease  Fever  Community acquired pneumonia  Hodgkin's lymphoma  Cough, persistent  Pulmonary infiltrates      . enoxaparin (LOVENOX) injection  40 mg Subcutaneous Q24H  . insulin aspart  0-15 Units Subcutaneous TID WC  . insulin aspart  0-5 Units Subcutaneous QHS  . lidocaine  1 patch Transdermal Q24H  . metoprolol tartrate  25 mg Oral BID  . saccharomyces boulardii  250 mg Oral BID  . sucralfate  1 g Oral TID AC & HS  . sulfamethoxazole-trimethoprim  450 mg Intravenous Q8H  . DISCONTD: amLODipine  5 mg Oral Daily    Subjective: She is feeling much better. She has mild residual dry cough but this has improved. Her dyspnea on exertion has improved. She has not had any more diarrhea today but she does have problems with nausea.  Objective: Temp:  [97.8 F (36.6 C)-98.9 F (37.2 C)] 98.4 F (36.9 C) (09/03 1358) Pulse Rate:  [63-65] 63  (09/03 1358) Resp:  [18-20] 20  (09/03 1358) BP: (97-115)/(45-64) 97/59 mmHg (09/03 1358) SpO2:  [95 %-100 %] 95 % (09/03 1358)  General: She is smiling and appears comfortable Skin: No rash. Port-A-Cath site appears normal Lungs: Clear Cor: Regular S1-S2 without murmurs Abdomen: Nontender  Lab Results Lab Results  Component Value Date   WBC 1.9* 07/31/2012   HGB 10.9* 07/31/2012   HCT 31.2* 07/31/2012   MCV 82.8 07/31/2012   PLT 193 07/31/2012    Lab Results  Component Value Date   CREATININE 0.82 07/31/2012   BUN <3* 07/31/2012   NA 133* 07/31/2012   K 3.6 07/31/2012   CL 99 07/31/2012   CO2 26 07/31/2012    Lab Results  Component Value Date   ALT 21 07/24/2012   AST 17  07/24/2012   ALKPHOS 74 07/24/2012   BILITOT 0.4 07/24/2012      Microbiology: Recent Results (from the past 240 hour(s))  URINE CULTURE     Status: Normal   Collection Time   07/24/12 10:59 PM      Component Value Range Status Comment   Specimen Description URINE, CLEAN CATCH   Final    Special Requests Immunocompromised   Final    Culture  Setup Time 07/25/2012 05:50   Final    Colony Count >=100,000 COLONIES/ML   Final    Culture     Final    Value: Multiple bacterial morphotypes present, none predominant. Suggest appropriate recollection if clinically indicated.   Report Status 07/26/2012 FINAL   Final   CULTURE, BLOOD (ROUTINE X 2)     Status: Normal   Collection Time   07/24/12 11:10 PM      Component Value Range Status Comment   Specimen Description BLOOD RIGHT ANTECUBITAL   Final    Special Requests BOTTLES DRAWN AEROBIC AND ANAEROBIC 4CC   Final    Culture  Setup Time 07/25/2012 04:36   Final    Culture NO GROWTH 5 DAYS   Final    Report Status 07/31/2012 FINAL  Final   CULTURE, BLOOD (ROUTINE X 2)     Status: Normal   Collection Time   07/24/12 11:20 PM      Component Value Range Status Comment   Specimen Description BLOOD NO SITE INDICATED   Final    Special Requests     Final    Value: BOTTLES DRAWN AEROBIC AND ANAEROBIC NO VOLUME INDICATED   Culture  Setup Time 07/25/2012 04:36   Final    Culture NO GROWTH 5 DAYS   Final    Report Status 07/31/2012 FINAL   Final   CULTURE, BLOOD (SINGLE)     Status: Normal   Collection Time   07/25/12  6:30 AM      Component Value Range Status Comment   Specimen Description BLOOD PORTA CATH FROM MEDIPORT   Final    Special Requests BOTTLES DRAWN AEROBIC AND ANAEROBIC 10CC EACH   Final    Culture  Setup Time 07/25/2012 11:00   Final    Culture NO GROWTH 5 DAYS   Final    Report Status 07/31/2012 FINAL   Final   CULTURE, BLOOD (ROUTINE X 2)     Status: Normal (Preliminary result)   Collection Time   07/27/12  8:00 AM       Component Value Range Status Comment   Specimen Description BLOOD RIGHT HAND   Final    Special Requests BOTTLES DRAWN AEROBIC AND ANAEROBIC 7CC   Final    Culture  Setup Time 07/27/2012 12:29   Final    Culture     Final    Value:        BLOOD CULTURE RECEIVED NO GROWTH TO DATE CULTURE WILL BE HELD FOR 5 DAYS BEFORE ISSUING A FINAL NEGATIVE REPORT   Report Status PENDING   Incomplete   CULTURE, BLOOD (ROUTINE X 2)     Status: Normal (Preliminary result)   Collection Time   07/27/12 11:05 AM      Component Value Range Status Comment   Specimen Description BLOOD LEFT HAND   Final    Special Requests BOTTLES DRAWN AEROBIC AND ANAEROBIC [facility]   Final    Culture  Setup Time 07/27/2012 15:02   Final    Culture     Final    Value:        BLOOD CULTURE RECEIVED NO GROWTH TO DATE CULTURE WILL BE HELD FOR 5 DAYS BEFORE ISSUING A FINAL NEGATIVE REPORT   Report Status PENDING   Incomplete   CLOSTRIDIUM DIFFICILE BY PCR     Status: Normal   Collection Time   07/30/12  3:54 PM      Component Value Range Status Comment   C difficile by pcr NEGATIVE  NEGATIVE Final     Studies/Results: No results found.  Assessment: She is responding well to empiric therapy for pneumonia. She is complaining of nausea today so I will hold off of converting to trimethoprim sulfamethoxazole to oral form. If that has improved [name] I would agree with changing to oral and sending her home to complete 3 weeks of therapy for possible pneumocystis pneumonia.  Plan: 1. Consider conversion to oral trimethoprim sulfamethoxazole tomorrow, 2 double strength tablets 3 times daily for 16 more days and  Mary Asters, MD [facility] for [facility] [phone] pager   [phone] cell 07/31/2012, 2:22 PM
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
## 2012-07-31 NOTE — Progress Notes (Signed)
She continues to improve Now afebrile x 24 hours Cough decreasing Diarrhea resolved C difficile negative Levoquin stopped Day # 6 Septra Lungs remain clear Impression: Opportunistic pneumonia in Hodgkin's patient - significant improvement with empiric course of IV Septra Recommend:  I think we can defer bronchoscopy. I would vote to change to PO Septra and let her go home Will await final rec from ID & Pulmonary

## 2012-08-01 ENCOUNTER — Encounter (HOSPITAL_COMMUNITY): Payer: Self-pay | Admitting: Oncology

## 2012-08-01 ENCOUNTER — Encounter (HOSPITAL_COMMUNITY): Payer: Medicaid Other

## 2012-08-01 ENCOUNTER — Encounter (HOSPITAL_COMMUNITY)
Admission: EM | Disposition: A | Discharge status: Home / Self Care | Payer: Self-pay | Source: Home / Self Care | Attending: Family Medicine

## 2012-08-01 DIAGNOSIS — D72819 Decreased white blood cell count, unspecified: Secondary | ICD-10-CM | POA: Diagnosis not present

## 2012-08-01 DIAGNOSIS — E119 Type 2 diabetes mellitus without complications: Secondary | ICD-10-CM

## 2012-08-01 HISTORY — DX: Decreased white blood cell count, unspecified: D72.819

## 2012-08-01 LAB — GLUCOSE, CAPILLARY
Glucose-Capillary: 112 mg/dL — ABNORMAL HIGH (ref 70–99)
Glucose-Capillary: 143 mg/dL — ABNORMAL HIGH (ref 70–99)
Glucose-Capillary: 116 mg/dL — ABNORMAL HIGH (ref 70–99)
Glucose-Capillary: 68 mg/dL — ABNORMAL LOW (ref 70–99)
Glucose-Capillary: 71 mg/dL (ref 70–99)
Glucose-Capillary: 69 mg/dL — ABNORMAL LOW (ref 70–99)

## 2012-08-01 LAB — BASIC METABOLIC PANEL
BUN: <3 mg/dL — ABNORMAL LOW (ref 6–23)
CO2: 26 mEq/L (ref 19–32)
Calcium: 8.7 mg/dL (ref 8.4–10.5)
Chloride: 100 mEq/L (ref 96–112)
Creatinine, Ser: 0.8 mg/dL (ref 0.50–1.10)
Glucose, Bld: 109 mg/dL — ABNORMAL HIGH (ref 70–99)

## 2012-08-01 LAB — CBC WITH DIFFERENTIAL/PLATELET
Basophils Relative: 1 % (ref 0–1)
Eosinophils Absolute: 0.3 10*3/uL (ref 0.0–0.7)
Hemoglobin: 10.6 g/dL — ABNORMAL LOW (ref 12.0–15.0)
Lymphs Abs: 0.3 10*3/uL — ABNORMAL LOW (ref 0.7–4.0)
MCH: 29.3 pg (ref 26.0–34.0)
MCHC: 35.6 g/dL (ref 30.0–36.0)
MCV: 82.3 fL (ref 78.0–100.0)
Monocytes Absolute: 0.4 10*3/uL (ref 0.1–1.0)
Neutro Abs: 0.8 10*3/uL — ABNORMAL LOW (ref 1.7–7.7)
Neutrophils Relative %: 40 % — ABNORMAL LOW (ref 43–77)

## 2012-08-01 SURGERY — BRONCHOSCOPY, WITH FLUOROSCOPY
Anesthesia: Moderate Sedation | Laterality: Bilateral

## 2012-08-01 MED ORDER — SULFAMETHOXAZOLE-TMP DS 800-160 MG PO TABS
2.0000 | ORAL_TABLET | Freq: Three times a day (TID) | ORAL | Status: DC
  Administered 2012-08-01 – 2012-08-02 (×3): 2 via ORAL
  Filled 2012-08-01 (×5): qty 2

## 2012-08-01 MED ORDER — BOOST PLUS PO LIQD
237.0000 mL | Freq: Two times a day (BID) | ORAL | Status: DC
  Administered 2012-08-02: 237 mL via ORAL
  Filled 2012-08-01 (×3): qty 237

## 2012-08-01 MED ORDER — POTASSIUM CHLORIDE CRYS ER 20 MEQ PO TBCR
40.0000 meq | EXTENDED_RELEASE_TABLET | Freq: Once | ORAL | Status: AC
  Administered 2012-08-01: 40 meq via ORAL
  Filled 2012-08-01: qty 2

## 2012-08-01 MED ORDER — BOOST / RESOURCE BREEZE PO LIQD
1.0000 | Freq: Every day | ORAL | Status: DC
  Filled 2012-08-01 (×2): qty 1

## 2012-08-01 NOTE — Progress Notes (Signed)
INITIAL ADULT NUTRITION ASSESSMENT Date: 08/01/2012   Time: 2:22 PM Reason for Assessment: Nutrition Risk   ASSESSMENT: Female 31 y.o.  Dx: Fevers  INTERVENTION: 1. I have educated the patient on nutritional symptom management for metallic taste. I have encouraged the patient to try protein powder and Ensure Clear in addition to Boost and Glucerna nutrition supplements to avoid becoming tired of Boost and provide additional supplement options. I have encouraged the patient to try bland foods and eat small frequent meals throughout the day.  2. Will order the patient Boost nutrition supplement BID, provides 720 kcal and 28 grams of protein daily.  3. Will order patient Resource Breeze nutrition supplement once daily, provides 250 kcal and 9 grams of protein daily.  4. RD to follow for nutrition plan of care.   Hx:  Past Medical History  Diagnosis Date  . Hypertension   . Diabetes mellitus   . Complication of anesthesia SLOW TO WAKE UP AFTER C SECTION  . Hodgkin lymphoma 01/18/2012  . Benign essential HTN 01/19/2012  . DM II (diabetes mellitus, type II), controlled 01/19/2012  . Eczema 01/19/2012  . Muscle spasm of back 03/09/2012  . hodgkins lymphoma dx'd 01/16/12    lt axilla and left neck   . S/P chemotherapy, time since 4-12 weeks 05/11/2012    4 cycles of ABVD with neulasta suppor and zoladex  . Asthma     seasonal  . Cough, persistent 07/27/2012  . Leukopenia 08/01/2012    Related Meds:  Scheduled Meds:   . enoxaparin (LOVENOX) injection  40 mg Subcutaneous Q24H  . fluconazole  200 mg Oral Once  . insulin aspart  0-15 Units Subcutaneous TID WC  . insulin aspart  0-5 Units Subcutaneous QHS  . lidocaine  1 patch Transdermal Q24H  . metoprolol tartrate  25 mg Oral BID  . potassium chloride  40 mEq Oral Once  . saccharomyces boulardii  250 mg Oral BID  . sucralfate  1 g Oral TID AC & HS  . sulfamethoxazole-trimethoprim  2 tablet Oral TID  . DISCONTD:  sulfamethoxazole-trimethoprim  450 mg Intravenous Q8H   Continuous Infusions:  PRN Meds:.acetaminophen, alum & mag hydroxide-simeth, baclofen, guaiFENesin-dextromethorphan, HYDROcodone-acetaminophen, hydrocortisone cream, LORazepam, morphine injection, ondansetron (ZOFRAN) IV, ondansetron, sodium chloride, zolpidem   Ht: 5\' 7"  (170.2 cm)  Wt: 199 lb 11.8 oz (90.6 kg)  Ideal Wt: 61.36 kg % Ideal Wt: 147% Wt Readings from Last 10 Encounters:  07/25/12 199 lb 11.8 oz (90.6 kg)  07/25/12 199 lb 11.8 oz (90.6 kg)  07/16/12 199 lb 9.6 oz (90.538 kg)  07/04/12 211 lb 6.4 oz (95.89 kg)  07/02/12 212 lb 8 oz (96.389 kg)  06/29/12 222 lb 9.6 oz (100.971 kg)  06/26/12 222 lb 6.4 oz (100.88 kg)  06/06/12 219 lb (99.338 kg)  06/01/12 214 lb (97.07 kg)  04/27/12 216 lb 8 oz (98.204 kg)  *Unintentional weight loss of 23 lb over 1 month, 10.4% from baseline.   Usual Wt: 220 lb per pt 4-5 weeks ago % Usual Wt: 89.6%   Body mass index is 31.28 kg/(m^2). (Obesity class 1)  Food/Nutrition Related Hx: Patient reported she has had no appetite for the past month. She reported all foods taste too salty or like metal. She reported she drinks Boost and glucerna at home. PO intake documented 75% at meals.   Labs:  CMP     Component Value Date/Time   NA 134* 08/01/2012 0640   NA 139 05/28/2012 0818  K 3.7 08/01/2012 0640   K 4.6 05/28/2012 0818   CL 100 08/01/2012 0640   CL 95* 05/28/2012 0818   CO2 26 08/01/2012 0640   CO2 28 05/28/2012 0818   GLUCOSE 109* 08/01/2012 0640   GLUCOSE 153* 05/28/2012 0818   BUN <3* 08/01/2012 0640   BUN 11 05/28/2012 0818   CREATININE 0.80 08/01/2012 0640   CREATININE 0.8 05/28/2012 0818   CREATININE 0.67 10/08/2007 1250   CALCIUM 8.7 08/01/2012 0640   CALCIUM 9.3 05/28/2012 0818   PROT 7.6 07/24/2012 2310   PROT 7.3 05/28/2012 0818   ALBUMIN 3.8 07/24/2012 2310   AST 17 07/24/2012 2310   AST 24 05/28/2012 0818   ALT 21 07/24/2012 2310   ALKPHOS 74 07/24/2012 2310   ALKPHOS 71 05/28/2012 0818     BILITOT 0.4 07/24/2012 2310   BILITOT 0.60 05/28/2012 0818   GFRNONAA >90 08/01/2012 0640   GFRAA >90 08/01/2012 0640   No intake or output data in the 24 hours ending 08/01/12 1427   Diet Order: General  Supplements/Tube Feeding: none at this time  IVF:    Estimated Nutritional Needs:   Kcal: 2000-2261 Protein: 109-135 grams  Fluid: 1 ml per kcal intake   NUTRITION DIAGNOSIS: -Increased nutrient needs (NI-5.1).  Status: Ongoing  RELATED TO: diagnosis of cancer and related treatment symptoms  AS EVIDENCE BY: increased calorie and protein needs to promote weight maintenance/ prevent further weight loss.   MONITORING/EVALUATION(Goals): PO intake, weights, labs 1. PO intake > 75% at meals and supplements.  2. Minimize weight loss  EDUCATION NEEDS: -Education needs addressed.   INTERVENTION: 1. I have educated the patient on nutritional symptom management for metallic taste. I have encouraged the patient to try protein powder and Ensure Clear in addition to Boost and Glucerna nutrition supplements to avoid becoming tired of Boost and provide additional supplement options. I have encouraged the patient to try bland foods and eat small frequent meals throughout the day.  2. Will order the patient Boost nutrition supplement BID, provides 720 kcal and 28 grams of protein daily.  3. Will order patient Resource Breeze nutrition supplement once daily, provides 250 kcal and 9 grams of protein daily.  4. RD to follow for nutrition plan of care.   Dietitian [phone]  DOCUMENTATION CODES Per approved criteria  -Severe malnutrition in the context of chronic illness  *Patient meets malnutrition criteria due to 10.4% weight loss from baseline over 1 month and PO intake reported meets < or equal to 75% of estimated energy needs over 1 month.   Iven Finn [facility] 08/01/2012, 2:22 PM
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
## 2012-08-01 NOTE — Progress Notes (Signed)
Now afebrile x 48 hours on Septra alone day #7 CXR stable but CXR never showed much compared with CT findings from the outset of this infection. WBC continues to fall 1,800 40% neutrophils  Medication effect? She is clearly improving with decreased cough and resolution of fevers I am not sure what the yield of bronchoscopy is for partially Rxd PCP. I will defer to ID & Pulmonary. We could start oral Bactrim in hospital to assess tolerance then if tolerated, let her go home. I will get a follow up CT as an outpatient in a few weeks.

## 2012-08-01 NOTE — Progress Notes (Signed)
Patient ID: [name], female   DOB: 1981/02/05, 31 y.o.   MRN: [id number]    [facility]    Date of Admission:  07/24/2012   Total days of antibiotics 8        Day 6 trimethoprim sulfamethoxazole         Active Problems:  Benign essential HTN  DM II (diabetes mellitus, type II), controlled  Eczema  Muscle spasm of back  Hodgkin disease  Fever  Community acquired pneumonia  Hodgkin's lymphoma  Cough, persistent  Pulmonary infiltrates  Leukopenia      . enoxaparin (LOVENOX) injection  40 mg Subcutaneous Q24H  . feeding supplement  1 Container Oral QPC supper  . fluconazole  200 mg Oral Once  . insulin aspart  0-15 Units Subcutaneous TID WC  . insulin aspart  0-5 Units Subcutaneous QHS  . lactose free nutrition  237 mL Oral BID BM  . lidocaine  1 patch Transdermal Q24H  . metoprolol tartrate  25 mg Oral BID  . potassium chloride  40 mEq Oral Once  . saccharomyces boulardii  250 mg Oral BID  . sucralfate  1 g Oral TID AC & HS  . sulfamethoxazole-trimethoprim  2 tablet Oral TID  . DISCONTD: sulfamethoxazole-trimethoprim  450 mg Intravenous Q8H    Subjective: She is feeling better with less cough and shortness of breath. She has not had any further nausea today. She is very eager to go home to be with her 65-year-old daughter who started pre-kindergarten classes today.  Objective: Temp:  [98.3 F (36.8 C)-98.6 F (37 C)] 98.4 F (36.9 C) (09/04 1454) Pulse Rate:  [64-72] 72  (09/04 1454) Resp:  [20] 20  (09/04 1454) BP: (115-132)/(67-84) 124/84 mmHg (09/04 1454) SpO2:  [96 %-100 %] 99 % (09/04 1454)  General: She is smiling and in good spirits Skin: No rash Lungs: Clear Cor: Regular S1 and S2 no murmurs  Lab Results Lab Results  Component Value Date   WBC 1.8* 08/01/2012   HGB 10.6* 08/01/2012   HCT 29.8* 08/01/2012   MCV 82.3 08/01/2012   PLT 190 08/01/2012    Lab Results  Component Value Date   CREATININE 0.80 08/01/2012   BUN <3*  08/01/2012   NA 134* 08/01/2012   K 3.7 08/01/2012   CL 100 08/01/2012   CO2 26 08/01/2012    Lab Results  Component Value Date   ALT 21 07/24/2012   AST 17 07/24/2012   ALKPHOS 74 07/24/2012   BILITOT 0.4 07/24/2012      Microbiology: Recent Results (from the past 240 hour(s))  URINE CULTURE     Status: Normal   Collection Time   07/24/12 10:59 PM      Component Value Range Status Comment   Specimen Description URINE, CLEAN CATCH   Final    Special Requests Immunocompromised   Final    Culture  Setup Time 07/25/2012 05:50   Final    Colony Count >=100,000 COLONIES/ML   Final    Culture     Final    Value: Multiple bacterial morphotypes present, none predominant. Suggest appropriate recollection if clinically indicated.   Report Status 07/26/2012 FINAL   Final   CULTURE, BLOOD (ROUTINE X 2)     Status: Normal   Collection Time   07/24/12 11:10 PM      Component Value Range Status Comment   Specimen Description BLOOD RIGHT ANTECUBITAL   Final    Special Requests BOTTLES  DRAWN AEROBIC AND ANAEROBIC 4CC   Final    Culture  Setup Time 07/25/2012 04:36   Final    Culture NO GROWTH 5 DAYS   Final    Report Status 07/31/2012 FINAL   Final   CULTURE, BLOOD (ROUTINE X 2)     Status: Normal   Collection Time   07/24/12 11:20 PM      Component Value Range Status Comment   Specimen Description BLOOD NO SITE INDICATED   Final    Special Requests     Final    Value: BOTTLES DRAWN AEROBIC AND ANAEROBIC NO VOLUME INDICATED   Culture  Setup Time 07/25/2012 04:36   Final    Culture NO GROWTH 5 DAYS   Final    Report Status 07/31/2012 FINAL   Final   CULTURE, BLOOD (SINGLE)     Status: Normal   Collection Time   07/25/12  6:30 AM      Component Value Range Status Comment   Specimen Description BLOOD PORTA CATH FROM MEDIPORT   Final    Special Requests BOTTLES DRAWN AEROBIC AND ANAEROBIC 10CC EACH   Final    Culture  Setup Time 07/25/2012 11:00   Final    Culture NO GROWTH 5 DAYS   Final    Report  Status 07/31/2012 FINAL   Final   CULTURE, BLOOD (ROUTINE X 2)     Status: Normal (Preliminary result)   Collection Time   07/27/12  8:00 AM      Component Value Range Status Comment   Specimen Description BLOOD RIGHT HAND   Final    Special Requests BOTTLES DRAWN AEROBIC AND ANAEROBIC 7CC   Final    Culture  Setup Time 07/27/2012 12:29   Final    Culture     Final    Value:        BLOOD CULTURE RECEIVED NO GROWTH TO DATE CULTURE WILL BE HELD FOR 5 DAYS BEFORE ISSUING A FINAL NEGATIVE REPORT   Report Status PENDING   Incomplete   CULTURE, BLOOD (ROUTINE X 2)     Status: Normal (Preliminary result)   Collection Time   07/27/12 11:05 AM      Component Value Range Status Comment   Specimen Description BLOOD LEFT HAND   Final    Special Requests BOTTLES DRAWN AEROBIC AND ANAEROBIC [facility]   Final    Culture  Setup Time 07/27/2012 15:02   Final    Culture     Final    Value:        BLOOD CULTURE RECEIVED NO GROWTH TO DATE CULTURE WILL BE HELD FOR 5 DAYS BEFORE ISSUING A FINAL NEGATIVE REPORT   Report Status PENDING   Incomplete   CLOSTRIDIUM DIFFICILE BY PCR     Status: Normal   Collection Time   07/30/12  3:54 PM      Component Value Range Status Comment   C difficile by pcr NEGATIVE  NEGATIVE Final     Studies/Results: Dg Chest 2 View  07/31/2012  *RADIOLOGY REPORT*  Clinical Data: Lung infiltrates, history of Hodgkin's lymphoma  CHEST - 2 VIEW  Comparison: Chest x-ray of 07/28/2012  Findings: Patchy lung infiltrates and somewhat coarse lung markings are suspicious for bilateral pneumonia.  A Port-A-Cath is present with the tip overlying the mid SVC.  No bony abnormality is seen.  IMPRESSION: Patchy lung infiltrates remain most consistent with bilateral pneumonia.  No effusion.   Original Report Authenticated By: Juline Patch, M.D.  Assessment: Her pneumonia is responding well to empiric therapy for community-acquired pathogens and the possibility of pneumocystis. I agree with converting  trimethoprim sulfamethoxazole to oral form and treating for 15 more days. She has leukopenia which could be in part related to trimethoprim sulfamethoxazole but I would simply monitor this while on therapy. As long as she has complete resolution of her symptoms I do not feel strongly that she needs a followup CT scan.  Plan: 1. Continue oral trimethoprim sulfamethoxazole 2 double strength tablets 3 times daily through September 19 2. Please call if I can be of further assistance  Cliffton Asters, MD [facility] [phone] pager   [phone] cell 08/01/2012, 4:30 PM

## 2012-08-01 NOTE — Progress Notes (Signed)
[facility] PROGRESS NOTE  PAELYN French UJW:[id number] DOB: [birth year] DOA: 07/24/2012 PCP: Dorrene German, MD  Assessment/Plan: Active Problems:  Benign essential HTN  DM II (diabetes mellitus, type II), controlled  Eczema  Muscle spasm of back  Hodgkin disease  Fever  Community acquired pneumonia  Hodgkin's lymphoma  Cough, persistent  Pulmonary infiltrates  Leukopenia   Bilateral pulmonary infiltrates -Likely bilateral pneumonia, cannot rule out PCP. -Being treated with broad-spectrum antibiotics initially. ID switched to IV Bactrim and Levaquin. -Currently she is only on Bactrim, no fever or cough. Improving steadily. -Initial plans for bronchoscopy this afternoon, canceled after it was thought to be low yield secondary to patient taking antibiotic for the past 8 days.  History of Stage IIB Hodgkin lymphoma. -She has nodular sclerosing Hodgkin's lymphoma, she recently completed chemotherapy. -She follow with Dr. Cyndie Chime outpatient  Diabetes mellitus type 2 -Patient is on metformin as outpatient, continue current regimen. -Blood glucose she is very controlled, she is on insulin sliding scale.  Hypertension -She is on Norvasc, this is been on hold because of blood pressures in the low side.  Code Status: Full Family Communication: Disposition Plan: Home when she is  medical stable   Brief narrative: 31 year old African American female with past medical history of stage IIB Hodgkin lymphoma, treated with chemotherapy and radiation. Patient came into the hospital with bilateral pulmonary infiltrates and fever. Patient is being treated as pneumonia with antibiotics, currently down to Bactrim only.  Consultants:  Cephas Darby of hematology/oncology.  Cliffton Asters of infectious disease.  Levy Pupa of pulmonology  Procedures:  None  Antibiotics:  Bactrim  HPI/Subjective: Feels much better, no fever cough and  Objective: Filed  Vitals:   07/31/12 0503 07/31/12 1358 07/31/12 1920 08/01/12 0626  BP: 98/45 97/59 132/67 115/78  Pulse: 65 63 68 64  Temp: 97.8 F (36.6 C) 98.4 F (36.9 C) 98.6 F (37 C) 98.3 F (36.8 C)  TempSrc: Oral Oral Oral Oral  Resp: 20 20 20 20   Height:      Weight:      SpO2: 100% 95% 100% 96%   No intake or output data in the 24 hours ending 08/01/12 1136 Filed Weights   07/24/12 2038 07/25/12 0830  Weight: 86.183 kg (190 lb) 90.6 kg (199 lb 11.8 oz)    Exam: General: Alert and awake, oriented x3, not in any acute distress. HEENT: anicteric sclera, pupils reactive to light and accommodation, EOMI CVS: S1-S2 clear, no murmur rubs or gallops Chest: clear to auscultation bilaterally, no wheezing, rales or rhonchi Abdomen: soft nontender, nondistended, normal bowel sounds, no organomegaly Extremities: no cyanosis, clubbing or edema noted bilaterally Neuro: Cranial nerves II-XII intact, no focal neurological deficits  Data Reviewed: Basic Metabolic Panel:  Lab 08/01/12 5621 07/31/12 0550 07/30/12 0415 07/29/12 0430 07/28/12 0405  NA 134* 133* 134* 136 137  K 3.7 3.6 3.6 3.7 3.4*  CL 100 99 100 101 101  CO2 26 26 25 26 28   GLUCOSE 109* 90 89 77 78  BUN <3* <3* <3* <3* <3*  CREATININE 0.80 0.82 0.86 0.85 0.81  CALCIUM 8.7 8.8 8.7 8.7 8.5  MG 1.6 1.5 1.5 1.6 --  PHOS -- -- -- -- --   Liver Function Tests: No results found for this basename: AST:5,ALT:5,ALKPHOS:5,BILITOT:5,PROT:5,ALBUMIN:5 in the last 168 hours No results found for this basename: LIPASE:5,AMYLASE:5 in the last 168 hours No results found for this basename: AMMONIA:5 in the last 168 hours CBC:  Lab 08/01/12 0640  07/31/12 0550 07/30/12 0415 07/29/12 0430 07/28/12 0405  WBC 1.8* 1.9* 2.2* 2.3* 2.5*  NEUTROABS 0.8* 0.7* 1.0* 1.1* 1.1*  HGB 10.6* 10.9* 11.0* 11.3* 11.5*  HCT 29.8* 31.2* 30.8* 31.6* 32.3*  MCV 82.3 82.8 83.0 83.6 83.0  PLT 190 193 182 176 183   Cardiac Enzymes: No results found for this  basename: CKTOTAL:5,CKMB:5,CKMBINDEX:5,TROPONINI:5 in the last 168 hours BNP (last 3 results) No results found for this basename: PROBNP:3 in the last 8760 hours CBG:  Lab 07/31/12 2121 07/31/12 1700 07/31/12 1157 07/31/12 0730 07/30/12 2132  GLUCAP 77 91 95 101* 80    Recent Results (from the past 240 hour(s))  URINE CULTURE     Status: Normal   Collection Time   07/24/12 10:59 PM      Component Value Range Status Comment   Specimen Description URINE, CLEAN CATCH   Final    Special Requests Immunocompromised   Final    Culture  Setup Time 07/25/2012 05:50   Final    Colony Count >=100,000 COLONIES/ML   Final    Culture     Final    Value: Multiple bacterial morphotypes present, none predominant. Suggest appropriate recollection if clinically indicated.   Report Status 07/26/2012 FINAL   Final   CULTURE, BLOOD (ROUTINE X 2)     Status: Normal   Collection Time   07/24/12 11:10 PM      Component Value Range Status Comment   Specimen Description BLOOD RIGHT ANTECUBITAL   Final    Special Requests BOTTLES DRAWN AEROBIC AND ANAEROBIC 4CC   Final    Culture  Setup Time 07/25/2012 04:36   Final    Culture NO GROWTH 5 DAYS   Final    Report Status 07/31/2012 FINAL   Final   CULTURE, BLOOD (ROUTINE X 2)     Status: Normal   Collection Time   07/24/12 11:20 PM      Component Value Range Status Comment   Specimen Description BLOOD NO SITE INDICATED   Final    Special Requests     Final    Value: BOTTLES DRAWN AEROBIC AND ANAEROBIC NO VOLUME INDICATED   Culture  Setup Time 07/25/2012 04:36   Final    Culture NO GROWTH 5 DAYS   Final    Report Status 07/31/2012 FINAL   Final   CULTURE, BLOOD (SINGLE)     Status: Normal   Collection Time   07/25/12  6:30 AM      Component Value Range Status Comment   Specimen Description BLOOD PORTA CATH FROM MEDIPORT   Final    Special Requests BOTTLES DRAWN AEROBIC AND ANAEROBIC 10CC EACH   Final    Culture  Setup Time 07/25/2012 11:00   Final     Culture NO GROWTH 5 DAYS   Final    Report Status 07/31/2012 FINAL   Final   CULTURE, BLOOD (ROUTINE X 2)     Status: Normal (Preliminary result)   Collection Time   07/27/12  8:00 AM      Component Value Range Status Comment   Specimen Description BLOOD RIGHT HAND   Final    Special Requests BOTTLES DRAWN AEROBIC AND ANAEROBIC [facility]   Final    Culture  Setup Time 07/27/2012 12:29   Final    Culture     Final    Value:        BLOOD CULTURE RECEIVED NO GROWTH TO DATE CULTURE WILL BE HELD FOR 5 DAYS BEFORE  ISSUING A FINAL NEGATIVE REPORT   Report Status PENDING   Incomplete   CULTURE, BLOOD (ROUTINE X 2)     Status: Normal (Preliminary result)   Collection Time   07/27/12 11:05 AM      Component Value Range Status Comment   Specimen Description BLOOD LEFT HAND   Final    Special Requests BOTTLES DRAWN AEROBIC AND ANAEROBIC [facility]   Final    Culture  Setup Time 07/27/2012 15:02   Final    Culture     Final    Value:        BLOOD CULTURE RECEIVED NO GROWTH TO DATE CULTURE WILL BE HELD FOR 5 DAYS BEFORE ISSUING A FINAL NEGATIVE REPORT   Report Status PENDING   Incomplete   CLOSTRIDIUM DIFFICILE BY PCR     Status: Normal   Collection Time   07/30/12  3:54 PM      Component Value Range Status Comment   C difficile by pcr NEGATIVE  NEGATIVE Final      Studies: Dg Chest 2 View  07/31/2012  *RADIOLOGY REPORT*  Clinical Data: Lung infiltrates, history of Hodgkin's lymphoma  CHEST - 2 VIEW  Comparison: Chest x-ray of 07/28/2012  Findings: Patchy lung infiltrates and somewhat coarse lung markings are suspicious for bilateral pneumonia.  A Port-A-Cath is present with the tip overlying the mid SVC.  No bony abnormality is seen.  IMPRESSION: Patchy lung infiltrates remain most consistent with bilateral pneumonia.  No effusion.   Original Report Authenticated By: Juline Patch, M.D.    Dg Chest 2 View  07/28/2012  *RADIOLOGY REPORT*  Clinical Data: Follow up pneumonia.  History of Hodgkin's lymphoma.  CHEST -  2 VIEW  Comparison: CT chest yesterday.  Two-view chest x-ray yesterday, 829/2013, 07/24/2012.  PET CT 05/28/2012.  Findings: Patchy airspace opacities in both lungs, predominately the upper lobes, not significantly change since yesterday but worse than the examination 4 days ago.  No new pulmonary parenchymal abnormalities. Cardiomediastinal silhouette unremarkable, unchanged.  Right subclavian Port-A-Cath tip in the SVC. Visualized bony thorax intact.  IMPRESSION: Stable patchy airspace opacities throughout both lungs, particularly the upper lobes, when compared to yesterday, though increased since 4 days ago.   Original Report Authenticated By: Arnell Sieving, M.D.    Dg Chest 2 View  07/27/2012  *RADIOLOGY REPORT*  Clinical Data: Diabetes.  Hodgkin's lymphoma.  Pneumonia.  CHEST - 2 VIEW  Comparison: 07/26/2012  Findings: Indistinct airspace opacities noted in both lungs with slightly nodular elements, with overall burden greater on the left than the right, suspicious for bilateral pneumonia.  Power port catheter noted with tip projecting over the SVC. Cardiac and mediastinal contours appear unremarkable.  IMPRESSION:  1.  Stable indistinct airspace opacities with nodular elements. Appearance favors infection; given the nodular elements, entities such as fungal disease cannot be excluded.  Lymphomatous infiltration of the lung is considered less likely.   Original Report Authenticated By: Dellia Cloud, M.D.    Dg Chest 2 View  07/26/2012  *RADIOLOGY REPORT*  Clinical Data: Pneumonia, cough  CHEST - 2 VIEW  Comparison: 07/24/2012  Findings: Stable patchy/nodular opacities in the right mid lung and left upper lobe. No pleural effusion or pneumothorax.  Stable right chest power port.  The heart is normal in size.  IMPRESSION: Stable patchy/nodular opacities in the right midlung and left upper lobe.  Differential considerations remain pneumonia or lymphomatous involvement.  Follow-up radiographs  are suggested to assess for clearing.  Original Report Authenticated By: Charline Bills, M.D.    Dg Chest 2 View  07/24/2012  *RADIOLOGY REPORT*  Clinical Data: Fever, cough.  CHEST - 2 VIEW  Comparison: 05/28/2012 PET CT  Findings: Right chest wall Port-A-Cath tip projects over the proximal SVC.  Mild peripheral reticular nodular opacities.  No pleural effusion or pneumothorax.  No acute osseous finding. Cardiomediastinal contours within normal range.  IMPRESSION: Mild peripheral reticular nodular opacities may correspond to the nodules described on recent PET CT. The differential includes infection or metastatic disease.   Original Report Authenticated By: Waneta Martins, M.D.    Ct Chest W Contrast  07/27/2012  *RADIOLOGY REPORT*  Clinical Data: Immunocompromised patient with history of Hodgkin's lymphoma with infiltrates in the lungs and fever.  Cough.  CT CHEST WITH CONTRAST  Technique:  Multidetector CT imaging of the chest was performed following the standard protocol during bolus administration of intravenous contrast.  Contrast: 80mL OMNIPAQUE IOHEXOL 300 MG/ML  SOLN  Comparison: Chest x-ray 07/27/2012.  PET CT 05/28/2012.  Findings:  Mediastinum: Heart size is borderline enlarged. There is no significant pericardial fluid, thickening or pericardial calcification.  Again noted is some amorphous soft tissue in the anterior mediastinum, presumably lymph nodes, with the largest measuring up to 1.2 cm in short axis, similar to PET 05/28/2012. No other definite pathologically enlarged mediastinal or hilar lymph nodes are noted.  Esophagus is unremarkable in appearance. Right subclavian single lumen Port-A-Cath with tip terminating in the distal superior vena cava.  Lungs/Pleura: Compared to the prior examination the previously noted bilateral lower lobe opacities have largely resolved, with predominately interstitial opacities remaining in their wake.  In the left lower lobe in particular there are  areas of peripheral reticulation, with sparing of the immediate sub pleural lung; a nonspecific appearance that could suggest post infectious cryptogenic organizing pneumonia (COP).  In the mid and upper lungs there is new multifocal patchy nodular and mass-like ground-glass attenuation air space disease with some associated scattered septal thickening and diffuse bronchial wall thickening.  The overall appearance favors an atypical infection.  No pleural effusions.  Upper Abdomen: Focal perfusion anomaly in the liver adjacent to the falciform ligament (a common and benign finding) is incidentally noted.  Musculoskeletal: Left axillary adenopathy and soft tissue stranding is similar to recent PET examination, with the largest lymph node measuring up to 11 mm in short axis. There are no aggressive appearing lytic or blastic lesions noted in the visualized portions of the skeleton.  IMPRESSION: 1.  Markedly worsened patchy multifocal ground-glass attenuation air space disease and interstitial prominence in the mid and upper lungs bilaterally has an appearance most compatible with an atypical infection.  Differential considerations would include fungal etiologies, mycobacterial infection and other atypical organisms. 2.  The peripheral opacities in the lower lobes of the lungs noted on the prior examination have largely resolved, and there is now an appearance of the peripheral reticulation (particularly in the left lower lobe) with the sparing of the immediate sub pleural lung, which is nonspecific but may represent a manifestation of post infectious cryptogenic organizing pneumonia (COP). 3.  Additional incidental findings, as above, similar to prior examinations.   Original Report Authenticated By: Florencia Reasons, M.D.    Mr Lumbar Spine W Wo Contrast  07/25/2012  *RADIOLOGY REPORT*  Clinical Data: Low back pain, fever, and history of Hodgkin's lymphoma.  MRI LUMBAR SPINE WITHOUT AND WITH CONTRAST  Technique:   Multiplanar and multiecho pulse sequences of the lumbar  spine were obtained without and with intravenous contrast.  Contrast: 18mL MULTIHANCE GADOBENATE DIMEGLUMINE 529 MG/ML IV SOLN  Comparison: None.  Findings: Normal conus tip is at L2.  Normal paraspinal soft tissues.  The discs from T11-12 through L5-S1 are normal.  There is no facet arthritis, spinal stenosis, spondylolisthesis, or other abnormality.  There is no pathologic enhancement after contrast administration.  IMPRESSION: Normal MRI of the lumbar spine.   Original Report Authenticated By: Gwynn Burly, M.D.    US Renal  07/25/2012  *RADIOLOGY REPORT*  Clinical Data: Urinary retention.  Back pain.  History of diabetes and Hodgkin's lymphoma.  RENAL/URINARY TRACT ULTRASOUND COMPLETE  Comparison:  None.  Findings:  Right Kidney:  11.3 cm. Normal echotexture.  Normal central sinus echo complex.  No calculi or hydronephrosis.  Left Kidney:  12.5 cm. Normal echotexture.  Normal central sinus echo complex.  No calculi or hydronephrosis.  Bladder:  Normal.  IMPRESSION: Negative renal ultrasound.   Original Report Authenticated By: Andreas Newport, M.D.     Scheduled Meds:   . enoxaparin (LOVENOX) injection  40 mg Subcutaneous Q24H  . fluconazole  200 mg Oral Once  . insulin aspart  0-15 Units Subcutaneous TID WC  . insulin aspart  0-5 Units Subcutaneous QHS  . lidocaine  1 patch Transdermal Q24H  . metoprolol tartrate  25 mg Oral BID  . saccharomyces boulardii  250 mg Oral BID  . sucralfate  1 g Oral TID AC & HS  . sulfamethoxazole-trimethoprim  450 mg Intravenous Q8H   Continuous Infusions:   Active Problems:  Benign essential HTN  DM II (diabetes mellitus, type II), controlled  Eczema  Muscle spasm of back  Hodgkin disease  Fever  Community acquired pneumonia  Hodgkin's lymphoma  Cough, persistent  Pulmonary infiltrates  Leukopenia    Time spent: 35 minutes    [facility]  [facility] Pager [phone]. If  8PM-8AM, please contact night-coverage at [email or web address], password [email or web address] 08/01/2012, 11:36 AM  LOS: 8 days

## 2012-08-02 ENCOUNTER — Other Ambulatory Visit: Payer: Self-pay | Admitting: Oncology

## 2012-08-02 DIAGNOSIS — R110 Nausea: Secondary | ICD-10-CM

## 2012-08-02 DIAGNOSIS — C8190 Hodgkin lymphoma, unspecified, unspecified site: Secondary | ICD-10-CM

## 2012-08-02 DIAGNOSIS — D72819 Decreased white blood cell count, unspecified: Secondary | ICD-10-CM

## 2012-08-02 DIAGNOSIS — R918 Other nonspecific abnormal finding of lung field: Secondary | ICD-10-CM

## 2012-08-02 LAB — CULTURE, BLOOD (ROUTINE X 2): Culture: NO GROWTH

## 2012-08-02 LAB — CBC WITH DIFFERENTIAL/PLATELET
Basophils Relative: 1 % (ref 0–1)
Eosinophils Absolute: 0.3 10*3/uL (ref 0.0–0.7)
Eosinophils Relative: 17 % — ABNORMAL HIGH (ref 0–5)
Hemoglobin: 11.1 g/dL — ABNORMAL LOW (ref 12.0–15.0)
Lymphocytes Relative: 18 % (ref 12–46)
MCH: 29.3 pg (ref 26.0–34.0)
MCHC: 35.5 g/dL (ref 30.0–36.0)
Monocytes Absolute: 0.3 10*3/uL (ref 0.1–1.0)
Neutrophils Relative %: 48 % (ref 43–77)
Platelets: 231 10*3/uL (ref 150–400)
RBC: 3.79 MIL/uL — ABNORMAL LOW (ref 3.87–5.11)

## 2012-08-02 LAB — BASIC METABOLIC PANEL
Calcium: 8.8 mg/dL (ref 8.4–10.5)
GFR calc non Af Amer: >90 mL/min (ref >90)
Glucose, Bld: 79 mg/dL (ref 70–99)
Potassium: 3.7 mEq/L (ref 3.5–5.1)
Sodium: 134 mEq/L — ABNORMAL LOW (ref 135–145)

## 2012-08-02 LAB — MAGNESIUM: Magnesium: 1.7 mg/dL (ref 1.5–2.5)

## 2012-08-02 MED ORDER — SULFAMETHOXAZOLE-TMP DS 800-160 MG PO TABS: 2.0000 | ORAL_TABLET | Freq: Three times a day (TID) | ORAL | Status: DC

## 2012-08-02 MED ORDER — HEPARIN SOD (PORK) LOCK FLUSH 100 UNIT/ML IV SOLN
500.0000 [IU] | Freq: Once | INTRAVENOUS | Status: AC
  Administered 2012-08-02: 500 [IU] via INTRAVENOUS

## 2012-08-02 NOTE — Progress Notes (Signed)
She remains afebrile d # 7 Septra She was changed to PO Septra 9/4 Still with nausea but did not vomit Cough decreased Lungs remain clear to auscultation. Persistent leukopenia  WBC 2,000 ANC 1,000 23% monocytes Impression: #1.  Opportunistic pneumonia in Hodgkin's patient responding to empiric Rx for presumed PCP #2.  Leukopenia infection vs med related.  #3.  HTN #4.  DM type 2 Likely discharge today. Continue Septra through 9/19 per ID rec I will schedule follow up in my office Thanks

## 2012-08-02 NOTE — Progress Notes (Signed)
Patient discharged home in stable condition. Discharge instructions and teaching given to patient with verbal feedback and understanding.

## 2012-08-02 NOTE — Discharge Summary (Signed)
Physician Discharge Summary  [name] ZOX:[id number] DOB: October 16, 1981 DOA: 07/24/2012  PCP: Dorrene German, MD  Admit date: 07/24/2012 Discharge date: 08/02/2012  Recommendations for Outpatient Follow-up:  1. Followup with oncology and primary care physician. 2. Needs reevaluation for the bilateral lung infiltrates likely the CT scan in 2 to 3 weeks.  Discharge Diagnoses:  Active Problems:  Benign essential HTN  DM II (diabetes mellitus, type II), controlled  Eczema  Muscle spasm of back  Hodgkin disease  Fever  Community acquired pneumonia  Hodgkin's lymphoma  Cough, persistent  Pulmonary infiltrates  Leukopenia   Discharge Condition: Stable  Diet recommendation: Carbohydrate modified diet  Filed Weights   07/24/12 2038 07/25/12 0830  Weight: 86.183 kg (190 lb) 90.6 kg (199 lb 11.8 oz)    History of present illness:  [name] with a recent diagnosis of Hodgkin's lymphoma in January 2013. She completed her course of her chemotherapy in June and just completed her course of radiation therapy. She is a Mediport in the right chest wall. She presents to the ER because she's had severe dry mouth. She states it's been present from the chemotherapy but it is worse since last week. had scant urine output despite significant by mouth intake. She reports no burning or urination. Today at home her temperature was up to 101, in the ER it was as high as 102.5, with no clear source. Patient has been weak. She reports a dry cough but this has been present since her chemotherapy. She does report chills today. She denies any wheezing or shortness of breath. She does report some back pain in the lower back, this is new for the past 2 days. she describes this pain as constant. Today she does complain of pain at the site of her port. This occurred after her port was accessed. She states she normally receives numbing medication prior to port access, that was not done in the  ER. The patient looks good, however, with her immunocompromised status and current fever the hospital service was requested to observation the patient. History was provided by the patient and significant other at bedside   Hospital Course:   1. Bilateral pulmonary infiltrates: Bilateral pneumonia, treated clinically as Patient initially treated with broad-spectrum antibiotics including Zosyn, vancomycin and Levaquin. Then patient switched to IV Bactrim and Levaquin. Patient continued to improve slowly, so Levaquin was discontinued and patient was on Bactrim alone. She continued to improve without any fever chills, also her cough is improving. Infectious diseases recommend very high dose of the Bactrim to take for prolonged time. ID recommended 2 tablets of the Bactrim DS every 8 hours through September 19. While she was in the hospital pulmonology also was consulted to evaluate for possible fiberoptic bronchoscopy (FOB), this initially was scheduled, and then canceled because it was thought to be very low yield after patient received antibiotic for more than a week.  2. History of stage IIB Hodgkin lymphoma: Patient has nodular sclerosing Hodgkin lymphoma, she currently completed her chemotherapy regimen, she follows with Dr. Cyndie Chime as outpatient.  3. Diabetes mellitus type 2: Patient takes metformin as outpatient, this was held during this hospital stay. And restart at the time of discharge. Her blood sugar was controlled during this hospital stay.  4. Hypertension: This is chronic and stable condition, her medication was continued throughout the hospital stay.  5. Leukopenia: Patient presented to the hospital with total WBC of 3.6 and ANC of 2.1 on 07/24/2012. During this hospital  stay her WBCs 8 and nadir of 1.8 and her ANC went down to 0.8. Before discharge the counts were taken up, please see below for day of discharge labs. This is likely secondary to infection and/or the medication  effect.  Procedures:  None  Consultations:  [facility] the [facility].  Cliffton Asters of the infectious diseases service   Discharge Exam:  Filed Vitals:   08/01/12 0626 08/01/12 1454 08/01/12 2114 08/02/12 0530  BP: 115/78 124/84 130/81 121/75  Pulse: 64 72 70 59  Temp: 98.3 F (36.8 C) 98.4 F (36.9 C) 98.1 F (36.7 C) 98.1 F (36.7 C)  TempSrc: Oral Oral Oral Oral  Resp: 20 20 18 18   Height:      Weight:      SpO2: 96% 99% 98% 100%   General: Alert and awake, oriented x3, not in any acute distress. HEENT: anicteric sclera, pupils reactive to light and accommodation, EOMI CVS: S1-S2 clear, no murmur rubs or gallops Chest: clear to auscultation bilaterally, no wheezing, rales or rhonchi Abdomen: soft nontender, nondistended, normal bowel sounds, no organomegaly Extremities: no cyanosis, clubbing or edema noted bilaterally Neuro: Cranial nerves II-XII intact, no focal neurological deficits   Discharge Instructions  Discharge Orders    Future Appointments: Provider: Department: Dept Phone: Center:   08/16/2012 9:30 AM Lurline Hare, MD [facility] [phone] None     Future Orders Please Complete By Expires   Increase activity slowly        Medication List  As of 08/02/2012  2:58 PM   TAKE these medications         emollient cream   Commonly known as: BIAFINE   Apply 1 application topically 2 (two) times daily as needed. For rash.      ergocalciferol 50000 UNITS capsule   Commonly known as: VITAMIN D2   Take 50,000 Units by mouth every 7 (seven) days. Sunday      HYDROcodone-acetaminophen 7.5-500 MG/15ML solution   Commonly known as: LORTAB   Take 15 mLs by mouth every 6 (six) hours as needed. For pain.      hydrocortisone cream 1 %   Apply 1 application topically 2 (two) times daily as needed. For eczema      lisinopril-hydrochlorothiazide 20-12.5 MG per tablet   Commonly known as: PRINZIDE,ZESTORETIC   Take 1 tablet  by mouth daily.      LORazepam 2 MG tablet   Commonly known as: ATIVAN   Take 1 tablet (2 mg total) by mouth every 6 (six) hours as needed for anxiety (nausea or sleep). For anxiety      metFORMIN 500 MG tablet   Commonly known as: GLUCOPHAGE   Take 500 mg by mouth 2 (two) times daily with a meal.      ondansetron 8 MG disintegrating tablet   Commonly known as: ZOFRAN-ODT   Take 8 mg by mouth every 8 (eight) hours as needed. For nausea.      sucralfate 1 GM/10ML suspension   Commonly known as: CARAFATE   Take 10 mLs (1 g total) by mouth 4 (four) times daily.      sulfamethoxazole-trimethoprim 800-160 MG per tablet   Commonly known as: BACTRIM DS   Take 2 tablets by mouth 3 (three) times daily.      zolpidem 5 MG tablet   Commonly known as: AMBIEN   Take 5-10 mg by mouth at bedtime as needed. For sleep.  Follow-up Information    Follow up with Levert Feinstein, MD in 1 week.   Contact information:   [address] [place] [address] [phone]       Follow up with Dorrene German, MD in 1 week.   Contact information:   [address] [place] [address] [phone]           The results of significant diagnostics from this hospitalization (including imaging, microbiology, ancillary and laboratory) are listed below for reference.    Significant Diagnostic Studies: Dg Chest 2 View  07/31/2012  *RADIOLOGY REPORT*  Clinical Data: Lung infiltrates, history of Hodgkin's lymphoma  CHEST - 2 VIEW  Comparison: Chest x-ray of 07/28/2012  Findings: Patchy lung infiltrates and somewhat coarse lung markings are suspicious for bilateral pneumonia.  A Port-A-Cath is present with the tip overlying the mid SVC.  No bony abnormality is seen.  IMPRESSION: Patchy lung infiltrates remain most consistent with bilateral pneumonia.  No effusion.   Original Report Authenticated By: Juline Patch, M.D.    Dg Chest 2 View  07/28/2012   *RADIOLOGY REPORT*  Clinical Data: Follow up pneumonia.  History of Hodgkin's lymphoma.  CHEST - 2 VIEW  Comparison: CT chest yesterday.  Two-view chest x-ray yesterday, 829/2013, 07/24/2012.  PET CT 05/28/2012.  Findings: Patchy airspace opacities in both lungs, predominately the upper lobes, not significantly change since yesterday but worse than the examination 4 days ago.  No new pulmonary parenchymal abnormalities. Cardiomediastinal silhouette unremarkable, unchanged.  Right subclavian Port-A-Cath tip in the SVC. Visualized bony thorax intact.  IMPRESSION: Stable patchy airspace opacities throughout both lungs, particularly the upper lobes, when compared to yesterday, though increased since 4 days ago.   Original Report Authenticated By: Arnell Sieving, M.D.    Dg Chest 2 View  07/27/2012  *RADIOLOGY REPORT*  Clinical Data: Diabetes.  Hodgkin's lymphoma.  Pneumonia.  CHEST - 2 VIEW  Comparison: 07/26/2012  Findings: Indistinct airspace opacities noted in both lungs with slightly nodular elements, with overall burden greater on the left than the right, suspicious for bilateral pneumonia.  Power port catheter noted with tip projecting over the SVC. Cardiac and mediastinal contours appear unremarkable.  IMPRESSION:  1.  Stable indistinct airspace opacities with nodular elements. Appearance favors infection; given the nodular elements, entities such as fungal disease cannot be excluded.  Lymphomatous infiltration of the lung is considered less likely.   Original Report Authenticated By: Dellia Cloud, M.D.    Dg Chest 2 View  07/26/2012  *RADIOLOGY REPORT*  Clinical Data: Pneumonia, cough  CHEST - 2 VIEW  Comparison: 07/24/2012  Findings: Stable patchy/nodular opacities in the right mid lung and left upper lobe. No pleural effusion or pneumothorax.  Stable right chest power port.  The heart is normal in size.  IMPRESSION: Stable patchy/nodular opacities in the right midlung and left upper lobe.   Differential considerations remain pneumonia or lymphomatous involvement.  Follow-up radiographs are suggested to assess for clearing.   Original Report Authenticated By: Charline Bills, M.D.    Dg Chest 2 View  07/24/2012  *RADIOLOGY REPORT*  Clinical Data: Fever, cough.  CHEST - 2 VIEW  Comparison: 05/28/2012 PET CT  Findings: Right chest wall Port-A-Cath tip projects over the proximal SVC.  Mild peripheral reticular nodular opacities.  No pleural effusion or pneumothorax.  No acute osseous finding. Cardiomediastinal contours within normal range.  IMPRESSION: Mild peripheral reticular nodular opacities may correspond to the nodules described on recent PET CT. The  differential includes infection or metastatic disease.   Original Report Authenticated By: Waneta Martins, M.D.    Ct Chest W Contrast  07/27/2012  *RADIOLOGY REPORT*  Clinical Data: Immunocompromised patient with history of Hodgkin's lymphoma with infiltrates in the lungs and fever.  Cough.  CT CHEST WITH CONTRAST  Technique:  Multidetector CT imaging of the chest was performed following the standard protocol during bolus administration of intravenous contrast.  Contrast: 80mL OMNIPAQUE IOHEXOL 300 MG/ML  SOLN  Comparison: Chest x-ray 07/27/2012.  PET CT 05/28/2012.  Findings:  Mediastinum: Heart size is borderline enlarged. There is no significant pericardial fluid, thickening or pericardial calcification.  Again noted is some amorphous soft tissue in the anterior mediastinum, presumably lymph nodes, with the largest measuring up to 1.2 cm in short axis, similar to PET 05/28/2012. No other definite pathologically enlarged mediastinal or hilar lymph nodes are noted.  Esophagus is unremarkable in appearance. Right subclavian single lumen Port-A-Cath with tip terminating in the distal superior vena cava.  Lungs/Pleura: Compared to the prior examination the previously noted bilateral lower lobe opacities have largely resolved, with predominately  interstitial opacities remaining in their wake.  In the left lower lobe in particular there are areas of peripheral reticulation, with sparing of the immediate sub pleural lung; a nonspecific appearance that could suggest post infectious cryptogenic organizing pneumonia (COP).  In the mid and upper lungs there is new multifocal patchy nodular and mass-like ground-glass attenuation air space disease with some associated scattered septal thickening and diffuse bronchial wall thickening.  The overall appearance favors an atypical infection.  No pleural effusions.  Upper Abdomen: Focal perfusion anomaly in the liver adjacent to the falciform ligament (a common and benign finding) is incidentally noted.  Musculoskeletal: Left axillary adenopathy and soft tissue stranding is similar to recent PET examination, with the largest lymph node measuring up to 11 mm in short axis. There are no aggressive appearing lytic or blastic lesions noted in the visualized portions of the skeleton.  IMPRESSION: 1.  Markedly worsened patchy multifocal ground-glass attenuation air space disease and interstitial prominence in the mid and upper lungs bilaterally has an appearance most compatible with an atypical infection.  Differential considerations would include fungal etiologies, mycobacterial infection and other atypical organisms. 2.  The peripheral opacities in the lower lobes of the lungs noted on the prior examination have largely resolved, and there is now an appearance of the peripheral reticulation (particularly in the left lower lobe) with the sparing of the immediate sub pleural lung, which is nonspecific but may represent a manifestation of post infectious cryptogenic organizing pneumonia (COP). 3.  Additional incidental findings, as above, similar to prior examinations.   Original Report Authenticated By: Florencia Reasons, M.D.    Mr Lumbar Spine W Wo Contrast  07/25/2012  *RADIOLOGY REPORT*  Clinical Data: Low back pain,  fever, and history of Hodgkin's lymphoma.  MRI LUMBAR SPINE WITHOUT AND WITH CONTRAST  Technique:  Multiplanar and multiecho pulse sequences of the lumbar spine were obtained without and with intravenous contrast.  Contrast: 18mL MULTIHANCE GADOBENATE DIMEGLUMINE 529 MG/ML IV SOLN  Comparison: None.  Findings: Normal conus tip is at L2.  Normal paraspinal soft tissues.  The discs from T11-12 through L5-S1 are normal.  There is no facet arthritis, spinal stenosis, spondylolisthesis, or other abnormality.  There is no pathologic enhancement after contrast administration.  IMPRESSION: Normal MRI of the lumbar spine.   Original Report Authenticated By: Gwynn Burly, M.D.    US  Renal  07/25/2012  *RADIOLOGY REPORT*  Clinical Data: Urinary retention.  Back pain.  History of diabetes and Hodgkin's lymphoma.  RENAL/URINARY TRACT ULTRASOUND COMPLETE  Comparison:  None.  Findings:  Right Kidney:  11.3 cm. Normal echotexture.  Normal central sinus echo complex.  No calculi or hydronephrosis.  Left Kidney:  12.5 cm. Normal echotexture.  Normal central sinus echo complex.  No calculi or hydronephrosis.  Bladder:  Normal.  IMPRESSION: Negative renal ultrasound.   Original Report Authenticated By: Andreas Newport, M.D.     Microbiology: Recent Results (from the past 240 hour(s))  URINE CULTURE     Status: Normal   Collection Time   07/24/12 10:59 PM      Component Value Range Status Comment   Specimen Description URINE, CLEAN CATCH   Final    Special Requests Immunocompromised   Final    Culture  Setup Time 07/25/2012 05:50   Final    Colony Count >=100,000 COLONIES/ML   Final    Culture     Final    Value: Multiple bacterial morphotypes present, none predominant. Suggest appropriate recollection if clinically indicated.   Report Status 07/26/2012 FINAL   Final   CULTURE, BLOOD (ROUTINE X 2)     Status: Normal   Collection Time   07/24/12 11:10 PM      Component Value Range Status Comment   Specimen  Description BLOOD RIGHT ANTECUBITAL   Final    Special Requests BOTTLES DRAWN AEROBIC AND ANAEROBIC 4CC   Final    Culture  Setup Time 07/25/2012 04:36   Final    Culture NO GROWTH 5 DAYS   Final    Report Status 07/31/2012 FINAL   Final   CULTURE, BLOOD (ROUTINE X 2)     Status: Normal   Collection Time   07/24/12 11:20 PM      Component Value Range Status Comment   Specimen Description BLOOD NO SITE INDICATED   Final    Special Requests     Final    Value: BOTTLES DRAWN AEROBIC AND ANAEROBIC NO VOLUME INDICATED   Culture  Setup Time 07/25/2012 04:36   Final    Culture NO GROWTH 5 DAYS   Final    Report Status 07/31/2012 FINAL   Final   CULTURE, BLOOD (SINGLE)     Status: Normal   Collection Time   07/25/12  6:30 AM      Component Value Range Status Comment   Specimen Description BLOOD PORTA CATH FROM MEDIPORT   Final    Special Requests BOTTLES DRAWN AEROBIC AND ANAEROBIC 10CC EACH   Final    Culture  Setup Time 07/25/2012 11:00   Final    Culture NO GROWTH 5 DAYS   Final    Report Status 07/31/2012 FINAL   Final   CULTURE, BLOOD (ROUTINE X 2)     Status: Normal   Collection Time   07/27/12  8:00 AM      Component Value Range Status Comment   Specimen Description BLOOD RIGHT HAND   Final    Special Requests BOTTLES DRAWN AEROBIC AND ANAEROBIC [facility]   Final    Culture  Setup Time 07/27/2012 12:29   Final    Culture NO GROWTH 5 DAYS   Final    Report Status 08/02/2012 FINAL   Final   CULTURE, BLOOD (ROUTINE X 2)     Status: Normal   Collection Time   07/27/12 11:05 AM      Component Value  Range Status Comment   Specimen Description BLOOD LEFT HAND   Final    Special Requests BOTTLES DRAWN AEROBIC AND ANAEROBIC [facility]   Final    Culture  Setup Time 07/27/2012 15:02   Final    Culture NO GROWTH 5 DAYS   Final    Report Status 08/02/2012 FINAL   Final   CLOSTRIDIUM DIFFICILE BY PCR     Status: Normal   Collection Time   07/30/12  3:54 PM      Component Value Range Status Comment   C  difficile by pcr NEGATIVE  NEGATIVE Final      Labs: Basic Metabolic Panel:  Lab 08/02/12 1610 08/01/12 0640 07/31/12 0550 07/30/12 0415 07/29/12 0430  NA 134* 134* 133* 134* 136  K 3.7 3.7 3.6 3.6 3.7  CL 99 100 99 100 101  CO2 25 26 26 25 26   GLUCOSE 79 109* 90 89 77  BUN 3* <3* <3* <3* <3*  CREATININE 0.84 0.80 0.82 0.86 0.85  CALCIUM 8.8 8.7 8.8 8.7 8.7  MG 1.7 1.6 1.5 1.5 1.6  PHOS -- -- -- -- --   Liver Function Tests: No results found for this basename: AST:5,ALT:5,ALKPHOS:5,BILITOT:5,PROT:5,ALBUMIN:5 in the last 168 hours No results found for this basename: LIPASE:5,AMYLASE:5 in the last 168 hours No results found for this basename: AMMONIA:5 in the last 168 hours CBC:  Lab 08/02/12 0446 08/01/12 0640 07/31/12 0550 07/30/12 0415 07/29/12 0430  WBC 2.0* 1.8* 1.9* 2.2* 2.3*  NEUTROABS 1.0* 0.8* 0.7* 1.0* 1.1*  HGB 11.1* 10.6* 10.9* 11.0* 11.3*  HCT 31.3* 29.8* 31.2* 30.8* 31.6*  MCV 82.6 82.3 82.8 83.0 83.6  PLT 231 190 193 182 176   Cardiac Enzymes: No results found for this basename: CKTOTAL:5,CKMB:5,CKMBINDEX:5,TROPONINI:5 in the last 168 hours BNP: BNP (last 3 results) No results found for this basename: PROBNP:3 in the last 8760 hours CBG:  Lab 08/01/12 2110 08/01/12 1740 08/01/12 1213 08/01/12 0824 07/31/12 2121  GLUCAP 69* 71 68* 116* 77    Time coordinating discharge: 40 minutes  Signed:  Mavryk Pino A  [facility] 08/02/2012, 2:58 PM

## 2012-08-03 LAB — HISTOPLASMA ANTIGEN, URINE: Histoplasma Antigen, urine: <0.5 ng/mL

## 2012-08-03 LAB — GLUCOSE, CAPILLARY

## 2012-08-04 LAB — HYPERSENSITIVITY PNUEMONITIS PROFILE

## 2012-08-06 ENCOUNTER — Telehealth: Payer: Self-pay | Admitting: Oncology

## 2012-08-06 LAB — ASPERGILLUS GALACTOMANNAN ANTIGEN: Aspergillus galactomannan Index: 0.07 Index (ref <0.5)

## 2012-08-06 NOTE — Telephone Encounter (Signed)
Called pt , left message regarding lab and MD appt on 9/20

## 2012-08-08 ENCOUNTER — Telehealth: Payer: Self-pay | Admitting: *Deleted

## 2012-08-08 NOTE — Telephone Encounter (Signed)
Pt. Called Monday and left message about "stomach issues with antibiotic"   Called patient back to discuss issues.  No answer.  Left message to call us back. No call back Monday or Tuesday.  Called patient today and left message to please call us if she was still needing our help with her antibiotic.

## 2012-08-16 ENCOUNTER — Ambulatory Visit
Admission: RE | Admit: 2012-08-16 | Discharge: 2012-08-16 | Disposition: A | Discharge status: Home / Self Care | Payer: Medicaid Other | Source: Ambulatory Visit | Attending: Radiation Oncology | Admitting: Radiation Oncology

## 2012-08-16 ENCOUNTER — Encounter: Payer: Self-pay | Admitting: Radiation Oncology

## 2012-08-16 VITALS — BP 155/113 | HR 80 | Temp 98.4°F | Resp 18 | Wt 196.4 lb

## 2012-08-16 DIAGNOSIS — C8190 Hodgkin lymphoma, unspecified, unspecified site: Secondary | ICD-10-CM

## 2012-08-16 NOTE — Progress Notes (Signed)
Department of Radiation Oncology  Phone:  [phone] Fax:        [phone]   Name: [name]   DOB: 09/18/1981  MRN: [id number]    Date: 08/16/2012  Follow Up Visit Note  Diagnosis: Stage IIB Hodgkin's disease  Interval since last radiation: 1 month  Interval History: [name] presents today for routine followup.  Her swallowing has improved. She was unfortunately hospitalized for 2 weeks for an atypical pneumonia. She is recovered from that and has been out for about a week and a half. She finally got her apartment moved. Her skin continues to be discolored and dry. She is back to work in feeling well. She has a followup appointment Dr. Cyndie Chime tomorrow. She is having some soreness underneath her left axilla in the area of skin peeling. Her mouth is still dry and her taste is coming back.  Allergies:  Allergies  Allergen Reactions  . Penicillins Rash    Medications:  Current Outpatient Prescriptions  Medication Sig Dispense Refill  . ergocalciferol (VITAMIN D2) 50000 UNITS capsule Take 50,000 Units by mouth every 7 (seven) days. Sunday      . hydrocortisone cream 1 % Apply 1 application topically 2 (two) times daily as needed. For eczema      . lisinopril-hydrochlorothiazide (PRINZIDE,ZESTORETIC) 20-12.5 MG per tablet Take 1 tablet by mouth daily.      Marland Kitchen LORazepam (ATIVAN) 2 MG tablet Take 1 tablet (2 mg total) by mouth every 6 (six) hours as needed for anxiety (nausea or sleep). For anxiety  30 tablet  3  . metFORMIN (GLUCOPHAGE) 500 MG tablet Take 500 mg by mouth 2 (two) times daily with a meal.        . sulfamethoxazole-trimethoprim (BACTRIM DS) 800-160 MG per tablet Take 2 tablets by mouth 3 (three) times daily.  84 tablet  0  . zolpidem (AMBIEN) 5 MG tablet Take 5-10 mg by mouth at bedtime as needed. For sleep.      Marland Kitchen emollient (BIAFINE) cream Apply 1 application topically 2 (two) times daily as needed. For rash.      Marland Kitchen HYDROcodone-acetaminophen  (LORTAB) 7.5-500 MG/15ML solution Take 15 mLs by mouth every 6 (six) hours as needed. For pain.      Marland Kitchen ondansetron (ZOFRAN-ODT) 8 MG disintegrating tablet Take 8 mg by mouth every 8 (eight) hours as needed. For nausea.      . sucralfate (CARAFATE) 1 GM/10ML suspension Take 10 mLs (1 g total) by mouth 4 (four) times daily.  420 mL  1    Physical Exam:   weight is 196 lb 6.4 oz (89.086 kg). Her oral temperature is 98.4 F (36.9 C). Her blood pressure is 155/113 and her pulse is 80. Her respiration is 18.  She has skin discoloration and dryness over her bilateral neck and underneath her left axilla there is no desquamation or signs of infection  IMPRESSION: [name] is a 31 y.o. female who is status post radiation to her bilateral neck axilla and mediastinum completed one month ago with resolving acute effects of treatment  PLAN:  We discussed that her taste changes and dry mouth will continue to improve. We discussed use of vitamin E lotion to decrease the discoloration in her neck and under her arm. We discussed the use of SPF 30th she was going to be out in the sun for extended periods of time. We discussed yearly mammograms and breast MRI starting 10 years from now. She will continue followup with Dr. Cyndie Chime.  I am happy to see her back on an as-needed basis.    Lurline Hare, MD

## 2012-08-16 NOTE — Progress Notes (Signed)
Received patient in the clinic today for a follow up appointment with Dr. Michell Heinrich. Patient is alert and oriented to person, place, and time. No distress noted. Steady gait noted. Pleasant affect noted. Patient denies pain at this time. Patient reports she is sore under her left arm Weight stable. Discoloration of skin around neck and under left axilla noted. Patient reports using hemp cream daily on these areas. Patient reports that she was surprised skin changes happened after treatment. Patient reports taste is slowing coming back. Patient denies difficulty or pain with swallowing. Patient reports dry mouth continues. Patient reports averaging only three hours of sleep per night. Patient reports living situation has improved greatly. Reported all findings to Dr. Michell Heinrich.

## 2012-08-17 ENCOUNTER — Other Ambulatory Visit (HOSPITAL_BASED_OUTPATIENT_CLINIC_OR_DEPARTMENT_OTHER): Payer: Medicaid Other | Admitting: Lab

## 2012-08-17 ENCOUNTER — Telehealth: Payer: Self-pay | Admitting: Oncology

## 2012-08-17 ENCOUNTER — Ambulatory Visit (HOSPITAL_BASED_OUTPATIENT_CLINIC_OR_DEPARTMENT_OTHER): Payer: Medicaid Other | Admitting: Oncology

## 2012-08-17 VITALS — BP 132/82 | HR 83 | Temp 97.1°F | Resp 20 | Ht 67.0 in | Wt 195.9 lb

## 2012-08-17 DIAGNOSIS — C8118 Nodular sclerosis classical Hodgkin lymphoma, lymph nodes of multiple sites: Secondary | ICD-10-CM

## 2012-08-17 DIAGNOSIS — C8190 Hodgkin lymphoma, unspecified, unspecified site: Secondary | ICD-10-CM

## 2012-08-17 DIAGNOSIS — J849 Interstitial pulmonary disease, unspecified: Secondary | ICD-10-CM | POA: Insufficient documentation

## 2012-08-17 DIAGNOSIS — C819A Hodgkin lymphoma, unspecified, in remission: Secondary | ICD-10-CM

## 2012-08-17 DIAGNOSIS — J8409 Other alveolar and parieto-alveolar conditions: Secondary | ICD-10-CM

## 2012-08-17 LAB — CBC WITH DIFFERENTIAL/PLATELET
Basophils Absolute: 0 10*3/uL (ref 0.0–0.1)
Eosinophils Absolute: 0.2 10*3/uL (ref 0.0–0.5)
HCT: 31.4 % — ABNORMAL LOW (ref 34.8–46.6)
HGB: 10.7 g/dL — ABNORMAL LOW (ref 11.6–15.9)
MCH: 29.9 pg (ref 25.1–34.0)
MONO#: 0.4 10*3/uL (ref 0.1–0.9)
NEUT%: 58.9 % (ref 38.4–76.8)
WBC: 2.3 10*3/uL — ABNORMAL LOW (ref 3.9–10.3)
lymph#: 0.3 10*3/uL — ABNORMAL LOW (ref 0.9–3.3)

## 2012-08-17 LAB — BASIC METABOLIC PANEL (CC13)
BUN: 8 mg/dL (ref 7.0–26.0)
Chloride: 106 mEq/L (ref 98–107)
Glucose: 119 mg/dl — ABNORMAL HIGH (ref 70–99)
Potassium: 3.7 mEq/L (ref 3.5–5.1)

## 2012-08-17 NOTE — Telephone Encounter (Signed)
gve the pt her pet scan appt. Pt is aware i will call her with the lab appt

## 2012-08-17 NOTE — Progress Notes (Signed)
[name] [id number] October 12, 1981 30 y.o. 08/17/2012 11:55 AM  CC: Dr. Milford Cage; Dr. Lurline Hare; Dr. Paulette Blanch Dam; Dr Marcelyn Bruins; Dr Gaynelle Adu   Oncology Treatment Dates:  ABVD chemotherapy x4 cycles: 02/03/12-05/12/12 Involved field radiation therapy: 36 gray 1.8 gray per fraction 25 fractions bilateral neck, left axilla, and mediastinum 06/18/12-07/16/12  Diagnosis: IIB Hodgkin's lymphoma   Complications: Bilateral interstitial pneumonia 07/25/12    History of Present Illness:  Pleasant 31 year old woman with early onset essential hypertension and type 2 diabetes who presented with a one-year history of an enlarging left axillary lymph node mass then fevers and an approximate 20 pound weight loss. She was initially treated for infection. She did not have a diagnostic biopsy until 01/12/2012. Findings were consistent with nodular sclerosing Hodgkin's lymphoma. Tissue is positive for CD20, 15, and 30. On initial physical exam, there was bulky bilateral cervical, supraclavicular, and axillary lymphadenopathy with massive adenopathy in the left axilla. No inguinal adenopathy. No organomegaly. CT scans did not show any disease below the diaphragm. A PET scan done on 01/24/2012 showed bilateral hypermetabolic level II and level IV lymph nodes as well as level III and 4 nodes. Maximum SUV 11.9. Bilateral bulky supraclavicular nodes. Bulky left axillary and subpectoral nodes. SUV 8.1. Low level activity within small paratracheal nodes and a small node in the left axilla. No abnormal activity in the abdomen pelvis or spleen. No abnormal activity in the bone or bone marrow.  She began a course of chemotherapy with standard ABVD regimen on March 8. She tolerated treatments well. She received full doses throughout with no dose reductions. She had a nice clinical response. Radiographically, a restaging PET scan prior to initiating involved field radiation done on 05/28/2012 showed  resolution of the previous hypermetabolic activity in cervical and supraclavicular lymph nodes, mild residual activity 2.8 in the left axilla, and mild activity over the soft tissues of the mediastinum 4.0. Also noted were scattered subcentimeter bilateral pulmonary nodules which were not hypermetabolic. Additional patchy bilateral lower lung lobe opacities. X-rays were good in the thoracic medicine conference. At that point she was asymptomatic and had just completed treatment. I elected to follow her clinically. I ordered a CT scan and the physician/oncologist/from [employer] would not authorize the scan to be done.  She began to develop a hacking, nonproductive cough, initially without any fever. She was treated with oral antibiotics but the cough worsened. By the time of completion of radiation therapy on August 19, she was starting to have intermittent high fever along with progressive cough. Her lung exam revealed no abnormalities. A routine chest radiograph was normal. She was admitted to the hospital on August 27 for further evaluation. A CT scan of the chest was done at that point and showed diffuse, bilateral, interstitial pulmonary infiltrates. She was started on empiric treatment with a combination of Levaquin and parenteral Septra. She had initial hectic fevers up to 103. She developed progressive leukopenia with white count down to 1800 with 20% monocytes. There was a high concern that she had a opportunistic infection. She was seen in consultation by pulmonary medicine and infectious disease. A bronchoscopy was planned. However, she responded promptly to addition of parenteral Septra to her regimen with resolution of fevers and improvement in her cough. Bronchoscopy was not done. A urine for Legionella antigen was negative. Serum Aspergillus antigen was not detected. Histoplasmosis antigen not detected.  She's had no problems since hospital discharge. Her cough is resolved. She has no dyspnea.  No fevers. She does have persistent leukopenia but it is slowly improving and total white count today of 2 2300 with 59% neutrophils, 14 lymphocytes, 16 monocytes.  Medications: Zestoretic 20-12.5 mg 1 daily; lorazepam 2 mg every 6 hours when necessary anxiety nausea or sleep; Glucophage 500 mg twice a day with meals Ambien 5 mg one to 2 at bedtime when necessary sleep Lortab 7.5-515 mL by mouth every 6 hours when necessary pain vitamin D 5000 units daily   Allergies:   Allergies  Allergen Reactions  . Penicillins Rash    Physical Exam: Blood pressure 132/82, pulse 83, temperature 97.1 F (36.2 C), temperature source Oral, resp. rate 20, height 5\' 7"  (1.702 m), weight 195 lb 14.4 oz (88.86 kg). Pharynx no erythema or exudate Lymph nodes: Resolved cervical supraclavicular and axillary lymph node enlargement Lungs: Clear to auscultation resonant to percussion Heart: Regular rhythm no murmur gallop or rub Abdomen: Soft, nontender, no mass, no organomegaly Extremities: No calf tenderness or swelling Vascular: No cyanosis Musculoskeletal: Normal Neurologic: Mental status intact, cranial nerves intact, motor strength 5 over 5, reflexes 1+ symmetric  Lab Results: Lab Results  Component Value Date   WBC 2.3* 08/17/2012   HGB 10.7* 08/17/2012   HCT 31.4* 08/17/2012   MCV 87.6 08/17/2012   PLT 175 08/17/2012     Chemistry      Component Value Date/Time   NA 143 08/17/2012 1045   NA 134* 08/02/2012 0446   NA 139 05/28/2012 0818   K 3.7 08/17/2012 1045   K 3.7 08/02/2012 0446   K 4.6 05/28/2012 0818   CL 106 08/17/2012 1045   CL 99 08/02/2012 0446   CL 95* 05/28/2012 0818   CO2 26 08/17/2012 1045   CO2 25 08/02/2012 0446   CO2 28 05/28/2012 0818   BUN 8.0 08/17/2012 1045   BUN 3* 08/02/2012 0446   BUN 11 05/28/2012 0818   CREATININE 0.8 08/17/2012 1045   CREATININE 0.84 08/02/2012 0446   CREATININE 0.8 05/28/2012 0818   CREATININE 0.67 10/08/2007 1250      Component Value Date/Time   CALCIUM 9.2  08/17/2012 1045   CALCIUM 8.8 08/02/2012 0446   CALCIUM 9.3 05/28/2012 0818   ALKPHOS 74 07/24/2012 2310   ALKPHOS 71 05/28/2012 0818   AST 17 07/24/2012 2310   AST 24 05/28/2012 0818   ALT 21 07/24/2012 2310   BILITOT 0.4 07/24/2012 2310   BILITOT 0.60 05/28/2012 0818     Impression: #1. Stage IIB nodular sclerosing Hodgkin's lymphoma She had some residual activity on the postchemotherapy PET scan. She needs another followup study at this time to confirm complete response. If confirmed, I will see her again in 4 months. Guidelines for followup scans are rapidly changing and I will refer to the national cancer comprehensive network guidelines for a schedule for subsequent studies.  #2. Bilateral interstitial pneumonia. Most likely pneumocystis pneumonia. She received a week of parenteral Septra in the hospital and has completed 2 weeks of outpatient oral Septra. Her insurance company would not let me get a CT before. She certainly needs one now to followup on the pneumonia and the lymphoma. I was told to get a PET/CT even though when I discussed this with the oncology reviewer I informed him that the CT images on a current PET/CT scan include only selected spot images and don't compare in accuracy with a diagnostic CT scan. When I then ordered a followup CT scan, this was also not authorized. If I run  into any problems ordering the current scan, I will need to take it to a higher authority.  #3. Leukopenia. Likely residual from chemotherapy, radiation, and infection. I expect that this will improve over time. There has already been improvement since hospital discharge.  #4 and. Type 2 diabetes  #5. Hypertension  Follow Up Plan:  It has been my general routine to obtain CT scans every 4 months for the first 2 years after completion of therapy for Hodgkin's lymphoma and if stable at that point then only do annual physical exams and chest radiographs. We did give her Zoladex to preserve ovarian function  while she was getting chemotherapy. In general most young women will regain ovarian function.   Levert Feinstein, MD 9/20/201311:55 AM

## 2012-08-20 ENCOUNTER — Encounter: Payer: Self-pay | Admitting: Oncology

## 2012-08-20 NOTE — Progress Notes (Signed)
08/20/2012   Good Afternoon Dr. Cyndie Chime,  I am submitting this request to [employer] in just a few minutes and will keep you informed of the progress of it.  [name] [phone]

## 2012-08-29 ENCOUNTER — Ambulatory Visit (HOSPITAL_COMMUNITY): Admission: RE | Admit: 2012-08-29 | Payer: Medicaid Other | Source: Ambulatory Visit

## 2012-08-31 ENCOUNTER — Encounter: Payer: Self-pay | Admitting: Oncology

## 2012-08-31 NOTE — Progress Notes (Signed)
Called pt to advised per DSS her Medicaid ending Sept 30, 2013. DSS mailed a letter to Ms. Pacer requesting more information before they reinstate her Medicaid. No answer, left message to call me back. Casework Ms. Hollice Espy [phone].

## 2012-08-31 NOTE — Progress Notes (Signed)
08/31/2012  This is a follow-up note concerning the PET SCAN for this patient.  My manager spoke with Ms. Galla's Child psychotherapist.  The patient failed to provide the necessary requested information and this resulted in her [place] Medicaid becoming inactive.  Brett Canales from [facility] spoke with Ms. Esquer to inform her that she would have to assume full financial responsibility for this procedure.  Patient refused to sign the agreement.  It will take approximately 30 days for this policy to be reviewed and reinstated.  Brandt Loosen let a detailed message on the patient's voice mail and asked for a return call.  The PET SCAN has been cancelled due to the expense of both the Contrast and the Exam.  The Contrast had to be ordered today @ 3:00 pm.  Thank you,  [name]  [phone]

## 2012-09-03 ENCOUNTER — Ambulatory Visit (HOSPITAL_COMMUNITY): Payer: Medicaid Other

## 2012-10-14 ENCOUNTER — Emergency Department (HOSPITAL_COMMUNITY)
Admission: EM | Admit: 2012-10-14 | Discharge: 2012-10-15 | Disposition: A | Discharge status: Home / Self Care | Payer: Medicaid Other | Attending: Emergency Medicine | Admitting: Emergency Medicine

## 2012-10-14 ENCOUNTER — Emergency Department (HOSPITAL_COMMUNITY): Payer: Medicaid Other

## 2012-10-14 ENCOUNTER — Encounter (HOSPITAL_COMMUNITY): Payer: Self-pay | Admitting: Emergency Medicine

## 2012-10-14 DIAGNOSIS — I10 Essential (primary) hypertension: Secondary | ICD-10-CM | POA: Insufficient documentation

## 2012-10-14 DIAGNOSIS — M545 Low back pain, unspecified: Secondary | ICD-10-CM | POA: Insufficient documentation

## 2012-10-14 DIAGNOSIS — Z8571 Personal history of Hodgkin lymphoma: Secondary | ICD-10-CM | POA: Insufficient documentation

## 2012-10-14 DIAGNOSIS — Z87891 Personal history of nicotine dependence: Secondary | ICD-10-CM | POA: Insufficient documentation

## 2012-10-14 DIAGNOSIS — Z79899 Other long term (current) drug therapy: Secondary | ICD-10-CM | POA: Insufficient documentation

## 2012-10-14 DIAGNOSIS — Z3202 Encounter for pregnancy test, result negative: Secondary | ICD-10-CM | POA: Insufficient documentation

## 2012-10-14 DIAGNOSIS — R300 Dysuria: Secondary | ICD-10-CM | POA: Insufficient documentation

## 2012-10-14 DIAGNOSIS — J45909 Unspecified asthma, uncomplicated: Secondary | ICD-10-CM | POA: Insufficient documentation

## 2012-10-14 DIAGNOSIS — M549 Dorsalgia, unspecified: Secondary | ICD-10-CM | POA: Insufficient documentation

## 2012-10-14 DIAGNOSIS — E119 Type 2 diabetes mellitus without complications: Secondary | ICD-10-CM | POA: Insufficient documentation

## 2012-10-14 NOTE — ED Provider Notes (Signed)
History     CSN: [id number]  Arrival date & time 10/14/12  2147   First MD Initiated Contact with Patient 10/14/12 2236      Chief Complaint  Patient presents with  . Back Pain    (Consider location/radiation/quality/duration/timing/severity/associated sxs/prior treatment) HPI Comments: Low back pain radiating to bilateral hips and upper thighs on/off for the past week Denies injury, fever, diarrhea constipation but report dysuria Hx Hodgkin's Lymphoma finished radiation and chemo recently waiting for confirmatory PET Scan in 1-2 weeks   Patient is a 31 y.o. female presenting with back pain. The history is provided by the patient.  Back Pain  This is a new problem. The current episode started more than 2 days ago. The problem has been gradually worsening. The pain is associated with no known injury. The pain is present in the lumbar spine. The quality of the pain is described as aching. The pain radiates to the left thigh and right thigh. The pain is at a severity of 4/10. The pain is moderate. The symptoms are aggravated by certain positions. Associated symptoms include dysuria. Pertinent negatives include no fever, no numbness, no pelvic pain, no tingling and no weakness.    Past Medical History  Diagnosis Date  . Hypertension   . Diabetes mellitus   . Complication of anesthesia SLOW TO WAKE UP AFTER C SECTION  . Hodgkin lymphoma 01/18/2012  . Benign essential HTN 01/19/2012  . DM II (diabetes mellitus, type II), controlled 01/19/2012  . Eczema 01/19/2012  . Muscle spasm of back 03/09/2012  . hodgkins lymphoma dx'd 01/16/12    lt axilla and left neck   . S/P chemotherapy, time since 4-12 weeks 05/11/2012    4 cycles of ABVD with neulasta suppor and zoladex  . Asthma     seasonal  . Cough, persistent 07/27/2012  . Leukopenia 08/01/2012    Past Surgical History  Procedure Date  . Dilation and curettage of uterus   . Cesarean section   . Axillary lymph node biopsy 12/2011    left    . Portacath placement 01/25/2012    Procedure: INSERTION PORT-A-CATH;  Surgeon: Atilano Ina, MD,FACS;  Location: [facility];  Service: General;  Laterality: Right;  right subclavian    Family History  Problem Relation Age of Onset  . Diabetes Maternal Grandmother   . Heart disease Maternal Grandfather   . Cancer Neg Hx     History  Substance Use Topics  . Smoking status: Former Smoker    Quit date: 11/19/2006  . Smokeless tobacco: Never Used  . Alcohol Use: No    OB History    Grav Para Term Preterm Abortions TAB SAB Ect Mult Living   3 1 1  2  0 2 0 0 1      Review of Systems  Constitutional: Negative for fever.  HENT: Negative for neck pain.   Respiratory: Negative for shortness of breath.   Gastrointestinal: Negative for nausea and vomiting.  Genitourinary: Positive for dysuria. Negative for flank pain and pelvic pain.  Musculoskeletal: Positive for back pain.  Skin: Negative for rash and wound.  Neurological: Negative for tingling, weakness and numbness.    Allergies  Penicillins  Home Medications   Current Outpatient Rx  Name  Route  Sig  Dispense  Refill  . ERGOCALCIFEROL 50000 UNITS PO CAPS   Oral   Take 50,000 Units by mouth every 7 (seven) days. Sunday         . HYDROCORTISONE 1 %  EX CREA   Topical   Apply 1 application topically 2 (two) times daily as needed. For eczema         . LISINOPRIL-HYDROCHLOROTHIAZIDE 20-12.5 MG PO TABS   Oral   Take 1 tablet by mouth daily.         Marland Kitchen LORAZEPAM 2 MG PO TABS   Oral   Take 1 tablet (2 mg total) by mouth every 6 (six) hours as needed for anxiety (nausea or sleep). For anxiety   30 tablet   3     This script was given to pt on 01/18/12.   . METFORMIN HCL 500 MG PO TABS   Oral   Take 500 mg by mouth 2 (two) times daily with a meal.           . NAPROXEN SODIUM 220 MG PO TABS   Oral   Take 440 mg by mouth 2 (two) times daily with a meal. For back pain         . ZOLPIDEM TARTRATE 5 MG PO TABS    Oral   Take 5-10 mg by mouth at bedtime as needed. For sleep.         Marland Kitchen DIAZEPAM 5 MG PO TABS   Oral   Take 1 tablet (5 mg total) by mouth every 8 (eight) hours as needed for anxiety.   20 tablet   0   . HYDROCODONE-ACETAMINOPHEN 5-325 MG PO TABS   Oral   Take 1 tablet by mouth every 6 (six) hours as needed for pain.   10 tablet   0     BP 136/92  Pulse 64  Temp 98.1 F (36.7 C) (Oral)  Resp 18  SpO2 97%  Physical Exam  Constitutional: She appears well-developed and well-nourished.  HENT:  Head: Normocephalic.  Eyes: Pupils are equal, round, and reactive to light.  Neck: Normal range of motion.  Cardiovascular: Normal rate.   Pulmonary/Chest: Effort normal.  Abdominal: Soft.  Musculoskeletal: Normal range of motion. She exhibits tenderness. She exhibits no edema.       Arms: Neurological: She is alert.  Skin: Skin is warm. No rash noted. No pallor.    ED Course  Procedures (including critical care time)   Labs Reviewed  URINALYSIS, ROUTINE W REFLEX MICROSCOPIC  PREGNANCY, URINE   Dg Lumbar Spine Complete  10/14/2012  *RADIOLOGY REPORT*  Clinical Data: Low back pain for 1.5 weeks, history lymphoma  LUMBAR SPINE - COMPLETE 4+ VIEW  Comparison: None Correlation:  MRI lumbar spine 07/25/2012  Findings: Hypoplastic last rib pair with four additional non-rib bearing lumbar type vertebrae. Vertebral body and disc space heights maintained. No acute fracture, subluxation or bone destruction. No spondylolysis. SI joints symmetric. IUD in pelvis.  IMPRESSION: No acute abnormalities.   Original Report Authenticated By: Ulyses Southward, M.D.      1. Low back pain       MDM    Due to patient's medical history x-ray was performed as well as a urine, chloride, think this is all musculoskeletal in nature. X-ray is negative.  Urine is not infected      Arman Filter, NP 10/15/12 0413  Arman Filter, NP 10/15/12 [phone]

## 2012-10-14 NOTE — ED Notes (Addendum)
Pt reports that she has 8/10 lower back pain. Pt is receiving treatment at the [facility] for hodgkin's lymphoma. Pt reports taking aleve, which has not relieved her pain. Pt is concerned about her IUD, which she has had for 5-years, and is concerned about its age and if it should be removed.

## 2012-10-15 LAB — URINALYSIS, ROUTINE W REFLEX MICROSCOPIC
Glucose, UA: NEGATIVE mg/dL
Hgb urine dipstick: NEGATIVE
Ketones, ur: NEGATIVE mg/dL
Protein, ur: NEGATIVE mg/dL

## 2012-10-15 LAB — PREGNANCY, URINE: Preg Test, Ur: NEGATIVE

## 2012-10-15 MED ORDER — KETOROLAC TROMETHAMINE 30 MG/ML IJ SOLN
30.0000 mg | Freq: Once | INTRAMUSCULAR | Status: AC
  Administered 2012-10-15: 30 mg via INTRAMUSCULAR
  Filled 2012-10-15: qty 1

## 2012-10-15 MED ORDER — HYDROCODONE-ACETAMINOPHEN 5-325 MG PO TABS: 1.0000 | ORAL_TABLET | Freq: Four times a day (QID) | ORAL | Status: DC | PRN

## 2012-10-15 MED ORDER — DIAZEPAM 5 MG PO TABS: 5.0000 mg | ORAL_TABLET | Freq: Three times a day (TID) | ORAL | Status: DC | PRN

## 2012-10-15 MED ORDER — DIAZEPAM 5 MG PO TABS
5.0000 mg | ORAL_TABLET | Freq: Once | ORAL | Status: AC
  Administered 2012-10-15: 5 mg via ORAL
  Filled 2012-10-15: qty 1

## 2012-10-15 NOTE — ED Provider Notes (Signed)
Medical screening examination/treatment/procedure(s) were performed by non-physician practitioner and as supervising physician I was immediately available for consultation/collaboration.   Clementine Soulliere H Yanette Tripoli, MD 10/15/12 0431 

## 2012-12-18 ENCOUNTER — Ambulatory Visit: Payer: Self-pay | Admitting: Family Medicine

## 2013-01-10 ENCOUNTER — Ambulatory Visit: Payer: Self-pay | Admitting: Family Medicine

## 2013-01-24 ENCOUNTER — Encounter: Payer: Self-pay | Admitting: Family Medicine

## 2013-01-24 ENCOUNTER — Ambulatory Visit (INDEPENDENT_AMBULATORY_CARE_PROVIDER_SITE_OTHER): Payer: Medicaid Other | Admitting: Family Medicine

## 2013-01-24 VITALS — BP 155/90 | HR 88 | Temp 98.6°F | Ht 67.0 in | Wt 213.0 lb

## 2013-01-24 DIAGNOSIS — Z309 Encounter for contraceptive management, unspecified: Secondary | ICD-10-CM

## 2013-01-24 DIAGNOSIS — IMO0001 Reserved for inherently not codable concepts without codable children: Secondary | ICD-10-CM | POA: Insufficient documentation

## 2013-01-24 DIAGNOSIS — I10 Essential (primary) hypertension: Secondary | ICD-10-CM

## 2013-01-24 DIAGNOSIS — M5380 Other specified dorsopathies, site unspecified: Secondary | ICD-10-CM

## 2013-01-24 DIAGNOSIS — M62830 Muscle spasm of back: Secondary | ICD-10-CM

## 2013-01-24 MED ORDER — TRAMADOL HCL 50 MG PO TABS: 50.0000 mg | ORAL_TABLET | Freq: Three times a day (TID) | ORAL | Status: DC | PRN

## 2013-01-24 MED ORDER — LISINOPRIL-HYDROCHLOROTHIAZIDE 20-12.5 MG PO TABS: 2.0000 | ORAL_TABLET | Freq: Every day | ORAL | Status: DC

## 2013-01-24 NOTE — Patient Instructions (Signed)
Come back in 2-3 weeks for a blood pressure check and lab check. Make sure you haven't eaten anything at that time.  Change your Medicaid card to [place] so that we can refer you to a cardiologist.    Take the Tramadol for pain relief.  Make an appoint in the next 4-6 weeks for the IUD change.    It was good to meet you today

## 2013-01-24 NOTE — Assessment & Plan Note (Signed)
Tramadol for relief.  No red flags.

## 2013-01-24 NOTE — Assessment & Plan Note (Signed)
Doubled dose of ACE-diuretic. FU for BP recheck in 2-3 weeks.  Lab visit at that time as well.   Placing orders today.

## 2013-01-24 NOTE — Assessment & Plan Note (Signed)
Will discuss at next visit.  She desires IUD.

## 2013-01-24 NOTE — Progress Notes (Signed)
[name] is a 32 y.o. female who presents to [facility] today for new patient visit:  Hx/o Hodkins' Lymphoma:  Diagnosed in Feb 2013.  Found after continued growth of mass under her Left arm, biopsy revealed diagnosis.  She is being followed at [facility], treated with radiation and chemotherapy.  Last chemo was July, radiation completed in Sept/Oct.  Has been doing well overall since then.  She is working full-time again.  Still with some fatigue, admits she shouldn't be working as much.    With chemo, she has suffered minor loss of systolic function based on Echo, likely secondary to chemo treatment.  Onc recommends cardiology follow-up.   Needs final scan to ensure she's clear but she had insurance issues, and therefore needs to follow up with Dr. Cyndie Chime.    Hypertension:  Long-term problem for this patient since age 32.  During radiation (see above), she lost her sense of taste and therefore stopped eating so much.  Lost significant weight during this and blood pressure normalized.  No adverse effects from medication.  Not checking it regularly.  No HA, CP, dizziness, shortness of breath, palpitations, or LE swelling.   BP Readings from Last 3 Encounters:  01/24/13 160/120  10/15/12 133/76  08/17/12 132/82   Contraception:  Desires replacement of IUD.  Last was placed about 5 years ago.    Back pain:  Long-term problem for patient.  Takes vicodin chronically for this.  Imaging has been negative previously.  No inciting incidents/triggers/trauma.  Worse after prolonged standing.  No fevers/chills/bladder or bowel incontinence.    The following portions of the patient's history were reviewed and updated as appropriate: allergies, current medications, past medical history, family and social history, and problem list.  Patient is a nonsmoker.    Past Medical History  Diagnosis Date  . Hypertension   . Diabetes mellitus   . Complication of anesthesia SLOW TO WAKE UP AFTER C SECTION   . Hodgkin lymphoma 01/18/2012  . Benign essential HTN 01/19/2012  . DM II (diabetes mellitus, type II), controlled 01/19/2012  . Eczema 01/19/2012  . Muscle spasm of back 03/09/2012  . hodgkins lymphoma dx'd 01/16/12    lt axilla and left neck   . S/P chemotherapy, time since 4-12 weeks 05/11/2012    4 cycles of ABVD with neulasta suppor and zoladex  . Asthma     seasonal  . Cough, persistent 07/27/2012  . Leukopenia 08/01/2012   Past Surgical History  Procedure Laterality Date  . Dilation and curettage of uterus    . Cesarean section    . Axillary lymph node biopsy  12/2011    left  . Portacath placement  01/25/2012    Procedure: INSERTION PORT-A-CATH;  Surgeon: Atilano Ina, MD,FACS;  Location: [facility];  Service: General;  Laterality: Right;  right subclavian    Medications reviewed. Current Outpatient Prescriptions  Medication Sig Dispense Refill  . diazepam (VALIUM) 5 MG tablet Take 1 tablet (5 mg total) by mouth every 8 (eight) hours as needed for anxiety.  20 tablet  0  . ergocalciferol (VITAMIN D2) 50000 UNITS capsule Take 50,000 Units by mouth every 7 (seven) days. Sunday      . HYDROcodone-acetaminophen (NORCO/VICODIN) 5-325 MG per tablet Take 1 tablet by mouth every 6 (six) hours as needed for pain.  10 tablet  0  . hydrocortisone cream 1 % Apply 1 application topically 2 (two) times daily as needed. For eczema      .  lisinopril-hydrochlorothiazide (PRINZIDE,ZESTORETIC) 20-12.5 MG per tablet Take 1 tablet by mouth daily.      Marland Kitchen LORazepam (ATIVAN) 2 MG tablet Take 1 tablet (2 mg total) by mouth every 6 (six) hours as needed for anxiety (nausea or sleep). For anxiety  30 tablet  3  . metFORMIN (GLUCOPHAGE) 500 MG tablet Take 500 mg by mouth 2 (two) times daily with a meal.        . naproxen sodium (ANAPROX) 220 MG tablet Take 440 mg by mouth 2 (two) times daily with a meal. For back pain      . zolpidem (AMBIEN) 5 MG tablet Take 5-10 mg by mouth at bedtime as needed. For sleep.        No current facility-administered medications for this visit.    ROS as above otherwise neg.  No chest pain, palpitations, SOB, Fever, Chills, Abd pain, N/V/D.  Physical Exam:  BP 160/120  Pulse 88  Temp(Src) 98.6 F (37 C) (Oral)  Ht 5\' 7"  (1.702 m)  Wt 213 lb (96.616 kg)  BMI 33.35 kg/m2 Gen:  Alert, cooperative patient who appears stated age in no acute distress.  Vital signs reviewed. HEENT: EOMI,  MMM Pulm:  Clear to auscultation bilaterally with good air movement.  No wheezes or rales noted.   Cardiac:  Regular rate and rhythm without murmur auscultated.  Good S1/S2. Abd:  Soft/nondistended/nontender.  Good bowel sounds throughout all four quadrants.  No masses noted.  Back:  Normal skin, Spine with normal alignment and no deformity.  No tenderness to vertebral process palpation.  Paraspinous muscles are mildly tender and without spasm.   Range of motion is full at neck and lumbar sacral regions.  Straight leg raise is negative.  Neuro:  Sensation and motor function 5/5 bilateral lower extremities.  Patellar and Achilles  DTR's +2 patellar BL  Exts: Non edematous BL  LE, warm and well perfused.  Psych:  Alert and oriented, pleasant, not depressed or anxious appearing  No results found for this or any previous visit (from the past 72 hour(s)).

## 2013-02-07 ENCOUNTER — Other Ambulatory Visit: Payer: Self-pay | Admitting: Family Medicine

## 2013-02-07 ENCOUNTER — Other Ambulatory Visit: Payer: Medicaid Other

## 2013-02-07 ENCOUNTER — Ambulatory Visit: Payer: Medicaid Other | Admitting: *Deleted

## 2013-02-07 VITALS — BP 150/100 | HR 68

## 2013-02-07 DIAGNOSIS — I10 Essential (primary) hypertension: Secondary | ICD-10-CM

## 2013-02-07 LAB — COMPREHENSIVE METABOLIC PANEL
ALT: 15 U/L (ref 0–35)
Albumin: 3.9 g/dL (ref 3.5–5.2)
BUN: 13 mg/dL (ref 6–23)
CO2: 26 mEq/L (ref 19–32)
Calcium: 9.4 mg/dL (ref 8.4–10.5)
Chloride: 104 mEq/L (ref 96–112)
Creat: 0.8 mg/dL (ref 0.50–1.10)

## 2013-02-07 LAB — CBC
HCT: 37.4 % (ref 36.0–46.0)
RDW: 14.8 % (ref 11.5–15.5)
WBC: 3.2 10*3/uL — ABNORMAL LOW (ref 4.0–10.5)

## 2013-02-07 MED ORDER — AMLODIPINE BESYLATE 2.5 MG PO TABS: 2.5000 mg | ORAL_TABLET | Freq: Every day | ORAL | Status: DC

## 2013-02-07 NOTE — Progress Notes (Signed)
CMP AND CBC DONE TODAY MARCI HOLDER 

## 2013-02-08 ENCOUNTER — Encounter: Payer: Self-pay | Admitting: Family Medicine

## 2013-02-08 NOTE — Progress Notes (Signed)
Patient was in office for BP check . BP first checked manually using regular adult cuff . BP LA 160/110 and RA 160/106.  After resting for 10 minutes  checked with Dynamap with reading of RA 143/95 and LA 143/98. Rechecked with manual cuff at 150/100 pulse 68. Dr. Gwendolyn Grant notified and he added amlodipine. Plan to follow up with MD in 2 weeks.

## 2013-02-14 ENCOUNTER — Telehealth: Payer: Self-pay | Admitting: *Deleted

## 2013-02-14 NOTE — Telephone Encounter (Signed)
Patient is in the process of changing her medicaid from  "family"  To a "adult".  Needs paperwork saying she can go back to work.  She has not seen Dr. Cyndie Chime since 9/13.  She just saw her PCP, Dr. Gwendolyn Grant on 01/24/13.   Suggested that she get the paperwork from Dr. Gwendolyn Grant since she just saw him.  She will do this.  She will call our Managed Care Department when her medicaid changes are finalized and she what she needs to do to get her "final scan."

## 2013-02-21 ENCOUNTER — Telehealth: Payer: Self-pay | Admitting: Family Medicine

## 2013-02-21 ENCOUNTER — Ambulatory Visit: Payer: Medicaid Other | Admitting: Family Medicine

## 2013-02-21 NOTE — Telephone Encounter (Signed)
Patient dropped off from to be filled out for social services.  Please mail in the attached envelope when completed.

## 2013-02-22 ENCOUNTER — Encounter: Payer: Self-pay | Admitting: Family Medicine

## 2013-02-22 NOTE — Telephone Encounter (Signed)
LVM for patient to call back. ?

## 2013-02-22 NOTE — Telephone Encounter (Signed)
Left message for a return call.[name]  

## 2013-02-22 NOTE — Telephone Encounter (Signed)
Patient needs an appt for this.  I have only seen her once and that was back in February.  Unclear why she has been out of work for this time.  Do not know what to put in the "Medical diagnosis" section.

## 2013-02-22 NOTE — Telephone Encounter (Signed)
Dr Gwendolyn Grant patient has decided to have her oncologist fill the form. If you could please let me know were form iare I would faxed them to the oncologist once I receive a phone# and a fax number from the patient.Thank You. Kenli Waldo, Virgel Bouquet

## 2013-02-25 NOTE — Telephone Encounter (Signed)
Left message for pt to rtn my call. Kaytelyn Glore, Virgel Bouquet

## 2013-02-25 NOTE — Telephone Encounter (Signed)
It's still in my box.

## 2013-02-26 ENCOUNTER — Encounter: Payer: Self-pay | Admitting: Oncology

## 2013-02-26 NOTE — Progress Notes (Signed)
Put [facility] form on [employer].

## 2013-02-28 ENCOUNTER — Encounter: Payer: Self-pay | Admitting: Family Medicine

## 2013-02-28 ENCOUNTER — Ambulatory Visit (INDEPENDENT_AMBULATORY_CARE_PROVIDER_SITE_OTHER): Payer: Medicaid Other | Admitting: Family Medicine

## 2013-02-28 VITALS — BP 154/108 | HR 83 | Temp 99.8°F | Ht 67.0 in | Wt 212.0 lb

## 2013-02-28 DIAGNOSIS — M5380 Other specified dorsopathies, site unspecified: Secondary | ICD-10-CM

## 2013-02-28 DIAGNOSIS — M62830 Muscle spasm of back: Secondary | ICD-10-CM

## 2013-02-28 DIAGNOSIS — I10 Essential (primary) hypertension: Secondary | ICD-10-CM

## 2013-02-28 MED ORDER — LISINOPRIL-HYDROCHLOROTHIAZIDE 20-25 MG PO TABS: 1.0000 | ORAL_TABLET | Freq: Every day | ORAL | Status: DC

## 2013-02-28 MED ORDER — CYCLOBENZAPRINE HCL 10 MG PO TABS: 10.0000 mg | ORAL_TABLET | Freq: Three times a day (TID) | ORAL | Status: DC | PRN

## 2013-02-28 NOTE — Progress Notes (Signed)
Subjective:    [name] is a 32 y.o. female who presents to [facility] today for FU for HTN:  1.  HTN:   Long-term problem for this patient.  No adverse effects from medication.  Reports taking this on daily basis, no missed doses.  Checks at pharmacies when she is out, runs systolic 140 - 160 and diastolic 80 - 100's.  Reports snoring which is chronic for her.  No HA, CP, dizziness, shortness of breath, palpitations, or LE swelling.   BP Readings from Last 3 Encounters:  02/28/13 154/108  02/07/13 150/100  01/24/13 155/90    2.  Back pain:  Persists.  Has relief with Tramadol, but still with pain at night.  Non-radiating.  Occasionally keeps her awake at night.  Bilateral lumbar region.  No bladder/bowel incontinence.  Doing exercises infrequently. No fevers, chills, weight loss, trauma.     The following portions of the patient's history were reviewed and updated as appropriate: allergies, current medications, past medical history, family and social history, and problem list. Patient is a nonsmoker.    PMH reviewed.  Past Medical History  Diagnosis Date  . Hypertension   . Complication of anesthesia SLOW TO WAKE UP AFTER C SECTION  . Benign essential HTN 01/19/2012  . Eczema 01/19/2012  . Muscle spasm of back 03/09/2012  . S/P chemotherapy, time since 4-12 weeks 05/11/2012    4 cycles of ABVD with neulasta suppor and zoladex  . Cough, persistent 07/27/2012  . Leukopenia 08/01/2012  . Asthma     seasonal, worse in spring.  Uses inhaler during this time of year  . Migraine   . Hodgkin lymphoma 01/18/2012  . hodgkins lymphoma dx'd 01/16/12    lt axilla and left neck   . Diabetes mellitus   . DM II (diabetes mellitus, type II), controlled 01/19/2012   Past Surgical History  Procedure Laterality Date  . Dilation and curettage of uterus    . Axillary lymph node biopsy  12/2011    left  . Portacath placement  01/25/2012    Procedure: INSERTION PORT-A-CATH;  Surgeon: Atilano Ina,  MD,FACS;  Location: [facility];  Service: General;  Laterality: Right;  right subclavian  . Cesarean section  2008    Medications reviewed. Current Outpatient Prescriptions  Medication Sig Dispense Refill  . amLODipine (NORVASC) 2.5 MG tablet Take 1 tablet (2.5 mg total) by mouth daily.  30 tablet  1  . LORazepam (ATIVAN) 2 MG tablet Take 1 tablet (2 mg total) by mouth every 6 (six) hours as needed for anxiety (nausea or sleep). For anxiety  30 tablet  3  . metFORMIN (GLUCOPHAGE) 500 MG tablet Take 500 mg by mouth 2 (two) times daily with a meal.        . naproxen sodium (ANAPROX) 220 MG tablet Take 440 mg by mouth 2 (two) times daily with a meal. For back pain      . traMADol (ULTRAM) 50 MG tablet Take 1 tablet (50 mg total) by mouth every 8 (eight) hours as needed for pain.  30 tablet  1  . hydrocortisone cream 1 % Apply 1 application topically 2 (two) times daily as needed. For eczema      . lisinopril-hydrochlorothiazide (PRINZIDE,ZESTORETIC) 20-25 MG per tablet Take 1 tablet by mouth daily.  90 tablet  2  . zolpidem (AMBIEN) 5 MG tablet Take 5-10 mg by mouth at bedtime as needed. For sleep.       No current facility-administered  medications for this visit.    ROS as above otherwise neg.  No chest pain, palpitations, SOB, Fever, Chills, Abd pain, N/V/D.   Objective:   Physical Exam BP 154/108  Pulse 83  Temp(Src) 99.8 F (37.7 C) (Oral)  Ht 5\' 7"  (1.702 m)  Wt 212 lb (96.163 kg)  BMI 33.2 kg/m2 Gen:  Alert, cooperative patient who appears stated age in no acute distress.  Vital signs reviewed. HEENT: EOMI,  MMM Cardiac:  Regular rate and rhythm without murmur auscultated.  Good S1/S2. Pulm:  Clear to auscultation bilaterally with good air movement.  No wheezes or rales noted.   Back:  TTP BL lumbar region.  Mild spasm noted BL. No bony spinous tenderness.  Otherwise benign back exam.  Ext:  No edema  No results found for this or any previous visit (from the past 72  hour(s)).

## 2013-02-28 NOTE — Patient Instructions (Addendum)
I'm sending in the higher dose so you don't have to take 2 pills for the Lisinopril-HCTZ.  Just take this and the Amlodipine once a day.    Otherwise I will see you in about a week for the IUD.    It was good to see you today  Take the Flexeril at night to help with the back pain.  Don't drive after taking this medicine

## 2013-03-01 NOTE — Assessment & Plan Note (Signed)
Endorses taking her medications.   States she is under increased stress at work and concern about whether she has obtained full remission of her cancer.  Has not yet had Medicaid card switched and therefore unsure when she can FU with oncology.   She believes this is source of her hypertension. Continue current dose of Lisinpril-HCTZ plus Norvasc combination.   FU in 1 month or so, recommend FU in pharmacy clinic for ambulatory BP monitoring.

## 2013-03-01 NOTE — Assessment & Plan Note (Signed)
Continue Tramadol. Add Flexeril as muscle relaxer at night.   Will assess for improvement next visit.  No red flags noted.

## 2013-03-07 ENCOUNTER — Encounter: Payer: Self-pay | Admitting: Oncology

## 2013-03-07 ENCOUNTER — Encounter: Payer: Self-pay | Admitting: Family Medicine

## 2013-03-07 ENCOUNTER — Ambulatory Visit: Payer: Medicaid Other | Admitting: Home Health Services

## 2013-03-07 ENCOUNTER — Ambulatory Visit: Payer: Self-pay | Admitting: Home Health Services

## 2013-03-07 ENCOUNTER — Ambulatory Visit (INDEPENDENT_AMBULATORY_CARE_PROVIDER_SITE_OTHER): Payer: Medicaid Other | Admitting: Family Medicine

## 2013-03-07 VITALS — BP 143/95 | HR 101 | Temp 99.2°F | Ht 67.0 in | Wt 212.0 lb

## 2013-03-07 DIAGNOSIS — IMO0001 Reserved for inherently not codable concepts without codable children: Secondary | ICD-10-CM

## 2013-03-07 DIAGNOSIS — Z5320 Procedure and treatment not carried out because of patient's decision for unspecified reasons: Secondary | ICD-10-CM

## 2013-03-07 DIAGNOSIS — Z309 Encounter for contraceptive management, unspecified: Secondary | ICD-10-CM

## 2013-03-07 DIAGNOSIS — R102 Pelvic and perineal pain: Secondary | ICD-10-CM

## 2013-03-07 DIAGNOSIS — Z30432 Encounter for removal of intrauterine contraceptive device: Secondary | ICD-10-CM

## 2013-03-07 DIAGNOSIS — Z30430 Encounter for insertion of intrauterine contraceptive device: Secondary | ICD-10-CM

## 2013-03-07 LAB — POCT URINE PREGNANCY: Preg Test, Ur: NEGATIVE

## 2013-03-07 MED ORDER — NORGESTIMATE-ETH ESTRADIOL 0.25-35 MG-MCG PO TABS: 1.0000 | ORAL_TABLET | Freq: Every day | ORAL | Status: DC

## 2013-03-07 MED ORDER — IBUPROFEN 800 MG PO TABS: 800.0000 mg | ORAL_TABLET | Freq: Three times a day (TID) | ORAL | Status: DC | PRN

## 2013-03-07 MED ORDER — IBUPROFEN 200 MG PO TABS
800.0000 mg | ORAL_TABLET | Freq: Once | ORAL | Status: AC
  Administered 2013-03-07: 800 mg via ORAL

## 2013-03-07 NOTE — Progress Notes (Signed)
Subjective:    [name] is a 32 y.o. female who presents to [facility] today for IUD removal and replacement:  1.  Contraception:  Patient's IUD has been present for 5-1/2 years per her report. She will like to have this removed today and replaced with another Mirena. She has done well with this so far. Her last episode of unprotected sexual intercourse was 3 days ago. Is not had any changes in her partner recently. No vaginal discharge or bleeding.  No abdominal pain.   The following portions of the patient's history were reviewed and updated as appropriate: allergies, current medications, past medical history, family and social history, and problem list. Patient is a nonsmoker.    PMH reviewed.  Past Medical History  Diagnosis Date  . Hypertension   . Complication of anesthesia SLOW TO WAKE UP AFTER C SECTION  . Benign essential HTN 01/19/2012  . Eczema 01/19/2012  . Muscle spasm of back 03/09/2012  . S/P chemotherapy, time since 4-12 weeks 05/11/2012    4 cycles of ABVD with neulasta suppor and zoladex  . Cough, persistent 07/27/2012  . Leukopenia 08/01/2012  . Asthma     seasonal, worse in spring.  Uses inhaler during this time of year  . Migraine   . Hodgkin lymphoma 01/18/2012  . hodgkins lymphoma dx'd 01/16/12    lt axilla and left neck   . Diabetes mellitus   . DM II (diabetes mellitus, type II), controlled 01/19/2012   Past Surgical History  Procedure Laterality Date  . Dilation and curettage of uterus    . Axillary lymph node biopsy  12/2011    left  . Portacath placement  01/25/2012    Procedure: INSERTION PORT-A-CATH;  Surgeon: Atilano Ina, MD,FACS;  Location: [facility];  Service: General;  Laterality: Right;  right subclavian  . Cesarean section  2008    Medications reviewed. Current Outpatient Prescriptions  Medication Sig Dispense Refill  . amLODipine (NORVASC) 2.5 MG tablet Take 1 tablet (2.5 mg total) by mouth daily.  30 tablet  1  . cyclobenzaprine (FLEXERIL) 10  MG tablet Take 1 tablet (10 mg total) by mouth 3 (three) times daily as needed for muscle spasms.  30 tablet  1  . hydrocortisone cream 1 % Apply 1 application topically 2 (two) times daily as needed. For eczema      . ibuprofen (ADVIL,MOTRIN) 800 MG tablet Take 1 tablet (800 mg total) by mouth every 8 (eight) hours as needed for pain.  30 tablet  0  . lisinopril-hydrochlorothiazide (PRINZIDE,ZESTORETIC) 20-25 MG per tablet Take 1 tablet by mouth daily.  90 tablet  2  . LORazepam (ATIVAN) 2 MG tablet Take 1 tablet (2 mg total) by mouth every 6 (six) hours as needed for anxiety (nausea or sleep). For anxiety  30 tablet  3  . metFORMIN (GLUCOPHAGE) 500 MG tablet Take 500 mg by mouth 2 (two) times daily with a meal.        . traMADol (ULTRAM) 50 MG tablet Take 1 tablet (50 mg total) by mouth every 8 (eight) hours as needed for pain.  30 tablet  1  . zolpidem (AMBIEN) 5 MG tablet Take 5-10 mg by mouth at bedtime as needed. For sleep.       No current facility-administered medications for this visit.    ROS as above otherwise neg.  No chest pain, palpitations, SOB, Fever, Chills, Abd pain, N/V/D.   Objective:   Physical Exam BP 143/95  Pulse  101  Temp(Src) 99.2 F (37.3 C) (Oral)  Ht 5\' 7"  (1.702 m)  Wt 212 lb (96.163 kg)  BMI 33.2 kg/m2 Gen:  Alert, cooperative patient who appears stated age in no acute distress.  Vital signs reviewed. HEENT: EOMI,  MMM Gyn:  External genitalia within normal limits.  Vaginal mucosa pink, moist, normal rugae.  Nonfriable cervix without lesions, no discharge or bleeding noted on speculum exam.    Procedure Note: IUD removal: Patient lying supine in lithotomy position.  Using speculum I was able to visualize cervical os as well as IUD strings. Using forceps I was able to grab the strings removed IUD. There did seem to be some initial resistance within the IUD was removed freely. I was surprised at how close the IUD was to the cervical os. Strings are only about  4 cm total in length from base of IUD to tip.  Patient tolerated procedure well without pain.  IUD insertion: Informed consent obtained and timeout performed. She has no questions. Signed copy in chart. Patient already in lithotomy position. Sterile prep and sterile speculum/equipment used. Cervix cleansed X 3 with betadine. Tenaculum used to secure cervix by placement in anterior lip of cervix. Very small amount of blood oozing from this site. Uterine sound used but unable to sound uterus past 4 cm.  Based on how close the IUD was previously to the cervical os, I then attempted to insert the IUD insertor into the cervical os, but encountered resistance about 2 cm inside the os. I attempted 3 separate times after removing and replacing the tenaculum in order to get better visualization of the os but was unable to insert the IUD any further than 2 cm secondary to resistance. At this point we stopped the procedure and I discussed with [name] what was going on. I had been talking with her throughout as I was going along and she understood that there been some difficulty in passing sound as well as placing IUD. Tenaculum and speculum removed. Minimal bleeding from tenaculum site. No blood from cervical os.  She was agreeable with stopping the procedure.       Results for orders placed in visit on 03/07/13 (from the past 72 hour(s))  POCT URINE PREGNANCY     Status: None   Collection Time    03/07/13  1:47 PM      Result Value Range   Preg Test, Ur Negative

## 2013-03-07 NOTE — Progress Notes (Signed)
Mailed form to [employer] attn: J.Singleton.

## 2013-03-07 NOTE — Patient Instructions (Addendum)
We are going to refer you to an OB/Gyn.  I'm not entirely sure why we weren't able to place the IUD today.   Your previous ultrasounds have shown that you have a normal sized uterus.  I was not able to place the uterine probe as far as I should have.  I will send you for a pelvic ultrasound before your appointment with the OB so they can have something to review before you head over.    Make sure you use another form of contraception for next 7 days.

## 2013-03-07 NOTE — Assessment & Plan Note (Signed)
Unfortunately we cannot replace [name]'s IUD today.  I reviewed her past ultrasounds which showed her uterus to be 10 cm in length. Mrs. [name] not in keeping with the fact definitely passed the sound for multiple angles to only 4 cm in depth. Previous ultrasound also revealed that a portion of the IUD was contained within the myometrial wall. I am getting her set up for an ultrasound to ensure she has no fibroids nor scarring from previous IUD that is causing her from having IUD placed this time. We do not have a dilator here in clinic  And therefore I cannot attempt dilation in order to place IUD. Am going to refer her to gynecologist after she has received her pelvic ultrasound they can make further decisions as to whether they want to place another IUD. I have sent in oral contraceptives for her to take until her appointment.  Warned against needing sexual protection for next 7 days. She was having some mild cramping and I gave her 800 mg of ibuprofen here in clinic also sent her a prescription for this as well.

## 2013-03-08 MED ORDER — LEVONORGESTREL 20 MCG/24HR IU IUD
INTRAUTERINE_SYSTEM | Freq: Once | INTRAUTERINE | Status: AC
  Administered 2013-03-07: 16:00:00 via INTRAUTERINE

## 2013-03-08 NOTE — Addendum Note (Signed)
Addended by: Jone Baseman D on: 03/08/2013 08:57 AM   Modules accepted: Orders, Level of Service

## 2013-03-09 ENCOUNTER — Other Ambulatory Visit: Payer: Self-pay | Admitting: Oncology

## 2013-03-09 DIAGNOSIS — C8190 Hodgkin lymphoma, unspecified, unspecified site: Secondary | ICD-10-CM

## 2013-03-11 ENCOUNTER — Telehealth: Payer: Self-pay | Admitting: Oncology

## 2013-03-12 ENCOUNTER — Telehealth: Payer: Self-pay | Admitting: Family Medicine

## 2013-03-12 ENCOUNTER — Ambulatory Visit: Payer: Self-pay | Admitting: Obstetrics

## 2013-03-12 NOTE — Telephone Encounter (Signed)
Patient went to her appt at   10:30 AM [facility] A NEW PATIENT 32 Scheduled      But was told that she would have to make an appt somewhere else because [facility] did not take Medicaid.

## 2013-03-14 ENCOUNTER — Ambulatory Visit (HOSPITAL_COMMUNITY): Payer: Medicaid Other

## 2013-03-14 ENCOUNTER — Ambulatory Visit: Payer: Self-pay | Admitting: Home Health Services

## 2013-03-14 ENCOUNTER — Other Ambulatory Visit (HOSPITAL_COMMUNITY): Payer: Medicaid Other

## 2013-03-20 ENCOUNTER — Other Ambulatory Visit: Payer: Self-pay | Admitting: Lab

## 2013-03-20 ENCOUNTER — Ambulatory Visit (HOSPITAL_COMMUNITY): Payer: Medicaid Other

## 2013-03-21 ENCOUNTER — Ambulatory Visit (HOSPITAL_COMMUNITY)
Admission: RE | Admit: 2013-03-21 | Discharge: 2013-03-21 | Disposition: A | Discharge status: Home / Self Care | Payer: Medicaid Other | Source: Ambulatory Visit | Attending: Family Medicine | Admitting: Family Medicine

## 2013-03-21 DIAGNOSIS — Z30431 Encounter for routine checking of intrauterine contraceptive device: Secondary | ICD-10-CM | POA: Insufficient documentation

## 2013-03-21 DIAGNOSIS — R102 Pelvic and perineal pain: Secondary | ICD-10-CM

## 2013-03-21 DIAGNOSIS — IMO0001 Reserved for inherently not codable concepts without codable children: Secondary | ICD-10-CM

## 2013-03-21 DIAGNOSIS — N949 Unspecified condition associated with female genital organs and menstrual cycle: Secondary | ICD-10-CM | POA: Insufficient documentation

## 2013-03-21 DIAGNOSIS — Z30430 Encounter for insertion of intrauterine contraceptive device: Secondary | ICD-10-CM

## 2013-03-27 ENCOUNTER — Ambulatory Visit: Payer: Self-pay | Admitting: Nurse Practitioner

## 2013-03-28 ENCOUNTER — Ambulatory Visit (HOSPITAL_COMMUNITY)
Admission: RE | Admit: 2013-03-28 | Discharge: 2013-03-28 | Disposition: A | Discharge status: Home / Self Care | Payer: Medicaid Other | Source: Ambulatory Visit | Attending: Oncology | Admitting: Oncology

## 2013-03-28 ENCOUNTER — Telehealth: Payer: Self-pay | Admitting: Oncology

## 2013-03-28 ENCOUNTER — Other Ambulatory Visit (HOSPITAL_BASED_OUTPATIENT_CLINIC_OR_DEPARTMENT_OTHER): Payer: Medicaid Other

## 2013-03-28 ENCOUNTER — Encounter (HOSPITAL_COMMUNITY): Payer: Self-pay

## 2013-03-28 DIAGNOSIS — K449 Diaphragmatic hernia without obstruction or gangrene: Secondary | ICD-10-CM | POA: Insufficient documentation

## 2013-03-28 DIAGNOSIS — I517 Cardiomegaly: Secondary | ICD-10-CM | POA: Insufficient documentation

## 2013-03-28 DIAGNOSIS — C8190 Hodgkin lymphoma, unspecified, unspecified site: Secondary | ICD-10-CM

## 2013-03-28 DIAGNOSIS — J984 Other disorders of lung: Secondary | ICD-10-CM | POA: Insufficient documentation

## 2013-03-28 DIAGNOSIS — R599 Enlarged lymph nodes, unspecified: Secondary | ICD-10-CM | POA: Insufficient documentation

## 2013-03-28 DIAGNOSIS — Z9221 Personal history of antineoplastic chemotherapy: Secondary | ICD-10-CM | POA: Insufficient documentation

## 2013-03-28 DIAGNOSIS — C8118 Nodular sclerosis classical Hodgkin lymphoma, lymph nodes of multiple sites: Secondary | ICD-10-CM

## 2013-03-28 DIAGNOSIS — J479 Bronchiectasis, uncomplicated: Secondary | ICD-10-CM | POA: Insufficient documentation

## 2013-03-28 DIAGNOSIS — Z923 Personal history of irradiation: Secondary | ICD-10-CM | POA: Insufficient documentation

## 2013-03-28 LAB — CBC WITH DIFFERENTIAL/PLATELET
BASO%: 0.9 % (ref 0.0–2.0)
EOS%: 8.4 % — ABNORMAL HIGH (ref 0.0–7.0)
HCT: 35.6 % (ref 34.8–46.6)
LYMPH%: 21.6 % (ref 14.0–49.7)
MCH: 32.8 pg (ref 25.1–34.0)
MCHC: 35.7 g/dL (ref 31.5–36.0)
MONO#: 0.4 10*3/uL (ref 0.1–0.9)
NEUT%: 57.6 % (ref 38.4–76.8)
Platelets: 208 10*3/uL (ref 145–400)
RBC: 3.88 10*6/uL (ref 3.70–5.45)
WBC: 3.4 10*3/uL — ABNORMAL LOW (ref 3.9–10.3)

## 2013-03-28 LAB — SEDIMENTATION RATE: Sed Rate: 30 mm/hr — ABNORMAL HIGH (ref 0–22)

## 2013-03-28 LAB — COMPREHENSIVE METABOLIC PANEL (CC13)
AST: 15 U/L (ref 5–34)
Albumin: 3.2 g/dL — ABNORMAL LOW (ref 3.5–5.0)
Alkaline Phosphatase: 58 U/L (ref 40–150)
BUN: 13.2 mg/dL (ref 7.0–26.0)
Creatinine: 0.8 mg/dL (ref 0.6–1.1)
Glucose: 109 mg/dl — ABNORMAL HIGH (ref 70–99)
Total Bilirubin: 0.37 mg/dL (ref 0.20–1.20)

## 2013-03-28 MED ORDER — IOHEXOL 300 MG/ML  SOLN
80.0000 mL | Freq: Once | INTRAMUSCULAR | Status: AC | PRN
  Administered 2013-03-28: 80 mL via INTRAVENOUS

## 2013-03-28 NOTE — Telephone Encounter (Signed)
pt came in and needed to r/s lab and est..Marland KitchenMarland KitchenDone

## 2013-04-04 ENCOUNTER — Telehealth: Payer: Self-pay | Admitting: *Deleted

## 2013-04-04 ENCOUNTER — Ambulatory Visit (HOSPITAL_BASED_OUTPATIENT_CLINIC_OR_DEPARTMENT_OTHER): Payer: Medicaid Other | Admitting: Nurse Practitioner

## 2013-04-04 ENCOUNTER — Telehealth: Payer: Self-pay | Admitting: Oncology

## 2013-04-04 VITALS — BP 160/102 | HR 72 | Temp 97.8°F | Resp 18 | Ht 67.0 in | Wt 219.9 lb

## 2013-04-04 DIAGNOSIS — E119 Type 2 diabetes mellitus without complications: Secondary | ICD-10-CM

## 2013-04-04 DIAGNOSIS — I10 Essential (primary) hypertension: Secondary | ICD-10-CM

## 2013-04-04 DIAGNOSIS — C8118 Nodular sclerosis classical Hodgkin lymphoma, lymph nodes of multiple sites: Secondary | ICD-10-CM

## 2013-04-04 DIAGNOSIS — C8190 Hodgkin lymphoma, unspecified, unspecified site: Secondary | ICD-10-CM

## 2013-04-04 DIAGNOSIS — D72819 Decreased white blood cell count, unspecified: Secondary | ICD-10-CM

## 2013-04-04 NOTE — Progress Notes (Signed)
OFFICE PROGRESS NOTE  Interval history:  [name] is a 32 year old woman diagnosed with Hodgkin's lymphoma very 2013 when she presented with an enlarged left axillary lymph node, fevers and 20 pound weight loss. Biopsy 01/12/2012 showed nodular sclerosing Hodgkin's lymphoma. On initial physical exam she had bulky bilateral cervical, supraclavicular and axillary lymphadenopathy with massive adenopathy in the left axilla. There was no inguinal adenopathy no organomegaly. CT scans did not show any disease below the diaphragm. PET scan on 01/24/2012 showed intensely hypermetabolic cervical and supraclavicular lymphadenopathy as well as bulky left axillary lymphadenopathy; mild activity within the anterior mediastinum and lower paratracheal nodes as well as small right axillary nodes. There was no evidence of abnormal activity within the solid organs or activity below the hemidiaphragms. The spleen was normal She completed ABVD chemotherapy x4 cycles 02/13/2012 through 05/12/2012 followed by involved field radiation 06/18/2012 through 07/16/2012. Course was complicated by bilateral interstitial pneumonia requiring hospitalization August 2013.  She is seen today for scheduled followup. She feels well. No fevers or sweats. She has a good appetite. She remains very active. She is working 2 jobs. No shortness of breath or cough. No bowel or bladder problems. She has not noticed any enlarged lymph nodes.   Objective: Blood pressure 160/102, pulse 72, temperature 97.8 F (36.6 C), temperature source Oral, resp. rate 18, height 5\' 7"  (1.702 m), weight 219 lb 14.4 oz (99.746 kg), last menstrual period 01/17/2013. she forgot to take her blood pressure medication this morning.  Oropharynx is without thrush or ulceration. No palpable cervical, supraclavicular, axillary or inguinal lymph nodes. Lungs are clear. No wheezes or rales. Regular cardiac rhythm. Port-A-Cath site is without erythema. Abdomen is soft and  nontender. No organomegaly. Extremities are without edema. Calves soft and nontender. Motor strength 5 over 5.  Lab Results: Lab Results  Component Value Date   WBC 3.4* 03/28/2013   HGB 12.7 03/28/2013   HCT 35.6 03/28/2013   MCV 91.7 03/28/2013   PLT 208 03/28/2013    Chemistry:    Chemistry      Component Value Date/Time   NA 140 03/28/2013 0954   NA 138 02/07/2013 0918   NA 139 05/28/2012 0818   K 4.1 03/28/2013 0954   K 4.4 02/07/2013 0918   K 4.6 05/28/2012 0818   CL 106 03/28/2013 0954   CL 104 02/07/2013 0918   CL 95* 05/28/2012 0818   CO2 24 03/28/2013 0954   CO2 26 02/07/2013 0918   CO2 28 05/28/2012 0818   BUN 13.2 03/28/2013 0954   BUN 13 02/07/2013 0918   BUN 11 05/28/2012 0818   CREATININE 0.8 03/28/2013 0954   CREATININE 0.80 02/07/2013 0918   CREATININE 0.84 08/02/2012 0446   CREATININE 0.67 10/08/2007 1250      Component Value Date/Time   CALCIUM 8.8 03/28/2013 0954   CALCIUM 9.4 02/07/2013 0918   CALCIUM 9.3 05/28/2012 0818   ALKPHOS 58 03/28/2013 0954   ALKPHOS 75 02/07/2013 0918   ALKPHOS 71 05/28/2012 0818   AST 15 03/28/2013 0954   AST 15 02/07/2013 0918   AST 24 05/28/2012 0818   ALT 22 03/28/2013 0954   ALT 15 02/07/2013 0918   BILITOT 0.37 03/28/2013 0954   BILITOT 0.8 02/07/2013 0918   BILITOT 0.60 05/28/2012 0818       Studies/Results: Ct Chest W Contrast  03/28/2013  *RADIOLOGY REPORT*  Clinical Data: History of Hodgkin's lymphoma.  Chemotherapy and radiation therapy complete.  Follow-up study.  CT CHEST WITH CONTRAST  Technique:  Multidetector CT imaging of the chest was performed following the standard protocol during bolus administration of intravenous contrast.  Contrast: 80mL OMNIPAQUE IOHEXOL 300 MG/ML  SOLN  Comparison: Chest CT 07/27/2012.  Findings:  Mediastinum: There is some amorphous soft tissue thickening throughout the superior aspect of the anterior mediastinum which is less prominent than the prior study.  No definite mediastinal or hilar lymphadenopathy is noted on today's  examination to suggest residual or recurrent disease. Heart size is mildly enlarged with left ventricular dilatation that has clearly increased compared to the prior examination. There is no significant pericardial fluid, thickening or pericardial calcification.  Right subclavian single lumen Port-A-Cath with tip terminating in the distal superior vena cava.  Small hiatal hernia.  Lungs/Pleura: Patchy multifocal peribronchovascular ground-glass attenuation nodularity noted on the prior examination has largely resolved.  There is an area of extensive architectural distortion, mild traction bronchiectasis and volume loss in the left apex, most compatible with evolving postradiation changes.  Bibasilar scarring is noted.  There is also likely an area of scarring in the posterior aspect of the superior segment of the right lower lobe. No new acute consolidative airspace disease.  No pleural effusions.  Upper Abdomen: Unremarkable.  Musculoskeletal: Previously noted left axillary lymphadenopathy continues to decrease compared to prior examinations, with the largest single left axillary lymph node measuring only 9 mm in short axis on today's study. There are no aggressive appearing lytic or blastic lesions noted in the visualized portions of the skeleton.  IMPRESSION: 1.  Post treatment related changes in the mediastinum and left upper hemithorax, as above, without findings to suggest residual or recurrent disease. 2.  Interval development of cardiomegaly with left ventricular dilatation compared to the prior study.  This may be related to chemotherapy.  Clinical correlation is recommended. 3.  Small hiatal hernia.   Original Report Authenticated By: Trudie Reed, M.D.    US Transvaginal Non-ob  03/21/2013  *RADIOLOGY REPORT*  Clinical Data: Pelvic pain.  Previous IUD located within the myometrial wall.  Unable to pass new IUD.  TRANSABDOMINAL AND TRANSVAGINAL ULTRASOUND OF PELVIS Technique:  Both transabdominal and  transvaginal ultrasound examinations of the pelvis were performed. Transabdominal technique was performed for global imaging of the pelvis including uterus, ovaries, adnexal regions, and pelvic cul-de-sac.  It was necessary to proceed with endovaginal exam following the transabdominal exam to visualize the myometrium, endometrium and adnexa.  Comparison:  06/03/2009  Findings:  Uterus: Is anteverted and anteflexed and demonstrates a sagittal length of 9.3 cm, depth of 4.3 cm and width of 5.8 cm.  A homogeneous myometrium is seen  Endometrium: Appears thin with a width of 4.2 mm.  No areas of focal thickening or heterogeneity are seen.  Right ovary:  Has a normal appearance measuring 3.5 x 1.3 x 1.1 cm  Left ovary: Has a normal appearance measuring 4.5 x 1.9 x 1.9 cm and containing a corpus luteum  Other findings: No pelvic fluid or separate adnexal masses are seen.  IMPRESSION: Normal pelvic ultrasound.   Original Report Authenticated By: Rhodia Albright, M.D.    US Pelvis Complete  03/21/2013  *RADIOLOGY REPORT*  Clinical Data: Pelvic pain.  Previous IUD located within the myometrial wall.  Unable to pass new IUD.  TRANSABDOMINAL AND TRANSVAGINAL ULTRASOUND OF PELVIS Technique:  Both transabdominal and transvaginal ultrasound examinations of the pelvis were performed. Transabdominal technique was performed for global imaging of the pelvis including uterus, ovaries, adnexal regions, and pelvic cul-de-sac.  It was necessary  to proceed with endovaginal exam following the transabdominal exam to visualize the myometrium, endometrium and adnexa.  Comparison:  06/03/2009  Findings:  Uterus: Is anteverted and anteflexed and demonstrates a sagittal length of 9.3 cm, depth of 4.3 cm and width of 5.8 cm.  A homogeneous myometrium is seen  Endometrium: Appears thin with a width of 4.2 mm.  No areas of focal thickening or heterogeneity are seen.  Right ovary:  Has a normal appearance measuring 3.5 x 1.3 x 1.1 cm  Left ovary:  Has a normal appearance measuring 4.5 x 1.9 x 1.9 cm and containing a corpus luteum  Other findings: No pelvic fluid or separate adnexal masses are seen.  IMPRESSION: Normal pelvic ultrasound.   Original Report Authenticated By: Rhodia Albright, M.D.     Medications: I have reviewed the patient's current medications.  Assessment/Plan:  1. Stage IIB nodular sclerosing Hodgkin's lymphoma with specifics of diagnosis and treatment as outlined above. 2. History of bilateral interstitial pneumonia August 2013. 3. Mild leukopenia, persistent. 4. Hypertension. 5. Type 2 diabetes.  Disposition-Ms. Brinkley appears well. Recent restaging chest CT negative for evidence of recurrent Hodgkin's. Plan next restaging evaluation at a four-month interval with a followup visit approximately one week later to review the results.   We are referring her to Dr. Andrey Campanile for removal of the Port-A-Cath.  She will contact the office prior to her next visit with any problems.  [name] ANP/GNP-BC

## 2013-04-04 NOTE — Telephone Encounter (Signed)
GV AND PRINTED APPT SCHED AND AVS FOR PT....PT SCHED TO SEE dR wILSON ON 6.11 @ 10:00AM ..Marland KitchenMarland KitchenGV PT BARIUM

## 2013-04-04 NOTE — Telephone Encounter (Signed)
Pt given report of CT "negative for recurrent Hodgkin's" per Dr. Cyndie Chime.

## 2013-04-04 NOTE — Telephone Encounter (Signed)
Message copied by Sabino Snipes on Thu Apr 04, 2013 11:09 AM ------      Message from: Levert Feinstein      Created: Thu Mar 28, 2013  1:55 PM       Call pt: CT negative for recurrent Hodgkin's ------

## 2013-04-05 ENCOUNTER — Telehealth: Payer: Self-pay | Admitting: Nurse Practitioner

## 2013-04-05 DIAGNOSIS — C8190 Hodgkin lymphoma, unspecified, unspecified site: Secondary | ICD-10-CM

## 2013-04-05 NOTE — Telephone Encounter (Signed)
Reviewed CT scan finding from 03/28/2013 of interval development of cardiomegaly with Ms. Zucco. Dr. Cyndie Chime recommends a MUGA scan. She is agreeable. Order entered into EPIC and POF submitted.

## 2013-04-08 ENCOUNTER — Telehealth: Payer: Self-pay | Admitting: Family Medicine

## 2013-04-08 NOTE — Telephone Encounter (Signed)
Pt had US done 2 weeks ago and has not heard any results - needs to know asap

## 2013-04-08 NOTE — Telephone Encounter (Signed)
Please advise.Thank you.Pura Picinich S  

## 2013-04-08 NOTE — Telephone Encounter (Signed)
Ultrasound was done for Gynecology referral.  We could not place IUD here in clinic.  Ultrasound was completely normal, you can tell patient this, it was done more for the gynecologist's information when they try to place an IUD.  Has she made appt with Gyn?

## 2013-04-09 NOTE — Telephone Encounter (Signed)
Patient states she's needing a new Ref because medicaid was not excepted by [facility]

## 2013-04-09 NOTE — Telephone Encounter (Signed)
Related message.She did state that she was unable to keep her GYN appt because medicaid was not accepted.I informed her that  we will  [facility] since [facility] accept medicaid and we will call her back once appt scheduled.she voiced understanding.Marland Kitchen Jaedah Lords, Virgel Bouquet

## 2013-04-11 ENCOUNTER — Encounter (HOSPITAL_COMMUNITY)
Admission: RE | Admit: 2013-04-11 | Discharge: 2013-04-11 | Disposition: A | Discharge status: Home / Self Care | Payer: Medicaid Other | Source: Ambulatory Visit | Attending: Nurse Practitioner | Admitting: Nurse Practitioner

## 2013-04-11 DIAGNOSIS — C8190 Hodgkin lymphoma, unspecified, unspecified site: Secondary | ICD-10-CM

## 2013-04-11 MED ORDER — TECHNETIUM TC 99M-LABELED RED BLOOD CELLS IV KIT
25.1000 | PACK | Freq: Once | INTRAVENOUS | Status: AC | PRN
  Administered 2013-04-11: 25.1 via INTRAVENOUS

## 2013-04-12 ENCOUNTER — Encounter (HOSPITAL_COMMUNITY): Payer: Self-pay | Admitting: Emergency Medicine

## 2013-04-12 ENCOUNTER — Emergency Department (HOSPITAL_COMMUNITY)
Admission: EM | Admit: 2013-04-12 | Discharge: 2013-04-12 | Disposition: A | Discharge status: Home / Self Care | Payer: Medicaid Other | Attending: Emergency Medicine | Admitting: Emergency Medicine

## 2013-04-12 ENCOUNTER — Emergency Department (HOSPITAL_COMMUNITY): Payer: Medicaid Other

## 2013-04-12 DIAGNOSIS — Z880 Allergy status to penicillin: Secondary | ICD-10-CM | POA: Insufficient documentation

## 2013-04-12 DIAGNOSIS — Z79899 Other long term (current) drug therapy: Secondary | ICD-10-CM | POA: Insufficient documentation

## 2013-04-12 DIAGNOSIS — Z862 Personal history of diseases of the blood and blood-forming organs and certain disorders involving the immune mechanism: Secondary | ICD-10-CM | POA: Insufficient documentation

## 2013-04-12 DIAGNOSIS — Z87891 Personal history of nicotine dependence: Secondary | ICD-10-CM | POA: Insufficient documentation

## 2013-04-12 DIAGNOSIS — Z872 Personal history of diseases of the skin and subcutaneous tissue: Secondary | ICD-10-CM | POA: Insufficient documentation

## 2013-04-12 DIAGNOSIS — R05 Cough: Secondary | ICD-10-CM | POA: Insufficient documentation

## 2013-04-12 DIAGNOSIS — I10 Essential (primary) hypertension: Secondary | ICD-10-CM | POA: Insufficient documentation

## 2013-04-12 DIAGNOSIS — J45909 Unspecified asthma, uncomplicated: Secondary | ICD-10-CM | POA: Insufficient documentation

## 2013-04-12 DIAGNOSIS — E119 Type 2 diabetes mellitus without complications: Secondary | ICD-10-CM | POA: Insufficient documentation

## 2013-04-12 DIAGNOSIS — R059 Cough, unspecified: Secondary | ICD-10-CM

## 2013-04-12 MED ORDER — HYDROCODONE-ACETAMINOPHEN 7.5-500 MG/15ML PO SOLN: 15.0000 mL | ORAL | Status: DC | PRN

## 2013-04-12 NOTE — ED Notes (Signed)
Chest pain is consistent but hurts worse when she coughs

## 2013-04-12 NOTE — ED Provider Notes (Signed)
History     CSN: [id number]  Arrival date & time 04/12/13  0930   First MD Initiated Contact with Patient 04/12/13 [phone]      Chief Complaint  Patient presents with  . chest congestion     (Consider location/radiation/quality/duration/timing/severity/associated sxs/prior treatment) Patient is a 32 y.o. female presenting with URI. The history is provided by the patient.  URI  patient here complaining of 3 day cough which is been nonproductive and congestion. She does has just lost her voice. Denies any dyspnea. No nausea vomiting. No diarrhea. Slight ear pain noted without severe sore throat. Has used over-the-counter medications without relief. Has had some subjective fever. Denies any urinary symptoms. Symptoms have been persistent and nothing makes it worse  Past Medical History  Diagnosis Date  . Hypertension   . Complication of anesthesia SLOW TO WAKE UP AFTER C SECTION  . Benign essential HTN 01/19/2012  . Eczema 01/19/2012  . Muscle spasm of back 03/09/2012  . S/P chemotherapy, time since 4-12 weeks 05/11/2012    4 cycles of ABVD with neulasta suppor and zoladex  . Cough, persistent 07/27/2012  . Leukopenia 08/01/2012  . Asthma     seasonal, worse in spring.  Uses inhaler during this time of year  . Migraine   . Hodgkin lymphoma 01/18/2012  . hodgkins lymphoma dx'd 01/16/12    lt axilla and left neck   . Diabetes mellitus   . DM II (diabetes mellitus, type II), controlled 01/19/2012    Past Surgical History  Procedure Laterality Date  . Dilation and curettage of uterus    . Axillary lymph node biopsy  12/2011    left  . Portacath placement  01/25/2012    Procedure: INSERTION PORT-A-CATH;  Surgeon: Atilano Ina, MD,FACS;  Location: [facility];  Service: General;  Laterality: Right;  right subclavian  . Cesarean section  2008    Family History  Problem Relation Age of Onset  . Diabetes Maternal Grandmother   . Heart disease Maternal Grandfather   . Cancer Neg Hx      History  Substance Use Topics  . Smoking status: Former Smoker    Quit date: 11/19/2006  . Smokeless tobacco: Never Used  . Alcohol Use: No    OB History   Grav Para Term Preterm Abortions TAB SAB Ect Mult Living   3 1 1  2  0 2 0 0 1      Review of Systems  All other systems reviewed and are negative.    Allergies  Penicillins  Home Medications   Current Outpatient Rx  Name  Route  Sig  Dispense  Refill  . amLODipine (NORVASC) 2.5 MG tablet   Oral   Take 1 tablet (2.5 mg total) by mouth daily.   30 tablet   1   . cyclobenzaprine (FLEXERIL) 10 MG tablet   Oral   Take 1 tablet (10 mg total) by mouth 3 (three) times daily as needed for muscle spasms.   30 tablet   1   . hydrocortisone cream 1 %   Topical   Apply 1 application topically 2 (two) times daily as needed. For eczema         . ibuprofen (ADVIL,MOTRIN) 800 MG tablet   Oral   Take 1 tablet (800 mg total) by mouth every 8 (eight) hours as needed for pain.   30 tablet   0   . lisinopril-hydrochlorothiazide (PRINZIDE,ZESTORETIC) 20-25 MG per tablet   Oral   Take  1 tablet by mouth daily.   90 tablet   2   . LORazepam (ATIVAN) 2 MG tablet   Oral   Take 1 tablet (2 mg total) by mouth every 6 (six) hours as needed for anxiety (nausea or sleep). For anxiety   30 tablet   3     This script was given to pt on 01/18/12.   Marland Kitchen metFORMIN (GLUCOPHAGE) 500 MG tablet   Oral   Take 500 mg by mouth 2 (two) times daily with a meal.           . norgestimate-ethinyl estradiol (ORTHO-CYCLEN,SPRINTEC,PREVIFEM) 0.25-35 MG-MCG tablet   Oral   Take 1 tablet by mouth daily.   1 Package   2   . traMADol (ULTRAM) 50 MG tablet   Oral   Take 1 tablet (50 mg total) by mouth every 8 (eight) hours as needed for pain.   30 tablet   1   . zolpidem (AMBIEN) 5 MG tablet   Oral   Take 5-10 mg by mouth at bedtime as needed. For sleep.           BP 162/116  Pulse 82  Temp(Src) 99.7 F (37.6 C) (Oral)  Resp  20  SpO2 99%  LMP 04/02/2013  Physical Exam  Nursing note and vitals reviewed. Constitutional: She is oriented to person, place, and time. She appears well-developed and well-nourished.  Non-toxic appearance. No distress.  HENT:  Head: Normocephalic and atraumatic.  Eyes: Conjunctivae, EOM and lids are normal. Pupils are equal, round, and reactive to light.  Neck: Normal range of motion. Neck supple. No tracheal deviation present. No mass present.  Cardiovascular: Normal rate, regular rhythm and normal heart sounds.  Exam reveals no gallop.   No murmur heard. Pulmonary/Chest: Effort normal and breath sounds normal. No stridor. No respiratory distress. She has no decreased breath sounds. She has no wheezes. She has no rhonchi. She has no rales.  Abdominal: Soft. Normal appearance and bowel sounds are normal. She exhibits no distension. There is no tenderness. There is no rebound and no CVA tenderness.  Musculoskeletal: Normal range of motion. She exhibits no edema and no tenderness.  Neurological: She is alert and oriented to person, place, and time. She has normal strength. No cranial nerve deficit or sensory deficit. GCS eye subscore is 4. GCS verbal subscore is 5. GCS motor subscore is 6.  Skin: Skin is warm and dry. No abrasion and no rash noted.  Psychiatric: She has a normal mood and affect. Her speech is normal and behavior is normal.    ED Course  Procedures (including critical care time)  Labs Reviewed - No data to display Nm Cardiac Muga Rest  04/11/2013   *RADIOLOGY REPORT*  Clinical Data: Hodgkin's disease post chemotherapy and radiation therapy, new cardiomegaly and left ventricular dilatation, history hypertension, asthma, diabetes  NUCLEAR MEDICINE CARDIAC MULTIPLE UPTAKE GATED ACQUISITION SCAN (CARDIAC MUGA)  Technique: Radiolabeled red blood cells used to perform resting radionuclide ventriculography. Imaging performed in the anterior, LAO, and lateral projections.  Resting  left ventricular ejection fraction estimated after drawing region of interest curves around the left ventricle during systole and diastole.  Radiopharmaceutical: 25 mCi Tc-60m pertechnetate labeled autologous red cells  Comparison: None  Findings: Left ventricular ejection fraction is calculated at 62%. Acquisition performed at a cardiac rate of 70 beats per minute. Wall motion analysis in three projections shows a minimally dilated left ventricle with normal left ventricular wall motion.  IMPRESSION: Normal left ventricular  ejection fraction of 62%. Normal left ventricular wall motion, though left ventricle does appear minimally dilated.   Original Report Authenticated By: Ulyses Southward, M.D.     No diagnosis found.    MDM  Pt without acute changes to her cxr--sx likely from uri, no signs of chf, pt notes bronchitic type chest discomfort, will tx        Toy Baker, MD 04/12/13 1102

## 2013-04-12 NOTE — ED Notes (Signed)
Per patient, symptoms started 3 days ago-lost voice this am-productive cough, congestion, feels feverish

## 2013-04-15 ENCOUNTER — Emergency Department (HOSPITAL_COMMUNITY): Payer: Medicaid Other

## 2013-04-15 ENCOUNTER — Other Ambulatory Visit: Payer: Self-pay

## 2013-04-15 ENCOUNTER — Encounter (HOSPITAL_COMMUNITY): Payer: Self-pay | Admitting: Emergency Medicine

## 2013-04-15 ENCOUNTER — Emergency Department (HOSPITAL_COMMUNITY)
Admission: EM | Admit: 2013-04-15 | Discharge: 2013-04-15 | Disposition: A | Discharge status: Home / Self Care | Payer: Medicaid Other | Attending: Emergency Medicine | Admitting: Emergency Medicine

## 2013-04-15 DIAGNOSIS — Z8739 Personal history of other diseases of the musculoskeletal system and connective tissue: Secondary | ICD-10-CM | POA: Insufficient documentation

## 2013-04-15 DIAGNOSIS — Z862 Personal history of diseases of the blood and blood-forming organs and certain disorders involving the immune mechanism: Secondary | ICD-10-CM | POA: Insufficient documentation

## 2013-04-15 DIAGNOSIS — Z79899 Other long term (current) drug therapy: Secondary | ICD-10-CM | POA: Insufficient documentation

## 2013-04-15 DIAGNOSIS — J159 Unspecified bacterial pneumonia: Secondary | ICD-10-CM | POA: Insufficient documentation

## 2013-04-15 DIAGNOSIS — J45909 Unspecified asthma, uncomplicated: Secondary | ICD-10-CM | POA: Insufficient documentation

## 2013-04-15 DIAGNOSIS — R61 Generalized hyperhidrosis: Secondary | ICD-10-CM | POA: Insufficient documentation

## 2013-04-15 DIAGNOSIS — Z3202 Encounter for pregnancy test, result negative: Secondary | ICD-10-CM | POA: Insufficient documentation

## 2013-04-15 DIAGNOSIS — R011 Cardiac murmur, unspecified: Secondary | ICD-10-CM | POA: Insufficient documentation

## 2013-04-15 DIAGNOSIS — Z87891 Personal history of nicotine dependence: Secondary | ICD-10-CM | POA: Insufficient documentation

## 2013-04-15 DIAGNOSIS — E119 Type 2 diabetes mellitus without complications: Secondary | ICD-10-CM | POA: Insufficient documentation

## 2013-04-15 DIAGNOSIS — R05 Cough: Secondary | ICD-10-CM | POA: Insufficient documentation

## 2013-04-15 DIAGNOSIS — Z872 Personal history of diseases of the skin and subcutaneous tissue: Secondary | ICD-10-CM | POA: Insufficient documentation

## 2013-04-15 DIAGNOSIS — Z8571 Personal history of Hodgkin lymphoma: Secondary | ICD-10-CM | POA: Insufficient documentation

## 2013-04-15 DIAGNOSIS — R059 Cough, unspecified: Secondary | ICD-10-CM | POA: Insufficient documentation

## 2013-04-15 DIAGNOSIS — Z9221 Personal history of antineoplastic chemotherapy: Secondary | ICD-10-CM | POA: Insufficient documentation

## 2013-04-15 DIAGNOSIS — Z880 Allergy status to penicillin: Secondary | ICD-10-CM | POA: Insufficient documentation

## 2013-04-15 DIAGNOSIS — Z8679 Personal history of other diseases of the circulatory system: Secondary | ICD-10-CM | POA: Insufficient documentation

## 2013-04-15 DIAGNOSIS — R110 Nausea: Secondary | ICD-10-CM | POA: Insufficient documentation

## 2013-04-15 DIAGNOSIS — J189 Pneumonia, unspecified organism: Secondary | ICD-10-CM

## 2013-04-15 DIAGNOSIS — I10 Essential (primary) hypertension: Secondary | ICD-10-CM | POA: Insufficient documentation

## 2013-04-15 DIAGNOSIS — R0789 Other chest pain: Secondary | ICD-10-CM | POA: Insufficient documentation

## 2013-04-15 LAB — TROPONIN I: Troponin I: <0.3 ng/mL (ref <0.30)

## 2013-04-15 LAB — URINALYSIS, ROUTINE W REFLEX MICROSCOPIC
Glucose, UA: NEGATIVE mg/dL
Nitrite: NEGATIVE
Specific Gravity, Urine: 1.025 (ref 1.005–1.030)
pH: 6 (ref 5.0–8.0)

## 2013-04-15 LAB — BASIC METABOLIC PANEL
CO2: 21 mEq/L (ref 19–32)
Chloride: 98 mEq/L (ref 96–112)
Creatinine, Ser: 0.77 mg/dL (ref 0.50–1.10)
Glucose, Bld: 133 mg/dL — ABNORMAL HIGH (ref 70–99)

## 2013-04-15 LAB — CBC WITH DIFFERENTIAL/PLATELET
Basophils Absolute: 0 10*3/uL (ref 0.0–0.1)
HCT: 36.4 % (ref 36.0–46.0)
Hemoglobin: 13.1 g/dL (ref 12.0–15.0)
Lymphocytes Relative: 8 % — ABNORMAL LOW (ref 12–46)
Lymphs Abs: 1 10*3/uL (ref 0.7–4.0)
MCV: 90.1 fL (ref 78.0–100.0)
Monocytes Absolute: 1.2 10*3/uL — ABNORMAL HIGH (ref 0.1–1.0)
Monocytes Relative: 9 % (ref 3–12)
Neutro Abs: 10.2 10*3/uL — ABNORMAL HIGH (ref 1.7–7.7)
RBC: 4.04 MIL/uL (ref 3.87–5.11)
WBC: 12.4 10*3/uL — ABNORMAL HIGH (ref 4.0–10.5)

## 2013-04-15 LAB — PRO B NATRIURETIC PEPTIDE: Pro B Natriuretic peptide (BNP): 189.6 pg/mL — ABNORMAL HIGH (ref 0–125)

## 2013-04-15 LAB — D-DIMER, QUANTITATIVE: D-Dimer, Quant: 0.63 ug/mL-FEU — ABNORMAL HIGH (ref 0.00–0.48)

## 2013-04-15 LAB — URINE MICROSCOPIC-ADD ON

## 2013-04-15 MED ORDER — MORPHINE SULFATE 4 MG/ML IJ SOLN
4.0000 mg | Freq: Once | INTRAMUSCULAR | Status: AC
  Administered 2013-04-15: 4 mg via INTRAVENOUS
  Filled 2013-04-15: qty 1

## 2013-04-15 MED ORDER — ONDANSETRON HCL 4 MG/2ML IJ SOLN
4.0000 mg | Freq: Once | INTRAMUSCULAR | Status: AC
  Administered 2013-04-15: 4 mg via INTRAVENOUS
  Filled 2013-04-15: qty 2

## 2013-04-15 MED ORDER — SODIUM CHLORIDE 0.9 % IV SOLN
Freq: Once | INTRAVENOUS | Status: AC
  Administered 2013-04-15: 13:00:00 via INTRAVENOUS

## 2013-04-15 MED ORDER — AZITHROMYCIN 250 MG PO TABS: 250.0000 mg | ORAL_TABLET | Freq: Every day | ORAL | Status: DC

## 2013-04-15 MED ORDER — IOHEXOL 350 MG/ML SOLN
100.0000 mL | Freq: Once | INTRAVENOUS | Status: AC | PRN
  Administered 2013-04-15: 100 mL via INTRAVENOUS

## 2013-04-15 NOTE — ED Notes (Signed)
Received pt via EMS with c/o Left sided chest pain onset 0800 today. Pain with palpation and inspiration. Pt denies n/v. Pt given 324mg  of ASA and 2 nitro by EMS.

## 2013-04-15 NOTE — ED Provider Notes (Signed)
History     CSN: [id number]  Arrival date & time 04/15/13  1053   First MD Initiated Contact with Patient 04/15/13 1103      Chief Complaint  Patient presents with  . Chest Pain    (Consider location/radiation/quality/duration/timing/severity/associated sxs/prior treatment) HPI Comments: Pt with hx of Hodgkin's s/p chemo 1 year ago, in remission, Cardiomegaly, IDDM, HTN comes in with cc of chest pain. Pt started having left sided chest pain this am, unprovoked, and it is worse with palpation, breathing and movement. There was some associated diaphoresis earlier and pt admits to dib. Pt has no hx of CAD, or provocative cardiac testing in the past. She has no hx of PE or DVT. Pt does admit having cough recently, for which she was treated with minimal relief.  Patient is a 32 y.o. female presenting with chest pain. The history is provided by the patient.  Chest Pain Associated symptoms: diaphoresis and nausea   Associated symptoms: no abdominal pain, no cough, no shortness of breath and not vomiting     Past Medical History  Diagnosis Date  . Hypertension   . Complication of anesthesia SLOW TO WAKE UP AFTER C SECTION  . Benign essential HTN 01/19/2012  . Eczema 01/19/2012  . Muscle spasm of back 03/09/2012  . S/P chemotherapy, time since 4-12 weeks 05/11/2012    4 cycles of ABVD with neulasta suppor and zoladex  . Cough, persistent 07/27/2012  . Leukopenia 08/01/2012  . Asthma     seasonal, worse in spring.  Uses inhaler during this time of year  . Migraine   . Hodgkin lymphoma 01/18/2012  . hodgkins lymphoma dx'd 01/16/12    lt axilla and left neck   . Diabetes mellitus   . DM II (diabetes mellitus, type II), controlled 01/19/2012    Past Surgical History  Procedure Laterality Date  . Dilation and curettage of uterus    . Axillary lymph node biopsy  12/2011    left  . Portacath placement  01/25/2012    Procedure: INSERTION PORT-A-CATH;  Surgeon: Atilano Ina, MD,FACS;  Location:  [facility];  Service: General;  Laterality: Right;  right subclavian  . Cesarean section  2008    Family History  Problem Relation Age of Onset  . Diabetes Maternal Grandmother   . Heart disease Maternal Grandfather   . Cancer Neg Hx     History  Substance Use Topics  . Smoking status: Former Smoker    Quit date: 11/19/2006  . Smokeless tobacco: Never Used  . Alcohol Use: No    OB History   Grav Para Term Preterm Abortions TAB SAB Ect Mult Living   3 1 1  2  0 2 0 0 1      Review of Systems  Constitutional: Positive for diaphoresis. Negative for activity change.  HENT: Negative for facial swelling and neck pain.   Respiratory: Negative for cough, shortness of breath and wheezing.   Cardiovascular: Positive for chest pain.  Gastrointestinal: Positive for nausea. Negative for vomiting, abdominal pain, diarrhea, constipation, blood in stool and abdominal distention.  Genitourinary: Negative for hematuria and difficulty urinating.  Skin: Negative for color change.  Neurological: Negative for speech difficulty and light-headedness.  Hematological: Does not bruise/bleed easily.  Psychiatric/Behavioral: Negative for confusion.    Allergies  Penicillins  Home Medications   Current Outpatient Rx  Name  Route  Sig  Dispense  Refill  . amLODipine (NORVASC) 2.5 MG tablet   Oral   Take  1 tablet (2.5 mg total) by mouth daily.   30 tablet   1   . cyclobenzaprine (FLEXERIL) 10 MG tablet   Oral   Take 1 tablet (10 mg total) by mouth 3 (three) times daily as needed for muscle spasms.   30 tablet   1   . HYDROcodone-acetaminophen (LORTAB) 7.5-500 MG/15ML solution   Oral   Take 15 mLs by mouth every 4 (four) hours as needed for pain or cough.   120 mL   0   . hydrocortisone cream 1 %   Topical   Apply 1 application topically 2 (two) times daily as needed. For eczema         . lisinopril-hydrochlorothiazide (PRINZIDE,ZESTORETIC) 20-25 MG per tablet   Oral   Take 1 tablet  by mouth daily.   90 tablet   2   . metFORMIN (GLUCOPHAGE) 500 MG tablet   Oral   Take 500 mg by mouth 2 (two) times daily with a meal.           . norgestimate-ethinyl estradiol (ORTHO-CYCLEN,SPRINTEC,PREVIFEM) 0.25-35 MG-MCG tablet   Oral   Take 1 tablet by mouth daily.   1 Package   2   . traMADol (ULTRAM) 50 MG tablet   Oral   Take 1 tablet (50 mg total) by mouth every 8 (eight) hours as needed for pain.   30 tablet   1   . zolpidem (AMBIEN) 5 MG tablet   Oral   Take 5-10 mg by mouth at bedtime as needed. For sleep.           BP 150/90  Pulse 89  Temp(Src) 97.8 F (36.6 C) (Oral)  Resp 25  SpO2 98%  LMP 04/02/2013  Physical Exam  Nursing note and vitals reviewed. Constitutional: She is oriented to person, place, and time. She appears well-developed and well-nourished.  HENT:  Head: Normocephalic and atraumatic.  Eyes: EOM are normal. Pupils are equal, round, and reactive to light.  Neck: Neck supple.  Cardiovascular: Normal rate and regular rhythm.   Murmur heard. Pulmonary/Chest: Effort normal. No respiratory distress. She exhibits tenderness.  Reproducible chest tenderness to palpation  Abdominal: Soft. She exhibits no distension. There is no tenderness. There is no rebound and no guarding.  Musculoskeletal: She exhibits no edema and no tenderness.  Neurological: She is alert and oriented to person, place, and time.  Skin: Skin is warm and dry.    ED Course  Procedures (including critical care time)  Labs Reviewed  CBC WITH DIFFERENTIAL  BASIC METABOLIC PANEL  TROPONIN I  URINALYSIS, ROUTINE W REFLEX MICROSCOPIC  PREGNANCY, URINE  D-DIMER, QUANTITATIVE  PRO B NATRIURETIC PEPTIDE   No results found.   No diagnosis found.    MDM   Date: 04/15/2013  Rate: 98  Rhythm: normal sinus rhythm  QRS Axis: normal  Intervals: normal  ST/T Wave abnormalities: normal  Conduction Disutrbances: none  Narrative Interpretation:  unremarkable  Differential diagnosis includes: ACS syndrome CHF exacerbation Valvular disorder Myocarditis Pericarditis Pericardial effusion Pneumonia Pleural effusion Pulmonary edema PE Anemia Musculoskeletal pain  Pt comes in with cc of chest pain. She is only 31, but has iddm, htn and hx of hodgkins now in remission with some cardiomegaly. Concerns for ACS syndrome still present.' Pain is atypical, and is reproducible however. Will get serial trops and ekg. Also possibility for PE based on pleuritic chest pain and dib and some tachypnea. Wells score is 0, so dimer ordered - however, if not better, and  pain persists - then we will get a CT PE, mainly because PE will become more likely dx and then the wells score will be at 3 or moderate risk.         Derwood Kaplan, MD 04/17/13 [id number]

## 2013-04-15 NOTE — ED Notes (Signed)
Patient transported to CT 

## 2013-04-17 ENCOUNTER — Telehealth: Payer: Self-pay | Admitting: *Deleted

## 2013-04-17 ENCOUNTER — Encounter: Payer: Self-pay | Admitting: Family Medicine

## 2013-04-17 ENCOUNTER — Ambulatory Visit (INDEPENDENT_AMBULATORY_CARE_PROVIDER_SITE_OTHER): Payer: Medicaid Other | Admitting: Family Medicine

## 2013-04-17 VITALS — BP 135/88 | HR 94 | Temp 101.9°F | Ht 67.0 in | Wt 217.0 lb

## 2013-04-17 DIAGNOSIS — J189 Pneumonia, unspecified organism: Secondary | ICD-10-CM

## 2013-04-17 MED ORDER — BENZONATATE 100 MG PO CAPS: 100.0000 mg | ORAL_CAPSULE | Freq: Two times a day (BID) | ORAL | Status: DC | PRN

## 2013-04-17 MED ORDER — LEVOFLOXACIN 500 MG PO TABS: 500.0000 mg | ORAL_TABLET | Freq: Every day | ORAL | Status: DC

## 2013-04-17 NOTE — Assessment & Plan Note (Signed)
A: 32 yo F with history of Hodgkin's lymphoma and PNA. Being treated for PNA. Persistent fever, productive cough and pain. No hypoxia.  P: Step up to levaquin for 7-10 days.  Finish azithromycin, normal QTc.  Tessalon for cough  F/u CXR Wrote letter for patient to be out of work until f/u appt.

## 2013-04-17 NOTE — Telephone Encounter (Signed)
Spoke with patient.  Let her know that her MUGA scan showed normal heart function.  She appreciated the call.

## 2013-04-17 NOTE — Patient Instructions (Addendum)
Davonna,  Thank you for coming in today. Please finish azithromycin Start levaquin.  For cough: corcidin HBP or tessolon perles For fever: tylenol Go for repeat x-ray.   F/u on Monday.   Dr. Armen Pickup

## 2013-04-17 NOTE — Telephone Encounter (Signed)
Message copied by Orbie Hurst on Wed Apr 17, 2013 12:40 PM ------      Message from: Levert Feinstein      Created: Sun Apr 14, 2013  1:54 PM       Call pt  Normal heart function on MUGA scan ------

## 2013-04-17 NOTE — Progress Notes (Signed)
Subjective:     Patient ID: [name], female   DOB: [birth year], 32 y.o.   MRN: [id number]  HPI 32 yo F with history Hodgkin lymphoma, NIDDM II, HTN presents for pneumonia follow up. She was seen in the ED 3 days ago for L sided pleuritic chest pain. After a negative CT chest, EKG and CXR that revealed L sided PNA she was started on azithromycin. She reports persistent CP, productive cough, SOB, fatigue and fever. T max 103 yesterday. She has been out of work for the week.   Review of Systems As per HPI     Objective:   Physical Exam BP 135/88  Pulse 94  Temp(Src) 101.9 F (38.8 C) (Oral)  Ht 5\' 7"  (1.702 m)  Wt 217 lb (98.431 kg)  BMI 33.98 kg/m2  SpO2 97%  LMP 04/11/2013 General appearance: alert and cooperative, no distress, ill appearance.  Eyes: conjunctivae/corneas clear. PERRL, EOM's intact. Fundi benign. Ears: normal TM's and external ear canals both ears Nose: Nares normal. Septum midline. Mucosa normal. No drainage or sinus tenderness. Throat: lips, mucosa, and tongue normal; teeth and gums normal Neck: no adenopathy, no carotid bruit, no JVD, supple, symmetrical, trachea midline and thyroid not enlarged, symmetric, no tenderness/mass/nodules Lungs: normal WOB, decreased BS L base. no wheezing or crackles.  Heart: regular rate and rhythm, S1, S2 normal, no murmur, click, rub or gallop     Assessment and Plan:

## 2013-04-18 ENCOUNTER — Ambulatory Visit (HOSPITAL_COMMUNITY)
Admission: RE | Admit: 2013-04-18 | Discharge: 2013-04-18 | Disposition: A | Discharge status: Home / Self Care | Payer: Medicaid Other | Source: Ambulatory Visit | Attending: Family Medicine | Admitting: Family Medicine

## 2013-04-18 DIAGNOSIS — R059 Cough, unspecified: Secondary | ICD-10-CM | POA: Insufficient documentation

## 2013-04-18 DIAGNOSIS — R509 Fever, unspecified: Secondary | ICD-10-CM | POA: Insufficient documentation

## 2013-04-18 DIAGNOSIS — R05 Cough: Secondary | ICD-10-CM | POA: Insufficient documentation

## 2013-04-18 DIAGNOSIS — J189 Pneumonia, unspecified organism: Secondary | ICD-10-CM | POA: Insufficient documentation

## 2013-04-18 DIAGNOSIS — C8190 Hodgkin lymphoma, unspecified, unspecified site: Secondary | ICD-10-CM | POA: Insufficient documentation

## 2013-04-19 ENCOUNTER — Telehealth: Payer: Self-pay | Admitting: Family Medicine

## 2013-04-19 NOTE — Telephone Encounter (Signed)
Called patient back. She indicated that she had an appt with me on Tuesday. I am not in clinic on Tuesday. Patient does not yet have appt to f/u PNA. She needs one. Asked her to call in to schedule it for next Tuesday. She said she would.

## 2013-04-19 NOTE — Telephone Encounter (Signed)
Called patient. X-ray with persistent L sided infiltrate. She has started levaquin.  She is still having cough, pain and fever off and on. T max 102.  Plan: continue levaquin She will see me in f/u on Tuesday.  If no better we may need to hospitalize for further eval and tx with IV antibiotics.  Informed her that she may go to urgent care or ED over the weekend if she get worse.

## 2013-04-25 ENCOUNTER — Encounter: Payer: Self-pay | Admitting: Family Medicine

## 2013-04-25 ENCOUNTER — Ambulatory Visit (INDEPENDENT_AMBULATORY_CARE_PROVIDER_SITE_OTHER): Payer: Medicaid Other | Admitting: Family Medicine

## 2013-04-25 VITALS — BP 156/102 | HR 73 | Temp 98.6°F | Wt 215.0 lb

## 2013-04-25 DIAGNOSIS — J189 Pneumonia, unspecified organism: Secondary | ICD-10-CM

## 2013-04-25 DIAGNOSIS — I10 Essential (primary) hypertension: Secondary | ICD-10-CM

## 2013-04-25 NOTE — Assessment & Plan Note (Signed)
This is improving significantly with antibiotics. Give her history of lymphoma, it was suggested by the radiologist to get a follow up chest X-ray. This will be done in approximately 1 month to give adequate time for infiltration to clear. She was given indications for follow up: fever, worsening SOB, or severe chest pain. Patient in agreement.

## 2013-04-25 NOTE — Assessment & Plan Note (Signed)
Persistently high BP, may be related to OCP. Will be switching to IUD next week, so she was instructed to follow up with her PCP in late June for HTN check.

## 2013-04-25 NOTE — Progress Notes (Signed)
  Subjective:    Patient ID: [name], female    DOB: 09/06/1981, 32 y.o.   MRN: [id number]  HPI  32 year old F with hx of DM type 2 and Hodgkins lymphoma who was evaluated in the ED on 04/15/13 and diagnosed with left middle lobe pneumonia and started on Azithromycin. On 04/17/13, the patient was seen in [facility] clinic by Dr. Armen Pickup and started on Levofloxacin for persistent symptoms of CAP. Today she presents for follow up and notes difficulty with deep breath and pleuritic pain. Pain on left sided of chest exacerbated by leaning back and taking deep breaths. Pain is worse in morning. However, overall, she is much improved compared to last week.   She has completed the course of azithromycin for 7 days. She still has 2 days left Levaquin. Talking tessalon perles for cough.   PMH - Pneumonia in 2013 requiring hospitalization; Lymphoma in remission with negative CT chest on 03/28/13 and plans for port removal in June      Review of Systems Decreased appetite     Objective:   Physical Exam BP 156/102  Pulse 73  Temp(Src) 98.6 F (37 C) (Oral)  Wt 215 lb (97.523 kg)  BMI 33.67 kg/m2  LMP 04/04/2013 Gen: young AAF, non ill appearing, pleasant and conversant Neck: no submadibular or submental lymphadenopathy CV: RRR, no murmurs, rubs or gallops Pulm: talking in complete sentences, normal work of breathing, lungs CTA-B, no wheezes, rhonchi or rales, no dullness to percussion      Assessment & Plan:

## 2013-04-25 NOTE — Patient Instructions (Signed)
[name],   I am very glad that your symptoms are improving after taking the antibiotics, which give good evidence that this was all related to pneumonia and not a recurrence of cancer. Please finish your course of levofloxacin. Also, I will put in an order for follow up chest X-ray to be done in the last week of June. After having the chest X-ray, please follow up with Dr. Gwendolyn Grant about this issue as well as blood pressure.   Please return sooner if the pain or shortness of breath worsens or you develop recurrent fevers.   Take Care,   Dr. Clinton Sawyer

## 2013-05-01 ENCOUNTER — Other Ambulatory Visit: Payer: Self-pay | Admitting: Advanced Practice Midwife

## 2013-05-01 ENCOUNTER — Encounter: Payer: Self-pay | Admitting: Advanced Practice Midwife

## 2013-05-01 ENCOUNTER — Ambulatory Visit (INDEPENDENT_AMBULATORY_CARE_PROVIDER_SITE_OTHER): Payer: Medicaid Other | Admitting: Advanced Practice Midwife

## 2013-05-01 VITALS — BP 153/100 | HR 86 | Ht 67.0 in | Wt 217.7 lb

## 2013-05-01 DIAGNOSIS — Z30430 Encounter for insertion of intrauterine contraceptive device: Secondary | ICD-10-CM

## 2013-05-01 MED ORDER — LEVONORGESTREL 20 MCG/24HR IU IUD
INTRAUTERINE_SYSTEM | Freq: Once | INTRAUTERINE | Status: AC
  Administered 2013-05-01: 14:00:00 via INTRAUTERINE

## 2013-05-01 NOTE — Progress Notes (Signed)
Patient ID: [name], female   DOB: [birth year], 32 y.o.   MRN: [id number] S: [name] is a 32 y.o. [id number] here today for IUD insertion. She has had a Mirena in the past, and it is her preferred method of contraception. All R/B/A have been reviewed and consent obtained O: NAD U preg negative Time out performed  External: no lesion Vagina: norma Cervix: pink, smooth Uterus: AV, NSSC Cervix visualized and cleansed with betadine. Cervix grasped with tenaculum and sounded to 7cm. IUD inserted without difficulty and strings trimmed to 3cm. Pt tolerated procedure well. A: 32 y.o. IUD insertion Post care reviewed FU in 1 month.

## 2013-05-08 ENCOUNTER — Encounter (INDEPENDENT_AMBULATORY_CARE_PROVIDER_SITE_OTHER): Payer: Self-pay | Admitting: General Surgery

## 2013-05-08 ENCOUNTER — Ambulatory Visit (INDEPENDENT_AMBULATORY_CARE_PROVIDER_SITE_OTHER): Payer: Medicaid Other | Admitting: General Surgery

## 2013-05-08 VITALS — BP 142/88 | HR 78 | Temp 98.2°F | Resp 16 | Ht 67.5 in | Wt 215.6 lb

## 2013-05-08 DIAGNOSIS — Z452 Encounter for adjustment and management of vascular access device: Secondary | ICD-10-CM

## 2013-05-08 NOTE — Progress Notes (Signed)
Patient ID: [name], female   DOB: 1981/01/20, 32 y.o.   MRN: [id number]  Chief Complaint  Patient presents with  . Follow-up    reck PAC and discuss removal    HPI [name] is a 32 y.o. female.   HPI 32 year old [place] American female comes into the office desiring removal of her Port-A-Cath. She has a history of Hodgkin's lymphoma. I did an axillary biopsy on her last winter as well as placing a Port-A-Cath in her right chest last winter. She has recently been seen by the oncology team and has been cleared for Port-A-Cath removal. Unfortunately she developed a left sided pneumonia in mid May. She is still recovering from that. She states that she feels a lot better but still not back to her baseline. Her primary issue with presentation of her pneumonia with pleuritic chest pain and shortness of breath. She really did not have much sputum production. She is currently scheduled to have a repeat chest x-ray imaging to make sure the pneumonia has cleared Past Medical History  Diagnosis Date  . Hypertension   . Complication of anesthesia SLOW TO WAKE UP AFTER C SECTION  . Benign essential HTN 01/19/2012  . Eczema 01/19/2012  . Muscle spasm of back 03/09/2012  . S/P chemotherapy, time since 4-12 weeks 05/11/2012    4 cycles of ABVD with neulasta suppor and zoladex  . Cough, persistent 07/27/2012  . Leukopenia 08/01/2012  . Asthma     seasonal, worse in spring.  Uses inhaler during this time of year  . Migraine   . Hodgkin lymphoma 01/18/2012  . hodgkins lymphoma dx'd 01/16/12    lt axilla and left neck   . Diabetes mellitus   . DM II (diabetes mellitus, type II), controlled 01/19/2012    Past Surgical History  Procedure Laterality Date  . Dilation and curettage of uterus    . Axillary lymph node biopsy  12/2011    left  . Portacath placement  01/25/2012    Procedure: INSERTION PORT-A-CATH;  Surgeon: Atilano Ina, MD,FACS;  Location: [facility];  Service: General;   Laterality: Right;  right subclavian  . Cesarean section  2008    Family History  Problem Relation Age of Onset  . Diabetes Maternal Grandmother   . Heart disease Maternal Grandfather   . Cancer Neg Hx     Social History History  Substance Use Topics  . Smoking status: Former Smoker    Quit date: 11/19/2006  . Smokeless tobacco: Never Used  . Alcohol Use: No    Allergies  Allergen Reactions  . Penicillins Rash    Current Outpatient Prescriptions  Medication Sig Dispense Refill  . amLODipine (NORVASC) 2.5 MG tablet Take 1 tablet (2.5 mg total) by mouth daily.  30 tablet  1  . cyclobenzaprine (FLEXERIL) 10 MG tablet Take 1 tablet (10 mg total) by mouth 3 (three) times daily as needed for muscle spasms.  30 tablet  1  . hydrocortisone cream 1 % Apply 1 application topically 2 (two) times daily as needed. For eczema      . lisinopril-hydrochlorothiazide (PRINZIDE,ZESTORETIC) 20-25 MG per tablet Take 1 tablet by mouth daily.  90 tablet  2  . metFORMIN (GLUCOPHAGE) 500 MG tablet Take 500 mg by mouth 2 (two) times daily with a meal.        . traMADol (ULTRAM) 50 MG tablet Take 1 tablet (50 mg total) by mouth every 8 (eight) hours as needed for pain.  30 tablet  1  . zolpidem (AMBIEN) 5 MG tablet Take 5-10 mg by mouth at bedtime as needed. For sleep.       No current facility-administered medications for this visit.    Review of Systems Review of Systems  Constitutional: Positive for chills. Negative for fever, activity change, appetite change and unexpected weight change.  HENT: Positive for neck pain. Negative for hearing loss.   Eyes: Negative for pain and visual disturbance.  Respiratory: Positive for cough, chest tightness and shortness of breath. Negative for wheezing and stridor.   Cardiovascular: Negative for chest pain, palpitations and leg swelling.  Gastrointestinal: Negative for abdominal pain, diarrhea and constipation.  Genitourinary: Negative for difficulty  urinating.  Musculoskeletal: Negative for back pain.  Neurological: Negative for dizziness, tremors, syncope and light-headedness.  Hematological: Negative for adenopathy. Does not bruise/bleed easily.  Psychiatric/Behavioral: Negative for behavioral problems. The patient is not nervous/anxious.     Blood pressure 142/88, pulse 78, temperature 98.2 F (36.8 C), temperature source Temporal, resp. rate 16, height 5' 7.5" (1.715 m), weight 215 lb 9.6 oz (97.796 kg), last menstrual period 04/04/2013.  Physical Exam Physical Exam  Vitals reviewed. Constitutional: She is oriented to person, place, and time. She appears well-developed and well-nourished. No distress.  Morbidly obese  HENT:  Head: Normocephalic and atraumatic.  Eyes: Conjunctivae are normal. No scleral icterus.  Neck: Normal range of motion. Neck supple. No tracheal deviation present. No thyromegaly present.  Cardiovascular: Normal rate, regular rhythm and normal heart sounds.   Pulmonary/Chest: Effort normal and breath sounds normal. No respiratory distress. She has no wheezes.    Right chest wall Port-A-Cath-no signs of infection  Abdominal: Soft.  Musculoskeletal: She exhibits no edema and no tenderness.  Lymphadenopathy:    She has no cervical adenopathy.  Neurological: She is alert and oriented to person, place, and time.  Skin: Skin is warm and dry. She is not diaphoretic.  Psychiatric: She has a normal mood and affect. Her behavior is normal. Judgment and thought content normal.    Data Reviewed Oncology nurse note PCP note CXR & CT chest reports  Assessment    H/o hodgkins lymphoma Undesired port-a-cath Pneumonia    Plan    Since the patient is still recovering from pneumonia, I advised her we should wait until that has cleared up before removing her Port-A-Cath. Therefore we will schedule her to have her port excised sometime in July. We discussed the risk and benefits of surgery including but not  limited to bleeding, infection, injury to surrounding structures, blood clot formation, scarring, as well as the typical recovery course. We discussed doing the procedure under straight local versus monitored anesthesia care. In the interim the patient is going to think about which method she prefers. We'll finalize anesthesia plan the morning of the procedure. All of her questions were asked and answered.   Mary Sella. Andrey Campanile, MD, FACS General, Bariatric, & Minimally Invasive Surgery [facility], [place]        [facility] M 05/08/2013, 11:10 AM

## 2013-05-08 NOTE — Patient Instructions (Signed)
We will wait to do your surgery until had a repeat chest x-ray to make sure your pneumonia has been cleared

## 2013-05-21 ENCOUNTER — Telehealth: Payer: Self-pay | Admitting: Family Medicine

## 2013-05-21 DIAGNOSIS — Z8701 Personal history of pneumonia (recurrent): Secondary | ICD-10-CM

## 2013-05-21 NOTE — Telephone Encounter (Signed)
Will fwd to MD.  Virgilene Stryker L, CMA  

## 2013-05-21 NOTE — Telephone Encounter (Signed)
I see an open order for chest xray under "Imaging" tab in chart review.  Placed by Dr. Clinton Sawyer.  Patient doesn't need an appt, just go to Imaging Center or [facility] and have this done.  Results will go to Dr. Clinton Sawyer.

## 2013-05-21 NOTE — Telephone Encounter (Signed)
Pt called about referral for chest xray. At her last visit dr [name] indicated she needed a chest xray for the last week of June. No referrals seen  Please advise

## 2013-05-22 NOTE — Telephone Encounter (Signed)
Pt notified she can walk in to [facility] or [facility] and have chest xray done there.  Tabrina Esty, Darlyne Russian, CMA

## 2013-05-23 ENCOUNTER — Ambulatory Visit (HOSPITAL_COMMUNITY)
Admission: RE | Admit: 2013-05-23 | Discharge: 2013-05-23 | Disposition: A | Discharge status: Home / Self Care | Payer: Medicaid Other | Source: Ambulatory Visit | Attending: Family Medicine | Admitting: Family Medicine

## 2013-05-23 DIAGNOSIS — J189 Pneumonia, unspecified organism: Secondary | ICD-10-CM | POA: Insufficient documentation

## 2013-05-23 DIAGNOSIS — Z87898 Personal history of other specified conditions: Secondary | ICD-10-CM | POA: Insufficient documentation

## 2013-05-24 ENCOUNTER — Telehealth: Payer: Self-pay | Admitting: Family Medicine

## 2013-05-24 DIAGNOSIS — R918 Other nonspecific abnormal finding of lung field: Secondary | ICD-10-CM

## 2013-05-24 NOTE — Telephone Encounter (Signed)
Patient's oncologist called to talk about her case. Dr. Cyndie Chime recommends a PET-CT scan since the CT already demonstrated infiltrated in May, it would be most helpful to know if this is a metabolically active mass. I will initiate the process of getting the PET-CT ordered.

## 2013-05-24 NOTE — Telephone Encounter (Signed)
Lupita Leash can you help Dr. Clinton Sawyer out with this - Thanks!! Lanora Manis

## 2013-05-24 NOTE — Telephone Encounter (Signed)
Left message for patient to call me regarding chest X-ray.

## 2013-05-24 NOTE — Telephone Encounter (Signed)
I spoke with the patient and she stated that she feels fine except for allergies. She denies chest pain, shortness of breath, fever, chills, and weight loss. She understands that her chest X-ray still shows an infiltrate and that follow up imaging is recommended. She is agreeable to PET scan.

## 2013-05-24 NOTE — Telephone Encounter (Signed)
I called the Cancer Center to speak with Dr. Cyndie Chime about the proper imaging study to order given new infiltrate. Message was left with his nurse who will ask him to page me. I appreciate their guidance in this matter.

## 2013-06-03 ENCOUNTER — Encounter: Payer: Self-pay | Admitting: *Deleted

## 2013-06-04 ENCOUNTER — Telehealth: Payer: Self-pay | Admitting: Family Medicine

## 2013-06-04 ENCOUNTER — Ambulatory Visit (HOSPITAL_COMMUNITY)
Admission: RE | Admit: 2013-06-04 | Discharge: 2013-06-04 | Disposition: A | Discharge status: Home / Self Care | Payer: Medicaid Other | Source: Ambulatory Visit | Attending: Family Medicine | Admitting: Family Medicine

## 2013-06-04 DIAGNOSIS — Z9221 Personal history of antineoplastic chemotherapy: Secondary | ICD-10-CM | POA: Insufficient documentation

## 2013-06-04 DIAGNOSIS — E119 Type 2 diabetes mellitus without complications: Secondary | ICD-10-CM | POA: Insufficient documentation

## 2013-06-04 DIAGNOSIS — C8190 Hodgkin lymphoma, unspecified, unspecified site: Secondary | ICD-10-CM | POA: Insufficient documentation

## 2013-06-04 DIAGNOSIS — R918 Other nonspecific abnormal finding of lung field: Secondary | ICD-10-CM

## 2013-06-04 MED ORDER — FLUDEOXYGLUCOSE F - 18 (FDG) INJECTION
18.4000 | Freq: Once | INTRAVENOUS | Status: AC | PRN
  Administered 2013-06-04: 18.4 via INTRAVENOUS

## 2013-06-04 NOTE — Telephone Encounter (Signed)
Called to let the patient know that her results of the PET scan did not show any areas in the lung concerning for cancer.

## 2013-06-05 NOTE — Telephone Encounter (Signed)
Patient's mobile number was tried again without answer. I left a message informing her the the PET scan showed clearing of the infiltrate in her left lingula and that no areas concerning for cancer were noted. I also noted that I will forward a copy of this note to Dr. Cyndie Chime, her oncologist.

## 2013-06-27 ENCOUNTER — Telehealth (INDEPENDENT_AMBULATORY_CARE_PROVIDER_SITE_OTHER): Payer: Self-pay | Admitting: *Deleted

## 2013-06-27 NOTE — Telephone Encounter (Signed)
Patient called to state that her PAC removal had to be postponed due to a dx of PNA.  Patient states her CXR's are now clear so she wishes to proceed with PAC removal.  Explained to patient that a message will be sent to Dr. Andrey Campanile to find out if anything needs to be done prior to orders being placed and/or to get orders placed.  Patient states understanding at this time and agreeable with plan.

## 2013-06-27 NOTE — Telephone Encounter (Signed)
Ok. Will fill out posting sheet for [facility] during Friday's office. Lori please have posting sheet for [facility] available.

## 2013-07-03 ENCOUNTER — Telehealth (INDEPENDENT_AMBULATORY_CARE_PROVIDER_SITE_OTHER): Payer: Self-pay | Admitting: General Surgery

## 2013-07-03 NOTE — Telephone Encounter (Signed)
Called patient [phone]) on 8.1.14, 8.4.14, and 8.6.14  and left a voicemail each time to schedule patient's surgical procedure, I received no response or a phone back from patient schedule her surgery.  Orders will be placed in pending folder until patient calls to schedule.

## 2013-07-11 ENCOUNTER — Telehealth: Payer: Self-pay | Admitting: *Deleted

## 2013-07-11 NOTE — Telephone Encounter (Signed)
Letter sent regarding diabetes follow up care Elizabeth Saja Bartolini, RN-BSN   

## 2013-08-06 ENCOUNTER — Ambulatory Visit (HOSPITAL_COMMUNITY)
Admission: RE | Admit: 2013-08-06 | Discharge: 2013-08-06 | Disposition: A | Discharge status: Home / Self Care | Payer: Medicaid Other | Source: Ambulatory Visit | Attending: Nurse Practitioner | Admitting: Nurse Practitioner

## 2013-08-06 ENCOUNTER — Encounter (HOSPITAL_COMMUNITY): Payer: Self-pay

## 2013-08-06 ENCOUNTER — Other Ambulatory Visit (HOSPITAL_BASED_OUTPATIENT_CLINIC_OR_DEPARTMENT_OTHER): Payer: Self-pay | Admitting: Lab

## 2013-08-06 DIAGNOSIS — R091 Pleurisy: Secondary | ICD-10-CM | POA: Insufficient documentation

## 2013-08-06 DIAGNOSIS — Y842 Radiological procedure and radiotherapy as the cause of abnormal reaction of the patient, or of later complication, without mention of misadventure at the time of the procedure: Secondary | ICD-10-CM | POA: Insufficient documentation

## 2013-08-06 DIAGNOSIS — J701 Chronic and other pulmonary manifestations due to radiation: Secondary | ICD-10-CM | POA: Insufficient documentation

## 2013-08-06 DIAGNOSIS — C8118 Nodular sclerosis classical Hodgkin lymphoma, lymph nodes of multiple sites: Secondary | ICD-10-CM

## 2013-08-06 DIAGNOSIS — C8190 Hodgkin lymphoma, unspecified, unspecified site: Secondary | ICD-10-CM

## 2013-08-06 DIAGNOSIS — T66XXXS Radiation sickness, unspecified, sequela: Secondary | ICD-10-CM | POA: Insufficient documentation

## 2013-08-06 LAB — CBC WITH DIFFERENTIAL/PLATELET
Basophils Absolute: 0 10*3/uL (ref 0.0–0.1)
EOS%: 6.5 % (ref 0.0–7.0)
HGB: 13.3 g/dL (ref 11.6–15.9)
MCH: 32.1 pg (ref 25.1–34.0)
NEUT#: 1.8 10*3/uL (ref 1.5–6.5)
RDW: 13.1 % (ref 11.2–14.5)
WBC: 3.7 10*3/uL — ABNORMAL LOW (ref 3.9–10.3)
lymph#: 1.1 10*3/uL (ref 0.9–3.3)

## 2013-08-06 LAB — COMPREHENSIVE METABOLIC PANEL (CC13)
ALT: 20 U/L (ref 0–55)
Alkaline Phosphatase: 60 U/L (ref 40–150)
CO2: 26 mEq/L (ref 22–29)
Sodium: 139 mEq/L (ref 136–145)
Total Bilirubin: 0.57 mg/dL (ref 0.20–1.20)
Total Protein: 7.1 g/dL (ref 6.4–8.3)

## 2013-08-06 LAB — LACTATE DEHYDROGENASE (CC13): LDH: 173 U/L (ref 125–245)

## 2013-08-06 MED ORDER — IOHEXOL 300 MG/ML  SOLN
50.0000 mL | Freq: Once | INTRAMUSCULAR | Status: AC | PRN
  Administered 2013-08-06: 50 mL via ORAL

## 2013-08-06 MED ORDER — IOHEXOL 300 MG/ML  SOLN
125.0000 mL | Freq: Once | INTRAMUSCULAR | Status: AC | PRN
  Administered 2013-08-06: 125 mL via INTRAVENOUS

## 2013-08-08 ENCOUNTER — Telehealth: Payer: Self-pay | Admitting: *Deleted

## 2013-08-08 NOTE — Telephone Encounter (Signed)
Spoke with patient.  Let her know that CT shows continued remission of her Hodgkins.  She appreciated the phone call.

## 2013-08-08 NOTE — Telephone Encounter (Signed)
Message copied by Orbie Hurst on Thu Aug 08, 2013  4:29 PM ------      Message from: Levert Feinstein      Created: Tue Aug 06, 2013  9:00 PM       Call pt CT shows continued remission of Hodgkin's ------

## 2013-08-13 ENCOUNTER — Ambulatory Visit (HOSPITAL_BASED_OUTPATIENT_CLINIC_OR_DEPARTMENT_OTHER): Payer: Self-pay | Admitting: Oncology

## 2013-08-13 ENCOUNTER — Telehealth: Payer: Self-pay | Admitting: Oncology

## 2013-08-13 VITALS — BP 175/108 | HR 72 | Temp 98.1°F | Resp 20 | Ht 67.5 in | Wt 226.1 lb

## 2013-08-13 DIAGNOSIS — C8190 Hodgkin lymphoma, unspecified, unspecified site: Secondary | ICD-10-CM

## 2013-08-13 DIAGNOSIS — I10 Essential (primary) hypertension: Secondary | ICD-10-CM

## 2013-08-13 MED ORDER — METOPROLOL SUCCINATE ER 50 MG PO TB24: 50.0000 mg | ORAL_TABLET | Freq: Every day | ORAL | Status: DC

## 2013-08-13 NOTE — Telephone Encounter (Signed)
Gave pt appt for lab and ML for March 2015

## 2013-08-15 NOTE — Progress Notes (Signed)
Hematology and Oncology Follow Up Visit  [name] [id number] [birth year] 32 y.o. 08/15/2013 4:25 PM   Principle Diagnosis: Encounter Diagnoses  Name Primary?  . HTN (hypertension), benign   . Hodgkin's lymphoma Yes     Interim History:   Followup visit for this pleasant 32 year old woman with stage IIB nodular sclerosing Hodgkin's lymphoma diagnosed in February of 2013 with biopsy of a left axillary lymph node mass. Staging evaluation showed no disease below the diaphragm. She received 4 cycles of ABVD chemotherapy between March 8 and 05/12/2012 and then involved field radiation 36 gray in 25 fractions between July 22 and 07/16/2012. She achieved a complete remission. Please see my treatment summary note dated 08/17/2012 for additional details.  At the tail end of completion of her radiation treatment she developed bilateral interstitial pneumonia and was hospitalized on 07/25/2012. I treated her empirically for pneumocystis. She improved rapidly and avoided lung biopsy.  She still has a Port-A-Cath infusion device in place. She states that her insurance company will not authorize removal her insurance coverage to have it removed!  She is back working 2 jobs and raising her child. She reports no constitutional symptoms except for work related fatigue. Appetite is good. No fevers, no night sweats, no weight loss.   Medications: reviewed  Allergies:  Allergies  Allergen Reactions  . Penicillins Rash    Review of Systems: Constitutional:   No constitutional symptoms HEENT no sore throat Respiratory: No cough or dyspnea Cardiovascular:  No chest pain or palpitations Gastrointestinal: No abdominal pain or change in bowel habit Genito-Urinary: No urinary tract symptoms Musculoskeletal: No muscle bone or joint pain Neurologic: No headache or change in vision Skin: No rash or ecchymosis  Remaining ROS negative.     Physical Exam: Blood pressure 175/108, pulse 72,  temperature 98.1 F (36.7 C), temperature source Oral, resp. rate 20, height 5' 7.5" (1.715 m), weight 226 lb 1.6 oz (102.558 kg). Wt Readings from Last 3 Encounters:  08/13/13 226 lb 1.6 oz (102.558 kg)  05/08/13 215 lb 9.6 oz (97.796 kg)  05/01/13 217 lb 11.2 oz (98.748 kg)     General appearance: Well-nourished African American woman HENNT: Pharynx no erythema, exudate, or mass, no thyromegaly or thyroid nodules Lymph nodes: No cervical, supraclavicular, or axillary adenopathy Breasts:  Lungs: Clear to auscultation, resonant to percussion Heart: Regular rhythm, no murmur gallop or rub Abdomen: Soft, nontender, no mass, no organomegaly Extremities: No edema, no calf tenderness Musculoskeletal: No joint deformities GU: Vascular: No carotid bruits, no cyanosis Neurologic: Mental status exam, cranial nerves grossly normal, motor strength 5 over 5, reflexes absent symmetric at the knees, 1+ symmetric at the biceps Skin: No rash or ecchymosis  Lab Results: Lab Results  Component Value Date   WBC 3.7* 08/06/2013   HGB 13.3 08/06/2013   HCT 38.1 08/06/2013   MCV 92.1 08/06/2013   PLT 182 08/06/2013     Chemistry      Component Value Date/Time   NA 139 08/06/2013 0823   NA 133* 04/15/2013 1230   NA 139 05/28/2012 0818   K 4.1 08/06/2013 0823   K 4.1 04/15/2013 1230   K 4.6 05/28/2012 0818   CL 98 04/15/2013 1230   CL 106 03/28/2013 0954   CL 95* 05/28/2012 0818   CO2 26 08/06/2013 0823   CO2 21 04/15/2013 1230   CO2 28 05/28/2012 0818   BUN 11.1 08/06/2013 0823   BUN 9 04/15/2013 1230   BUN 11 05/28/2012 0818  CREATININE 0.8 08/06/2013 0823   CREATININE 0.77 04/15/2013 1230   CREATININE 0.80 02/07/2013 0918   CREATININE 0.67 10/08/2007 1250      Component Value Date/Time   CALCIUM 8.8 08/06/2013 0823   CALCIUM 9.0 04/15/2013 1230   CALCIUM 9.3 05/28/2012 0818   ALKPHOS 60 08/06/2013 0823   ALKPHOS 75 02/07/2013 0918   ALKPHOS 71 05/28/2012 0818   AST 14 08/06/2013 0823   AST 15 02/07/2013 0918   AST 24  05/28/2012 0818   ALT 20 08/06/2013 0823   ALT 15 02/07/2013 0918   ALT 25 05/28/2012 0818   BILITOT 0.57 08/06/2013 0823   BILITOT 0.8 02/07/2013 0918   BILITOT 0.60 05/28/2012 0818       Radiological Studies: Ct Soft Tissue Neck W Contrast  08/06/2013   CLINICAL DATA:  32 year old female with Hodgkin's lymphoma. Chemotherapy and radiation completed.  EXAM: CT NECK WITH CONTRAST  TECHNIQUE: Multidetector CT imaging of the neck was performed using the standard protocol following the bolus administration of intravenous contrast.  CONTRAST:  50mL OMNIPAQUE IOHEXOL 300 MG/ML SOLN, OMNIPAQUE IOHEXOL 300 MG/ML SOLN  In conjunction with CTs of the chest, and abdomen reported separately.  COMPARISON:  PET-CT 06/04/2013 and earlier.  FINDINGS: Chest findings reported separately.  Decreased level 4/thoracic inlet lymph nodes which or more abnormal on the left side. Left level 4 node lateral to the left IJ at the level of the thyroid measures 8 mm short axis now , 11 mm previously.  Visible left subclavian nodes appear more similar to the comparison, measuring up to 11 mm short axis (series 3, image 102).  Stable small left greater than right level IIIb nodes measuring up to 7 mm short axis. No level 1 lymphadenopathy. No level V lymphadenopathy. Level 2 nodes are stable and within normal limits measuring up to 8 mm short axis bilaterally.  No new or increased lymph nodes identified.  Negative thyroid, larynx, pharynx, parapharyngeal spaces, retropharyngeal space, sublingual space, submandibular glands, and parotid glands (small 6 mm area of mild parotid hyper enhancement demonstrated on image 40 to, significance doubtful). Visualized orbit soft tissues are within normal limits. Grossly negative visualized brain parenchyma. Major vascular structures in the neck and at the skullbase are patent.  No osseous abnormality identified.  IMPRESSION: 1. Stable to mildly decreased asymmetric and mildly enlarged lymph nodes at the  left lower neck and thoracic inlet, maximal on the left at level 4 and at the subclavian nodal station.  2. No new abnormality identified in the neck.   Electronically Signed   By: Augusto Gamble M.D.   On: 08/06/2013 13:49   Ct Chest W Contrast  08/06/2013   *RADIOLOGY REPORT*  Clinical Data:  Hodgkin's lymphoma.  CT CHEST, ABDOMEN  WITH CONTRAST  Technique: Contiguous axial images of the chest abdomen  were obtained after IV contrast administration.  Contrast: 125 ml Omnipaque-300  Comparison: PET 06/04/2013.  Chest CT 04/15/2013.  Most recent diagnostic abdominal pelvic CT 01/24/2012.  CT CHEST  Findings: Lung windows demonstrate minimal nodularity at the right lung base on image 43/series 6. Probable scarring in the superior segment right lower lobe on image 23/series 6.  Unchanged.  Left peri mediastinal upper lobe radiation fibrosis.  Similar since 06/04/2013.  Marland Kitchen  Soft tissue windows demonstrate a left cervical node which measures 1.0 cm on image 3/series 2.  1.1 cm on the prior PET.  Similar small left axillary nodes.  Mild cardiomegaly, without pericardial or pleural effusion.  There is mild left sided pleural thickening adjacent the apex.  This is unchanged. No mediastinal or hilar adenopathy  IMPRESSION:  1.  No evidence of recurrent or residual lymphoma within the chest. 2.  Similar indeterminate low left cervical nodes.  Favored to be reactive. 3.  Radiation changes within the medial left upper lobe.  CT ABDOMEN  Findings:  Normal liver, spleen, stomach, pancreas, gallbladder, biliary tract, adrenal glands, kidneys.  No retroperitoneal or retrocrural adenopathy.  Normal abdominal portions of the large and small bowel loops, without ascites or mesenteric adenopathy.  Pelvis not imaged. No acute osseous abnormality.  IMPRESSION:  No acute process or evidence of active lymphoma within the abdomen.   Original Report Authenticated By: Jeronimo Greaves, M.D.   Ct Abdomen W Contrast  08/06/2013   *RADIOLOGY REPORT*   Clinical Data:  Hodgkin's lymphoma.  CT CHEST, ABDOMEN  WITH CONTRAST  Technique: Contiguous axial images of the chest abdomen  were obtained after IV contrast administration.  Contrast: 125 ml Omnipaque-300  Comparison: PET 06/04/2013.  Chest CT 04/15/2013.  Most recent diagnostic abdominal pelvic CT 01/24/2012.  CT CHEST  Findings: Lung windows demonstrate minimal nodularity at the right lung base on image 43/series 6. Probable scarring in the superior segment right lower lobe on image 23/series 6.  Unchanged.  Left peri mediastinal upper lobe radiation fibrosis.  Similar since 06/04/2013.  Marland Kitchen  Soft tissue windows demonstrate a left cervical node which measures 1.0 cm on image 3/series 2.  1.1 cm on the prior PET.  Similar small left axillary nodes.  Mild cardiomegaly, without pericardial or pleural effusion.  There is mild left sided pleural thickening adjacent the apex.  This is unchanged. No mediastinal or hilar adenopathy  IMPRESSION:  1.  No evidence of recurrent or residual lymphoma within the chest. 2.  Similar indeterminate low left cervical nodes.  Favored to be reactive. 3.  Radiation changes within the medial left upper lobe.  CT ABDOMEN  Findings:  Normal liver, spleen, stomach, pancreas, gallbladder, biliary tract, adrenal glands, kidneys.  No retroperitoneal or retrocrural adenopathy.  Normal abdominal portions of the large and small bowel loops, without ascites or mesenteric adenopathy.  Pelvis not imaged. No acute osseous abnormality.  IMPRESSION:  No acute process or evidence of active lymphoma within the abdomen.   Original Report Authenticated By: Jeronimo Greaves, M.D.    Impression:  #1. Stage IIB nodular sclerosing Hodgkin's lymphoma with  treatment as outlined above. She remains in a complete radiographic remission now out 1-1/2 years from diagnosis in February 2013. Plan: Continued every four-month CT scan followups for the first 2 years, every 6 months for a year 3, then annual chest x-ray  and clinical exam subsequent to that unless otherwise determined by new symptoms or physical findings.  #2. History of bilateral interstitial pneumonia August 2013. Response to empiric therapy for pneumocystis.   #3. Mild leukopenia, persistent. Secondary to prior chemotherapy  #4. Hypertension. Poorly controlled. I'm going to add a beta blocker, Toprol-XL 50 mg daily, until she can get in with her primary care physician. She should really see a nephrologist given her young age. Vascular workup appropriate.   #5. Type 2 diabetes     copy: Dr. Payton Mccallum   .    Levert Feinstein, MD 9/18/20144:25 PM

## 2013-09-16 ENCOUNTER — Ambulatory Visit (INDEPENDENT_AMBULATORY_CARE_PROVIDER_SITE_OTHER): Payer: Medicaid Other | Admitting: Family Medicine

## 2013-09-16 VITALS — BP 171/102 | HR 81 | Temp 97.8°F | Ht 67.5 in | Wt 233.0 lb

## 2013-09-16 DIAGNOSIS — I10 Essential (primary) hypertension: Secondary | ICD-10-CM

## 2013-09-16 DIAGNOSIS — R519 Headache, unspecified: Secondary | ICD-10-CM | POA: Insufficient documentation

## 2013-09-16 DIAGNOSIS — R51 Headache: Secondary | ICD-10-CM

## 2013-09-16 DIAGNOSIS — Z23 Encounter for immunization: Secondary | ICD-10-CM

## 2013-09-16 DIAGNOSIS — F411 Generalized anxiety disorder: Secondary | ICD-10-CM

## 2013-09-16 MED ORDER — AMLODIPINE BESYLATE 2.5 MG PO TABS: 10.0000 mg | ORAL_TABLET | Freq: Every day | ORAL | Status: DC

## 2013-09-16 MED ORDER — MORPHINE SULFATE 10 MG/ML IJ SOLN
5.0000 mg | Freq: Once | INTRAMUSCULAR | Status: AC
  Administered 2013-09-16: 5 mg via INTRAMUSCULAR

## 2013-09-16 NOTE — Assessment & Plan Note (Signed)
Assessment: patient with anxiety and symptoms of panic that awaken her from sleep likely due to the stress from her general health situation and financial strains Plan: as temporary relief, I have encouraged the patient to use Benadryl 25 mg each bedtime to aid in sleep also help with her sinus symptoms, however she will likely benefit from further treatment and therapy that we will discuss at upcoming visit

## 2013-09-16 NOTE — Patient Instructions (Addendum)
Dear Elmarie Shiley,   Thank you for coming to clinic today. Please read below regarding the issues that we discussed.   1. Headache - You have characteristics of a migraine, but we can also get headaches from using too much headache medication. We will give you an injection of morphine today. After that you cannot drive. Please stop taking  Aleve, Tylenol and Excedrin. I recommend not taking anything else today. If you have a severe headache tomorrow, then you should take aspirin. He can take up to 3 aspirin at a time every 8 hours. However I don't want this to happen for multiple days. Please followup tomorrow at her headache is worsened or not improved. We may need to get a CT scan in this case. Please followup later in the week if it is better.  2. Blood Pressure - Having high blood pressure might make your headache worse. We are going to increase the amlodipine to 10 mg daily. Additionally you should start taking the medication that Dr. Cyndie Chime prescribed.  3. Sleep and Anxiety - For right now, I recommend taking benadryl 25 mg at night. This will help you fall asleep and also help with your sinus pressure if it is related to allergies.  Please follow up in clinic in 3 days. Please call earlier if you have any questions or concerns.   Sincerely,   Dr. Clinton Sawyer

## 2013-09-16 NOTE — Assessment & Plan Note (Signed)
Assessment: patient with poorly controlled hypertension  Plan: continue lisinopril HCTZ combo, increase amlodipine from 2.5 mg to 10 mg daily, and start metoprolol XL 50 mg daily ( which may also help with her headache prophylaxis); followup in 3-4 days

## 2013-09-16 NOTE — Addendum Note (Signed)
Addended by: Jennette Bill on: 09/16/2013 12:42 PM   Modules accepted: Orders

## 2013-09-16 NOTE — Progress Notes (Signed)
  Subjective:    Patient ID: [name], female    DOB: 14-Aug-1981, 32 y.o.   MRN: [id number]  HPI  32 year old F with HTN, hx of hodgkin lymphoma, uncontrolled HTN and migraine headaches who presents with a severe headache.   1. HEADACHE: Location: left sided, radiates front to back Quality: throbbing, currently 6/10 Duration: 5 days, started last Wednesday Frequency: few times per month Accompanying symptoms: yes on day 1 nausea, on day 1 but improving vomiting, yes photophobia, yes phonophobia, no lacrimation, yes rhinorrhea, yes neurologic symptoms - blurry vision of left eye Treatments Tried in 24 hours -  1 gram naproxen, 800 mg ibuprofen, 2 Excedrin History of headaches: yes, 1-2 times per week  History of imaging: CT head in 2010 Known triggers: none, but thinks possible sinus pressure or stress   2. Hypertension  Home BP monitoring: no  Office BP: BP Readings from Last 3 Encounters:  09/16/13 171/102  08/13/13 175/108  05/08/13 142/88    Prescribed meds: lisinopril-HCTZ 20-25mg , amlodidpine 2.5 mg, metoprolol xl 50 mg  Hypertension ROS:  Taking medications as prescribed:No - NOT TAKING METOPROLOL Chest pain: No Shortness of breath: No Swelling of extremities: No TIA symptoms: Yes - blurred vision of left eye that may be related to her migraine  3. Anxiety and Difficulty Sleeping:  Patient concerned about cardiac problem b/c she notes frequent night time awakenings with a racing heart, also having difficulty falling sleep, stressors include health and finances  Review of Systems See HPI    Objective:   Physical Exam BP 171/102  Pulse 81  Temp(Src) 97.8 F (36.6 C) (Oral)  Ht 5' 7.5" (1.715 m)  Wt 233 lb (105.688 kg)  BMI 35.93 kg/m2 Gen: young AA female, obese, well appearing,  HEENT: NCAT, PERRLA, EOMI, OP clear and moist, no lymphadenopathy, no thyroid tenderness, enlargement, or nodules CV: RRR, no m/r/g Lungs: normal work of breathing,  clear to auscultation bilaterally Extremities: no edema or joint tenderness Psych: normal affect, thought content and speech pattern, non anxious appearing  Neuro Exam:  CRANIAL NERVES: 2 (Optic): visual fields intact to confrontation; funduscopic exam demonstrates no papilledema  3,4,6 (EOM): pupils equal round and reactive; extra ocular movement intact 5 (Trigeminal):  no facial numbness; muscles of mastication are intact 7 (Facial): no facial weakness or asymmetry  8 (Auditory): no deficits 9,10 (Glossopharyngeal/Vagus): uvula is midline; palate elevate symmetrically 11 (Spinal Accessory): normal sternocleidomastoid and trapezius strength 12 (Hypoglossal) tongue is midline; no fasciculations or atrophy        Assessment & Plan:

## 2013-09-16 NOTE — Assessment & Plan Note (Signed)
Assessment: patient with a headache that has migrainous characteristics and with a known history of migraines makes the most likely cause; however given her history of lymphoma we need to have a low threshold for evaluating for CNS lesions, additionally she has very poorly controlled blood pressure, but the blood pressure has been constantly elevated in the headache is new, thus I do not think the hypertension precipitated a severe headache Plan: given the patient's heavy use of NSAIDs in the past 24 hours I do not feel Toradol and given her diabetes I do not want to give her steroids, which are both part of her normal headache cocktail; therefore, we'll use morphine 5 mg IM, and the patient has hesitation home; she will return to clinic if the symptoms worsen in the next 24 hours at which point it is reasonable to get a CT scan of her head

## 2013-09-18 ENCOUNTER — Encounter: Payer: Self-pay | Admitting: Family Medicine

## 2013-09-18 ENCOUNTER — Ambulatory Visit (INDEPENDENT_AMBULATORY_CARE_PROVIDER_SITE_OTHER): Payer: Medicaid Other | Admitting: Family Medicine

## 2013-09-18 VITALS — BP 131/74 | HR 77 | Ht 67.5 in | Wt 229.0 lb

## 2013-09-18 DIAGNOSIS — M62830 Muscle spasm of back: Secondary | ICD-10-CM

## 2013-09-18 DIAGNOSIS — I10 Essential (primary) hypertension: Secondary | ICD-10-CM

## 2013-09-18 DIAGNOSIS — M5380 Other specified dorsopathies, site unspecified: Secondary | ICD-10-CM

## 2013-09-18 DIAGNOSIS — R519 Headache, unspecified: Secondary | ICD-10-CM

## 2013-09-18 DIAGNOSIS — E119 Type 2 diabetes mellitus without complications: Secondary | ICD-10-CM

## 2013-09-18 DIAGNOSIS — R51 Headache: Secondary | ICD-10-CM

## 2013-09-18 LAB — BASIC METABOLIC PANEL
BUN: 14 mg/dL (ref 6–23)
Chloride: 100 mEq/L (ref 96–112)
Potassium: 4.1 mEq/L (ref 3.5–5.3)

## 2013-09-18 LAB — POCT GLYCOSYLATED HEMOGLOBIN (HGB A1C): Hemoglobin A1C: 6

## 2013-09-18 MED ORDER — CYCLOBENZAPRINE HCL 10 MG PO TABS: 10.0000 mg | ORAL_TABLET | Freq: Every evening | ORAL | Status: DC | PRN

## 2013-09-18 MED ORDER — KETOROLAC TROMETHAMINE 60 MG/2ML IM SOLN
60.0000 mg | Freq: Once | INTRAMUSCULAR | Status: AC
  Administered 2013-09-18: 60 mg via INTRAMUSCULAR

## 2013-09-18 MED ORDER — DEXAMETHASONE SODIUM PHOSPHATE 10 MG/ML IJ SOLN
10.0000 mg | Freq: Once | INTRAMUSCULAR | Status: AC
  Administered 2013-09-18: 10 mg via INTRAMUSCULAR

## 2013-09-18 NOTE — Progress Notes (Signed)
  Subjective:    Patient ID: [name], female    DOB: 06-20-1981, 32 y.o.   MRN: [id number]  HPI  32 year old F with HTN, hx of hodgkin lymphoma, uncontrolled HTN and migraine headaches who presents for severe headache.   Severe Headache with a history of migraines: The patient was evaluated 2 days ago for severe headache that was presumed and migraine given her history of migraines and the features of the headache without any focal neurologic deficits on exam. She was given an injection of morphine 5 mg IM. She did not receive Toradol because she had been taking numerous anti-inflammatories with him in 24 hours prior to presenting to clinic. She noted this did not help the pain. The pain subsided some yesterday and is currently 5/10. It is located on the right side of her head. It is not associated with vision loss, facial droop, or any other neurologic symptoms. In the past 24 hours the patient is taking 2 Excedrin migraine tablets, 2 Aleve tablets, 800 mg of ibuprofen, and one 10 mg hydrocodone tablets. She denies ever having been on a migraine prophylaxis medication in the past.   Hypertension - patient with very elevated blood pressures and not taking clonidine 2 mg and metoprolol XL 50 mg daily in addition to her baseline lisinopril and hydrochlorothiazide. She denies any chest pain or shortness of breath.   Review of Systems Intermittent lower back pain with spasms    Objective:   Physical Exam BP 131/74  Pulse 77  Ht 5' 7.5" (1.715 m)  Wt 229 lb (103.874 kg)  BMI 35.32 kg/m2  Gen.: well-appearing, overweight, female Eyes: funduscopic exam limited but did not demonstrate retinal hemorrhages or papilledema Neurologic: cranial nerves II through XII grossly intact Cardiovascular: regular rate rhythm, number culture gallops Extremities: no pedal edema     Assessment & Plan:

## 2013-09-18 NOTE — Patient Instructions (Signed)
Dear [name],   It was nice to see you. I am sorry you're still having a severe headache. Hopefully the shots of steroids anti-inflammatory agent today will be helpful. I will check your kidney function to make sure there are no problems. Also we will get a CT scan of your head to make sure there is nothing abnormal in her brain causing this pain. I seriously doubt that there is, but would just like to be safe given your history of cancer.  He may use the Flexeril as needed for lower back pain and spasms. Please take only at night because it may make you very drowsy.  I will let him know the results of your tests. Please followup in 2 weeks with Dr. Gwendolyn Grant or me to address your diabetes.  Sincerely,   Dr. Clinton Sawyer

## 2013-09-18 NOTE — Assessment & Plan Note (Signed)
Well-controlled on Amlodipine 10 mg, lisinopril 20 mg, hydrochlorothiazide 25 mg, metoprolol XL 50 mg Continue current regimen

## 2013-09-18 NOTE — Assessment & Plan Note (Signed)
Refill cyclobenzaprine 10 mg each bedtime when necessary #30, 0 refills

## 2013-09-18 NOTE — Assessment & Plan Note (Signed)
Assessment: severe headache improving still with migrainous features and no evidence of secondary call the physical exam, nonetheless the patient has a noteworthy medical history for Hodgkin's lymphoma, therefore I am concerned about CNS involvement until proven otherwise Plan:  - For pain relief give injection of Toradol 60 mg IM and Decadron 10 mg IM - CT scan without contrast of head to rule out lesion or mass - Consider prophylaxis medication if no secondary cause identified for migraine headache

## 2013-09-19 ENCOUNTER — Telehealth: Payer: Self-pay | Admitting: *Deleted

## 2013-09-19 NOTE — Telephone Encounter (Signed)
Prior authorization for cyclobenzaprine placed in MD box for completion.

## 2013-09-20 ENCOUNTER — Other Ambulatory Visit: Payer: Self-pay | Admitting: Family Medicine

## 2013-09-20 DIAGNOSIS — I10 Essential (primary) hypertension: Secondary | ICD-10-CM

## 2013-09-20 MED ORDER — AMLODIPINE BESYLATE 10 MG PO TABS: 10.0000 mg | ORAL_TABLET | Freq: Every day | ORAL | Status: DC

## 2013-09-20 NOTE — Telephone Encounter (Signed)
Left message for patient the another prescription for amlodipine 10 mg was sent to the pharmacy. It is preferred by Medicaid. Also, I informed her that her lab results showed no problems with her kidneys.

## 2013-09-26 ENCOUNTER — Ambulatory Visit
Admission: RE | Admit: 2013-09-26 | Discharge: 2013-09-26 | Disposition: A | Discharge status: Home / Self Care | Payer: Medicaid Other | Source: Ambulatory Visit | Attending: Family Medicine | Admitting: Family Medicine

## 2013-09-26 DIAGNOSIS — R519 Headache, unspecified: Secondary | ICD-10-CM

## 2013-09-27 ENCOUNTER — Telehealth: Payer: Self-pay | Admitting: Family Medicine

## 2013-09-27 NOTE — Telephone Encounter (Signed)
I left a message for [name] stating that her CT scan was completely normal and for her to call me with any questions or concerns.

## 2013-10-03 ENCOUNTER — Ambulatory Visit: Payer: Medicaid Other | Admitting: Family Medicine

## 2013-10-09 ENCOUNTER — Ambulatory Visit (INDEPENDENT_AMBULATORY_CARE_PROVIDER_SITE_OTHER): Payer: Medicaid Other | Admitting: Family Medicine

## 2013-10-09 ENCOUNTER — Encounter: Payer: Self-pay | Admitting: Family Medicine

## 2013-10-09 VITALS — BP 154/105 | HR 57 | Temp 98.1°F | Wt 231.0 lb

## 2013-10-09 DIAGNOSIS — G479 Sleep disorder, unspecified: Secondary | ICD-10-CM

## 2013-10-09 DIAGNOSIS — R51 Headache: Secondary | ICD-10-CM

## 2013-10-09 DIAGNOSIS — I10 Essential (primary) hypertension: Secondary | ICD-10-CM

## 2013-10-09 MED ORDER — TRAZODONE HCL 50 MG PO TABS: 25.0000 mg | ORAL_TABLET | Freq: Every evening | ORAL | Status: DC | PRN

## 2013-10-09 NOTE — Progress Notes (Signed)
  Subjective:    Patient ID: [name], female    DOB: 1981-07-24, 32 y.o.   MRN: [id number]  HPI  32 year old F with a history of primary headache who presents for follow up.   Headache:  Hx of migraines Recent CT scan of head WNL Headache started 2 days ago, located  Medications tried alleve and excedrin migraine Thinks it is exacerbated by poor sleeping  Sleeping Difficulty:  Sleeping no more than 2 hrs at night Wakes and cannot go back Gets of works after midnight and has to wake 6PM Hx of sleeping difficulty while taking chemotherapy for hodgkin lymphoma  Trying ambien 5 mg and flexeril for back pain intermittently without improvement Denies caffeine use   Hypertension:  Home BP monitoring: no  Office BP: BP Readings from Last 3 Encounters:  10/09/13 154/105  09/18/13 131/74  09/16/13 171/102    Prescribed meds: Amlodipine 10 mg, lisinopril 20 mg, hydrochlorothiazide 25 mg, metoprolol XL 50 mg   Hypertension ROS:  Taking medications as prescribed:Yes Chest pain: No Shortness of breath: No Swelling of extremities: No TIA symptoms: No Regular Exercise: no Low Na+ Diet: no  Review of Systems Sleep difficulty, lower back pain    Objective:   Physical Exam BP 154/105  Pulse 57  Temp(Src) 98.1 F (36.7 C) (Oral)  Wt 231 lb (104.781 kg)  Gen: adult female, well appearing but fatigued, pleasant and conversant Head: no palpable tenderness Eyes: funduscopic exam limited but did not demonstrate retinal hemorrhages or papilledema  Neurologic: cranial nerves II through XII grossly intact  Cardiovascular: regular rate rhythm, number culture gallops      Assessment & Plan:

## 2013-10-09 NOTE — Patient Instructions (Signed)
I am sorry that you are having such a severe headache. You are right that poor sleep can worsen the headaches. I recommend started the Trazodone 25-50 mg as needed for sleep. Try it on a weekend when you have more time to sleep.   Please come back in 2 weeks for another check of your headaches, sleep and blood pressure.   Also, Sharol Harness looks great today and hope that she has a wonderful birthday!  Sincerely,   Dr. Clinton Sawyer

## 2013-10-11 DIAGNOSIS — G479 Sleep disorder, unspecified: Secondary | ICD-10-CM | POA: Insufficient documentation

## 2013-10-11 NOTE — Assessment & Plan Note (Signed)
Assessment: early awakenings with difficulty returning to sleep compounded by very prolonged work schedule which allows only 5-6 hours of sleep Plan: try trazodone 25-50 mg each bedtime when has 8 hours to sleep; f/u in 2 weeks

## 2013-10-11 NOTE — Assessment & Plan Note (Signed)
Assessment: headache with migrainous features but then on the day recurrent daily headaches or medication overuse headache Plan:  - patient declined headache cocktail and office today because she did not want to be fatigued - We'll try to improve sleep this reduces her need for medications and dose decrease her headache frequency

## 2013-10-11 NOTE — Assessment & Plan Note (Signed)
Assessment: very difficult to control blood pressure in the setting of pain, interestingly it was very well controlled a few weeks ago exact same medications, given very prolonged history of high blood pressure in good control 2 weeks ago and lack of hypertension associated symptoms, no urgency to reduce blood pressure in office today Plan:  - Continue metoprolol XL 50 mg, lisinopril HCTZ 20-25, amlodipine 10 mg - recheck in 2 weeks, consider spironolactone, consider increasing her Toprol dose as well

## 2013-10-17 ENCOUNTER — Encounter: Payer: Self-pay | Admitting: Internal Medicine

## 2013-10-17 ENCOUNTER — Ambulatory Visit (INDEPENDENT_AMBULATORY_CARE_PROVIDER_SITE_OTHER): Payer: Medicaid Other | Admitting: Internal Medicine

## 2013-10-17 VITALS — BP 140/80 | HR 73 | Ht 67.0 in | Wt 234.8 lb

## 2013-10-17 DIAGNOSIS — I428 Other cardiomyopathies: Secondary | ICD-10-CM

## 2013-10-17 DIAGNOSIS — I429 Cardiomyopathy, unspecified: Secondary | ICD-10-CM

## 2013-10-17 DIAGNOSIS — R011 Cardiac murmur, unspecified: Secondary | ICD-10-CM

## 2013-10-17 DIAGNOSIS — Z09 Encounter for follow-up examination after completed treatment for conditions other than malignant neoplasm: Secondary | ICD-10-CM

## 2013-10-17 NOTE — Progress Notes (Signed)
OFFICE NOTE  Chief Complaint:  Mild dyspnea, fatigue  Primary Care Physician: Renold Don, MD  HPI:  [name] is a 32 year old female who unfortunately was diagnosed with Hodgkin's lymphoma. She underwent 4 cycles of ABVD and radiation to the chest and is now 3 months in remission. It appears that somewhere during her chemotherapy she underwent an echocardiogram (I have personally reviewed the images) which demonstrated an EF of about 50-55%.  There is very mild global hypokinesis and stage I diastolic dysfunction in 12/2011.  She is recently having problems with hypertension and was started on beta blocker which is helped somewhat with her symptoms, which included some anxiety and migraine-type headaches. She is quite busy with her 2 jobs and raising a 51-year-old son.  She denies any chest pain or worsening shortness of breath with exertion. There is a small amount of lower edema but she is reports goes away and with elevating her feet.  PMHx:  Past Medical History  Diagnosis Date  . Hypertension   . Complication of anesthesia SLOW TO WAKE UP AFTER C SECTION  . Benign essential HTN 01/19/2012  . Eczema 01/19/2012  . Muscle spasm of back 03/09/2012  . S/P chemotherapy, time since 4-12 weeks 05/11/2012    4 cycles of ABVD with neulasta support and zoladex  . Cough, persistent 07/27/2012  . Leukopenia 08/01/2012  . Asthma     seasonal, worse in spring. Uses inhaler during this time of year  . Migraine   . Hodgkin lymphoma 01/18/2012    left axilla & left neck dx'd 01/16/2012  . DM II (diabetes mellitus, type II), controlled 01/19/2012    Past Surgical History  Procedure Laterality Date  . Dilation and curettage of uterus    . Axillary lymph node biopsy  12/2011    left  . Portacath placement  01/25/2012    Procedure: INSERTION PORT-A-CATH;  Surgeon: Atilano Ina, MD,FACS;  Location: [facility];  Service: General;  Laterality: Right;  right subclavian  . Cesarean section  2008     FAMHx:  Family History  Problem Relation Age of Onset  . Diabetes Maternal Grandmother   . Heart disease Maternal Grandfather   . Cancer Neg Hx     SOCHx:   reports that she quit smoking about 6 years ago. She has never used smokeless tobacco. She reports that she does not drink alcohol or use illicit drugs.  ALLERGIES:  Allergies  Allergen Reactions  . Imitrex [Sumatriptan] Nausea Only  . Penicillins Rash    ROS: A comprehensive review of systems was negative except for: Constitutional: positive for fatigue and insomnia Cardiovascular: positive for lower extremity edema Neurological: positive for headaches and anxiety  HOME MEDS: Current Outpatient Prescriptions  Medication Sig Dispense Refill  . amLODipine (NORVASC) 10 MG tablet Take 1 tablet (10 mg total) by mouth daily.  90 tablet  3  . cyclobenzaprine (FLEXERIL) 10 MG tablet Take 1 tablet (10 mg total) by mouth at bedtime as needed for muscle spasms.  30 tablet  0  . hydrocortisone cream 1 % Apply 1 application topically 2 (two) times daily as needed. For eczema      . lisinopril-hydrochlorothiazide (PRINZIDE,ZESTORETIC) 20-25 MG per tablet Take 1 tablet by mouth daily.  90 tablet  2  . metFORMIN (GLUCOPHAGE) 500 MG tablet Take 500 mg by mouth 2 (two) times daily with a meal.        . metoprolol succinate (TOPROL XL) 50 MG 24 hr tablet  Take 1 tablet (50 mg total) by mouth daily. Take with or immediately following a meal.  30 tablet  1  . Naproxen Sodium (ALEVE PO) Take by mouth as needed (back pain).      . traMADol (ULTRAM) 50 MG tablet Take 1 tablet (50 mg total) by mouth every 8 (eight) hours as needed for pain.  30 tablet  1  . traZODone (DESYREL) 50 MG tablet Take 0.5-1 tablets (25-50 mg total) by mouth at bedtime as needed for sleep.  30 tablet  3  . zolpidem (AMBIEN) 5 MG tablet Take 5-10 mg by mouth at bedtime as needed. For sleep.       No current facility-administered medications for this visit.     LABS/IMAGING: No results found for this or any previous visit (from the past 48 hour(s)). No results found.  VITALS: BP 140/80  Pulse 73  Ht 5\' 7"  (1.702 m)  Wt 234 lb 12.8 oz (106.505 kg)  BMI 36.77 kg/m2  EXAM: General appearance: alert and no distress Neck: no carotid bruit and no JVD Lungs: clear to auscultation bilaterally Heart: regular rate and rhythm, S1, S2 normal, S3 present and systolic murmur: early systolic 2/6, crescendo at 2nd left intercostal space, 1/6 diastolic murmur at LLSB Abdomen: soft, non-tender; bowel sounds normal; no masses,  no organomegaly Extremities: edema trace edema Pulses: 2+ and symmetric Skin: Skin color, texture, turgor normal. No rashes or lesions Neurologic: Grossly normal Psych: Pleasant, but mildly anxious  EKG: Normal sinus rhythm at 73  ASSESSMENT: 1. Mild global hypokinesis secondary to chemotherapy, EF 50-55% 2013, history of ABVD chemotherapy 2. Systolic and faint diastolic murmurs, with a third heart sound 3. Hypertension-controlled 4. Anxiety 5. Hodgkins lymphoma- Stage IIb in remission  PLAN: 1.   [name] is fortunately in remission from her Hodgkin's lymphoma.  She did however have 4 cycles of ABVD chemotherapy and radiation. Her EF in February of 2013 was 50-55% with mild global hypokinesis. There was normal right ventricular function and trace aortic insufficiency with stage I diastolic dysfunction. She does have both systolic and diastolic murmurs today, and what sounds to be more of a mitral murmur but it cleared third heart heart sound and possible midsystolic click. I would like to repeat an echocardiogram to reevaluate LV function and valvular disease, especially given her history of Adriamycin chemotherapy. Her blood pressure does appear better controlled on Norvasc, lisinopril HCTZ and metoprolol. These are adequate medications for her. I'll plan to see her back to discuss results of her echocardiogram once it is  available.  Thanks again for the kind referral.  Chrystie Nose, MD, [facility] Attending Cardiologist [facility]  Dyanne Yorks C 10/17/2013, 9:31 AM

## 2013-10-17 NOTE — Patient Instructions (Signed)
Your physician has requested that you have an echocardiogram. Echocardiography is a painless test that uses sound waves to create images of your heart. It provides your doctor with information about the size and shape of your heart and how well your heart's chambers and valves are working. This procedure takes approximately one hour. There are no restrictions for this procedure.  Your physician recommends that you schedule a follow-up appointment with Dr. Rennis Golden after your echocardiogram.

## 2013-11-05 ENCOUNTER — Ambulatory Visit (HOSPITAL_COMMUNITY)
Admission: RE | Admit: 2013-11-05 | Discharge: 2013-11-05 | Disposition: A | Discharge status: Home / Self Care | Payer: Medicaid Other | Source: Ambulatory Visit | Attending: Cardiovascular Disease | Admitting: Cardiovascular Disease

## 2013-11-05 DIAGNOSIS — I428 Other cardiomyopathies: Secondary | ICD-10-CM | POA: Insufficient documentation

## 2013-11-05 DIAGNOSIS — Z79899 Other long term (current) drug therapy: Secondary | ICD-10-CM | POA: Insufficient documentation

## 2013-11-05 DIAGNOSIS — Z09 Encounter for follow-up examination after completed treatment for conditions other than malignant neoplasm: Secondary | ICD-10-CM

## 2013-11-05 DIAGNOSIS — R011 Cardiac murmur, unspecified: Secondary | ICD-10-CM | POA: Insufficient documentation

## 2013-11-05 DIAGNOSIS — I429 Cardiomyopathy, unspecified: Secondary | ICD-10-CM

## 2013-11-05 NOTE — Progress Notes (Signed)
2D Echo Performed 11/05/2013    Bryston Colocho, RCS  

## 2013-11-11 ENCOUNTER — Ambulatory Visit: Payer: Self-pay | Admitting: Internal Medicine

## 2013-11-13 ENCOUNTER — Encounter (HOSPITAL_COMMUNITY): Payer: Self-pay | Admitting: Emergency Medicine

## 2013-11-13 ENCOUNTER — Emergency Department (HOSPITAL_COMMUNITY): Payer: Medicaid Other

## 2013-11-13 ENCOUNTER — Emergency Department (HOSPITAL_COMMUNITY)
Admission: EM | Admit: 2013-11-13 | Discharge: 2013-11-13 | Disposition: A | Discharge status: Home / Self Care | Payer: Medicaid Other | Attending: Emergency Medicine | Admitting: Emergency Medicine

## 2013-11-13 DIAGNOSIS — Z87891 Personal history of nicotine dependence: Secondary | ICD-10-CM | POA: Insufficient documentation

## 2013-11-13 DIAGNOSIS — I10 Essential (primary) hypertension: Secondary | ICD-10-CM | POA: Insufficient documentation

## 2013-11-13 DIAGNOSIS — Z792 Long term (current) use of antibiotics: Secondary | ICD-10-CM | POA: Insufficient documentation

## 2013-11-13 DIAGNOSIS — E109 Type 1 diabetes mellitus without complications: Secondary | ICD-10-CM | POA: Insufficient documentation

## 2013-11-13 DIAGNOSIS — Z923 Personal history of irradiation: Secondary | ICD-10-CM | POA: Insufficient documentation

## 2013-11-13 DIAGNOSIS — Z880 Allergy status to penicillin: Secondary | ICD-10-CM | POA: Insufficient documentation

## 2013-11-13 DIAGNOSIS — Z862 Personal history of diseases of the blood and blood-forming organs and certain disorders involving the immune mechanism: Secondary | ICD-10-CM | POA: Insufficient documentation

## 2013-11-13 DIAGNOSIS — J069 Acute upper respiratory infection, unspecified: Secondary | ICD-10-CM | POA: Insufficient documentation

## 2013-11-13 DIAGNOSIS — Z872 Personal history of diseases of the skin and subcutaneous tissue: Secondary | ICD-10-CM | POA: Insufficient documentation

## 2013-11-13 DIAGNOSIS — E119 Type 2 diabetes mellitus without complications: Secondary | ICD-10-CM

## 2013-11-13 DIAGNOSIS — Z79899 Other long term (current) drug therapy: Secondary | ICD-10-CM | POA: Insufficient documentation

## 2013-11-13 DIAGNOSIS — G43909 Migraine, unspecified, not intractable, without status migrainosus: Secondary | ICD-10-CM | POA: Insufficient documentation

## 2013-11-13 DIAGNOSIS — Z9221 Personal history of antineoplastic chemotherapy: Secondary | ICD-10-CM | POA: Insufficient documentation

## 2013-11-13 DIAGNOSIS — J45901 Unspecified asthma with (acute) exacerbation: Secondary | ICD-10-CM | POA: Insufficient documentation

## 2013-11-13 DIAGNOSIS — Z8571 Personal history of Hodgkin lymphoma: Secondary | ICD-10-CM | POA: Insufficient documentation

## 2013-11-13 MED ORDER — AZITHROMYCIN 250 MG PO TABS: 250.0000 mg | ORAL_TABLET | Freq: Every day | ORAL | Status: DC

## 2013-11-13 MED ORDER — GUAIFENESIN-CODEINE 100-10 MG/5ML PO SOLN: 5.0000 mL | Freq: Three times a day (TID) | ORAL | Status: DC | PRN

## 2013-11-13 NOTE — ED Notes (Signed)
Pt started having congestion runny nose cough and sneezing on Sunday on Monday throat and body was sore. Now when she coughs her chest is pulling and burning and having problems breathing

## 2013-11-13 NOTE — ED Provider Notes (Signed)
CSN: [id number]     Arrival date & time 11/13/13  1206 History  This chart was scribed for Marlon Pel, PA working with Laray Anger, DO by Quintella Reichert, ED Scribe. This patient was seen in room WTR5/WTR5 and the patient's care was started at 1:17 PM.   Chief Complaint  Patient presents with  . Cough  . Sore Throat  . Nasal Congestion    The history is provided by the patient. No language interpreter was used.    HPI Comments: [name] is a 32 y.o. female with h/o Hodgkin lymphoma, asthma, DM and HTN who presents to the Emergency Department complaining of 3 days of persistent, worsening URI symptoms including cough, congestion and SOB.  Pt states 3 days ago she developed rhinorrhea and nasal congestion.  The following morning she awoke feeling midlly short of breath.  SOB has persisted since then and is worsened by activity.  She also complains of a moderate cough with associated burning central chest pain brought on only by coughing.  She has had intermittent chills but denies fevers.  She states she feels dehydrated and she has not been eating very much.  She states she is drinking a large amount but still feels thirsty.  Pt states that her daughter has also had URI symptoms recently.. She reports her sugar has been normal recently and this morning was 115.  Pt has been in remission from Hodgkin lymphoma for 2 months after receiving chemotherapy and radiation.  She still has a port-a-cath in place.  Her next f/u appointment is in 1-2 months.  PCP is Dr. Gwendolyn Grant   Past Medical History  Diagnosis Date  . Hypertension   . Complication of anesthesia SLOW TO WAKE UP AFTER C SECTION  . Benign essential HTN 01/19/2012  . Eczema 01/19/2012  . Muscle spasm of back 03/09/2012  . S/P chemotherapy, time since 4-12 weeks 05/11/2012    4 cycles of ABVD with neulasta support and zoladex  . Cough, persistent 07/27/2012  . Leukopenia 08/01/2012  . Asthma     seasonal, worse in  spring. Uses inhaler during this time of year  . Migraine   . Hodgkin lymphoma 01/18/2012    left axilla & left neck dx'd 01/16/2012  . DM II (diabetes mellitus, type II), controlled 01/19/2012    Past Surgical History  Procedure Laterality Date  . Dilation and curettage of uterus    . Axillary lymph node biopsy  12/2011    left  . Portacath placement  01/25/2012    Procedure: INSERTION PORT-A-CATH;  Surgeon: Atilano Ina, MD,FACS;  Location: [facility];  Service: General;  Laterality: Right;  right subclavian  . Cesarean section  2008    Family History  Problem Relation Age of Onset  . Diabetes Maternal Grandmother   . Heart disease Maternal Grandfather   . Cancer Neg Hx     History  Substance Use Topics  . Smoking status: Former Smoker    Quit date: 11/19/2006  . Smokeless tobacco: Never Used  . Alcohol Use: No    OB History   Grav Para Term Preterm Abortions TAB SAB Ect Mult Living   3 1 1  2  0 2 0 0 1      Review of Systems  Constitutional: Positive for chills. Negative for fever.  HENT: Positive for congestion.   Respiratory: Positive for cough and shortness of breath.   Cardiovascular: Positive for chest pain (when coughing).  All other systems reviewed  and are negative.     Allergies  Imitrex and Penicillins  Home Medications   Current Outpatient Rx  Name  Route  Sig  Dispense  Refill  . amLODipine (NORVASC) 10 MG tablet   Oral   Take 1 tablet (10 mg total) by mouth daily.   90 tablet   3     Provide the form of the medication that is preferr ...   . cyclobenzaprine (FLEXERIL) 10 MG tablet   Oral   Take 1 tablet (10 mg total) by mouth at bedtime as needed for muscle spasms.   30 tablet   0   . hydrocortisone cream 1 %   Topical   Apply 1 application topically 2 (two) times daily as needed for itching. For eczema         . lisinopril-hydrochlorothiazide (PRINZIDE,ZESTORETIC) 20-25 MG per tablet   Oral   Take 1 tablet by mouth daily.   90  tablet   2   . metFORMIN (GLUCOPHAGE) 500 MG tablet   Oral   Take 500 mg by mouth 2 (two) times daily with a meal.          . metoprolol succinate (TOPROL XL) 50 MG 24 hr tablet   Oral   Take 1 tablet (50 mg total) by mouth daily. Take with or immediately following a meal.   30 tablet   1   . Naproxen Sodium (ALEVE PO)   Oral   Take 2 tablets by mouth as needed (back pain).          . traMADol (ULTRAM) 50 MG tablet   Oral   Take 1 tablet (50 mg total) by mouth every 8 (eight) hours as needed for pain.   30 tablet   1   . traZODone (DESYREL) 50 MG tablet   Oral   Take 0.5-1 tablets (25-50 mg total) by mouth at bedtime as needed for sleep.   30 tablet   3   . zolpidem (AMBIEN) 5 MG tablet   Oral   Take 5-10 mg by mouth at bedtime as needed for sleep. For sleep.         Marland Kitchen azithromycin (ZITHROMAX) 250 MG tablet   Oral   Take 1 tablet (250 mg total) by mouth daily. Take first 2 tablets together, then 1 every day until finished.   6 tablet   0   . guaiFENesin-codeine 100-10 MG/5ML syrup   Oral   Take 5-10 mLs by mouth 3 (three) times daily as needed for cough.   120 mL   0    BP 179/115  Temp(Src) 98.4 F (36.9 C) (Oral)  Resp 18  SpO2 99%  Physical Exam  Nursing note and vitals reviewed. Constitutional: She is oriented to person, place, and time. She appears well-developed and well-nourished. No distress.  HENT:  Head: Normocephalic and atraumatic.  Right Ear: Tympanic membrane normal.  Left Ear: Tympanic membrane normal.  Mouth/Throat: Oropharynx is clear and moist. No oropharyngeal exudate, posterior oropharyngeal edema or posterior oropharyngeal erythema.  Eyes: Conjunctivae and EOM are normal.  Neck: Neck supple. No tracheal deviation present.  Cardiovascular: Normal rate, regular rhythm and normal heart sounds.   No murmur heard. Pulmonary/Chest: Effort normal and breath sounds normal. No respiratory distress. She has no wheezes. She has no rales.   Musculoskeletal: Normal range of motion.  Neurological: She is alert and oriented to person, place, and time.  Skin: Skin is warm and dry.  Psychiatric: She has a normal  mood and affect. Her behavior is normal.    ED Course  Procedures (including critical care time)  DIAGNOSTIC STUDIES: Oxygen Saturation is 99% on room air, normal by my interpretation.    COORDINATION OF CARE: 1:24 PM-Discussed treatment plan which includes antibiotics and cough syrup with pt at bedside and pt agreed to plan.    Labs Review Labs Reviewed - No data to display  Imaging Review No results found.  EKG Interpretation   None       MDM   1. URI (upper respiratory infection)   2. Diabetes    azithromycin (ZITHROMAX) 250 MG tablet Take 1 tablet (250 mg total) by mouth daily. Take first 2 tablets together, then 1 every day until finished. 6 tablet Dorthula Matas, PA-C guaiFENesin-codeine 100-10 MG/5ML syrup Take 5-10 mLs by mouth 3 (three) times daily as needed for cough.   32 y.o.[name]'s evaluation in the Emergency Department is complete. It has been determined that no acute conditions requiring further emergency intervention are present at this time. The patient/guardian have been advised of the diagnosis and plan. We have discussed signs and symptoms that warrant return to the ED, such as changes or worsening in symptoms.  Vital signs are stable at discharge. Filed Vitals:   11/13/13 1238  BP: 179/115  Temp:   Resp:     Patient/guardian has voiced understanding and agreed to follow-up with the PCP or specialist.  I personally performed the services described in this documentation, which was scribed in my presence. The recorded information has been reviewed and is accurate.   Dorthula Matas, PA-C 11/13/13 1407

## 2013-11-14 NOTE — ED Provider Notes (Signed)
Medical screening examination/treatment/procedure(s) were performed by non-physician practitioner and as supervising physician I was immediately available for consultation/collaboration.  EKG Interpretation   None         Laray Anger, DO 11/14/13 1631

## 2013-12-03 ENCOUNTER — Ambulatory Visit (INDEPENDENT_AMBULATORY_CARE_PROVIDER_SITE_OTHER): Payer: Medicaid Other | Admitting: Family Medicine

## 2013-12-03 ENCOUNTER — Encounter: Payer: Self-pay | Admitting: Family Medicine

## 2013-12-03 VITALS — BP 170/130 | HR 81 | Temp 98.8°F | Ht 67.0 in | Wt 232.0 lb

## 2013-12-03 DIAGNOSIS — I10 Essential (primary) hypertension: Secondary | ICD-10-CM

## 2013-12-03 DIAGNOSIS — R51 Headache: Secondary | ICD-10-CM

## 2013-12-03 DIAGNOSIS — R059 Cough, unspecified: Secondary | ICD-10-CM

## 2013-12-03 DIAGNOSIS — E119 Type 2 diabetes mellitus without complications: Secondary | ICD-10-CM

## 2013-12-03 DIAGNOSIS — R05 Cough: Secondary | ICD-10-CM

## 2013-12-03 LAB — CBC WITH DIFFERENTIAL/PLATELET
BASOS ABS: 0 10*3/uL (ref 0.0–0.1)
Basophils Relative: 1 % (ref 0–1)
EOS PCT: 6 % — AB (ref 0–5)
Eosinophils Absolute: 0.2 10*3/uL (ref 0.0–0.7)
HEMATOCRIT: 39.7 % (ref 36.0–46.0)
Hemoglobin: 13.8 g/dL (ref 12.0–15.0)
LYMPHS ABS: 1.1 10*3/uL (ref 0.7–4.0)
LYMPHS PCT: 37 % (ref 12–46)
MCH: 32.4 pg (ref 26.0–34.0)
MCHC: 34.8 g/dL (ref 30.0–36.0)
MCV: 93.2 fL (ref 78.0–100.0)
MONO ABS: 0.3 10*3/uL (ref 0.1–1.0)
Monocytes Relative: 10 % (ref 3–12)
Neutro Abs: 1.4 10*3/uL — ABNORMAL LOW (ref 1.7–7.7)
Neutrophils Relative %: 46 % (ref 43–77)
Platelets: 219 10*3/uL (ref 150–400)
RBC: 4.26 MIL/uL (ref 3.87–5.11)
RDW: 13.2 % (ref 11.5–15.5)
WBC: 3.1 10*3/uL — AB (ref 4.0–10.5)

## 2013-12-03 LAB — POCT GLYCOSYLATED HEMOGLOBIN (HGB A1C): Hemoglobin A1C: 6.1

## 2013-12-03 NOTE — Progress Notes (Signed)
Subjective:    [name] is a 33 y.o. female who presents to [facility] today for ER FU:  1.  URI symptoms with cough:  Patient seen in ED on 12/17, diagnosed with URI at that time.  Received Azithro with only minimal improvement.  Concerned b/c no CXR obtained in ED.  Still with persistent cough today.  Occasional chills at home.  No known fevers.  Productive of thick green sputum at times, dry hacking at others.  Not worse any times of day.  No nausea/vomiting.    History Hodgkin's Lymphoma.  In remission.    2. Hypertension:  Long-term problem for this patient.  No adverse effects from medication.  Taking her medications daily.  Not checking it regularly outside of the office.  Does report her usual chronic headaches, not acutely worsened.  Denies any CP, dizziness, shortness of breath, palpitations, or LE swelling.  Endorses taking her medications daily.   BP Readings from Last 3 Encounters:  12/03/13 186/136  11/13/13 179/115  10/17/13 140/80    Prev. Health:  Declines pap smear, tetanus today.    The following portions of the patient's history were reviewed and updated as appropriate: allergies, current medications, past medical history, family and social history, and problem list. Patient is a nonsmoker.    PMH reviewed.  Past Medical History  Diagnosis Date  . Hypertension   . Complication of anesthesia SLOW TO WAKE UP AFTER C SECTION  . Benign essential HTN 01/19/2012  . Eczema 01/19/2012  . Muscle spasm of back 03/09/2012  . S/P chemotherapy, time since 4-12 weeks 05/11/2012    4 cycles of ABVD with neulasta support and zoladex  . Cough, persistent 07/27/2012  . Leukopenia 08/01/2012  . Asthma     seasonal, worse in spring. Uses inhaler during this time of year  . Migraine   . Hodgkin lymphoma 01/18/2012    left axilla & left neck dx'd 01/16/2012  . DM II (diabetes mellitus, type II), controlled 01/19/2012   Past Surgical History  Procedure Laterality Date  . Dilation and  curettage of uterus    . Axillary lymph node biopsy  12/2011    left  . Portacath placement  01/25/2012    Procedure: INSERTION PORT-A-CATH;  Surgeon: Gayland Curry, MD,FACS;  Location: [facility];  Service: General;  Laterality: Right;  right subclavian  . Cesarean section  2008    Medications reviewed. Current Outpatient Prescriptions  Medication Sig Dispense Refill  . amLODipine (NORVASC) 10 MG tablet Take 1 tablet (10 mg total) by mouth daily.  90 tablet  3  . azithromycin (ZITHROMAX) 250 MG tablet Take 1 tablet (250 mg total) by mouth daily. Take first 2 tablets together, then 1 every day until finished.  6 tablet  0  . cyclobenzaprine (FLEXERIL) 10 MG tablet Take 1 tablet (10 mg total) by mouth at bedtime as needed for muscle spasms.  30 tablet  0  . guaiFENesin-codeine 100-10 MG/5ML syrup Take 5-10 mLs by mouth 3 (three) times daily as needed for cough.  120 mL  0  . hydrocortisone cream 1 % Apply 1 application topically 2 (two) times daily as needed for itching. For eczema      . lisinopril-hydrochlorothiazide (PRINZIDE,ZESTORETIC) 20-25 MG per tablet Take 1 tablet by mouth daily.  90 tablet  2  . metFORMIN (GLUCOPHAGE) 500 MG tablet Take 500 mg by mouth 2 (two) times daily with a meal.       . metoprolol succinate (TOPROL  XL) 50 MG 24 hr tablet Take 1 tablet (50 mg total) by mouth daily. Take with or immediately following a meal.  30 tablet  1  . Naproxen Sodium (ALEVE PO) Take 2 tablets by mouth as needed (back pain).       . traMADol (ULTRAM) 50 MG tablet Take 1 tablet (50 mg total) by mouth every 8 (eight) hours as needed for pain.  30 tablet  1  . traZODone (DESYREL) 50 MG tablet Take 0.5-1 tablets (25-50 mg total) by mouth at bedtime as needed for sleep.  30 tablet  3  . zolpidem (AMBIEN) 5 MG tablet Take 5-10 mg by mouth at bedtime as needed for sleep. For sleep.       No current facility-administered medications for this visit.    ROS as above otherwise neg.  No chest pain,  palpitations, SOB, Fever, Chills, Abd pain, N/V/D.   Objective:   Physical Exam There were no vitals taken for this visit. Gen:  Alert, cooperative patient who appears stated age in no acute distress.  Vital signs reviewed. HEENT: EOMI,  MMM.   Neck:  No thyromegaly noted.  Cardiac:  Regular rate and rhythm Pulm:  Scattered wheezing throughout.  No focal findings.  Abd:  Benign.  No tenderness.   Exts: Non edematous BL  LE, warm and well perfused.   No results found for this or any previous visit (from the past 72 hour(s)).

## 2013-12-03 NOTE — Patient Instructions (Signed)
We will get a chest xray for your cough.  I will call and let you know what that is.    We will check your thyroid function today.    Let's see what cardiology say about your blood pressure.  I'll get you set up for a sleep study if your pressure is still high.  If your pressure remains high, we can incease the metoprolol.    It was good to see you today.  I'll be talking with you soon.

## 2013-12-04 ENCOUNTER — Telehealth: Payer: Self-pay | Admitting: Family Medicine

## 2013-12-04 ENCOUNTER — Ambulatory Visit
Admission: RE | Admit: 2013-12-04 | Discharge: 2013-12-04 | Disposition: A | Discharge status: Home / Self Care | Payer: Medicaid Other | Source: Ambulatory Visit | Attending: Family Medicine | Admitting: Family Medicine

## 2013-12-04 DIAGNOSIS — J45901 Unspecified asthma with (acute) exacerbation: Secondary | ICD-10-CM

## 2013-12-04 DIAGNOSIS — R059 Cough, unspecified: Secondary | ICD-10-CM | POA: Insufficient documentation

## 2013-12-04 DIAGNOSIS — R05 Cough: Secondary | ICD-10-CM

## 2013-12-04 LAB — COMPREHENSIVE METABOLIC PANEL
ALT: 16 U/L (ref 0–35)
AST: 14 U/L (ref 0–37)
Albumin: 4.2 g/dL (ref 3.5–5.2)
Alkaline Phosphatase: 56 U/L (ref 39–117)
BILIRUBIN TOTAL: 0.5 mg/dL (ref 0.3–1.2)
BUN: 11 mg/dL (ref 6–23)
CO2: 27 meq/L (ref 19–32)
CREATININE: 0.79 mg/dL (ref 0.50–1.10)
Calcium: 8.7 mg/dL (ref 8.4–10.5)
Chloride: 105 mEq/L (ref 96–112)
Glucose, Bld: 124 mg/dL — ABNORMAL HIGH (ref 70–99)
Potassium: 4.3 mEq/L (ref 3.5–5.3)
Sodium: 140 mEq/L (ref 135–145)
Total Protein: 6.7 g/dL (ref 6.0–8.3)

## 2013-12-04 LAB — TSH: TSH: 12.43 u[IU]/mL — ABNORMAL HIGH (ref 0.350–4.500)

## 2013-12-04 NOTE — Assessment & Plan Note (Signed)
Not at goal. Recommend increasing to 100 mg Toprol -- however she is leery of this currently. Also taking Excedrin with caffeine multiple times daily, which could be root cause. Has FU with cardiology in 2 days.  To stop caffeine and see what her BP is at that point.

## 2013-12-04 NOTE — Assessment & Plan Note (Signed)
Needs A1C -- obtained today

## 2013-12-04 NOTE — Assessment & Plan Note (Signed)
Somewhat improved, though worsened with worsened blood pressure. No red flags.   [name] also medication overuse as she's had daily headaches for years.  Still not sleeping more than 3-4 hours at night.

## 2013-12-04 NOTE — Telephone Encounter (Signed)
Called and left message on voicemail (mobile phone) for patient to call back.  Need to discuss: - CXR results - thyroid tests - treatment for cough Will await call back

## 2013-12-04 NOTE — Assessment & Plan Note (Addendum)
Due to history of hodgkin's plus multiple prior PNA's plus lack of improvement, plan to send for CXR. Will call with results. Bronchitis most likely possibility.  Rest of URI sx's mostly resolved.

## 2013-12-05 ENCOUNTER — Encounter: Payer: Self-pay | Admitting: Internal Medicine

## 2013-12-05 ENCOUNTER — Ambulatory Visit (INDEPENDENT_AMBULATORY_CARE_PROVIDER_SITE_OTHER): Payer: Medicaid Other | Admitting: Internal Medicine

## 2013-12-05 VITALS — BP 160/100 | HR 66 | Ht 67.5 in | Wt 234.2 lb

## 2013-12-05 DIAGNOSIS — F411 Generalized anxiety disorder: Secondary | ICD-10-CM

## 2013-12-05 DIAGNOSIS — I10 Essential (primary) hypertension: Secondary | ICD-10-CM

## 2013-12-05 DIAGNOSIS — R011 Cardiac murmur, unspecified: Secondary | ICD-10-CM

## 2013-12-05 NOTE — Telephone Encounter (Signed)
Pt returned call

## 2013-12-05 NOTE — Progress Notes (Signed)
OFFICE NOTE  Chief Complaint:  Mild dyspnea, fatigue  Primary Care Physician: Annabell Sabal, MD  HPI:  [name] is a 33 year old female who unfortunately was diagnosed with Hodgkin's lymphoma. She underwent 4 cycles of ABVD and radiation to the chest and is now 3 months in remission. It appears that somewhere during her chemotherapy she underwent an echocardiogram (I have personally reviewed the images) which demonstrated an EF of about 50-55%.  There is very mild global hypokinesis and stage I diastolic dysfunction in 04/7590.  She is recently having problems with hypertension and was started on beta blocker which is helped somewhat with her symptoms, which included some anxiety and migraine-type headaches. She is quite busy with her 2 jobs and raising a 59-year-old son.  She denies any chest pain or worsening shortness of breath with exertion. There is a small amount of lower edema but she is reports goes away and with elevating her feet.  [name] returns today for followup on her echo. This shows essentially normal systolic function with very mild diastolic dysfunction and normal filling pressures. She did have mild to moderate mitral regurgitation. Overall this is very reassuring especially given her history of chemotherapy. It does appear that her heart has almost completely recovered.  I think a lot of her symptoms are due to stress, working multiple jobs and getting very poor sleep. Recently she had an upper respiratory infection and still feels like she may have some bronchitis. She had a chest x-ray which did not show any pneumonia or any acute process, but there is some old lung scarring.  PMHx:  Past Medical History  Diagnosis Date  . Hypertension   . Complication of anesthesia SLOW TO WAKE UP AFTER C SECTION  . Benign essential HTN 01/19/2012  . Eczema 01/19/2012  . Muscle spasm of back 03/09/2012  . S/P chemotherapy, time since 4-12 weeks 05/11/2012    4 cycles of  ABVD with neulasta support and zoladex  . Cough, persistent 07/27/2012  . Leukopenia 08/01/2012  . Asthma     seasonal, worse in spring. Uses inhaler during this time of year  . Migraine   . Hodgkin lymphoma 01/18/2012    left axilla & left neck dx'd 01/16/2012  . DM II (diabetes mellitus, type II), controlled 01/19/2012    Past Surgical History  Procedure Laterality Date  . Dilation and curettage of uterus    . Axillary lymph node biopsy  12/2011    left  . Portacath placement  01/25/2012    Procedure: INSERTION PORT-A-CATH;  Surgeon: Gayland Curry, MD,FACS;  Location: [facility];  Service: General;  Laterality: Right;  right subclavian  . Cesarean section  2008    FAMHx:  Family History  Problem Relation Age of Onset  . Diabetes Maternal Grandmother   . Heart disease Maternal Grandfather   . Cancer Neg Hx     SOCHx:   reports that she quit smoking about 7 years ago. She has never used smokeless tobacco. She reports that she does not drink alcohol or use illicit drugs.  ALLERGIES:  Allergies  Allergen Reactions  . Imitrex [Sumatriptan] Nausea Only  . Penicillins Rash    ROS: A comprehensive review of systems was negative except for: Constitutional: positive for fatigue and insomnia Cardiovascular: positive for lower extremity edema Neurological: positive for headaches and anxiety  HOME MEDS: Current Outpatient Prescriptions  Medication Sig Dispense Refill  . amLODipine (NORVASC) 10 MG tablet Take 1 tablet (10 mg  total) by mouth daily.  90 tablet  3  . cyclobenzaprine (FLEXERIL) 10 MG tablet Take 1 tablet (10 mg total) by mouth at bedtime as needed for muscle spasms.  30 tablet  0  . hydrocortisone cream 1 % Apply 1 application topically 2 (two) times daily as needed for itching. For eczema      . lisinopril-hydrochlorothiazide (PRINZIDE,ZESTORETIC) 20-25 MG per tablet Take 1 tablet by mouth daily.  90 tablet  2  . metFORMIN (GLUCOPHAGE) 500 MG tablet Take 500 mg by mouth 2  (two) times daily with a meal.       . metoprolol succinate (TOPROL XL) 50 MG 24 hr tablet Take 1 tablet (50 mg total) by mouth daily. Take with or immediately following a meal.  30 tablet  1  . Naproxen Sodium (ALEVE PO) Take 2 tablets by mouth as needed (back pain).       . traMADol (ULTRAM) 50 MG tablet Take 1 tablet (50 mg total) by mouth every 8 (eight) hours as needed for pain.  30 tablet  1  . traZODone (DESYREL) 50 MG tablet Take 0.5-1 tablets (25-50 mg total) by mouth at bedtime as needed for sleep.  30 tablet  3  . zolpidem (AMBIEN) 5 MG tablet Take 5-10 mg by mouth at bedtime as needed for sleep. For sleep.       No current facility-administered medications for this visit.    LABS/IMAGING: No results found for this or any previous visit (from the past 48 hour(s)). Dg Chest 2 View  12/04/2013   CLINICAL DATA:  History of Hodgkin's lymphoma, cough, shortness of breath, fever  EXAM: CHEST  2 VIEW  COMPARISON:  08/06/2013, 06/04/2013  FINDINGS: Normal heart size and vascularity. Chronic medial left upper lobe parenchymal density compatible with scarring. No superimposed acute pneumonia, edema, collapse or consolidation. No effusion or pneumothorax. Trachea midline. Right subclavian port catheter tip upper SVC.  IMPRESSION: Stable chronic changes.  No superimposed acute process   Electronically Signed   By: Daryll Brod M.D.   On: 12/04/2013 08:29    VITALS: BP 160/100  Pulse 66  Ht 5' 7.5" (1.715 m)  Wt 234 lb 3.2 oz (106.232 kg)  BMI 36.12 kg/m2  EXAM: deferred  EKG: deferred  ASSESSMENT: 1. EF 55-60% on recent echo 2. Systolic mumur- secondary to MR 3. Hypertension-labile 4. Anxiety 5. Hodgkins lymphoma- Stage IIb in remission  PLAN: 1.   [name] has a reassuring echocardiogram shows a preserved EF which is actually improved from when she had been undergoing chemotherapy. I do not think that that is contributing to her symptoms. I agree that some of her fatigue may  be related to poor sleep which is probably related to multiple jobs, however sleep apnea is a consideration and I understand you plan to test her for this. She does have somewhat of a bronchitic type cough however her chest x-ray looks pretty normal. I wonder if her episode several weeks ago may have been influenza. She may benefit from some inhalers for a short course of steroids as she does report some wheezing.  We'll plan followup as needed. Thanks again for the kind referral.  Pixie Casino, MD, [facility] Attending Cardiologist [facility]  Laylynn Campanella C 12/05/2013, 10:54 AM

## 2013-12-05 NOTE — Patient Instructions (Signed)
Your physician recommends that you schedule a follow-up appointment as needed  

## 2013-12-06 MED ORDER — MINOCYCLINE HCL 100 MG PO CAPS: 100.0000 mg | ORAL_CAPSULE | Freq: Two times a day (BID) | ORAL | Status: DC

## 2013-12-06 MED ORDER — ALBUTEROL SULFATE (2.5 MG/3ML) 0.083% IN NEBU: 2.5000 mg | INHALATION_SOLUTION | Freq: Four times a day (QID) | RESPIRATORY_TRACT | Status: DC | PRN

## 2013-12-06 MED ORDER — ALBUTEROL SULFATE HFA 108 (90 BASE) MCG/ACT IN AERS: 2.0000 | INHALATION_SPRAY | Freq: Four times a day (QID) | RESPIRATORY_TRACT | Status: DC | PRN

## 2013-12-06 MED ORDER — PREDNISONE 50 MG PO TABS: ORAL_TABLET | ORAL | Status: DC

## 2013-12-06 MED ORDER — HYDROCODONE-HOMATROPINE 5-1.5 MG/5ML PO SYRP: 5.0000 mL | ORAL_SOLUTION | Freq: Three times a day (TID) | ORAL | Status: DC | PRN

## 2013-12-06 NOTE — Telephone Encounter (Signed)
Called and again left message.  Please page me if patient calls back this PM -- [phone].

## 2013-12-06 NOTE — Telephone Encounter (Signed)
Finally got through to patient.  Negative CXR, likely bronchitis superimposed on poorly treated asthma.  Treat with prednisone, doxycycline, albuterol for asthma exac.  Concern is that she cannot afford albuterol HFA.  Will attempt nebulizer maching over the computer, but that hasn't worked for me in the past.  She is unable to pick up a prescription today before we close due to work.  Will therefore send in hycodan cough syrup.  FU with me in 2-3 weeks to ensure she's improved, sooner if worsening.  Also, TSH elevated.  Discussed this.  Will need repeat next visit along with T4.  Possibly elevated secondary to illness, but will treat if remains elevated.

## 2014-01-09 ENCOUNTER — Other Ambulatory Visit (HOSPITAL_COMMUNITY)
Admission: RE | Admit: 2014-01-09 | Discharge: 2014-01-09 | Disposition: A | Discharge status: Home / Self Care | Payer: Medicaid Other | Source: Ambulatory Visit | Attending: Family Medicine | Admitting: Family Medicine

## 2014-01-09 ENCOUNTER — Ambulatory Visit (INDEPENDENT_AMBULATORY_CARE_PROVIDER_SITE_OTHER): Payer: BC Managed Care – PPO | Admitting: Family Medicine

## 2014-01-09 ENCOUNTER — Encounter: Payer: Self-pay | Admitting: Family Medicine

## 2014-01-09 VITALS — BP 162/92 | HR 87 | Temp 98.9°F | Ht 67.0 in | Wt 236.2 lb

## 2014-01-09 DIAGNOSIS — Z113 Encounter for screening for infections with a predominantly sexual mode of transmission: Secondary | ICD-10-CM | POA: Insufficient documentation

## 2014-01-09 DIAGNOSIS — I10 Essential (primary) hypertension: Secondary | ICD-10-CM

## 2014-01-09 DIAGNOSIS — E119 Type 2 diabetes mellitus without complications: Secondary | ICD-10-CM

## 2014-01-09 DIAGNOSIS — R809 Proteinuria, unspecified: Secondary | ICD-10-CM

## 2014-01-09 DIAGNOSIS — N760 Acute vaginitis: Secondary | ICD-10-CM

## 2014-01-09 DIAGNOSIS — E039 Hypothyroidism, unspecified: Secondary | ICD-10-CM

## 2014-01-09 DIAGNOSIS — A499 Bacterial infection, unspecified: Secondary | ICD-10-CM

## 2014-01-09 DIAGNOSIS — C8190 Hodgkin lymphoma, unspecified, unspecified site: Secondary | ICD-10-CM

## 2014-01-09 DIAGNOSIS — B9689 Other specified bacterial agents as the cause of diseases classified elsewhere: Secondary | ICD-10-CM

## 2014-01-09 DIAGNOSIS — R300 Dysuria: Secondary | ICD-10-CM

## 2014-01-09 LAB — POCT URINALYSIS DIPSTICK
BILIRUBIN UA: NEGATIVE
Blood, UA: NEGATIVE
GLUCOSE UA: NEGATIVE
KETONES UA: NEGATIVE
LEUKOCYTES UA: NEGATIVE
Nitrite, UA: NEGATIVE
Protein, UA: 100
Spec Grav, UA: >=1.03
Urobilinogen, UA: 0.2
pH, UA: 6

## 2014-01-09 LAB — POCT WET PREP (WET MOUNT): Clue Cells Wet Prep Whiff POC: POSITIVE

## 2014-01-09 LAB — POCT UA - MICROSCOPIC ONLY

## 2014-01-09 LAB — TSH: TSH: 6.52 u[IU]/mL — ABNORMAL HIGH (ref 0.350–4.500)

## 2014-01-09 MED ORDER — METRONIDAZOLE 500 MG PO TABS: 500.0000 mg | ORAL_TABLET | Freq: Two times a day (BID) | ORAL | Status: DC

## 2014-01-09 NOTE — Progress Notes (Addendum)
Subjective:    [name] is a 33 y.o. female who presents to [facility] today for several issues:  1.  FU for low TSH:  Found to have TSH 12 last visit.  Complains of weight gain and inability to lose weight, occasional "cold flashes."  No palpitations or dyspnea, no LE edema  2.  Urinary symptoms:  Has had some increased frequency urination.  Also with discharge, some low back pain and discomfort during sex. Present for only 2 weeks.  Also with discharge but no odor.  3.  HTN:  Remains high.  Endorses taking her medications daily.  States she has already taken them today.  No chest pain, palpitations, LE edema.  4.  Left axillary soreness:  Present for past 2-3 weeks.  States "sore" when lifting her arm or when she presses deep on the area.  Not consistently sore.  No lumps or bumps under the skin that she's found.   ROS as above per HPI, otherwise neg.  Pertinently, no chest pain, palpitations, SOB, Fever, Chills, Abd pain, N/V/D.   The following portions of the patient's history were reviewed and updated as appropriate: allergies, current medications, past medical history, family and social history, and problem list. Patient is still smoking.    PMH reviewed.  Past Medical History  Diagnosis Date  . Hypertension   . Complication of anesthesia SLOW TO WAKE UP AFTER C SECTION  . Benign essential HTN 01/19/2012  . Eczema 01/19/2012  . Muscle spasm of back 03/09/2012  . S/P chemotherapy, time since 4-12 weeks 05/11/2012    4 cycles of ABVD with neulasta support and zoladex  . Cough, persistent 07/27/2012  . Leukopenia 08/01/2012  . Asthma     seasonal, worse in spring. Uses inhaler during this time of year  . Migraine   . Hodgkin lymphoma 01/18/2012    left axilla & left neck dx'd 01/16/2012  . DM II (diabetes mellitus, type II), controlled 01/19/2012   Past Surgical History  Procedure Laterality Date  . Dilation and curettage of uterus    . Axillary lymph node biopsy  12/2011    left   . Portacath placement  01/25/2012    Procedure: INSERTION PORT-A-CATH;  Surgeon: Gayland Curry, MD,FACS;  Location: [facility];  Service: General;  Laterality: Right;  right subclavian  . Cesarean section  2008    Medications reviewed. Current Outpatient Prescriptions  Medication Sig Dispense Refill  . albuterol (PROVENTIL HFA;VENTOLIN HFA) 108 (90 BASE) MCG/ACT inhaler Inhale 2 puffs into the lungs every 6 (six) hours as needed for wheezing or shortness of breath.  1 Inhaler  0  . albuterol (PROVENTIL) (2.5 MG/3ML) 0.083% nebulizer solution Take 3 mLs (2.5 mg total) by nebulization every 6 (six) hours as needed for wheezing or shortness of breath.  150 mL  1  . amLODipine (NORVASC) 10 MG tablet Take 1 tablet (10 mg total) by mouth daily.  90 tablet  3  . cyclobenzaprine (FLEXERIL) 10 MG tablet Take 1 tablet (10 mg total) by mouth at bedtime as needed for muscle spasms.  30 tablet  0  . HYDROcodone-homatropine (HYCODAN) 5-1.5 MG/5ML syrup Take 5 mLs by mouth every 8 (eight) hours as needed for cough.  120 mL  0  . hydrocortisone cream 1 % Apply 1 application topically 2 (two) times daily as needed for itching. For eczema      . lisinopril-hydrochlorothiazide (PRINZIDE,ZESTORETIC) 20-25 MG per tablet Take 1 tablet by mouth daily.  90 tablet  2  . metFORMIN (GLUCOPHAGE) 500 MG tablet Take 500 mg by mouth 2 (two) times daily with a meal.       . metoprolol succinate (TOPROL XL) 50 MG 24 hr tablet Take 1 tablet (50 mg total) by mouth daily. Take with or immediately following a meal.  30 tablet  1  . metroNIDAZOLE (FLAGYL) 500 MG tablet Take 1 tablet (500 mg total) by mouth 2 (two) times daily.  14 tablet  0  . minocycline (MINOCIN,DYNACIN) 100 MG capsule Take 1 capsule (100 mg total) by mouth 2 (two) times daily. X 7 days  14 capsule  0  . Naproxen Sodium (ALEVE PO) Take 2 tablets by mouth as needed (back pain).       . predniSONE (DELTASONE) 50 MG tablet 1 tab po daily at breakfast with food  5 tablet   0  . traMADol (ULTRAM) 50 MG tablet Take 1 tablet (50 mg total) by mouth every 8 (eight) hours as needed for pain.  30 tablet  1  . traZODone (DESYREL) 50 MG tablet Take 0.5-1 tablets (25-50 mg total) by mouth at bedtime as needed for sleep.  30 tablet  3  . zolpidem (AMBIEN) 5 MG tablet Take 5-10 mg by mouth at bedtime as needed for sleep. For sleep.       No current facility-administered medications for this visit.     Objective:   Physical Exam BP 182/117  Pulse 87  Temp(Src) 98.9 F (37.2 C) (Oral)  Ht 5\' 7"  (1.702 m)  Wt 236 lb 3.2 oz (107.14 kg)  BMI 36.99 kg/m2 Gen:  Alert, cooperative patient who appears stated age in no acute distress.  Vital signs reviewed. HEENT: EOMI,  MMM Neck:  No thyromegaly.  Chest:  Right axilla WNL.  Left axilla with some mild tenderness on deep palpation.  I cannot palpate any lymph nodes.  No rash or lesions noted.   Cardiac:  Regular rate and rhythm  Pulm:  Clear to auscultation bilaterally    GYN:  External genitalia within normal limits.  Vaginal mucosa pink, moist, normal rugae.  Nonfriable cervix without lesions, some thin white/green discharge wihtout bleeding noted on speculum exam.    Nurse chaperone present for entire examination   Results for orders placed in visit on 01/09/14 (from the past 72 hour(s))  POCT URINALYSIS DIPSTICK     Status: Abnormal   Collection Time    01/09/14  4:14 PM      Result Value Ref Range   Color, UA DARK YELLOW     Clarity, UA CLEAR     Glucose, UA NEG     Bilirubin, UA NEG     Ketones, UA NEG     Spec Grav, UA >=1.030     Blood, UA NEG     pH, UA 6.0     Protein, UA 100     Urobilinogen, UA 0.2     Nitrite, UA NEG     Leukocytes, UA Negative    POCT WET PREP (WET MOUNT)     Status: Abnormal   Collection Time    01/09/14  4:14 PM      Result Value Ref Range   Source Wet Prep POC VAG     WBC, Wet Prep HPF POC 5-10     Bacteria Wet Prep HPF POC 3+ COCCI     Clue Cells Wet Prep HPF POC Many      CLUE CELLS WET PREP WHIFF POC Positive Whiff  Yeast Wet Prep HPF POC Few     Trichomonas Wet Prep HPF POC None    POCT UA - MICROSCOPIC ONLY     Status: None   Collection Time    01/09/14  4:14 PM      Result Value Ref Range   WBC, Ur, HPF, POC RARE     RBC, urine, microscopic OCC     Bacteria, U Microscopic 1+     Comment: COCCI   Mucus, UA 2+     Epithelial cells, urine per micros 4-8     Comment: FEW CLUE CELLS

## 2014-01-10 DIAGNOSIS — E038 Other specified hypothyroidism: Secondary | ICD-10-CM | POA: Insufficient documentation

## 2014-01-10 DIAGNOSIS — R809 Proteinuria, unspecified: Secondary | ICD-10-CM | POA: Insufficient documentation

## 2014-01-10 HISTORY — DX: Proteinuria, unspecified: R80.9

## 2014-01-10 MED ORDER — METOPROLOL SUCCINATE ER 100 MG PO TB24: 100.0000 mg | ORAL_TABLET | Freq: Every day | ORAL | Status: DC

## 2014-01-10 NOTE — Assessment & Plan Note (Signed)
Called and discussed with patient.  She admits to dehydration. Will need to repeat U/A to ensure not secondary to long-standing HTN Not likely secondary to DM2 due to control

## 2014-01-10 NOTE — Assessment & Plan Note (Signed)
A little better on repeat but not at goal. Question compliance Increasing metoprolol to 100 mg  Would benefit from referral to pharm clinic for outpatient BP monitoring.

## 2014-01-10 NOTE — Addendum Note (Signed)
Addended byMingo Amber, Kayleen Memos on: 01/10/2014 04:24 PM   Modules accepted: Orders

## 2014-01-10 NOTE — Assessment & Plan Note (Signed)
A1C 6.1 in Jan No changes

## 2014-01-10 NOTE — Assessment & Plan Note (Signed)
BV to treat

## 2014-01-10 NOTE — Assessment & Plan Note (Addendum)
Repeat TSH today. High risk for hypothyroid after chemo/radiation therapy If decreasing, will hold off on starting synthroid in light of pulse/HTN

## 2014-01-13 ENCOUNTER — Telehealth: Payer: Self-pay | Admitting: Family Medicine

## 2014-01-13 LAB — CERVICOVAGINAL ANCILLARY ONLY
Chlamydia: NEGATIVE
Neisseria Gonorrhea: NEGATIVE

## 2014-01-13 NOTE — Telephone Encounter (Signed)
Called and discussed thyroid testing.  Last was 12.5.  Currently 6.5.  Will retest in about 1 month.  If the same or higher will treat, if continues to drop within normal no need to treat.  Had to leave message on voicemail, which patient okayed last visit.

## 2014-02-04 ENCOUNTER — Other Ambulatory Visit (HOSPITAL_BASED_OUTPATIENT_CLINIC_OR_DEPARTMENT_OTHER): Payer: BC Managed Care – PPO

## 2014-02-04 ENCOUNTER — Ambulatory Visit (HOSPITAL_COMMUNITY)
Admission: RE | Admit: 2014-02-04 | Discharge: 2014-02-04 | Disposition: A | Discharge status: Home / Self Care | Payer: BC Managed Care – PPO | Source: Ambulatory Visit | Attending: Oncology | Admitting: Oncology

## 2014-02-04 ENCOUNTER — Encounter (HOSPITAL_COMMUNITY): Payer: Self-pay

## 2014-02-04 DIAGNOSIS — Z923 Personal history of irradiation: Secondary | ICD-10-CM | POA: Insufficient documentation

## 2014-02-04 DIAGNOSIS — C8190 Hodgkin lymphoma, unspecified, unspecified site: Secondary | ICD-10-CM

## 2014-02-04 DIAGNOSIS — Z9221 Personal history of antineoplastic chemotherapy: Secondary | ICD-10-CM | POA: Insufficient documentation

## 2014-02-04 DIAGNOSIS — C8118 Nodular sclerosis classical Hodgkin lymphoma, lymph nodes of multiple sites: Secondary | ICD-10-CM

## 2014-02-04 LAB — COMPREHENSIVE METABOLIC PANEL (CC13)
ALT: 18 U/L (ref 0–55)
ANION GAP: 7 meq/L (ref 3–11)
AST: 12 U/L (ref 5–34)
Albumin: 3.7 g/dL (ref 3.5–5.0)
Alkaline Phosphatase: 56 U/L (ref 40–150)
BILIRUBIN TOTAL: 0.36 mg/dL (ref 0.20–1.20)
BUN: 19.1 mg/dL (ref 7.0–26.0)
CO2: 24 mEq/L (ref 22–29)
CREATININE: 0.9 mg/dL (ref 0.6–1.1)
Calcium: 9.2 mg/dL (ref 8.4–10.4)
Chloride: 107 mEq/L (ref 98–109)
Glucose: 138 mg/dl (ref 70–140)
Potassium: 4.4 mEq/L (ref 3.5–5.1)
Sodium: 139 mEq/L (ref 136–145)
Total Protein: 6.9 g/dL (ref 6.4–8.3)

## 2014-02-04 LAB — CBC WITH DIFFERENTIAL/PLATELET
BASO%: 0.3 % (ref 0.0–2.0)
Basophils Absolute: 0 10*3/uL (ref 0.0–0.1)
EOS%: 5.5 % (ref 0.0–7.0)
Eosinophils Absolute: 0.2 10*3/uL (ref 0.0–0.5)
HCT: 37.3 % (ref 34.8–46.6)
HGB: 12.9 g/dL (ref 11.6–15.9)
LYMPH%: 32.9 % (ref 14.0–49.7)
MCH: 31.9 pg (ref 25.1–34.0)
MCHC: 34.6 g/dL (ref 31.5–36.0)
MCV: 92.1 fL (ref 79.5–101.0)
MONO#: 0.5 10*3/uL (ref 0.1–0.9)
MONO%: 12.4 % (ref 0.0–14.0)
NEUT#: 1.9 10*3/uL (ref 1.5–6.5)
NEUT%: 48.9 % (ref 38.4–76.8)
PLATELETS: 174 10*3/uL (ref 145–400)
RBC: 4.05 10*6/uL (ref 3.70–5.45)
RDW: 12.5 % (ref 11.2–14.5)
WBC: 3.8 10*3/uL — AB (ref 3.9–10.3)
lymph#: 1.3 10*3/uL (ref 0.9–3.3)

## 2014-02-04 LAB — LACTATE DEHYDROGENASE (CC13): LDH: 163 U/L (ref 125–245)

## 2014-02-04 LAB — SEDIMENTATION RATE: SED RATE: 11 mm/h (ref 0–22)

## 2014-02-05 ENCOUNTER — Telehealth: Payer: Self-pay | Admitting: *Deleted

## 2014-02-05 ENCOUNTER — Encounter: Payer: Self-pay | Admitting: *Deleted

## 2014-02-05 NOTE — Telephone Encounter (Signed)
Left message for patient to call back regarding scan results.  

## 2014-02-05 NOTE — Telephone Encounter (Signed)
Message copied by Norma Fredrickson on Wed Feb 05, 2014  3:30 PM ------      Message from: Ignacia Felling      Created: Wed Feb 05, 2014  3:23 PM                   ----- Message -----         From: Annia Belt, MD         Sent: 02/04/2014   6:28 PM           To: Ignacia Felling, RN, Jesse Fall, RN, #            Call pt: CT chest negative for Hodgkin's ------

## 2014-02-06 ENCOUNTER — Telehealth: Payer: Self-pay | Admitting: *Deleted

## 2014-02-06 NOTE — Telephone Encounter (Signed)
Message copied by Ignacia Felling on Thu Feb 06, 2014  1:12 PM ------      Message from: Norma Fredrickson      Created: Wed Feb 05, 2014  4:32 PM                   ----- Message -----         From: Ignacia Felling, RN         Sent: 02/05/2014   3:23 PM           To: Norma Fredrickson, LPN                        ----- Message -----         From: Annia Belt, MD         Sent: 02/04/2014   6:28 PM           To: Ignacia Felling, RN, Jesse Fall, RN, #            Call pt: CT chest negative for Hodgkin's ------

## 2014-02-06 NOTE — Telephone Encounter (Signed)
Spoke with patient.  Let her know that CT of chest is negative for Hodgkins.  She appreciated the call and will see [place] tomorrow.

## 2014-02-07 ENCOUNTER — Telehealth: Payer: Self-pay | Admitting: Oncology

## 2014-02-07 ENCOUNTER — Ambulatory Visit (HOSPITAL_BASED_OUTPATIENT_CLINIC_OR_DEPARTMENT_OTHER): Payer: BC Managed Care – PPO | Admitting: Nurse Practitioner

## 2014-02-07 VITALS — BP 163/111 | HR 68 | Temp 98.0°F | Resp 20 | Ht 67.0 in | Wt 235.6 lb

## 2014-02-07 DIAGNOSIS — I10 Essential (primary) hypertension: Secondary | ICD-10-CM

## 2014-02-07 DIAGNOSIS — C8114 Nodular sclerosis classical Hodgkin lymphoma, lymph nodes of axilla and upper limb: Secondary | ICD-10-CM

## 2014-02-07 DIAGNOSIS — C8190 Hodgkin lymphoma, unspecified, unspecified site: Secondary | ICD-10-CM

## 2014-02-07 DIAGNOSIS — E119 Type 2 diabetes mellitus without complications: Secondary | ICD-10-CM

## 2014-02-07 NOTE — Telephone Encounter (Signed)
gv adn printed appt sched and avs for pt for SEpt °

## 2014-02-07 NOTE — Progress Notes (Signed)
OFFICE PROGRESS NOTE  Interval history:   [name] is a 33 year old woman diagnosed with Hodgkin's lymphoma very 2013 when she presented with an enlarged left axillary lymph node, fevers and 20 pound weight loss. Biopsy 01/12/2012 showed nodular sclerosing Hodgkin's lymphoma. On initial physical exam she had bulky bilateral cervical, supraclavicular and axillary lymphadenopathy with massive adenopathy in the left axilla. There was no inguinal adenopathy no organomegaly. CT scans did not show any disease below the diaphragm. PET scan on 01/24/2012 showed intensely hypermetabolic cervical and supraclavicular lymphadenopathy as well as bulky left axillary lymphadenopathy; mild activity within the anterior mediastinum and lower paratracheal nodes as well as small right axillary nodes. There was no evidence of abnormal activity within the solid organs or activity below the hemidiaphragms. The spleen was normal. She completed ABVD chemotherapy x4 cycles 02/13/2012 through 05/12/2012 followed by involved field radiation 06/18/2012 through 07/16/2012. Course was complicated by bilateral interstitial pneumonia requiring hospitalization August 2013.  She presents today for scheduled followup. She reports she was recently diagnosed with asthma. She has intermittent shortness of breath which she attributes to asthma. She is utilizing nebulizers and inhalers. She denies any fevers or sweats. She has a good appetite. Her weight fluctuates. No bowel or bladder problems. No bruising or bleeding. For several months she has noted numbness/tingling involving the right hand. She denies any neck pain. No arm weakness. 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   Objective: Filed Vitals:   02/07/14 1501  BP: 163/111  Pulse: 68  Temp: 98 F (36.7 C)  Resp: 20   Oropharynx is without thrush or ulceration. No palpable cervical, supraclavicular, axillary or inguinal lymph nodes. Lungs  are clear. No wheezes or rales. Regular cardiac rhythm. Abdomen soft and nontender. No organomegaly. No leg edema. Arm and leg strength 5 over 5. Slight decrease in strength with the right grip.   Lab Results: Lab Results  Component Value Date   WBC 3.8* 02/04/2014   HGB 12.9 02/04/2014   HCT 37.3 02/04/2014   MCV 92.1 02/04/2014   PLT 174 02/04/2014   NEUTROABS 1.9 02/04/2014    Chemistry:    Chemistry      Component Value Date/Time   NA 139 02/04/2014 0806   NA 140 12/03/2013 0954   NA 139 05/28/2012 0818   K 4.4 02/04/2014 0806   K 4.3 12/03/2013 0954   K 4.6 05/28/2012 0818   CL 105 12/03/2013 0954   CL 106 03/28/2013 0954   CL 95* 05/28/2012 0818   CO2 24 02/04/2014 0806   CO2 27 12/03/2013 0954   CO2 28 05/28/2012 0818   BUN 19.1 02/04/2014 0806   BUN 11 12/03/2013 0954   BUN 11 05/28/2012 0818   CREATININE 0.9 02/04/2014 0806   CREATININE 0.79 12/03/2013 0954   CREATININE 0.77 04/15/2013 1230   CREATININE 0.67 10/08/2007 1250      Component Value Date/Time   CALCIUM 9.2 02/04/2014 0806   CALCIUM 8.7 12/03/2013 0954   CALCIUM 9.3 05/28/2012 0818   ALKPHOS 56 02/04/2014 0806   ALKPHOS 56 12/03/2013 0954   ALKPHOS 71 05/28/2012 0818   AST 12 02/04/2014 0806   AST 14 12/03/2013 0954   AST 24 05/28/2012 0818   ALT 18 02/04/2014 0806   ALT 16 12/03/2013 0954   ALT 25 05/28/2012 0818   BILITOT 0.36 02/04/2014 0806   BILITOT 0.5 12/03/2013 0954   BILITOT 0.60 05/28/2012 0818  Studies/Results: Ct Chest Wo Contrast  02/04/2014   CLINICAL DATA:  Hodgkin's lymphoma diagnosed February 2013, completed chemotherapy and radiation therapy.  EXAM: CT CHEST WITHOUT CONTRAST  TECHNIQUE: Multidetector CT imaging of the chest was performed following the standard protocol without IV contrast.  COMPARISON:  DG CHEST 2 VIEW dated 12/04/2013; CT CHEST W/CM dated 08/06/2013; NM PET IMAGE RESTAG (PS) SKULL BASE TO THIGH dated 06/04/2013  FINDINGS: Right-sided Port-A-Cath in place with tip in the mid SVC. Heart size is normal. No  pericardial or pleural effusion. Minimal soft tissue density in the anterior mediastinum could represent residual thymus. No significant change in 0.8 cm short axis low left cervical node at the level of the thyroid. Allowing for lack of IV contrast which decreases conspicuity of hilar and mediastinal lymphadenopathy, no new intrathoracic lymphadenopathy is identified. No axillary lymphadenopathy.  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 Patchy areas of reticular opacity predominantly in the superior segment right lower lobe image 20 and within a subpleural location in the superior segment left lower lobe image 26 are stable, likely also scarring. No new pulmonary mass, nodule, or consolidation.  No acute osseous abnormality or lytic or sclerotic osseous lesion.  IMPRESSION: No CT evidence for intrathoracic lymphoma recurrence. Stable posttreatment findings as described above.  At future exams, performance with IV contrast is recommended to improve sensitivity and specificity for detection of possible lymphadenopathy.   Electronically Signed   By: [name] M.D.   On: 02/04/2014 08:48    Medications: I have reviewed the patient's current medications.  Assessment/Plan: 1. Stage IIB nodular sclerosing Hodgkin's lymphoma with specifics of diagnosis and treatment as outlined above. 2. History of bilateral interstitial pneumonia August 2013. 3. Mild leukopenia, persistent. 4. Hypertension. On multiple blood pressure medications. 5. Type 2 diabetes. 6. Asthma. 7. Numbness/tingling right hand.   Dispositon-she remains in remission from Hodgkin's lymphoma. She is now 2 years out from diagnosis. Per Dr. Azucena Freed office note dated 08/15/2013 CT scans year 3 will be done on a 6 month schedule, then annual chest x-ray and clinical exam subsequent to that unless otherwise determined based on new symptoms or physical findings.  She understands Dr. Beryle Beams is leaving this  practice. Her care will be transitioned to Dr. Alvy Bimler.   She will return for a followup visit in 6 months with labs and a chest CT one week prior. She will followup with her primary care provider regarding the numbness/tingling in the right hand as well as continued management of her blood pressure.  Plan reviewed with Dr. Beryle Beams.   Ned Card ANP/GNP-BC

## 2014-07-08 ENCOUNTER — Encounter: Payer: Self-pay | Admitting: Family Medicine

## 2014-07-08 ENCOUNTER — Ambulatory Visit (INDEPENDENT_AMBULATORY_CARE_PROVIDER_SITE_OTHER): Payer: BC Managed Care – PPO | Admitting: Family Medicine

## 2014-07-08 VITALS — BP 145/108 | HR 75 | Temp 98.7°F | Ht 67.0 in | Wt 242.5 lb

## 2014-07-08 DIAGNOSIS — E119 Type 2 diabetes mellitus without complications: Secondary | ICD-10-CM

## 2014-07-08 DIAGNOSIS — I10 Essential (primary) hypertension: Secondary | ICD-10-CM

## 2014-07-08 DIAGNOSIS — R5381 Other malaise: Secondary | ICD-10-CM

## 2014-07-08 DIAGNOSIS — Z23 Encounter for immunization: Secondary | ICD-10-CM | POA: Diagnosis not present

## 2014-07-08 DIAGNOSIS — Z452 Encounter for adjustment and management of vascular access device: Secondary | ICD-10-CM | POA: Diagnosis not present

## 2014-07-08 DIAGNOSIS — C8190 Hodgkin lymphoma, unspecified, unspecified site: Secondary | ICD-10-CM | POA: Diagnosis not present

## 2014-07-08 DIAGNOSIS — E669 Obesity, unspecified: Secondary | ICD-10-CM

## 2014-07-08 DIAGNOSIS — R5383 Other fatigue: Secondary | ICD-10-CM

## 2014-07-08 DIAGNOSIS — E039 Hypothyroidism, unspecified: Secondary | ICD-10-CM

## 2014-07-08 LAB — CBC
HEMATOCRIT: 38.2 % (ref 36.0–46.0)
HEMOGLOBIN: 13.5 g/dL (ref 12.0–15.0)
MCH: 32.1 pg (ref 26.0–34.0)
MCHC: 35.3 g/dL (ref 30.0–36.0)
MCV: 90.7 fL (ref 78.0–100.0)
Platelets: 228 10*3/uL (ref 150–400)
RBC: 4.21 MIL/uL (ref 3.87–5.11)
RDW: 13.2 % (ref 11.5–15.5)
WBC: 3.4 10*3/uL — AB (ref 4.0–10.5)

## 2014-07-08 LAB — BASIC METABOLIC PANEL
BUN: 15 mg/dL (ref 6–23)
CALCIUM: 9.2 mg/dL (ref 8.4–10.5)
CO2: 28 mEq/L (ref 19–32)
Chloride: 103 mEq/L (ref 96–112)
Creat: 0.81 mg/dL (ref 0.50–1.10)
Glucose, Bld: 165 mg/dL — ABNORMAL HIGH (ref 70–99)
POTASSIUM: 4.4 meq/L (ref 3.5–5.3)
SODIUM: 138 meq/L (ref 135–145)

## 2014-07-08 LAB — LIPID PANEL
CHOL/HDL RATIO: 2.5 ratio
CHOLESTEROL: 207 mg/dL — AB (ref 0–200)
HDL: 82 mg/dL (ref >39)
LDL Cholesterol: 109 mg/dL — ABNORMAL HIGH (ref 0–99)
Triglycerides: 79 mg/dL (ref <150)
VLDL: 16 mg/dL (ref 0–40)

## 2014-07-08 LAB — TSH: TSH: 12.362 u[IU]/mL — AB (ref 0.350–4.500)

## 2014-07-08 LAB — POCT GLYCOSYLATED HEMOGLOBIN (HGB A1C): Hemoglobin A1C: 6.9

## 2014-07-08 NOTE — Progress Notes (Signed)
Subjective:    [name] is a 33 y.o. female who presents to [facility] today for several issues:   - HTN:  Currently taking Lisinopril-HCTZ.  Supposed to be on Norvasc or Metoprolol one, but she is unsure if she is actually taking anything besides ACE-HCTZ combo.     No adverse effects from medication.  Not checking it regularly.  No HA, CP, dizziness, shortness of breath, palpitations, or LE swelling.   BP Readings from Last 3 Encounters:  07/08/14 145/108  02/07/14 163/111  01/10/14 162/92   - Diabetes:  Currently on only Metformin.   No adverse effects from medication.  No hypoglycemic events.  No paresthesia or peripheral nerve pain.  Measures blood sugars at home every:     Lab Results  Component Value Date   HGBA1C 6.9 07/08/2014   Fatigue:  Still with hot flashes, decreased libido, feeling "tired all the time." Not relieved with rest.  Working 9-10 hour days.  Sleeping 5-6 hours of sleep at night. Unable to motivate herself to exercise in AM, she was exercising in AM last month.  No periods from Mirena.    Obesity: Changed her diet:  Grilled chicken, light salads, smoothie for breakfast.  Switched to [employer] about 3 months ago.  Previously lots of fast food/eating on the go.  Exercising more but since last week due to fatigue.    ROS as above per HPI, otherwise neg.    The following portions of the patient's history were reviewed and updated as appropriate: allergies, current medications, past medical history, family and social history, and problem list. Patient is a nonsmoker.    PMH reviewed.  Past Medical History  Diagnosis Date  . Hypertension   . Complication of anesthesia SLOW TO WAKE UP AFTER C SECTION  . Benign essential HTN 01/19/2012  . Eczema 01/19/2012  . Muscle spasm of back 03/09/2012  . S/P chemotherapy, time since 4-12 weeks 05/11/2012    4 cycles of ABVD with neulasta support and zoladex  . Cough, persistent 07/27/2012  . Leukopenia 08/01/2012  . Asthma     seasonal, worse in spring. Uses inhaler during this time of year  . Migraine   . Hodgkin lymphoma 01/18/2012    left axilla & left neck dx'd 01/16/2012  . DM II (diabetes mellitus, type II), controlled 01/19/2012   Past Surgical History  Procedure Laterality Date  . Dilation and curettage of uterus    . Axillary lymph node biopsy  12/2011    left  . Portacath placement  01/25/2012    Procedure: INSERTION PORT-A-CATH;  Surgeon: Gayland Curry, MD,FACS;  Location: [facility];  Service: General;  Laterality: Right;  right subclavian  . Cesarean section  2008    Medications reviewed. Current Outpatient Prescriptions  Medication Sig Dispense Refill  . albuterol (PROVENTIL HFA;VENTOLIN HFA) 108 (90 BASE) MCG/ACT inhaler Inhale 2 puffs into the lungs every 6 (six) hours as needed for wheezing or shortness of breath.  1 Inhaler  0  . albuterol (PROVENTIL) (2.5 MG/3ML) 0.083% nebulizer solution Take 3 mLs (2.5 mg total) by nebulization every 6 (six) hours as needed for wheezing or shortness of breath.  150 mL  1  . amLODipine (NORVASC) 10 MG tablet Take 1 tablet (10 mg total) by mouth daily.  90 tablet  3  . cyclobenzaprine (FLEXERIL) 10 MG tablet Take 1 tablet (10 mg total) by mouth at bedtime as needed for muscle spasms.  30 tablet  0  . HYDROcodone-homatropine (  HYCODAN) 5-1.5 MG/5ML syrup Take 5 mLs by mouth every 8 (eight) hours as needed for cough.  120 mL  0  . hydrocortisone cream 1 % Apply 1 application topically 2 (two) times daily as needed for itching. For eczema      . lisinopril-hydrochlorothiazide (PRINZIDE,ZESTORETIC) 20-25 MG per tablet Take 1 tablet by mouth daily.  90 tablet  2  . metFORMIN (GLUCOPHAGE) 500 MG tablet Take 500 mg by mouth 2 (two) times daily with a meal.       . metoprolol succinate (TOPROL-XL) 100 MG 24 hr tablet Take 1 tablet (100 mg total) by mouth daily. Take with or immediately following a meal.  90 tablet  3  . metroNIDAZOLE (FLAGYL) 500 MG tablet Take 1 tablet (500  mg total) by mouth 2 (two) times daily.  14 tablet  0  . minocycline (MINOCIN,DYNACIN) 100 MG capsule Take 1 capsule (100 mg total) by mouth 2 (two) times daily. X 7 days  14 capsule  0  . Naproxen Sodium (ALEVE PO) Take 2 tablets by mouth as needed (back pain).       . predniSONE (DELTASONE) 50 MG tablet 1 tab po daily at breakfast with food  5 tablet  0  . traMADol (ULTRAM) 50 MG tablet Take 1 tablet (50 mg total) by mouth every 8 (eight) hours as needed for pain.  30 tablet  1  . traZODone (DESYREL) 50 MG tablet Take 0.5-1 tablets (25-50 mg total) by mouth at bedtime as needed for sleep.  30 tablet  3  . zolpidem (AMBIEN) 5 MG tablet Take 5-10 mg by mouth at bedtime as needed for sleep. For sleep.       No current facility-administered medications for this visit.     Objective:   Physical Exam BP 145/108  Pulse 75  Temp(Src) 98.7 F (37.1 C) (Oral)  Ht 5\' 7"  (1.702 m)  Wt 242 lb 8 oz (109.997 kg)  BMI 37.97 kg/m2 Gen:  Alert, cooperative patient who appears stated age in no acute distress.  Vital signs reviewed. HEENT: EOMI,  MMM Cardiac:  Regular rate and rhythm with minimal murmur noted upper sternal borders.   Pulm:  Clear to auscultation bilaterally with good air movement.  No wheezes or rales noted.   Abd:  Soft/nondistended/nontender.  Good bowel sounds throughout all four quadrants.  No masses noted.  Exts: Non edematous BL  LE, warm and well perfused.   No results found for this or any previous visit (from the past 72 hour(s)).

## 2014-07-08 NOTE — Patient Instructions (Addendum)
Call to let me know which blood pressure medicine you're taking.    Call Dr. Jenne Campus our nutritonist to set up an appointment.  She's great!    We've put in the referral for the surgeon.  You should hear something in a week or two from them.    I'll call with the blood test results in a couple of days once they return.  I encourage you to start back with exercise like you have been.  This can help kick-start your weight loss goals.    It was good to see you today.

## 2014-07-09 DIAGNOSIS — IMO0001 Reserved for inherently not codable concepts without codable children: Secondary | ICD-10-CM | POA: Insufficient documentation

## 2014-07-09 DIAGNOSIS — R5383 Other fatigue: Secondary | ICD-10-CM

## 2014-07-09 DIAGNOSIS — R5381 Other malaise: Secondary | ICD-10-CM | POA: Insufficient documentation

## 2014-07-09 DIAGNOSIS — E669 Obesity, unspecified: Secondary | ICD-10-CM | POA: Insufficient documentation

## 2014-07-09 LAB — HIV ANTIBODY (ROUTINE TESTING W REFLEX): HIV 1&2 Ab, 4th Generation: NONREACTIVE

## 2014-07-09 NOTE — Assessment & Plan Note (Signed)
Sounds like she's only taking ACE-HCTZ combo.  Uncertain compliance.  Asked her to call in with other med. Recommended restarting Norvasc, may need new script.

## 2014-07-09 NOTE — Assessment & Plan Note (Signed)
Needs TSH check. Subclinical hypothyroidism in past -- but now may have reverted to clinical with all of her symptoms.

## 2014-07-09 NOTE — Assessment & Plan Note (Signed)
See fatigue. Exercise/diet changes

## 2014-07-09 NOTE — Assessment & Plan Note (Signed)
A1C is creeping upwards.   Sounds like she has changed her diet well and had better exercise -- but also sounds like she went through period of poor eating, lack of exercise, etc in past several weeks.   Continue Metformin and repeat A1C in 3 months. Encouraged to restart exercise program and continue mindful eating.

## 2014-07-09 NOTE — Assessment & Plan Note (Signed)
Upon further questioning:  Close aunt and uncle died days apart about 2 months ago.  This started spiral of unhealthy eating and stopping exercise. Discussed normal grieving process. She declined any counseling.  States she had never considered grief might be contributing factor to weight gain/fatigue.  TSH today.  Encouraged to continue eating heathy and restart exercise.

## 2014-07-09 NOTE — Assessment & Plan Note (Signed)
Refer to surgery for Port-a-cath removal.

## 2014-07-14 ENCOUNTER — Telehealth: Payer: Self-pay | Admitting: Family Medicine

## 2014-07-14 NOTE — Telephone Encounter (Signed)
Left message.  Was going to relay mild hypothyroidism.  With symptoms, so will treat.  Patient to call back so we can discuss this finding before I send in prescription.

## 2014-07-15 NOTE — Telephone Encounter (Signed)
Tried calling again.  Had to leave voicmail.  If she returns call, forward to nursing staff:  Please tell her that her thyroid test indicates low thyroid.  I will plan to treat this.  Let me know if/when you talk with her and I will send in thyroid medicine.  Will need to recheck levels in 6 weeks.  Thanks,  JW

## 2014-07-15 NOTE — Telephone Encounter (Signed)
Pt returns Dr. Cindra Presume call. Unable to contact Dr. Mingo Amber in office. Pt is heading to work now and will get off at 4:30pm. Pt requesting callback anytime after 4:30pm.

## 2014-07-17 MED ORDER — LEVOTHYROXINE SODIUM 75 MCG PO TABS: 75.0000 ug | ORAL_TABLET | Freq: Every day | ORAL | Status: DC

## 2014-07-17 NOTE — Telephone Encounter (Signed)
Script sent  

## 2014-07-17 NOTE — Telephone Encounter (Signed)
Returned call to patient.  Informed of message from Dr. Walden--re:low thyroid.  Will have Dr. Mingo Amber send Rx to [facility].  Scheduled 6-week lab appt for 08/28/14 @ 10:00.  Burna Forts, BSN, RN-BC

## 2014-07-31 ENCOUNTER — Ambulatory Visit (HOSPITAL_COMMUNITY)
Admission: RE | Admit: 2014-07-31 | Discharge: 2014-07-31 | Disposition: A | Discharge status: Home / Self Care | Payer: BC Managed Care – PPO | Source: Ambulatory Visit | Attending: Nurse Practitioner | Admitting: Nurse Practitioner

## 2014-07-31 ENCOUNTER — Encounter (HOSPITAL_COMMUNITY): Payer: Self-pay

## 2014-07-31 ENCOUNTER — Other Ambulatory Visit (HOSPITAL_BASED_OUTPATIENT_CLINIC_OR_DEPARTMENT_OTHER): Payer: BC Managed Care – PPO

## 2014-07-31 DIAGNOSIS — Z9221 Personal history of antineoplastic chemotherapy: Secondary | ICD-10-CM | POA: Diagnosis not present

## 2014-07-31 DIAGNOSIS — I10 Essential (primary) hypertension: Secondary | ICD-10-CM | POA: Insufficient documentation

## 2014-07-31 DIAGNOSIS — Z923 Personal history of irradiation: Secondary | ICD-10-CM | POA: Insufficient documentation

## 2014-07-31 DIAGNOSIS — C8190 Hodgkin lymphoma, unspecified, unspecified site: Secondary | ICD-10-CM

## 2014-07-31 DIAGNOSIS — C8118 Nodular sclerosis classical Hodgkin lymphoma, lymph nodes of multiple sites: Secondary | ICD-10-CM

## 2014-07-31 DIAGNOSIS — I517 Cardiomegaly: Secondary | ICD-10-CM | POA: Insufficient documentation

## 2014-07-31 DIAGNOSIS — E119 Type 2 diabetes mellitus without complications: Secondary | ICD-10-CM | POA: Insufficient documentation

## 2014-07-31 DIAGNOSIS — R918 Other nonspecific abnormal finding of lung field: Secondary | ICD-10-CM | POA: Insufficient documentation

## 2014-07-31 LAB — CBC WITH DIFFERENTIAL/PLATELET
BASO%: 1.9 % (ref 0.0–2.0)
Basophils Absolute: 0.1 10*3/uL (ref 0.0–0.1)
EOS%: 7.3 % — AB (ref 0.0–7.0)
Eosinophils Absolute: 0.2 10*3/uL (ref 0.0–0.5)
HCT: 38.4 % (ref 34.8–46.6)
HGB: 13 g/dL (ref 11.6–15.9)
LYMPH#: 1 10*3/uL (ref 0.9–3.3)
LYMPH%: 31.4 % (ref 14.0–49.7)
MCH: 31.8 pg (ref 25.1–34.0)
MCHC: 33.7 g/dL (ref 31.5–36.0)
MCV: 94.2 fL (ref 79.5–101.0)
MONO#: 0.3 10*3/uL (ref 0.1–0.9)
MONO%: 10.1 % (ref 0.0–14.0)
NEUT#: 1.6 10*3/uL (ref 1.5–6.5)
NEUT%: 49.3 % (ref 38.4–76.8)
Platelets: 195 10*3/uL (ref 145–400)
RBC: 4.08 10*6/uL (ref 3.70–5.45)
RDW: 12.6 % (ref 11.2–14.5)
WBC: 3.3 10*3/uL — ABNORMAL LOW (ref 3.9–10.3)

## 2014-07-31 LAB — COMPREHENSIVE METABOLIC PANEL (CC13)
ALT: 22 U/L (ref 0–55)
AST: 13 U/L (ref 5–34)
Albumin: 3.7 g/dL (ref 3.5–5.0)
Alkaline Phosphatase: 60 U/L (ref 40–150)
Anion Gap: 7 mEq/L (ref 3–11)
BILIRUBIN TOTAL: 0.3 mg/dL (ref 0.20–1.20)
BUN: 14 mg/dL (ref 7.0–26.0)
CHLORIDE: 106 meq/L (ref 98–109)
CO2: 25 mEq/L (ref 22–29)
CREATININE: 0.9 mg/dL (ref 0.6–1.1)
Calcium: 8.9 mg/dL (ref 8.4–10.4)
Glucose: 165 mg/dl — ABNORMAL HIGH (ref 70–140)
Potassium: 4.3 mEq/L (ref 3.5–5.1)
Sodium: 139 mEq/L (ref 136–145)
Total Protein: 7.2 g/dL (ref 6.4–8.3)

## 2014-07-31 LAB — LACTATE DEHYDROGENASE (CC13): LDH: 172 U/L (ref 125–245)

## 2014-07-31 LAB — SEDIMENTATION RATE: Sed Rate: 17 mm/hr (ref 0–22)

## 2014-08-07 ENCOUNTER — Telehealth: Payer: Self-pay | Admitting: Hematology and Oncology

## 2014-08-07 ENCOUNTER — Encounter: Payer: Self-pay | Admitting: Hematology and Oncology

## 2014-08-07 ENCOUNTER — Ambulatory Visit (HOSPITAL_BASED_OUTPATIENT_CLINIC_OR_DEPARTMENT_OTHER): Payer: BC Managed Care – PPO | Admitting: Hematology and Oncology

## 2014-08-07 VITALS — BP 169/111 | HR 72 | Temp 98.4°F | Resp 20 | Ht 67.0 in | Wt 246.1 lb

## 2014-08-07 DIAGNOSIS — Z299 Encounter for prophylactic measures, unspecified: Secondary | ICD-10-CM

## 2014-08-07 DIAGNOSIS — R918 Other nonspecific abnormal finding of lung field: Secondary | ICD-10-CM

## 2014-08-07 DIAGNOSIS — C8118 Nodular sclerosis classical Hodgkin lymphoma, lymph nodes of multiple sites: Secondary | ICD-10-CM

## 2014-08-07 DIAGNOSIS — D72819 Decreased white blood cell count, unspecified: Secondary | ICD-10-CM

## 2014-08-07 DIAGNOSIS — C8190 Hodgkin lymphoma, unspecified, unspecified site: Secondary | ICD-10-CM

## 2014-08-07 HISTORY — DX: Encounter for prophylactic measures, unspecified: Z29.9

## 2014-08-07 NOTE — Assessment & Plan Note (Signed)
This is due to radiation induced fibrosis. She is not symptomatic although she does take inhaler regularly. Continue observation

## 2014-08-07 NOTE — Progress Notes (Signed)
[facility] FOLLOW-UP progress notes  Patient Care Team: Alveda Reasons, MD as PCP - General (Family Medicine)  CHIEF COMPLAINTS/PURPOSE OF VISIT:  Hodgkin's lymphoma  HISTORY OF PRESENTING ILLNESS:  [name] 33 y.o. female was transferred to my care after her prior physician has left.  I reviewed the patient's records extensive and collaborated the history with the patient. Summary of her history is as follows: Oncology History   Hodgkin's lymphoma   Primary site: Lymphoid Neoplasms (Left)   Staging method: AJCC 6th Edition   Clinical: Stage II   Summary: Stage II       Hodgkin's lymphoma   01/11/2012 Procedure [id number] Left axillary LN biopsy showed Hodgkin lymphoma   01/24/2012 Imaging Ct scan of chest, abdomen and pelvis & PET scan showed bulky left axillary LN, supraclavicular lymphadenoapthy and medistinal involvement   02/03/2012 - 05/11/2012 Chemotherapy She completed 4 cycles of ABVD   05/28/2012 Imaging PET scan showed partial response   06/18/2012 - 07/16/2012 Radiation Therapy She completed radiation treatment   07/27/2012 Imaging CT scan of chest showed pneumonia   03/28/2013 Imaging Ct chest showed no recurrence   04/15/2013 Imaging Ct angiogram showed pneumonia   06/04/2013 Imaging PET scan showed complete response   08/06/2013 Imaging Ct scan showed no recurrence   02/04/2014 Imaging Ct chest showed no recurrence   07/31/2014 Imaging Ct chest showed no recurrence   She denies recent new lymphadenopathy. She has chronic dyspnea related to her asthma and she uses inhalers regularly. She has mild menopausal symptoms with occasional hot flashes. She has no recent menstruation but she attributed that to [place] IUD. She denies any recent abnormal breast examination, palpable mass, abnormal breast appearance or nipple changes  MEDICAL HISTORY:  Past Medical History  Diagnosis Date  . Hypertension   . Complication of anesthesia SLOW TO WAKE UP AFTER C SECTION   . Benign essential HTN 01/19/2012  . Eczema 01/19/2012  . Muscle spasm of back 03/09/2012  . S/P chemotherapy, time since 4-12 weeks 05/11/2012    4 cycles of ABVD with neulasta support and zoladex  . Cough, persistent 07/27/2012  . Leukopenia 08/01/2012  . Asthma     seasonal, worse in spring. Uses inhaler during this time of year  . Migraine   . DM II (diabetes mellitus, type II), controlled 01/19/2012  . Hodgkin lymphoma 01/18/2012    left axilla & left neck dx'd 01/16/2012    SURGICAL HISTORY: Past Surgical History  Procedure Laterality Date  . Dilation and curettage of uterus    . Axillary lymph node biopsy  12/2011    left  . Portacath placement  01/25/2012    Procedure: INSERTION PORT-A-CATH;  Surgeon: Gayland Curry, MD,FACS;  Location: [facility];  Service: General;  Laterality: Right;  right subclavian  . Cesarean section  2008    SOCIAL HISTORY: History   Social History  . Marital Status: Single    Spouse Name: N/A    Number of Children: N/A  . Years of Education: N/A   Occupational History  . Not on file.   Social History Main Topics  . Smoking status: Former Smoker    Quit date: 11/19/2006  . Smokeless tobacco: Never Used  . Alcohol Use: No  . Drug Use: No  . Sexual Activity: Yes    Birth Control/ Protection: IUD   Other Topics Concern  . Not on file   Social History Narrative  . No narrative on file  FAMILY HISTORY: Family History  Problem Relation Age of Onset  . Diabetes Maternal Grandmother   . Heart disease Maternal Grandfather   . Cancer Neg Hx     ALLERGIES:  is allergic to imitrex and penicillins.  MEDICATIONS:  Current Outpatient Prescriptions  Medication Sig Dispense Refill  . albuterol (PROVENTIL HFA;VENTOLIN HFA) 108 (90 BASE) MCG/ACT inhaler Inhale 2 puffs into the lungs every 6 (six) hours as needed for wheezing or shortness of breath.  1 Inhaler  0  . albuterol (PROVENTIL) (2.5 MG/3ML) 0.083% nebulizer solution Take 3 mLs (2.5 mg  total) by nebulization every 6 (six) hours as needed for wheezing or shortness of breath.  150 mL  1  . amLODipine (NORVASC) 10 MG tablet Take 1 tablet (10 mg total) by mouth daily.  90 tablet  3  . levothyroxine (SYNTHROID, LEVOTHROID) 75 MCG tablet Take 1 tablet (75 mcg total) by mouth daily.  30 tablet  1  . lisinopril-hydrochlorothiazide (PRINZIDE,ZESTORETIC) 20-25 MG per tablet Take 1 tablet by mouth daily.  90 tablet  2  . metFORMIN (GLUCOPHAGE) 500 MG tablet Take 500 mg by mouth 2 (two) times daily with a meal.       . metoprolol succinate (TOPROL-XL) 100 MG 24 hr tablet Take 1 tablet (100 mg total) by mouth daily. Take with or immediately following a meal.  90 tablet  3  . traZODone (DESYREL) 50 MG tablet Take 0.5-1 tablets (25-50 mg total) by mouth at bedtime as needed for sleep.  30 tablet  3   No current facility-administered medications for this visit.    REVIEW OF SYSTEMS:   Constitutional: Denies fevers, chills or abnormal night sweats Eyes: Denies blurriness of vision, double vision or watery eyes Ears, nose, mouth, throat, and face: Denies mucositis or sore throat Respiratory: Denies cough, dyspnea or wheezes Cardiovascular: Denies palpitation, chest discomfort or lower extremity swelling Gastrointestinal:  Denies nausea, heartburn or change in bowel habits Skin: Denies abnormal skin rashes Lymphatics: Denies new lymphadenopathy or easy bruising Neurological:Denies numbness, tingling or new weaknesses Behavioral/Psych: Mood is stable, no new changes  All other systems were reviewed with the patient and are negative.  PHYSICAL EXAMINATION: ECOG PERFORMANCE STATUS: 0 - Asymptomatic  Filed Vitals:   08/07/14 0910  BP: 169/111  Pulse: 72  Temp: 98.4 F (36.9 C)  Resp: 20   Filed Weights   08/07/14 0910  Weight: 246 lb 1.6 oz (111.63 kg)    GENERAL:alert, no distress and comfortable. She is morbidly obese SKIN: skin color, texture, turgor are normal, no rashes or  significant lesions EYES: normal, conjunctiva are pink and non-injected, sclera clear OROPHARYNX:no exudate, normal lips, buccal mucosa, and tongue  NECK: supple, thyroid normal size, non-tender, without nodularity LYMPH:  no palpable lymphadenopathy in the cervical, axillary or inguinal LUNGS: clear to auscultation and percussion with normal breathing effort HEART: regular rate & rhythm and no murmurs without lower extremity edema ABDOMEN:abdomen soft, non-tender and normal bowel sounds Musculoskeletal:no cyanosis of digits and no clubbing  PSYCH: alert & oriented x 3 with fluent speech NEURO: no focal motor/sensory deficits  LABORATORY DATA:  I have reviewed the data as listed Lab Results  Component Value Date   WBC 3.3* 07/31/2014   HGB 13.0 07/31/2014   HCT 38.4 07/31/2014   MCV 94.2 07/31/2014   PLT 195 07/31/2014    Recent Labs  09/18/13 1636 12/03/13 0954 02/04/14 0806 07/08/14 1046 07/31/14 0905  NA 135 140 139 138 139  K 4.1 4.3  4.4 4.4 4.3  CL 100 105  --  103  --   CO2 28 27 24 28 25   GLUCOSE 156* 124* 138 165* 165*  BUN 14 11 19.1 15 14.0  CREATININE 0.91 0.79 0.9 0.81 0.9  CALCIUM 9.4 8.7 9.2 9.2 8.9  PROT  --  6.7 6.9  --  7.2  ALBUMIN  --  4.2 3.7  --  3.7  AST  --  14 12  --  13  ALT  --  16 18  --  22  ALKPHOS  --  56 56  --  60  BILITOT  --  0.5 0.36  --  0.30    RADIOGRAPHIC STUDIES: I have personally reviewed the radiological images as listed and agreed with the findings in the report. Ct Chest Wo Contrast  07/31/2014   CLINICAL DATA:  History of Hodgkin's lymphoma diagnosed 2/13 with chemotherapy and radiation therapy. Hypertension. Diabetes.  EXAM: CT CHEST WITHOUT CONTRAST  TECHNIQUE: Multidetector CT imaging of the chest was performed following the standard protocol without IV contrast.  COMPARISON:  02/04/2014  FINDINGS: Lungs/Pleura: Volume loss in the left hemi thorax with presumed radiation changes in the paramediastinal left upper and left lower  lobes. Scattered similar areas of interstitial thickening within both lower lobes, favored to represent areas of scarring.  No pleural fluid.  Heart/Mediastinum: A low left jugular node which measures 7 mm, similar to 8 mm on the prior. A right-sided Port-A-Cath which terminates at the low SVC.  No axillary adenopathy. A bovine arch. Mild cardiomegaly. No mediastinal or definite hilar adenopathy, given limitations of unenhanced CT.  Upper Abdomen: Normal imaged portions of the liver, spleen, stomach, pancreas, gallbladder, biliary tract, adrenal glands, kidneys. No upper abdominal adenopathy.  Bones/Musculoskeletal:  No acute osseous abnormality.  IMPRESSION: 1. No evidence of recurrent or residual lymphoma. 2. Similar radiation changes within the paramediastinal left lung.   Electronically Signed   By: Abigail Miyamoto M.D.   On: 07/31/2014 09:55    ASSESSMENT & PLAN:  Hodgkin's lymphoma She has completed all treatment and recent imaging showed no recurrence. I recommend port removal. I will see her in 6 months with repeat labs, CXR and examination  Leukopenia This is likely due to prior treatment. The patient denies recent history of fevers, cough, chills, diarrhea or dysuria. She is asymptomatic from the leukopenia. I will observe for now.    Pulmonary infiltrates This is due to radiation induced fibrosis. She is not symptomatic although she does take inhaler regularly. Continue observation  Preventive measure We discussed the importance of preventive care and reviewed the vaccination programs. She does not have any prior allergic reactions to influenza vaccination. She agrees to proceed with influenza vaccination and we will obtain through her PCP. Due to exposure to radiation, she would need early screening mammogram due to high risk development of breast cancer. I will order mammogram to start next year to be done prior to her return visit   Orders Placed This Encounter  Procedures  . IR  Removal Tun Access W/ Port W/O FL    Standing Status: Future     Number of Occurrences:      Standing Expiration Date: 10/08/2015    Order Specific Question:  Reason for exam:    Answer:  patient request radiologist to do it instead of general surgery due to insurance issue    Order Specific Question:  Is the patient pregnant?    Answer:  No  Order Specific Question:  Preferred Imaging Location?    Answer:  [facility]    EPIC ORDER PF 01/11/2012 [facility]   PT HAS HODGKIN'S LYMPHOMA    NP/SHAMEIKA   [employer]  2D      Standing Status: Future     Number of Occurrences:      Standing Expiration Date: 10/07/2015    Order Specific Question:  Reason for Exam (SYMPTOM  OR DIAGNOSIS REQUIRED)    Answer:  early screening mammo due to exposure to XRT    Order Specific Question:  Is the patient pregnant?    Answer:  No    Order Specific Question:  Preferred imaging location?    Answer:  [facility]  . DG Chest 2 View    Standing Status: Future     Number of Occurrences:      Standing Expiration Date: 09/11/2015    Order Specific Question:  Reason for exam:    Answer:  hodgkin lymphoma    Order Specific Question:  Preferred imaging location?    Answer:  [facility]  . Sedimentation rate    Standing Status: Future     Number of Occurrences:      Standing Expiration Date: 09/11/2015  . Comprehensive metabolic panel    Standing Status: Future     Number of Occurrences:      Standing Expiration Date: 09/11/2015  . CBC with Differential    Standing Status: Future     Number of Occurrences:      Standing Expiration Date: 09/11/2015    All questions were answered. The patient knows to call the clinic with any problems, questions or concerns. I spent 30 minutes counseling the patient face to face. The total time spent in the appointment was 40 minutes and more than 50% was on counseling.     [facility], Anelly Samarin, MD 08/07/2014 5:44 PM

## 2014-08-07 NOTE — Assessment & Plan Note (Signed)
She has completed all treatment and recent imaging showed no recurrence. I recommend port removal. I will see her in 6 months with repeat labs, CXR and examination

## 2014-08-07 NOTE — Assessment & Plan Note (Signed)
We discussed the importance of preventive care and reviewed the vaccination programs. She does not have any prior allergic reactions to influenza vaccination. She agrees to proceed with influenza vaccination and we will obtain through her PCP. Due to exposure to radiation, she would need early screening mammogram due to high risk development of breast cancer. I will order mammogram to start next year to be done prior to her return visit

## 2014-08-07 NOTE — Telephone Encounter (Signed)
gv adn printed appt sched and avs for opt for Sept adn March 2016

## 2014-08-07 NOTE — Assessment & Plan Note (Signed)
This is likely due to prior treatment. The patient denies recent history of fevers, cough, chills, diarrhea or dysuria. She is asymptomatic from the leukopenia. I will observe for now.   

## 2014-08-18 ENCOUNTER — Other Ambulatory Visit: Payer: Self-pay | Admitting: Radiology

## 2014-08-21 ENCOUNTER — Ambulatory Visit (HOSPITAL_COMMUNITY)
Admission: RE | Admit: 2014-08-21 | Discharge: 2014-08-21 | Disposition: A | Discharge status: Home / Self Care | Payer: BC Managed Care – PPO | Source: Ambulatory Visit | Attending: Hematology and Oncology | Admitting: Hematology and Oncology

## 2014-08-21 ENCOUNTER — Encounter (HOSPITAL_COMMUNITY): Payer: Self-pay

## 2014-08-21 VITALS — BP 153/97 | HR 69 | Temp 97.7°F | Resp 18

## 2014-08-21 DIAGNOSIS — C8190 Hodgkin lymphoma, unspecified, unspecified site: Secondary | ICD-10-CM | POA: Diagnosis present

## 2014-08-21 DIAGNOSIS — Z9221 Personal history of antineoplastic chemotherapy: Secondary | ICD-10-CM | POA: Insufficient documentation

## 2014-08-21 HISTORY — DX: Hypothyroidism, unspecified: E03.9

## 2014-08-21 LAB — PROTIME-INR
INR: 1.08 (ref 0.00–1.49)
PROTHROMBIN TIME: 14 s (ref 11.6–15.2)

## 2014-08-21 LAB — CBC WITH DIFFERENTIAL/PLATELET
BASOS ABS: 0 10*3/uL (ref 0.0–0.1)
BASOS PCT: 1 % (ref 0–1)
Eosinophils Absolute: 0.2 10*3/uL (ref 0.0–0.7)
Eosinophils Relative: 6 % — ABNORMAL HIGH (ref 0–5)
HCT: 37.7 % (ref 36.0–46.0)
HEMOGLOBIN: 13.4 g/dL (ref 12.0–15.0)
LYMPHS PCT: 29 % (ref 12–46)
Lymphs Abs: 1.1 10*3/uL (ref 0.7–4.0)
MCH: 31.8 pg (ref 26.0–34.0)
MCHC: 35.5 g/dL (ref 30.0–36.0)
MCV: 89.5 fL (ref 78.0–100.0)
MONO ABS: 0.3 10*3/uL (ref 0.1–1.0)
Monocytes Relative: 9 % (ref 3–12)
NEUTROS ABS: 2.1 10*3/uL (ref 1.7–7.7)
NEUTROS PCT: 55 % (ref 43–77)
Platelets: 206 10*3/uL (ref 150–400)
RBC: 4.21 MIL/uL (ref 3.87–5.11)
RDW: 12.3 % (ref 11.5–15.5)
WBC: 3.8 10*3/uL — ABNORMAL LOW (ref 4.0–10.5)

## 2014-08-21 LAB — GLUCOSE, CAPILLARY: GLUCOSE-CAPILLARY: 153 mg/dL — AB (ref 70–99)

## 2014-08-21 LAB — APTT: APTT: 30 s (ref 24–37)

## 2014-08-21 MED ORDER — LIDOCAINE-EPINEPHRINE (PF) 2 %-1:200000 IJ SOLN
INTRAMUSCULAR | Status: AC
  Filled 2014-08-21: qty 20

## 2014-08-21 MED ORDER — VANCOMYCIN HCL 10 G IV SOLR
1500.0000 mg | INTRAVENOUS | Status: AC
  Administered 2014-08-21: 1500 mg via INTRAVENOUS
  Filled 2014-08-21: qty 1500

## 2014-08-21 MED ORDER — FENTANYL CITRATE 0.05 MG/ML IJ SOLN
INTRAMUSCULAR | Status: AC | PRN
  Administered 2014-08-21 (×2): 50 ug via INTRAVENOUS

## 2014-08-21 MED ORDER — SODIUM CHLORIDE 0.9 % IV SOLN
INTRAVENOUS | Status: DC
  Administered 2014-08-21: 10:00:00 via INTRAVENOUS

## 2014-08-21 MED ORDER — MIDAZOLAM HCL 2 MG/2ML IJ SOLN
INTRAMUSCULAR | Status: AC
  Filled 2014-08-21: qty 6

## 2014-08-21 MED ORDER — FENTANYL CITRATE 0.05 MG/ML IJ SOLN
INTRAMUSCULAR | Status: AC
  Filled 2014-08-21: qty 6

## 2014-08-21 MED ORDER — MIDAZOLAM HCL 2 MG/2ML IJ SOLN
INTRAMUSCULAR | Status: AC | PRN
  Administered 2014-08-21: 0.5 mg via INTRAVENOUS
  Administered 2014-08-21 (×2): 1 mg via INTRAVENOUS

## 2014-08-21 MED ORDER — LIDOCAINE HCL 1 % IJ SOLN
INTRAMUSCULAR | Status: AC
  Filled 2014-08-21: qty 20

## 2014-08-21 NOTE — Discharge Instructions (Signed)
Incision Care ° An incision is a surgical cut to open your skin. You need to take care of your incision. This helps you to not get an infection. °HOME CARE °· Only take medicine as told by your doctor. °· Do not take off your bandage (dressing) or get your incision wet until your doctor approves. Change the bandage and call your doctor if the bandage gets wet, dirty, or starts to smell. °· Take showers. Do not take baths, swim, or do anything that may soak your incision until it heals. °· Return to your normal diet and activities as told or allowed by your doctor. °· Avoid lifting any weight until your doctor approves. °· Put medicine that helps lessen itching on your incision as told by your doctor. Do not pick or scratch at your incision. °· Keep your doctor visit to have your stitches (sutures) or staples removed. °· Drink enough fluids to keep your pee (urine) clear or pale yellow. °GET HELP RIGHT AWAY IF: °· You have a fever. °· You have a rash. °· You are dizzy, or you pass out (faint) while standing. °· You have trouble breathing. °· You have a reaction or side effects to medicine given to you. °· You have redness, puffiness (swelling), or more pain in the incision and medicine does not help. °· You have fluid, blood, or yellowish-white fluid (pus) coming from the incision lasting over 1 day. °· You have muscle aches, chills, or you feel sick. °· You have a bad smell coming from the incision or bandage. °· Your incision opens up after stitches, staples, or sticky strips have been removed. °· You keep feeling sick to your stomach (nauseous) or keep throwing up (vomiting). °MAKE SURE YOU:  °· Understand these instructions. °· Will watch your condition. °· Will get help right away if you are not doing well or get worse. °Document Released: 02/06/2012 Document Reviewed: 01/08/2014 °[employer]® Patient Information ©2015 [employer]. This information is not intended to replace advice given to you by your health  care provider. Make sure you discuss any questions you have with your health care provider. °Conscious Sedation, Adult, Care After °Refer to this sheet in the next few weeks. These instructions provide you with information on caring for yourself after your procedure. Your health care provider may also give you more specific instructions. Your treatment has been planned according to current medical practices, but problems sometimes occur. Call your health care provider if you have any problems or questions after your procedure. °WHAT TO EXPECT AFTER THE PROCEDURE  °After your procedure: °· You may feel sleepy, clumsy, and have poor balance for several hours. °· Vomiting may occur if you eat too soon after the procedure. °HOME CARE INSTRUCTIONS °· Do not participate in any activities where you could become injured for at least 24 hours. Do not: °· Drive. °· Swim. °· Ride a bicycle. °· Operate heavy machinery. °· Cook. °· Use power tools. °· Climb ladders. °· Work from a high place. °· Do not make important decisions or sign legal documents until you are improved. °· If you vomit, drink water, juice, or soup when you can drink without vomiting. Make sure you have little or no nausea before eating solid foods. °· Only take over-the-counter or prescription medicines for pain, discomfort, or fever as directed by your health care provider. °· Make sure you and your family fully understand everything about the medicines given to you, including what side effects may occur. °· You should   not drink alcohol, take sleeping pills, or take medicines that cause drowsiness for at least 24 hours. °· If you smoke, do not smoke without supervision. °· If you are feeling better, you may resume normal activities 24 hours after you were sedated. °· Keep all appointments with your health care provider. °SEEK MEDICAL CARE IF: °· Your skin is pale or bluish in color. °· You continue to feel nauseous or vomit. °· Your pain is getting worse  and is not helped by medicine. °· You have bleeding or swelling. °· You are still sleepy or feeling clumsy after 24 hours. °SEEK IMMEDIATE MEDICAL CARE IF: °· You develop a rash. °· You have difficulty breathing. °· You develop any type of allergic problem. °· You have a fever. °MAKE SURE YOU: °· Understand these instructions. °· Will watch your condition. °· Will get help right away if you are not doing well or get worse. °Document Released: 09/04/2013 Document Reviewed: 09/04/2013 °[employer]® Patient Information ©2015 [employer]. This information is not intended to replace advice given to you by your health care provider. Make sure you discuss any questions you have with your health care provider. ° °

## 2014-08-21 NOTE — Procedures (Signed)
Successful RT SUBCLAVIAN PORT REMOVAL  No comp Stable Full report in PACS

## 2014-08-21 NOTE — H&P (Signed)
Chief Complaint: Hodgkin's Lymphoma treatment complete Scheduled for PAC removal per Dr Alvy Bimler  Referring Physician(s): Heath Lark  History of Present Illness: [name] is a 33 y.o. female  Dx Hodgkins Lymphoma 2013 Port  A cath placed in OR at that time Last treatment Oct 2014 Has not used or flushed since that time. Request now for removal  Past Medical History  Diagnosis Date  . Hypertension   . Complication of anesthesia SLOW TO WAKE UP AFTER C SECTION  . Benign essential HTN 01/19/2012  . Eczema 01/19/2012  . Muscle spasm of back 03/09/2012  . S/P chemotherapy, time since 4-12 weeks 05/11/2012    4 cycles of ABVD with neulasta support and zoladex  . Cough, persistent 07/27/2012  . Leukopenia 08/01/2012  . Asthma     seasonal, worse in spring. Uses inhaler during this time of year  . Migraine   . DM II (diabetes mellitus, type II), controlled 01/19/2012  . Hodgkin lymphoma 01/18/2012    left axilla & left neck dx'd 01/16/2012    Past Surgical History  Procedure Laterality Date  . Dilation and curettage of uterus    . Axillary lymph node biopsy  12/2011    left  . Portacath placement  01/25/2012    Procedure: INSERTION PORT-A-CATH;  Surgeon: Gayland Curry, MD,FACS;  Location: [facility];  Service: General;  Laterality: Right;  right subclavian  . Cesarean section  2008    Allergies: Imitrex and Penicillins  Medications: Prior to Admission medications   Medication Sig Start Date End Date Taking? Authorizing Provider  albuterol (PROVENTIL HFA;VENTOLIN HFA) 108 (90 BASE) MCG/ACT inhaler Inhale 2 puffs into the lungs every 6 (six) hours as needed for wheezing or shortness of breath. 12/06/13   Alveda Reasons, MD  albuterol (PROVENTIL) (2.5 MG/3ML) 0.083% nebulizer solution Take 3 mLs (2.5 mg total) by nebulization every 6 (six) hours as needed for wheezing or shortness of breath. 12/06/13   Alveda Reasons, MD  amLODipine (NORVASC) 10 MG tablet Take 1 tablet (10 mg  total) by mouth daily. 09/20/13   Angelica Ran, MD  levothyroxine (SYNTHROID, LEVOTHROID) 75 MCG tablet Take 1 tablet (75 mcg total) by mouth daily. 07/17/14   Alveda Reasons, MD  lisinopril-hydrochlorothiazide (PRINZIDE,ZESTORETIC) 20-25 MG per tablet Take 1 tablet by mouth daily. 02/28/13   Alveda Reasons, MD  metFORMIN (GLUCOPHAGE) 500 MG tablet Take 500 mg by mouth 2 (two) times daily with a meal.     Historical Provider, MD  metoprolol succinate (TOPROL-XL) 100 MG 24 hr tablet Take 1 tablet (100 mg total) by mouth daily. Take with or immediately following a meal. 01/10/14   Alveda Reasons, MD  traZODone (DESYREL) 50 MG tablet Take 0.5-1 tablets (25-50 mg total) by mouth at bedtime as needed for sleep. 10/09/13   Angelica Ran, MD    Family History  Problem Relation Age of Onset  . Diabetes Maternal Grandmother   . Heart disease Maternal Grandfather   . Cancer Neg Hx     History   Social History  . Marital Status: Single    Spouse Name: N/A    Number of Children: N/A  . Years of Education: N/A   Social History Main Topics  . Smoking status: Former Smoker    Quit date: 11/19/2006  . Smokeless tobacco: Never Used  . Alcohol Use: No  . Drug Use: No  . Sexual Activity: Yes    Birth Control/ Protection: IUD  Other Topics Concern  . Not on file   Social History Narrative  . No narrative on file    Review of Systems: A 12 point ROS discussed and pertinent positives are indicated in the HPI above.  All other systems are negative.  Review of Systems  Constitutional: Negative for fever, appetite change, fatigue and unexpected weight change.  Respiratory: Negative for cough, chest tightness and shortness of breath.   Cardiovascular: Negative for chest pain.  Gastrointestinal: Negative for abdominal pain.  Skin: Negative for color change.  Psychiatric/Behavioral: Negative for confusion and agitation.    Vital Signs: There were no vitals taken for this  visit.  Physical Exam  Constitutional: She is oriented to person, place, and time. She appears well-nourished.  Cardiovascular: Normal rate, regular rhythm and normal heart sounds.   Pulmonary/Chest: Effort normal and breath sounds normal. No respiratory distress. She has no wheezes.  Abdominal: Soft. Bowel sounds are normal. There is no tenderness.  Musculoskeletal: Normal range of motion.  Neurological: She is alert and oriented to person, place, and time.  Skin: Skin is warm and dry.  Psychiatric: She has a normal mood and affect. Her behavior is normal. Judgment and thought content normal.    Imaging: Ct Chest Wo Contrast  07/31/2014   CLINICAL DATA:  History of Hodgkin's lymphoma diagnosed 2/13 with chemotherapy and radiation therapy. Hypertension. Diabetes.  EXAM: CT CHEST WITHOUT CONTRAST  TECHNIQUE: Multidetector CT imaging of the chest was performed following the standard protocol without IV contrast.  COMPARISON:  02/04/2014  FINDINGS: Lungs/Pleura: Volume loss in the left hemi thorax with presumed radiation changes in the paramediastinal left upper and left lower lobes. Scattered similar areas of interstitial thickening within both lower lobes, favored to represent areas of scarring.  No pleural fluid.  Heart/Mediastinum: A low left jugular node which measures 7 mm, similar to 8 mm on the prior. A right-sided Port-A-Cath which terminates at the low SVC.  No axillary adenopathy. A bovine arch. Mild cardiomegaly. No mediastinal or definite hilar adenopathy, given limitations of unenhanced CT.  Upper Abdomen: Normal imaged portions of the liver, spleen, stomach, pancreas, gallbladder, biliary tract, adrenal glands, kidneys. No upper abdominal adenopathy.  Bones/Musculoskeletal:  No acute osseous abnormality.  IMPRESSION: 1. No evidence of recurrent or residual lymphoma. 2. Similar radiation changes within the paramediastinal left lung.   Electronically Signed   By: Abigail Miyamoto M.D.   On:  07/31/2014 09:55    Labs: Lab Results  Component Value Date   WBC 3.8* 08/21/2014   HCT 37.7 08/21/2014   MCV 89.5 08/21/2014   PLT 206 08/21/2014   NA 139 07/31/2014   K 4.3 07/31/2014   CL 103 07/08/2014   CO2 25 07/31/2014   GLUCOSE 165* 07/31/2014   BUN 14.0 07/31/2014   CREATININE 0.9 07/31/2014   CALCIUM 8.9 07/31/2014   PROT 7.2 07/31/2014   ALBUMIN 3.7 07/31/2014   AST 13 07/31/2014   ALT 22 07/31/2014   ALKPHOS 60 07/31/2014   BILITOT 0.30 07/31/2014   GFRNONAA >90 04/15/2013   GFRAA >90 04/15/2013   INR 1.13 01/18/2012    Assessment and Plan:  Hodgkin's Lymphoma dx 2013 PAC placed in OR then Last tx 08/2013 Now for removal- treatment complete  Thank you for this interesting consult.  I greatly enjoyed meeting [name] and look forward to participating in their care.     I spent a total of 20 minutes face to face in clinical consultation, greater than 50%  of which was counseling/coordinating care for [facility] a cath removal  Signed: Sigfredo Schreier A 08/21/2014, 10:36 AM

## 2014-08-28 ENCOUNTER — Other Ambulatory Visit: Payer: BC Managed Care – PPO

## 2014-08-28 DIAGNOSIS — E039 Hypothyroidism, unspecified: Secondary | ICD-10-CM

## 2014-08-28 NOTE — Progress Notes (Signed)
TSH DONE TODAY [name] 

## 2014-08-29 LAB — TSH: TSH: 11.241 u[IU]/mL — ABNORMAL HIGH (ref 0.350–4.500)

## 2014-09-04 ENCOUNTER — Encounter: Payer: Self-pay | Admitting: Family Medicine

## 2014-09-04 ENCOUNTER — Ambulatory Visit (INDEPENDENT_AMBULATORY_CARE_PROVIDER_SITE_OTHER): Payer: Medicaid Other | Admitting: Family Medicine

## 2014-09-04 VITALS — BP 180/106 | HR 74 | Temp 99.0°F | Resp 20 | Wt 235.0 lb

## 2014-09-04 DIAGNOSIS — G43911 Migraine, unspecified, intractable, with status migrainosus: Secondary | ICD-10-CM | POA: Diagnosis present

## 2014-09-04 DIAGNOSIS — R0602 Shortness of breath: Secondary | ICD-10-CM

## 2014-09-04 MED ORDER — KETOROLAC TROMETHAMINE 30 MG/ML IJ SOLN
30.0000 mg | Freq: Once | INTRAMUSCULAR | Status: AC
  Administered 2014-09-04: 30 mg via INTRAMUSCULAR

## 2014-09-04 NOTE — Patient Instructions (Signed)
It was nice to see you today.  Continue using the albuterol as needed.    Please schedule an appointment with Dr. Valentina Lucks for spirometry (testing your lungs).  If you worsen, please let us know.

## 2014-09-05 ENCOUNTER — Telehealth: Payer: Self-pay | Admitting: Family Medicine

## 2014-09-05 DIAGNOSIS — Z923 Personal history of irradiation: Secondary | ICD-10-CM

## 2014-09-05 DIAGNOSIS — R0600 Dyspnea, unspecified: Secondary | ICD-10-CM

## 2014-09-05 DIAGNOSIS — R0602 Shortness of breath: Secondary | ICD-10-CM | POA: Insufficient documentation

## 2014-09-05 MED ORDER — LEVOTHYROXINE SODIUM 100 MCG PO TABS: 100.0000 ug | ORAL_TABLET | Freq: Every day | ORAL | Status: DC

## 2014-09-05 NOTE — Progress Notes (Signed)
   Subjective:    Patient ID: [name], female    DOB: [birth year], 33 y.o.   MRN: [id number]  HPI 33 year old female with a history of hypertension, diabetes mellitus type 2, and Hodgkin's lymphoma status post chemotherapy and radiation (now in remission) who presents with complaints of shortness of breath.  1) SOB  Patient reports that she's been short of breath approximately 2 weeks.  She reports a remote history of asthma. She has been using frequent albuterol with little improvement in her shortness of breath.  Patient reports that her shortness of breath is particularly worse with exertion.  No reports of PND or orthopnea.  Patient does report that she has little exercise capacity and is short of breath with minimal activity.  No recent fevers, chills, cough.  No associated chest pain.  Of note, patient has had an echocardiogram in December of 2014 which revealed LVH and normal ejection fraction. It was notable for moderate mitral regurgitation.    Additionally, patient has had a recent CT chest which reveals scarring and infiltrate/fibrosis from radiation.  Review of Systems Per HPI    Objective:   Physical Exam Filed Vitals:   09/04/14 1645  BP: 180/106  Pulse: 74  Temp: 99 F (37.2 C)  Resp: 20   Exam: General: well appearing female, NAD.  Cardiovascular: RRR. Soft systolic murmur noted at the upper sternal border (1-2/6). Respiratory: CTAB. No rales or wheezing noted. Patient with no increased WOB. Able to speak in full sentences.  Abdomen: soft, nontender, nondistended. Extremities: Trace LE edema.     Assessment & Plan:  See Problem List

## 2014-09-05 NOTE — Assessment & Plan Note (Signed)
Shortness of breath likely from scarring/fibrosis from radiation. Patient does have remote history of asthma but is not having much improvement with albuterol. Will send for spirometry for further evaluation of underlying lung disease.  I suspect that this will be restrictive in nature secondary to radiation damage. Patient may benefit from corticosteroids if this is the case.

## 2014-09-05 NOTE — Telephone Encounter (Signed)
Called and spoke with [name].  Relayed no change in TSH.  Increase Synthroid to 100 mcg.  To take 1.5 pills until gone.  I have sent in new script.  Also spoke with Dr. Lacinda Axon yesterday.  Will refer to pulm.  Patient informed.

## 2014-09-19 ENCOUNTER — Ambulatory Visit (INDEPENDENT_AMBULATORY_CARE_PROVIDER_SITE_OTHER): Payer: BC Managed Care – PPO | Admitting: Family Medicine

## 2014-09-19 ENCOUNTER — Encounter: Payer: Self-pay | Admitting: Family Medicine

## 2014-09-19 ENCOUNTER — Telehealth: Payer: Self-pay | Admitting: Family Medicine

## 2014-09-19 ENCOUNTER — Ambulatory Visit (HOSPITAL_COMMUNITY)
Admission: RE | Admit: 2014-09-19 | Discharge: 2014-09-19 | Disposition: A | Discharge status: Home / Self Care | Payer: Medicaid Other | Source: Ambulatory Visit | Attending: Family Medicine | Admitting: Family Medicine

## 2014-09-19 VITALS — BP 159/118 | HR 66 | Temp 98.1°F | Ht 67.5 in | Wt 248.9 lb

## 2014-09-19 DIAGNOSIS — R05 Cough: Secondary | ICD-10-CM | POA: Diagnosis not present

## 2014-09-19 DIAGNOSIS — R0602 Shortness of breath: Secondary | ICD-10-CM

## 2014-09-19 DIAGNOSIS — R0600 Dyspnea, unspecified: Secondary | ICD-10-CM | POA: Diagnosis not present

## 2014-09-19 DIAGNOSIS — R079 Chest pain, unspecified: Secondary | ICD-10-CM | POA: Diagnosis not present

## 2014-09-19 DIAGNOSIS — C8190 Hodgkin lymphoma, unspecified, unspecified site: Secondary | ICD-10-CM | POA: Insufficient documentation

## 2014-09-19 DIAGNOSIS — Z923 Personal history of irradiation: Secondary | ICD-10-CM | POA: Insufficient documentation

## 2014-09-19 DIAGNOSIS — R0781 Pleurodynia: Secondary | ICD-10-CM

## 2014-09-19 MED ORDER — PREDNISONE 50 MG PO TABS: ORAL_TABLET | ORAL | Status: DC

## 2014-09-19 NOTE — Assessment & Plan Note (Signed)
Agree likely secondary to scarring/fibrosis.   No improvement with albuterol. Ultimately think Pulm input will be most helpful.  She has been using her fiancee's Dulera daily basis without much improvement. No evidence of cardiac cause of dyspnea.  No LE edema, CP, palpitations.  Minimally enlarged heart on CXR.  Echo in Dec 2014 WNL.  No LVH on EKG.  BNP from last year was normal. CXR revealed only residual scarring.  NO acute disease.  Will attempt prednisone if inflammatory component.  FU with pulm for further recommendations.

## 2014-09-19 NOTE — Assessment & Plan Note (Signed)
Seems pleuritic and chest wall.  No evidence of cardiac involvement.   Return precautions provided.

## 2014-09-19 NOTE — Progress Notes (Signed)
Subjective:    [name] is a 33 y.o. female who presents to [facility] today for several issues:  1.  Dyspnea:  Fever last week, daughter had URI.  Some subjective chills have persisted.  Dry hacking cough present thorughout day.  Difficulty sleeping at night due to cough.  Positional 3 pillow orthopnea.  No LE edema.  No palpitations. Has reproducible pleuritic chest pain worse when she coughs.  No pain when ambulating.    2.  HTN:  Compliance has been an issue.  Elevated again today.  Has not taken meds today.  No headaches or changes in vision.    ROS as above per HPI, otherwise neg.     The following portions of the patient's history were reviewed and updated as appropriate: allergies, current medications, past medical history, family and social history, and problem list. Patient is a nonsmoker.    PMH reviewed.  Past Medical History  Diagnosis Date  . Hypertension   . Complication of anesthesia SLOW TO WAKE UP AFTER C SECTION  . Benign essential HTN 01/19/2012  . Eczema 01/19/2012  . Muscle spasm of back 03/09/2012  . S/P chemotherapy, time since 4-12 weeks 05/11/2012    4 cycles of ABVD with neulasta support and zoladex  . Cough, persistent 07/27/2012  . Leukopenia 08/01/2012  . Asthma     seasonal, worse in spring. Uses inhaler during this time of year  . Migraine   . DM II (diabetes mellitus, type II), controlled 01/19/2012  . Hodgkin lymphoma 01/18/2012    left axilla & left neck dx'd 01/16/2012  . Hypothyroidism     06/2014   Past Surgical History  Procedure Laterality Date  . Dilation and curettage of uterus    . Axillary lymph node biopsy  12/2011    left  . Portacath placement  01/25/2012    Procedure: INSERTION PORT-A-CATH;  Surgeon: Gayland Curry, MD,FACS;  Location: [facility];  Service: General;  Laterality: Right;  right subclavian  . Cesarean section  2008    Medications reviewed. Current Outpatient Prescriptions  Medication Sig Dispense Refill  . albuterol  (PROVENTIL HFA;VENTOLIN HFA) 108 (90 BASE) MCG/ACT inhaler Inhale 2 puffs into the lungs every 6 (six) hours as needed for wheezing or shortness of breath.      Marland Kitchen albuterol (PROVENTIL) (2.5 MG/3ML) 0.083% nebulizer solution Take 2.5 mg by nebulization every 6 (six) hours as needed for wheezing or shortness of breath.      Marland Kitchen amLODipine (NORVASC) 10 MG tablet Take 10 mg by mouth daily.      Marland Kitchen aspirin-acetaminophen-caffeine (EXCEDRIN MIGRAINE) 250-250-65 MG per tablet Take 2 tablets by mouth every 6 (six) hours as needed for migraine.      Marland Kitchen levothyroxine (SYNTHROID, LEVOTHROID) 100 MCG tablet Take 1 tablet (100 mcg total) by mouth daily.  30 tablet  3  . levothyroxine (SYNTHROID, LEVOTHROID) 75 MCG tablet Take 75 mcg by mouth daily before breakfast.      . lisinopril-hydrochlorothiazide (PRINZIDE,ZESTORETIC) 20-25 MG per tablet Take 1 tablet by mouth daily.      . metFORMIN (GLUCOPHAGE) 500 MG tablet Take 500 mg by mouth 2 (two) times daily with a meal.       . metoprolol succinate (TOPROL-XL) 100 MG 24 hr tablet Take 100 mg by mouth daily. Take with or immediately following a meal.       No current facility-administered medications for this visit.     Objective:   Physical Exam BP 159/118  Pulse 66  Temp(Src) 98.1 F (36.7 C) (Oral)  Ht 5' 7.5" (1.715 m)  Wt 248 lb 14.4 oz (112.9 kg)  BMI 38.39 kg/m2 Gen:  Alert, cooperative patient who appears stated age in no acute distress.  Vital signs reviewed. HEENT: EOMI,  MMM Neck:  Trachea midline Chest:  TTP along Left and right sternal border at ribs 2-3 Cardiac:  Regular rate and rhythm  Pulm:  Clear to auscultation throughout.  Good air movement. No wheezing.   Ext:  No LE edema.   No results found for this or any previous visit (from the past 72 hour(s)).

## 2014-09-19 NOTE — Telephone Encounter (Signed)
Called and left message that patient's CXR showed no acute disease, did show scarring.  Sent in prednisone.  To call if any questions or needs more details.

## 2014-09-29 ENCOUNTER — Encounter: Payer: Self-pay | Admitting: Family Medicine

## 2014-10-08 ENCOUNTER — Institutional Professional Consult (permissible substitution): Payer: Self-pay | Admitting: Internal Medicine

## 2014-10-16 ENCOUNTER — Other Ambulatory Visit (INDEPENDENT_AMBULATORY_CARE_PROVIDER_SITE_OTHER): Payer: BC Managed Care – PPO

## 2014-10-16 ENCOUNTER — Encounter: Payer: Self-pay | Admitting: Internal Medicine

## 2014-10-16 ENCOUNTER — Ambulatory Visit (INDEPENDENT_AMBULATORY_CARE_PROVIDER_SITE_OTHER): Payer: BC Managed Care – PPO | Admitting: Internal Medicine

## 2014-10-16 VITALS — BP 148/90 | HR 77 | Ht 67.5 in | Wt 253.0 lb

## 2014-10-16 DIAGNOSIS — R0602 Shortness of breath: Secondary | ICD-10-CM

## 2014-10-16 LAB — CBC WITH DIFFERENTIAL/PLATELET
BASOS PCT: 0.5 % (ref 0.0–3.0)
Basophils Absolute: 0 10*3/uL (ref 0.0–0.1)
EOS ABS: 0.2 10*3/uL (ref 0.0–0.7)
EOS PCT: 4.9 % (ref 0.0–5.0)
HCT: 38.2 % (ref 36.0–46.0)
HEMOGLOBIN: 12.8 g/dL (ref 12.0–15.0)
Lymphocytes Relative: 32 % (ref 12.0–46.0)
Lymphs Abs: 1.1 10*3/uL (ref 0.7–4.0)
MCHC: 33.6 g/dL (ref 30.0–36.0)
MCV: 94.7 fl (ref 78.0–100.0)
MONO ABS: 0.3 10*3/uL (ref 0.1–1.0)
Monocytes Relative: 10 % (ref 3.0–12.0)
NEUTROS ABS: 1.8 10*3/uL (ref 1.4–7.7)
Neutrophils Relative %: 52.6 % (ref 43.0–77.0)
Platelets: 226 10*3/uL (ref 150.0–400.0)
RBC: 4.03 Mil/uL (ref 3.87–5.11)
RDW: 12.3 % (ref 11.5–15.5)
WBC: 3.3 10*3/uL — ABNORMAL LOW (ref 4.0–10.5)

## 2014-10-16 LAB — BASIC METABOLIC PANEL
BUN: 11 mg/dL (ref 6–23)
CALCIUM: 8.9 mg/dL (ref 8.4–10.5)
CO2: 26 mEq/L (ref 19–32)
CREATININE: 0.7 mg/dL (ref 0.4–1.2)
Chloride: 105 mEq/L (ref 96–112)
GFR: 117.77 mL/min (ref >60.00)
GLUCOSE: 203 mg/dL — AB (ref 70–99)
POTASSIUM: 4.4 meq/L (ref 3.5–5.1)
Sodium: 138 mEq/L (ref 135–145)

## 2014-10-16 LAB — BRAIN NATRIURETIC PEPTIDE: Pro B Natriuretic peptide (BNP): 23 pg/mL (ref 0.0–100.0)

## 2014-10-16 LAB — TSH: TSH: 6.42 u[IU]/mL — AB (ref 0.35–4.50)

## 2014-10-16 MED ORDER — VALSARTAN-HYDROCHLOROTHIAZIDE 160-25 MG PO TABS: 1.0000 | ORAL_TABLET | Freq: Every day | ORAL | Status: DC

## 2014-10-16 NOTE — Progress Notes (Signed)
Subjective:    Patient ID: [name], female    DOB: 1981/10/06   MRN: [id number]  HPI  33 yobf never resp problems and only smoked a little and stopped age 33 but many visits to ER with Bronchitis that stopped after quit smoking then dx HD 2013 and underwent chemo/RT completed oct 2013 with onset of mild doe ? Some better p saba hfa/neb referred 10/16/2014 to pulmonary clinic by Dr Mingo Amber at [facility] practice.    10/16/2014 1st [facility] office visit/ W[facility]   Chief Complaint  Patient presents with  . Pulmonary Consult    Referred by Dr. Mingo Amber. Pt c/o SOB, PC and cough for the past 3 months. She states that she gets SOB with or without any exertion. The CP is pretty much constant. Her cough is non prod and not triggered by anything specific. She has rescue inhaler but not using b/c it does not seem to help. She has used neb with albuterol x 1 in the past wk.   breathing much worse than usual one day p port removed 08/21/14 and no better since  Assoc dry hacking cough Worse with talking and looses breath at rest and not better p neb  gen chest discomfort anteriorly with cough , nothing classically lateralizing/ localized or  pleuritic or exertional   No obvious other patterns in day to day or daytime variabilty or assoc  chest tightness, subjective wheeze overt sinus or hb symptoms. No unusual exp hx or h/o childhood pna/ asthma or knowledge of premature birth.  Sleeping ok without nocturnal  or early am exacerbation  of respiratory  c/o's or need for noct saba. Also denies any obvious fluctuation of symptoms with weather or environmental changes or other aggravating or alleviating factors except as outlined above   Current Medications, Allergies, Complete Past Medical History, Past Surgical History, Family History, and Social History were reviewed in [employer] record.                Review of Systems  Constitutional: Negative for fever,  chills and unexpected weight change.  HENT: Negative for congestion, dental problem, ear pain, nosebleeds, postnasal drip, rhinorrhea, sinus pressure, sneezing, sore throat, trouble swallowing and voice change.   Eyes: Negative for visual disturbance.  Respiratory: Positive for cough and shortness of breath. Negative for choking.   Cardiovascular: Positive for chest pain. Negative for leg swelling.  Gastrointestinal: Negative for vomiting, abdominal pain and diarrhea.  Genitourinary: Negative for difficulty urinating.  Musculoskeletal: Negative for arthralgias.  Skin: Negative for rash.  Neurological: Positive for headaches. Negative for tremors and syncope.  Hematological: Does not bruise/bleed easily.       Objective:   Physical Exam  amb bf with mild pseudowheeze  Wt Readings from Last 3 Encounters:  10/16/14 253 lb (114.76 kg)  09/19/14 248 lb 14.4 oz (112.9 kg)  09/04/14 235 lb (106.595 kg)    Vital signs reviewed   HEENT: nl dentition, turbinates, and orophanx. Nl external ear canals without cough reflex   NECK :  without JVD/Nodes/TM/ nl carotid upstrokes bilaterally   LUNGS: no acc muscle use, clear to A and P bilaterally without cough on insp or exp maneuvers   CV:  RRR  no s3 or murmur or increase in P2, no edema   ABD:  soft and nontender with nl excursion in the supine position. No bruits or organomegaly, bowel sounds nl  MS:  warm without deformities, calf tenderness, cyanosis or  clubbing  SKIN: warm and dry without lesions    NEURO:  alert, approp, no deficits    cxr  09/19/14  Cardiomediastinal silhouette is stable. No acute infiltrate or  pleural effusion. No pulmonary edema. Stable scarring in left upper  lobe.    Recent Labs Lab 10/16/14 1157  NA 138  K 4.4  CL 105  CO2 26  BUN 11  CREATININE 0.7  GLUCOSE 203*    Recent Labs Lab 10/16/14 1157  HGB 12.8  HCT 38.2  WBC 3.3*  PLT 226.0     Lab Results  Component Value Date    TSH 6.42* 10/16/2014     Lab Results  Component Value Date   PROBNP 23.0 10/16/2014     Lab Results  Component Value Date   ESRSEDRATE 17 07/31/2014      Lab Results  Component Value Date   DDIMER 0.27 10/16/2014       Assessment & Plan:

## 2014-10-16 NOTE — Patient Instructions (Addendum)
Stop lisinopril  Start valsartan 160/25 one daily   GERD (REFLUX)  is an extremely common cause of respiratory symptoms just like yours , many times with no obvious heartburn at all.    It can be treated with medication, but also with lifestyle changes including avoidance of late meals, excessive alcohol, smoking cessation, and avoid fatty foods, chocolate, peppermint, colas, red wine, and acidic juices such as orange juice.  NO MINT OR MENTHOL PRODUCTS SO NO COUGH DROPS  USE SUGARLESS CANDY INSTEAD (Jolley ranchers or Stover's or Life Savers) or even ice chips will also do - the key is to swallow to prevent all throat clearing. NO OIL BASED VITAMINS - use powdered substitutes.    Please schedule a follow up office visit in 4 weeks, sooner if needed with pfts

## 2014-10-17 ENCOUNTER — Encounter: Payer: Self-pay | Admitting: Family Medicine

## 2014-10-17 ENCOUNTER — Ambulatory Visit (INDEPENDENT_AMBULATORY_CARE_PROVIDER_SITE_OTHER): Payer: BC Managed Care – PPO | Admitting: Family Medicine

## 2014-10-17 ENCOUNTER — Ambulatory Visit (HOSPITAL_COMMUNITY)
Admission: RE | Admit: 2014-10-17 | Discharge: 2014-10-17 | Disposition: A | Discharge status: Home / Self Care | Payer: Medicaid Other | Source: Ambulatory Visit | Attending: Family Medicine | Admitting: Family Medicine

## 2014-10-17 VITALS — BP 157/105 | HR 82 | Temp 98.2°F | Ht 67.5 in | Wt 251.0 lb

## 2014-10-17 DIAGNOSIS — R0600 Dyspnea, unspecified: Secondary | ICD-10-CM | POA: Diagnosis present

## 2014-10-17 DIAGNOSIS — R079 Chest pain, unspecified: Secondary | ICD-10-CM | POA: Insufficient documentation

## 2014-10-17 DIAGNOSIS — E119 Type 2 diabetes mellitus without complications: Secondary | ICD-10-CM | POA: Diagnosis not present

## 2014-10-17 DIAGNOSIS — R0602 Shortness of breath: Secondary | ICD-10-CM | POA: Insufficient documentation

## 2014-10-17 DIAGNOSIS — I10 Essential (primary) hypertension: Secondary | ICD-10-CM | POA: Insufficient documentation

## 2014-10-17 LAB — POCT GLYCOSYLATED HEMOGLOBIN (HGB A1C): Hemoglobin A1C: 9.5

## 2014-10-17 LAB — D-DIMER, QUANTITATIVE: D-Dimer, Quant: 0.27 ug/mL-FEU (ref 0.00–0.48)

## 2014-10-17 MED ORDER — IPRATROPIUM-ALBUTEROL 18-103 MCG/ACT IN AERO: 2.0000 | INHALATION_SPRAY | Freq: Four times a day (QID) | RESPIRATORY_TRACT | Status: DC | PRN

## 2014-10-17 MED ORDER — ALBUTEROL SULFATE (2.5 MG/3ML) 0.083% IN NEBU
2.5000 mg | INHALATION_SOLUTION | Freq: Once | RESPIRATORY_TRACT | Status: AC
  Administered 2014-10-17: 2.5 mg via RESPIRATORY_TRACT

## 2014-10-17 MED ORDER — IPRATROPIUM BROMIDE 0.02 % IN SOLN
0.5000 mg | Freq: Once | RESPIRATORY_TRACT | Status: AC
  Administered 2014-10-17: 0.5 mg via RESPIRATORY_TRACT

## 2014-10-17 NOTE — Assessment & Plan Note (Signed)
Persists.   Did trial of Atrovent/albuterol nebulizer here in clinic. Experienced good improvement in dyspnea.  She was no longer breathless after long sentences, laughing, able to walk around clinic without becoming breathless. Sent in Combivent to help with dyspnea before being seen back at [facility].  To stop several days before PFTs.   Ultimately, I think PFT's will be helpful in delineating cause of her dyspnea.   Explained about "silent reflux" as she is very doubtful she has reflux.  Encouraged trial of PPI.   Fu with me after she sees Dr. Melvyn Novas again.

## 2014-10-17 NOTE — Progress Notes (Signed)
Subjective:    [name] is a 33 y.o. female who presents to [facility] today:  1.  Dyspnea:  Chronic,unremitting.  Saw Dr. Melvyn Novas pulmonology yesterday.  Diagnosed with GERD.  She has not started GERD meds yesterday.  Hasn't started new BP meds either.    Describes dyspnea with minimal exertion.  Constant sharp, reproducible chest pain.  Breathless after long sentences.  Dry cough most of the time.  Keeps her awake atnight.   On further questioning she has been using her Dulera intermittently as rescue inhaler. Hasn't used Albuterol for weeks because states not helping.  Minimal help with Dulera.   No fevers or chills.  No weight loss. Denies any reflux symptoms.    ROS as above per HPI, otherwise neg.    The following portions of the patient's history were reviewed and updated as appropriate: allergies, current medications, past medical history, family and social history, and problem list. Patient is a nonsmoker.    PMH reviewed.  Past Medical History  Diagnosis Date  . Hypertension   . Complication of anesthesia SLOW TO WAKE UP AFTER C SECTION  . Benign essential HTN 01/19/2012  . Eczema 01/19/2012  . Muscle spasm of back 03/09/2012  . S/P chemotherapy, time since 4-12 weeks 05/11/2012    4 cycles of ABVD with neulasta support and zoladex  . Cough, persistent 07/27/2012  . Leukopenia 08/01/2012  . Asthma     seasonal, worse in spring. Uses inhaler during this time of year  . Migraine   . DM II (diabetes mellitus, type II), controlled 01/19/2012  . Hodgkin lymphoma 01/18/2012    left axilla & left neck dx'd 01/16/2012  . Hypothyroidism     06/2014   Past Surgical History  Procedure Laterality Date  . Dilation and curettage of uterus    . Axillary lymph node biopsy  12/2011    left  . Portacath placement  01/25/2012    Procedure: INSERTION PORT-A-CATH;  Surgeon: Gayland Curry, MD,FACS;  Location: [facility];  Service: General;  Laterality: Right;  right subclavian  . Cesarean section   2008    Medications reviewed. Current Outpatient Prescriptions  Medication Sig Dispense Refill  . albuterol (PROVENTIL HFA;VENTOLIN HFA) 108 (90 BASE) MCG/ACT inhaler Inhale 2 puffs into the lungs every 6 (six) hours as needed for wheezing or shortness of breath.    Marland Kitchen albuterol (PROVENTIL) (2.5 MG/3ML) 0.083% nebulizer solution Take 2.5 mg by nebulization every 6 (six) hours as needed for wheezing or shortness of breath.    Marland Kitchen amLODipine (NORVASC) 10 MG tablet Take 10 mg by mouth daily.    Marland Kitchen aspirin-acetaminophen-caffeine (EXCEDRIN MIGRAINE) 250-250-65 MG per tablet Take 2 tablets by mouth every 6 (six) hours as needed for migraine.    Marland Kitchen levothyroxine (SYNTHROID, LEVOTHROID) 100 MCG tablet Take 1 tablet (100 mcg total) by mouth daily. 30 tablet 3  . metFORMIN (GLUCOPHAGE) 500 MG tablet Take 500 mg by mouth 2 (two) times daily with a meal.     . metoprolol succinate (TOPROL-XL) 100 MG 24 hr tablet Take 100 mg by mouth daily. Take with or immediately following a meal.    . valsartan-hydrochlorothiazide (DIOVAN HCT) 160-25 MG per tablet Take 1 tablet by mouth daily. 30 tablet 11   No current facility-administered medications for this visit.     Objective:   Physical Exam BP 157/105 mmHg  Pulse 82  Temp(Src) 98.2 F (36.8 C) (Oral)  Ht 5' 7.5" (1.715 m)  Wt 251 lb (  113.853 kg)  BMI 38.71 kg/m2  SpO2 98% Gen:  Alert, cooperative patient who appears stated age in no acute distress.  Vital signs reviewed. HEENT: EOMI,  MMM Cardiac:  Regular rate and rhythm  Pulm:  Clear to auscultation bilaterally Exts: Non edematous BL  LE, warm and well perfused.

## 2014-10-17 NOTE — Patient Instructions (Signed)
Try the Combivent every 4-6 hours if you need it.  For the next 3-4 days, schedule it.  After that, use it as needed.  Try the acid suppressing to help with this.    Follow up with Dr. Melvyn Novas for the lung testing.

## 2014-10-17 NOTE — Assessment & Plan Note (Signed)
Uncontrolled today. However, she has not yet picked up her new meds.  Encouraged to do so.

## 2014-10-18 NOTE — Assessment & Plan Note (Addendum)
10/16/2014  Walked RA x 3 laps @ 185 ft each stopped due to  End of study, no desats   Symptoms are markedly disproportionate to objective findings and not clear this is a lung problem but pt does appear to have difficult airway management issues. DDX of  difficult airways management all start with A and  include Adherence, Ace Inhibitors, Acid Reflux, Active Sinus Disease, Alpha 1 Antitripsin deficiency, Anxiety masquerading as Airways dz,  ABPA,  allergy(esp in young), Aspiration (esp in elderly), Adverse effects of DPI,  Active smokers, plus two Bs  = Bronchiectasis and Beta blocker use..and one C= CHF    Adherence is always the initial "prime suspect" and is a multilayered concern that requires a "trust but verify" approach in every patient - starting with knowing how to use medications, especially inhalers, correctly, keeping up with refills and understanding the fundamental difference between maintenance and prns vs those medications only taken for a very short course and then stopped and not refilled.   ACEi at top of list of suspects,  Only way to exclude is trial off  ? Acid (or non-acid) GERD > always difficult to exclude as up to 75% of pts in some series report no assoc GI/ Heartburn symptoms> rec max (24h)  acid suppression and diet restrictions/ reviewed and instructions given in writing.   Explained to pt that a little reflux, which can be caused by coughing (which she is doing) can cause upper further upper airway inflammation and that the acie ion board is "fanning the fire" of upper airways inflammation because it blocks the bradykinin degradation assoc with any inflammation ( iet reflux and acei are synergistic) threfore once she stops coughing should be able to stop the gerd rx safely.  ? Asthma/ allergies> unlikely as reports no resp to even nebs in past but needs pfts/ methacholine to exclude if not all better p above rx   ? chf > very unlikely given hx/ exam/ xrays/ bnp <  100  A PE > D dimer nl - while  A nl valute  may miss small peripheral pe, the clot burden with sob is moderately high and the d dimer has a very high neg pred value in this setting       See instructions for specific recommendations which were reviewed directly with the patient who was given a copy with highlighter outlining the key components.

## 2014-10-20 ENCOUNTER — Telehealth: Payer: Self-pay | Admitting: Family Medicine

## 2014-10-20 NOTE — Telephone Encounter (Signed)
Pt is returning Dr. Cindra Presume call. She said to try her back in the morning. jw

## 2014-10-20 NOTE — Progress Notes (Signed)
Quick Note:  LMTCB ______ 

## 2014-10-20 NOTE — Telephone Encounter (Signed)
Called to discuss [name]'s elevated A1C from last visit.  Because of the concern of her breathing, we did not even discuss this.  Gave report of A1C's value and asked her to call me back.  Will await her call.

## 2014-10-27 NOTE — Progress Notes (Signed)
Quick Note:  LMTCB ______ 

## 2014-10-29 NOTE — Telephone Encounter (Signed)
Tried returning call again.  Had to leave voicemail.

## 2014-11-13 ENCOUNTER — Other Ambulatory Visit: Payer: Self-pay | Admitting: Internal Medicine

## 2014-11-13 ENCOUNTER — Ambulatory Visit (INDEPENDENT_AMBULATORY_CARE_PROVIDER_SITE_OTHER): Payer: Medicaid Other | Admitting: Internal Medicine

## 2014-11-13 ENCOUNTER — Encounter: Payer: Self-pay | Admitting: Internal Medicine

## 2014-11-13 VITALS — BP 138/98 | HR 80 | Ht 67.5 in | Wt 252.0 lb

## 2014-11-13 DIAGNOSIS — I10 Essential (primary) hypertension: Secondary | ICD-10-CM

## 2014-11-13 DIAGNOSIS — R0600 Dyspnea, unspecified: Secondary | ICD-10-CM

## 2014-11-13 DIAGNOSIS — R0602 Shortness of breath: Secondary | ICD-10-CM

## 2014-11-13 LAB — PULMONARY FUNCTION TEST
DL/VA % PRED: 102 %
DL/VA: 5.33 ml/min/mmHg/L
DLCO UNC % PRED: 70 %
DLCO UNC: 20.46 ml/min/mmHg
FEF 25-75 POST: 2.73 L/s
FEF 25-75 PRE: 1.93 L/s
FEF2575-%Change-Post: 41 %
FEF2575-%PRED-PRE: 56 %
FEF2575-%Pred-Post: 79 %
FEV1-%Change-Post: 6 %
FEV1-%Pred-Post: 80 %
FEV1-%Pred-Pre: 75 %
FEV1-POST: 2.45 L
FEV1-Pre: 2.31 L
FEV1FVC-%CHANGE-POST: 8 %
FEV1FVC-%Pred-Pre: 91 %
FEV6-%Change-Post: -3 %
FEV6-%PRED-POST: 81 %
FEV6-%PRED-PRE: 83 %
FEV6-Post: 2.89 L
FEV6-Pre: 2.98 L
FEV6FVC-%Pred-Post: 101 %
FEV6FVC-%Pred-Pre: 101 %
FVC-%CHANGE-POST: -2 %
FVC-%Pred-Post: 80 %
FVC-%Pred-Pre: 82 %
FVC-POST: 2.9 L
FVC-Pre: 2.98 L
POST FEV6/FVC RATIO: 100 %
PRE FEV1/FVC RATIO: 78 %
Post FEV1/FVC ratio: 84 %
Pre FEV6/FVC Ratio: 100 %
RV % PRED: 129 %
RV: 2.05 L
TLC % pred: 88 %
TLC: 4.92 L

## 2014-11-13 NOTE — Patient Instructions (Signed)
Keep your rescue inhaler hand and if you need to use it more twice a week or need the nebulizer at any point we need to see you right away   Stop dulera  If you need to return please bring  All inhalers, meds/ neb solutions with you

## 2014-11-13 NOTE — Progress Notes (Signed)
Subjective:    Patient ID: [name], female    DOB: 1981-03-02   MRN: [id number]    Brief patient profile:  50 yobf never resp problems and only smoked a little and stopped age 33 but many visits to ER with Bronchitis that stopped after quit smoking then dx HD 2013 and underwent chemo/RT completed oct 2013 with onset of mild doe ? Some better p saba hfa/neb referred 10/16/2014 to pulmonary clinic by Dr M[facility] with nl pfts 11/13/14 and marked improvement in symptoms off ACEi    History of Present Illness  10/16/2014 1st [facility] office visit/ [facility]   Chief Complaint  Patient presents with  . Pulmonary Consult    Referred by Dr. Mingo Amber. Pt c/o SOB, PC and cough for the past 3 months. She states that she gets SOB with or without any exertion. The CP is pretty much constant. Her cough is non prod and not triggered by anything specific. She has rescue inhaler but not using b/c it does not seem to help. She has used neb with albuterol x 1 in the past wk.   breathing much worse than usual one day p port removed 08/21/14 and no better since  Assoc dry hacking cough Worse with talking and looses breath at rest and not better p neb  gen chest discomfort anteriorly with cough , nothing classically lateralizing/ localized or  pleuritic or exertional  rec Stop lisinopril Start valsartan 160/25 one daily  GERD diet    11/13/2014 f/u ov/[facility]ert re: cough ? Asthma component  Chief Complaint  Patient presents with  . Follow-up    Pt states that her breathing has improved some since the last visit. She is using combivent 1 to 2 times daily when she is working and none on her days off. She has used neb once in the past wk.  Her cough is much improved. She states that she only notices the CP now with alot of exertion.      Confused as to which med is combivent and which is some other inhaler "has 4 " but use is way down and generally doing much better  No obvious  day to day or daytime variabilty or assoc chronic cough or cp or chest tightness, subjective wheeze overt sinus or hb symptoms. No unusual exp hx or h/o childhood pna/ asthma or knowledge of premature birth.  Sleeping ok without nocturnal  or early am exacerbation  of respiratory  c/o's or need for noct saba. Also denies any obvious fluctuation of symptoms with weather or environmental changes or other aggravating or alleviating factors except as outlined above   Current Medications, Allergies, Complete Past Medical History, Past Surgical History, Family History, and Social History were reviewed in [employer] record.  ROS  The following are not active complaints unless bolded sore throat, dysphagia, dental problems, itching, sneezing,  nasal congestion or excess/ purulent secretions, ear ache,   fever, chills, sweats, unintended wt loss, pleuritic or exertional cp, hemoptysis,  orthopnea pnd or leg swelling, presyncope, palpitations, heartburn, abdominal pain, anorexia, nausea, vomiting, diarrhea  or change in bowel or urinary habits, change in stools or urine, dysuria,hematuria,  rash, arthralgias, visual complaints, headache, numbness weakness or ataxia or problems with walking or coordination,  change in mood/affect or memory.  Objective:   Physical Exam  amb bf with  pseudowheeze resolved   11/13/14         252  Wt Readings from Last 3 Encounters:  10/16/14 253 lb (114.76 kg)  09/19/14 248 lb 14.4 oz (112.9 kg)  09/04/14 235 lb (106.595 kg)    Vital signs reviewed   HEENT: nl dentition, turbinates, and orophanx. Nl external ear canals without cough reflex   NECK :  without JVD/Nodes/TM/ nl carotid upstrokes bilaterally   LUNGS: no acc muscle use, clear to A and P bilaterally without cough on insp or exp maneuvers   CV:  RRR  no s3 or murmur or increase in P2, no edema   ABD:  soft and nontender with nl excursion in  the supine position. No bruits or organomegaly, bowel sounds nl  MS:  warm without deformities, calf tenderness, cyanosis or clubbing  SKIN: warm and dry without lesions    NEURO:  alert, approp, no deficits    cxr  09/19/14  Cardiomediastinal silhouette is stable. No acute infiltrate or  pleural effusion. No pulmonary edema. Stable scarring in left upper  lobe.    Recent Labs Lab 10/16/14 1157  NA 138  K 4.4  CL 105  CO2 26  BUN 11  CREATININE 0.7  GLUCOSE 203*    Recent Labs Lab 10/16/14 1157  HGB 12.8  HCT 38.2  WBC 3.3*  PLT 226.0     Lab Results  Component Value Date   TSH 6.42* 10/16/2014     Lab Results  Component Value Date   PROBNP 23.0 10/16/2014     Lab Results  Component Value Date   ESRSEDRATE 17 07/31/2014      Lab Results  Component Value Date   DDIMER 0.27 10/16/2014       Assessment & Plan:

## 2014-11-13 NOTE — Progress Notes (Signed)
PFT done today. 

## 2014-11-14 NOTE — Assessment & Plan Note (Addendum)
10/16/2014  Walked RA x 3 laps @ 185 ft each stopped due to  End of study, no desats - trial off acei rec  10/16/2014 > much better 11/13/14 - pfts 11/13/14 wnl ? After rx (she's confused as to what she's been taking)   She could therefore have "treated asthma" and still have nl pfts but I suspect most of her problem is pseudoasthma exac by use of ACEi and would avoid this class completely given the ambiguity inherent in interpreting her non-specific symptoms  No pulmonary f/u needed  Unless she needs her rescue inhaler more than twice daily   See instructions for specific recommendations which were reviewed directly with the patient who was given a copy with highlighter outlining the key components.

## 2014-11-14 NOTE — Assessment & Plan Note (Signed)
Since age 33; resistant to Rx - d/c'd acei 10/16/14 due to pseudoasthma > resolved  Marginal but Adequate control on present rx, reviewed > no change in rx needed  > f/u primary care

## 2014-12-30 ENCOUNTER — Encounter: Payer: Self-pay | Admitting: Family Medicine

## 2014-12-30 ENCOUNTER — Ambulatory Visit (INDEPENDENT_AMBULATORY_CARE_PROVIDER_SITE_OTHER): Payer: Medicaid Other | Admitting: Family Medicine

## 2014-12-30 VITALS — BP 135/83 | HR 90 | Temp 97.9°F | Ht 67.5 in | Wt 237.7 lb

## 2014-12-30 DIAGNOSIS — I10 Essential (primary) hypertension: Secondary | ICD-10-CM | POA: Diagnosis not present

## 2014-12-30 DIAGNOSIS — N760 Acute vaginitis: Secondary | ICD-10-CM | POA: Diagnosis not present

## 2014-12-30 DIAGNOSIS — E038 Other specified hypothyroidism: Secondary | ICD-10-CM | POA: Diagnosis not present

## 2014-12-30 DIAGNOSIS — E119 Type 2 diabetes mellitus without complications: Secondary | ICD-10-CM

## 2014-12-30 DIAGNOSIS — A499 Bacterial infection, unspecified: Secondary | ICD-10-CM | POA: Diagnosis not present

## 2014-12-30 DIAGNOSIS — B9689 Other specified bacterial agents as the cause of diseases classified elsewhere: Secondary | ICD-10-CM

## 2014-12-30 LAB — POCT GLYCOSYLATED HEMOGLOBIN (HGB A1C): HEMOGLOBIN A1C: 13.5

## 2014-12-30 MED ORDER — METFORMIN HCL 500 MG PO TABS: 500.0000 mg | ORAL_TABLET | Freq: Two times a day (BID) | ORAL | Status: DC

## 2014-12-30 MED ORDER — LEVOTHYROXINE SODIUM 100 MCG PO TABS: 100.0000 ug | ORAL_TABLET | Freq: Every day | ORAL | Status: DC

## 2014-12-30 MED ORDER — METRONIDAZOLE 500 MG PO TABS: 500.0000 mg | ORAL_TABLET | Freq: Two times a day (BID) | ORAL | Status: DC

## 2014-12-30 NOTE — Patient Instructions (Signed)
I've sent in the Flagyl, thyroid medicine, and metformin.   REchecking the A1C today.  Keep doing what you're doing as far as weight loss and better diet.    It was good to see you again today!

## 2015-01-01 ENCOUNTER — Telehealth: Payer: Self-pay | Admitting: Family Medicine

## 2015-01-01 ENCOUNTER — Encounter: Payer: Self-pay | Admitting: Family Medicine

## 2015-01-01 DIAGNOSIS — E119 Type 2 diabetes mellitus without complications: Secondary | ICD-10-CM

## 2015-01-01 NOTE — Assessment & Plan Note (Signed)
Last checked about 6 weeks ago. Needs recheck to see if at steady state.

## 2015-01-01 NOTE — Assessment & Plan Note (Signed)
Refill for FLagyl today. TO call if no resolution.

## 2015-01-01 NOTE — Assessment & Plan Note (Signed)
Greatly worsened A1C. Patient left before this could be discussed with her.  Denied any polyuric/polydypsia symptoms in clinic. Will have her FU for random CBG.  May need insulin versus sulfonylurea.  Has made changes to her diet, so unclear current CBG levels.

## 2015-01-01 NOTE — Progress Notes (Signed)
Subjective:    [name] is a 34 y.o. female who presents to [facility] today:  1.  Hypothyroidism:  On increased dose of levothyroxine.  Denies any palpitations or heat/cold intolerance.  Has had purposeful weight loss of about 15 lbs since she has started new vegetable based diet and working out with a Physiological scientist.   2.  Vaginal DC:  Started about 1 week ago.  Self-treated herself with OTC anti-yeast medications but no relief.  Brownish DC with foul odor.  No new sexual partners.  No abdominal pain/fevers.  Hx/o BV in past.  Declines pelvic today.    3. Diabetes:  Currently on metformin.  No adverse effects from medication.  No hypoglycemic events.  No paresthesia or peripheral nerve pain.  Has completely changed her diet.   Last A1C was 9.5, which was higher than her usual range of 6.  ROS as above per HPI, otherwise neg.    The following portions of the patient's history were reviewed and updated as appropriate: allergies, current medications, past medical history, family and social history, and problem list.    PMH reviewed.  Past Medical History  Diagnosis Date  . Hypertension   . Complication of anesthesia SLOW TO WAKE UP AFTER C SECTION  . Benign essential HTN 01/19/2012  . Eczema 01/19/2012  . Muscle spasm of back 03/09/2012  . S/P chemotherapy, time since 4-12 weeks 05/11/2012    4 cycles of ABVD with neulasta support and zoladex  . Cough, persistent 07/27/2012  . Leukopenia 08/01/2012  . Asthma     seasonal, worse in spring. Uses inhaler during this time of year  . Migraine   . DM II (diabetes mellitus, type II), controlled 01/19/2012  . Hodgkin lymphoma 01/18/2012    left axilla & left neck dx'd 01/16/2012  . Hypothyroidism     06/2014   Past Surgical History  Procedure Laterality Date  . Dilation and curettage of uterus    . Axillary lymph node biopsy  12/2011    left  . Portacath placement  01/25/2012    Procedure: INSERTION PORT-A-CATH;  Surgeon: Gayland Curry,  MD,FACS;  Location: [facility];  Service: General;  Laterality: Right;  right subclavian  . Cesarean section  2008    Medications reviewed. Current Outpatient Prescriptions  Medication Sig Dispense Refill  . albuterol (PROVENTIL) (2.5 MG/3ML) 0.083% nebulizer solution Take 2.5 mg by nebulization every 6 (six) hours as needed for wheezing or shortness of breath.    Marland Kitchen albuterol-ipratropium (COMBIVENT) 18-103 MCG/ACT inhaler Inhale 2 puffs into the lungs every 6 (six) hours as needed for wheezing or shortness of breath. 14.7 g 1  . amLODipine (NORVASC) 10 MG tablet Take 10 mg by mouth daily.    Marland Kitchen aspirin-acetaminophen-caffeine (EXCEDRIN MIGRAINE) 250-250-65 MG per tablet Take 2 tablets by mouth every 6 (six) hours as needed for migraine.    Marland Kitchen levothyroxine (SYNTHROID, LEVOTHROID) 100 MCG tablet Take 1 tablet (100 mcg total) by mouth daily. 30 tablet 3  . metFORMIN (GLUCOPHAGE) 500 MG tablet Take 1 tablet (500 mg total) by mouth 2 (two) times daily with a meal. 60 tablet 6  . metoprolol succinate (TOPROL-XL) 100 MG 24 hr tablet Take 100 mg by mouth daily. Take with or immediately following a meal.    . metroNIDAZOLE (FLAGYL) 500 MG tablet Take 1 tablet (500 mg total) by mouth 2 (two) times daily. 14 tablet 0  . Probiotic Product (PROBIOTIC DAILY) CAPS Take 1 capsule by mouth daily.    Marland Kitchen  valsartan-hydrochlorothiazide (DIOVAN HCT) 160-25 MG per tablet Take 1 tablet by mouth daily. 30 tablet 11   No current facility-administered medications for this visit.     Objective:   Physical Exam BP 135/83 mmHg  Pulse 90  Temp(Src) 97.9 F (36.6 C) (Oral)  Ht 5' 7.5" (1.715 m)  Wt 237 lb 11.2 oz (107.82 kg)  BMI 36.66 kg/m2 Gen:  Alert, cooperative patient who appears stated age in no acute distress.  Vital signs reviewed. HEENT: EOMI,  MMM Cardiac:  Regular rate and rhythm . Pulm:  Clear to auscultation bilaterally    Abd:  Soft/nondistended/nontender.  .  Exts: Non edematous BL LE's   Results for  orders placed or performed in visit on 12/30/14 (from the past 72 hour(s))  POCT glycosylated hemoglobin (Hb A1C)     Status: None   Collection Time: 12/30/14 11:43 AM  Result Value Ref Range   Hemoglobin A1C 13.5

## 2015-01-01 NOTE — Telephone Encounter (Signed)
Called to discuss A1C with patient.  However she did not answer and received back report that mailbox is full.  Will need to call back.  Will send letter in the meantime.

## 2015-01-01 NOTE — Assessment & Plan Note (Signed)
Better today than usual.  Continue current regimen.

## 2015-01-02 ENCOUNTER — Other Ambulatory Visit: Payer: Self-pay | Admitting: *Deleted

## 2015-01-02 DIAGNOSIS — R0600 Dyspnea, unspecified: Secondary | ICD-10-CM

## 2015-01-02 MED ORDER — ALBUTEROL SULFATE HFA 108 (90 BASE) MCG/ACT IN AERS: 2.0000 | INHALATION_SPRAY | Freq: Four times a day (QID) | RESPIRATORY_TRACT | Status: DC | PRN

## 2015-01-25 ENCOUNTER — Encounter (HOSPITAL_COMMUNITY): Payer: Self-pay | Admitting: Emergency Medicine

## 2015-01-25 ENCOUNTER — Emergency Department (HOSPITAL_COMMUNITY)
Admission: EM | Admit: 2015-01-25 | Discharge: 2015-01-26 | Disposition: A | Discharge status: Home / Self Care | Payer: Medicaid Other | Attending: Emergency Medicine | Admitting: Emergency Medicine

## 2015-01-25 DIAGNOSIS — I10 Essential (primary) hypertension: Secondary | ICD-10-CM | POA: Diagnosis not present

## 2015-01-25 DIAGNOSIS — Z79899 Other long term (current) drug therapy: Secondary | ICD-10-CM | POA: Diagnosis not present

## 2015-01-25 DIAGNOSIS — J45909 Unspecified asthma, uncomplicated: Secondary | ICD-10-CM | POA: Insufficient documentation

## 2015-01-25 DIAGNOSIS — Z880 Allergy status to penicillin: Secondary | ICD-10-CM | POA: Insufficient documentation

## 2015-01-25 DIAGNOSIS — Z87891 Personal history of nicotine dependence: Secondary | ICD-10-CM | POA: Insufficient documentation

## 2015-01-25 DIAGNOSIS — Z862 Personal history of diseases of the blood and blood-forming organs and certain disorders involving the immune mechanism: Secondary | ICD-10-CM | POA: Insufficient documentation

## 2015-01-25 DIAGNOSIS — N764 Abscess of vulva: Secondary | ICD-10-CM | POA: Insufficient documentation

## 2015-01-25 DIAGNOSIS — Z8571 Personal history of Hodgkin lymphoma: Secondary | ICD-10-CM | POA: Insufficient documentation

## 2015-01-25 DIAGNOSIS — E119 Type 2 diabetes mellitus without complications: Secondary | ICD-10-CM | POA: Insufficient documentation

## 2015-01-25 DIAGNOSIS — E039 Hypothyroidism, unspecified: Secondary | ICD-10-CM | POA: Insufficient documentation

## 2015-01-25 DIAGNOSIS — L0231 Cutaneous abscess of buttock: Secondary | ICD-10-CM | POA: Diagnosis not present

## 2015-01-25 DIAGNOSIS — Z9221 Personal history of antineoplastic chemotherapy: Secondary | ICD-10-CM | POA: Diagnosis not present

## 2015-01-25 DIAGNOSIS — G43909 Migraine, unspecified, not intractable, without status migrainosus: Secondary | ICD-10-CM | POA: Insufficient documentation

## 2015-01-25 DIAGNOSIS — Z792 Long term (current) use of antibiotics: Secondary | ICD-10-CM | POA: Insufficient documentation

## 2015-01-25 DIAGNOSIS — L988 Other specified disorders of the skin and subcutaneous tissue: Secondary | ICD-10-CM | POA: Diagnosis present

## 2015-01-25 DIAGNOSIS — L0291 Cutaneous abscess, unspecified: Secondary | ICD-10-CM

## 2015-01-25 LAB — CBG MONITORING, ED: Glucose-Capillary: 392 mg/dL — ABNORMAL HIGH (ref 70–99)

## 2015-01-25 NOTE — ED Notes (Signed)
Pt has 3 abcesses, two on her buttocks that have ruptured and one on her groin area that has not. Alert and oriented.

## 2015-01-26 MED ORDER — DOXYCYCLINE HYCLATE 100 MG PO TABS
100.0000 mg | ORAL_TABLET | Freq: Once | ORAL | Status: AC
  Administered 2015-01-26: 100 mg via ORAL
  Filled 2015-01-26: qty 1

## 2015-01-26 MED ORDER — OXYCODONE-ACETAMINOPHEN 5-325 MG PO TABS
1.0000 | ORAL_TABLET | Freq: Once | ORAL | Status: AC
Start: 2015-01-26 — End: 2015-01-26
  Administered 2015-01-26: 1 via ORAL
  Filled 2015-01-26: qty 1

## 2015-01-26 MED ORDER — OXYCODONE-ACETAMINOPHEN 5-325 MG PO TABS: 1.0000 | ORAL_TABLET | ORAL | Status: DC | PRN

## 2015-01-26 MED ORDER — DOXYCYCLINE HYCLATE 100 MG PO CAPS: 100.0000 mg | ORAL_CAPSULE | Freq: Two times a day (BID) | ORAL | Status: DC

## 2015-01-26 NOTE — ED Provider Notes (Signed)
CSN: [id number]     Arrival date & time 01/25/15  2248 History   First MD Initiated Contact with Patient 01/26/15 0133     Chief Complaint  Patient presents with  . Skin Problem     (Consider location/radiation/quality/duration/timing/severity/associated sxs/prior Treatment) HPI Comments: Multiple abscesses starting one week ago to left buttock, right buttock and right labia. The two on the buttocks have been draining with the vaginal area nondraining. No fever.   Patient is a 34 y.o. female presenting with abscess. The history is provided by the patient. No language interpreter was used.  Abscess Location:  Ano-genital Ano-genital abscess location:  L buttock, R buttock and vagina Abscess quality: draining and painful   Duration:  1 day Associated symptoms: no fever and no nausea     Past Medical History  Diagnosis Date  . Hypertension   . Complication of anesthesia SLOW TO WAKE UP AFTER C SECTION  . Benign essential HTN 01/19/2012  . Eczema 01/19/2012  . Muscle spasm of back 03/09/2012  . S/P chemotherapy, time since 4-12 weeks 05/11/2012    4 cycles of ABVD with neulasta support and zoladex  . Cough, persistent 07/27/2012  . Leukopenia 08/01/2012  . Asthma     seasonal, worse in spring. Uses inhaler during this time of year  . Migraine   . DM II (diabetes mellitus, type II), controlled 01/19/2012  . Hodgkin lymphoma 01/18/2012    left axilla & left neck dx'd 01/16/2012  . Hypothyroidism     06/2014   Past Surgical History  Procedure Laterality Date  . Dilation and curettage of uterus    . Axillary lymph node biopsy  12/2011    left  . Portacath placement  01/25/2012    Procedure: INSERTION PORT-A-CATH;  Surgeon: Gayland Curry, MD,FACS;  Location: [facility];  Service: General;  Laterality: Right;  right subclavian  . Cesarean section  2008   Family History  Problem Relation Age of Onset  . Diabetes Maternal Grandmother   . Heart disease Maternal Grandfather   . Cancer Neg Hx    . Allergies Mother    History  Substance Use Topics  . Smoking status: Former Smoker -- 0.25 packs/day for 5 years    Types: Cigarettes    Quit date: 11/28/2002  . Smokeless tobacco: Never Used  . Alcohol Use: No   OB History    Gravida Para Term Preterm AB TAB SAB Ectopic Multiple Living   3 1 1  2  0 2 0 0 1     Review of Systems  Constitutional: Negative.  Negative for fever.  Gastrointestinal: Negative.  Negative for nausea.  Musculoskeletal: Negative.  Negative for myalgias.  Skin:       See HPI.      Allergies  Imitrex and Penicillins  Home Medications   Prior to Admission medications   Medication Sig Start Date End Date Taking? Authorizing Provider  albuterol (PROVENTIL HFA;VENTOLIN HFA) 108 (90 BASE) MCG/ACT inhaler Inhale 2 puffs into the lungs every 6 (six) hours as needed for wheezing or shortness of breath. 01/02/15  Yes Lupita Dawn, MD  albuterol-ipratropium (COMBIVENT) 18-103 MCG/ACT inhaler Inhale 2 puffs into the lungs every 6 (six) hours as needed for wheezing or shortness of breath. 10/17/14  Yes Alveda Reasons, MD  amLODipine (NORVASC) 10 MG tablet Take 10 mg by mouth daily.   Yes Historical Provider, MD  Boric Acid GRAN Apply 1 application topically at bedtime.   Yes Historical Provider,  MD  levothyroxine (SYNTHROID, LEVOTHROID) 100 MCG tablet Take 1 tablet (100 mcg total) by mouth daily. 12/30/14  Yes Alveda Reasons, MD  metFORMIN (GLUCOPHAGE) 500 MG tablet Take 1 tablet (500 mg total) by mouth 2 (two) times daily with a meal. 12/30/14  Yes Alveda Reasons, MD  metoprolol succinate (TOPROL-XL) 100 MG 24 hr tablet Take 100 mg by mouth daily. Take with or immediately following a meal.   Yes Historical Provider, MD  metroNIDAZOLE (FLAGYL) 500 MG tablet Take 1 tablet (500 mg total) by mouth 2 (two) times daily. 12/30/14  Yes Alveda Reasons, MD  miconazole (MONISTAT 7) 2 % vaginal cream Place 1 Applicatorful vaginally at bedtime.   Yes Historical Provider, MD   Probiotic Product (PROBIOTIC DAILY) CAPS Take 1 capsule by mouth daily.   Yes Historical Provider, MD  valsartan-hydrochlorothiazide (DIOVAN HCT) 160-25 MG per tablet Take 1 tablet by mouth daily. Patient not taking: Reported on 01/26/2015 10/16/14   Tanda Rockers, MD   BP 126/72 mmHg  Pulse 61  Temp(Src) 98.2 F (36.8 C) (Oral)  Resp 14  Ht 5\' 7"  (1.702 m)  Wt 230 lb (104.327 kg)  BMI 36.01 kg/m2  SpO2 99% Physical Exam  Constitutional: She is oriented to person, place, and time. She appears well-developed and well-nourished.  Neck: Normal range of motion.  Pulmonary/Chest: Effort normal.  Genitourinary:  Right labial abscess that is small, nondraining, moderately tender.   Neurological: She is alert and oriented to person, place, and time.  Skin: Skin is warm and dry.  Right buttock has a 3 cm diameter area of induration with central area of drainage c/w abscess. There is a lesion similar in size and appearance on the contralateral location of left buttock.     ED Course  Procedures (including critical care time) Labs Review Labs Reviewed  CBG MONITORING, ED - Abnormal; Notable for the following:    Glucose-Capillary 392 (*)    All other components within normal limits    Imaging Review No results found.   EKG Interpretation None      MDM   Final diagnoses:  None    1. Multiple abscesses  Discussed I&D of labial abscess, however, patient declines stating that she wants to try antibiotic therapy, warm compresses and PCP follow up as scheduled in 2 days for recheck.     Dewaine Oats, PA-C 01/26/15 0225  Kalman Drape, MD 01/26/15 [phone]

## 2015-01-26 NOTE — Discharge Instructions (Signed)
Abscess An abscess is an infected area that contains a collection of pus and debris.It can occur in almost any part of the body. An abscess is also known as a furuncle or boil. CAUSES  An abscess occurs when tissue gets infected. This can occur from blockage of oil or sweat glands, infection of hair follicles, or a minor injury to the skin. As the body tries to fight the infection, pus collects in the area and creates pressure under the skin. This pressure causes pain. People with weakened immune systems have difficulty fighting infections and get certain abscesses more often.  SYMPTOMS Usually an abscess develops on the skin and becomes a painful mass that is red, warm, and tender. If the abscess forms under the skin, you may feel a moveable soft area under the skin. Some abscesses break open (rupture) on their own, but most will continue to get worse without care. The infection can spread deeper into the body and eventually into the bloodstream, causing you to feel ill.  DIAGNOSIS  Your caregiver will take your medical history and perform a physical exam. A sample of fluid may also be taken from the abscess to determine what is causing your infection. TREATMENT  Your caregiver may prescribe antibiotic medicines to fight the infection. However, taking antibiotics alone usually does not cure an abscess. Your caregiver may need to make a small cut (incision) in the abscess to drain the pus. In some cases, gauze is packed into the abscess to reduce pain and to continue draining the area. HOME CARE INSTRUCTIONS   Only take over-the-counter or prescription medicines for pain, discomfort, or fever as directed by your caregiver.  If you were prescribed antibiotics, take them as directed. Finish them even if you start to feel better.  If gauze is used, follow your caregiver's directions for changing the gauze.  To avoid spreading the infection:  Keep your draining abscess covered with a  bandage.  Wash your hands well.  Do not share personal care items, towels, or whirlpools with others.  Avoid skin contact with others.  Keep your skin and clothes clean around the abscess.  Keep all follow-up appointments as directed by your caregiver. SEEK MEDICAL CARE IF:   You have increased pain, swelling, redness, fluid drainage, or bleeding.  You have muscle aches, chills, or a general ill feeling.  You have a fever. MAKE SURE YOU:   Understand these instructions.  Will watch your condition.  Will get help right away if you are not doing well or get worse. Document Released: 08/24/2005 Document Revised: 05/15/2012 Document Reviewed: 01/27/2012 [facility] Patient Information 2015 [place], [place]. This information is not intended to replace advice given to you by your health care provider. Make sure you discuss any questions you have with your health care provider. Heat Therapy Heat therapy can help ease sore, stiff, injured, and tight muscles and joints. Heat relaxes your muscles, which may help ease your pain.  RISKS AND COMPLICATIONS If you have any of the following conditions, do not use heat therapy unless your health care provider has approved:  Poor circulation.  Healing wounds or scarred skin in the area being treated.  Diabetes, heart disease, or high blood pressure.  Not being able to feel (numbness) the area being treated.  Unusual swelling of the area being treated.  Active infections.  Blood clots.  Cancer.  Inability to communicate pain. This may include young children and people who have problems with their brain function (dementia).  Pregnancy.  Heat therapy should only be used on old, pre-existing, or long-lasting (chronic) injuries. Do not use heat therapy on new injuries unless directed by your health care provider. HOW TO USE HEAT THERAPY There are several different kinds of heat therapy, including:  Moist heat pack.  Warm water  bath.  Hot water bottle.  Electric heating pad.  Heated gel pack.  Heated wrap.  Electric heating pad. Use the heat therapy method suggested by your health care provider. Follow your health care provider's instructions on when and how to use heat therapy. GENERAL HEAT THERAPY RECOMMENDATIONS  Do not sleep while using heat therapy. Only use heat therapy while you are awake.  Your skin may turn pink while using heat therapy. Do not use heat therapy if your skin turns red.  Do not use heat therapy if you have new pain.  High heat or long exposure to heat can cause burns. Be careful when using heat therapy to avoid burning your skin.  Do not use heat therapy on areas of your skin that are already irritated, such as with a rash or sunburn. SEEK MEDICAL CARE IF:  You have blisters, redness, swelling, or numbness.  You have new pain.  Your pain is worse. MAKE SURE YOU:  Understand these instructions.  Will watch your condition.  Will get help right away if you are not doing well or get worse. Document Released: 02/06/2012 Document Revised: 03/31/2014 Document Reviewed: 01/07/2014 [facility] Patient Information 2015 [place], [place]. This information is not intended to replace advice given to you by your health care provider. Make sure you discuss any questions you have with your health care provider.

## 2015-01-27 ENCOUNTER — Encounter: Payer: Self-pay | Admitting: Family Medicine

## 2015-01-27 ENCOUNTER — Ambulatory Visit (INDEPENDENT_AMBULATORY_CARE_PROVIDER_SITE_OTHER): Payer: Medicaid Other | Admitting: Family Medicine

## 2015-01-27 VITALS — BP 148/107 | HR 86 | Temp 98.2°F | Ht 67.0 in | Wt 239.1 lb

## 2015-01-27 DIAGNOSIS — L0232 Furuncle of buttock: Secondary | ICD-10-CM | POA: Diagnosis not present

## 2015-01-27 DIAGNOSIS — E119 Type 2 diabetes mellitus without complications: Secondary | ICD-10-CM

## 2015-01-27 DIAGNOSIS — N898 Other specified noninflammatory disorders of vagina: Secondary | ICD-10-CM | POA: Diagnosis not present

## 2015-01-27 DIAGNOSIS — E039 Hypothyroidism, unspecified: Secondary | ICD-10-CM | POA: Diagnosis not present

## 2015-01-27 LAB — POCT WET PREP (WET MOUNT): CLUE CELLS WET PREP WHIFF POC: NEGATIVE

## 2015-01-27 LAB — BASIC METABOLIC PANEL
BUN: 13 mg/dL (ref 6–23)
CHLORIDE: 98 meq/L (ref 96–112)
CO2: 22 mEq/L (ref 19–32)
CREATININE: 0.89 mg/dL (ref 0.50–1.10)
Calcium: 9.8 mg/dL (ref 8.4–10.5)
Glucose, Bld: 457 mg/dL — ABNORMAL HIGH (ref 70–99)
Potassium: 4.6 mEq/L (ref 3.5–5.3)
Sodium: 133 mEq/L — ABNORMAL LOW (ref 135–145)

## 2015-01-27 LAB — TSH: TSH: 10.923 u[IU]/mL — AB (ref 0.350–4.500)

## 2015-01-27 MED ORDER — FLUCONAZOLE 150 MG PO TABS: 150.0000 mg | ORAL_TABLET | Freq: Once | ORAL | Status: DC

## 2015-01-27 MED ORDER — METFORMIN HCL 850 MG PO TABS: 850.0000 mg | ORAL_TABLET | Freq: Two times a day (BID) | ORAL | Status: DC

## 2015-01-27 MED ORDER — GLIPIZIDE 5 MG PO TABS: 5.0000 mg | ORAL_TABLET | Freq: Two times a day (BID) | ORAL | Status: DC

## 2015-01-27 NOTE — Patient Instructions (Addendum)
I think that your high blood sugars are what's causing the infections and discharge.  Continue doing what you are doing regarding exercise and diet.  I will refer you to a nutritionist.    Start the new dose of Metformin 850 mg twice daily.  Start the Glipizide 5 mg twice a day.  This is a low dose and we will probably need to go up.  Come back and see me in 1 month, after you've seen the nutritionist.  [facility] check your blood sugars then again.   I'll let you know if the swab is something other than yeast.  Take 1 tab of Diflucan today.  Take the other after you have finished the antibiotics.

## 2015-01-27 NOTE — Progress Notes (Signed)
Subjective:    [name] is a 34 y.o. female who presents to [facility] today for recent ED visit for vaginal abscesses:  1.  Vaginal abscesses:  Started several days ago.  Seen in ED.  Recommended lancing but she was nervous about this.  Started on Doxy.  Buttocks ones have improved.  Labial abscess is draining spontaneously.  Improving,  No fevers.  Defers GU exam.   2.  Diabetes:  Currently on Metformin 500 mg BID.    No adverse effects from medication.  No hypoglycemic events.  No paresthesia or peripheral nerve pain.  Works out with Clinical research associate for exercise.  Drinks mostly smoothies. Lab Results  Component Value Date   HGBA1C 13.5 12/30/2014   3.  Vaginal discharge:  Started about 2 weeks ago.  Has tried OTC boric acid and miconazole without much relief.  No longer itching.  Thick white discharge.    Prev. Care:  Overdue for Pap smear.    ROS as above per HPI, otherwise neg.    The following portions of the patient's history were reviewed and updated as appropriate: allergies, current medications, past medical history, family and social history, and problem list.    PMH reviewed.  Past Medical History  Diagnosis Date  . Hypertension   . Complication of anesthesia SLOW TO WAKE UP AFTER C SECTION  . Benign essential HTN 01/19/2012  . Eczema 01/19/2012  . Muscle spasm of back 03/09/2012  . S/P chemotherapy, time since 4-12 weeks 05/11/2012    4 cycles of ABVD with neulasta support and zoladex  . Cough, persistent 07/27/2012  . Leukopenia 08/01/2012  . Asthma     seasonal, worse in spring. Uses inhaler during this time of year  . Migraine   . DM II (diabetes mellitus, type II), controlled 01/19/2012  . Hodgkin lymphoma 01/18/2012    left axilla & left neck dx'd 01/16/2012  . Hypothyroidism     06/2014   Past Surgical History  Procedure Laterality Date  . Dilation and curettage of uterus    . Axillary lymph node biopsy  12/2011    left  . Portacath placement  01/25/2012    Procedure:  INSERTION PORT-A-CATH;  Surgeon: Gayland Curry, MD,FACS;  Location: [facility] ORS;  Service: General;  Laterality: Right;  right subclavian  . Cesarean section  2008    Medications reviewed. Current Outpatient Prescriptions  Medication Sig Dispense Refill  . albuterol (PROVENTIL HFA;VENTOLIN HFA) 108 (90 BASE) MCG/ACT inhaler Inhale 2 puffs into the lungs every 6 (six) hours as needed for wheezing or shortness of breath. 1 Inhaler 1  . albuterol-ipratropium (COMBIVENT) 18-103 MCG/ACT inhaler Inhale 2 puffs into the lungs every 6 (six) hours as needed for wheezing or shortness of breath. 14.7 g 1  . amLODipine (NORVASC) 10 MG tablet Take 10 mg by mouth daily.    . Boric Acid GRAN Apply 1 application topically at bedtime.    Marland Kitchen doxycycline (VIBRAMYCIN) 100 MG capsule Take 1 capsule (100 mg total) by mouth 2 (two) times daily. 20 capsule 0  . levothyroxine (SYNTHROID, LEVOTHROID) 100 MCG tablet Take 1 tablet (100 mcg total) by mouth daily. 30 tablet 3  . metFORMIN (GLUCOPHAGE) 500 MG tablet Take 1 tablet (500 mg total) by mouth 2 (two) times daily with a meal. 60 tablet 6  . metoprolol succinate (TOPROL-XL) 100 MG 24 hr tablet Take 100 mg by mouth daily. Take with or immediately following a meal.    . metroNIDAZOLE (FLAGYL) 500  MG tablet Take 1 tablet (500 mg total) by mouth 2 (two) times daily. 14 tablet 0  . miconazole (MONISTAT 7) 2 % vaginal cream Place 1 Applicatorful vaginally at bedtime.    Marland Kitchen oxyCODONE-acetaminophen (PERCOCET/ROXICET) 5-325 MG per tablet Take 1-2 tablets by mouth every 4 (four) hours as needed for severe pain. 15 tablet 0  . Probiotic Product (PROBIOTIC DAILY) CAPS Take 1 capsule by mouth daily.    . valsartan-hydrochlorothiazide (DIOVAN HCT) 160-25 MG per tablet Take 1 tablet by mouth daily. (Patient not taking: Reported on 01/26/2015) 30 tablet 11   No current facility-administered medications for this visit.     Objective:   Physical Exam BP 148/107 mmHg  Pulse 86   Temp(Src) 98.2 F (36.8 C) (Oral)  Ht 5\' 7"  (1.702 m)  Wt 239 lb 1.6 oz (108.455 kg)  BMI 37.44 kg/m2 Gen:  Alert, cooperative patient who appears stated age in no acute distress.  Vital signs reviewed. HEENT: EOMI,  MMM Cardiac:  Regular rate and rhythm without murmur auscultated.  Good S1/S2. Pulm:  Clear to auscultation bilaterally with good air movement.  No wheezes or rales noted.   GU:  Deferred.    Results for orders placed or performed during the hospital encounter of 01/25/15 (from the past 72 hour(s))  CBG monitoring, ED     Status: Abnormal   Collection Time: 01/25/15 10:55 PM  Result Value Ref Range   Glucose-Capillary 392 (H) 70 - 99 mg/dL   Comment 1 Notify RN    Comment 2 Document in Chart

## 2015-01-28 ENCOUNTER — Telehealth: Payer: Self-pay | Admitting: Family Medicine

## 2015-01-28 DIAGNOSIS — N898 Other specified noninflammatory disorders of vagina: Secondary | ICD-10-CM | POA: Insufficient documentation

## 2015-01-28 DIAGNOSIS — L0232 Furuncle of buttock: Secondary | ICD-10-CM | POA: Insufficient documentation

## 2015-01-28 NOTE — Assessment & Plan Note (Signed)
LIkely yeast. She doesn't want to wait for wet prep. Treat presumptively and change if wet prep is different.

## 2015-01-28 NOTE — Assessment & Plan Note (Signed)
Rechecking TSH today

## 2015-01-28 NOTE — Assessment & Plan Note (Signed)
No longer controlled.   Adding glipizide, though I'm unsure how much this will improve her sugars.   Increasing Metformin as well.   She will need to come back soon to ensure that she is not continuing to worsen from diabetic standpoint. Will call with results.  May need to have her schedule quickly in pharm clinic to start titration of insulin.

## 2015-01-28 NOTE — Assessment & Plan Note (Signed)
Treated appropriately with doxy. Improving. Warning precautions provided.

## 2015-01-28 NOTE — Telephone Encounter (Signed)
Called and spoke with [name].  Discussed the following:  1. (+) yeast.  To continue her diflucan.  If not resolved (she is currently on abx) with 2nd dose, she is to call and I'll send more in for her 2. TSH of 10.  Increasing her synthroid to 125 mcg.  She has 7 pills left of 100 -- she can finish this and then pick up new script.  3.  CBG of 457.  Biggest issue.  Discussed with her.  Still denies symptoms, but she drinks LOTS of water in an effort to lose weight.  This could be masking thirst.  Grandmother has meter at home.  She is to check her CBG this weekend for at least 2 days in a room first thing in the AM. If still persistently elevated (not sure how much Glipizide 5 BID is going to help things), discussed need for pharm clinic appt next week. I have already broached the topic of insulin with her -- she is VERY hesitant about this.  Has already taken glipizide this AM, no signs of hypoglycemia.   Appreciative of call.

## 2015-01-28 NOTE — Telephone Encounter (Signed)
Was able to discuss with her in person.

## 2015-02-04 ENCOUNTER — Ambulatory Visit
Admission: RE | Admit: 2015-02-04 | Discharge: 2015-02-04 | Disposition: A | Discharge status: Home / Self Care | Payer: Medicaid Other | Source: Ambulatory Visit | Attending: Hematology and Oncology | Admitting: Hematology and Oncology

## 2015-02-04 DIAGNOSIS — C8190 Hodgkin lymphoma, unspecified, unspecified site: Secondary | ICD-10-CM

## 2015-02-05 ENCOUNTER — Encounter: Payer: Self-pay | Admitting: Hematology and Oncology

## 2015-02-05 ENCOUNTER — Other Ambulatory Visit (HOSPITAL_BASED_OUTPATIENT_CLINIC_OR_DEPARTMENT_OTHER): Payer: Medicaid Other

## 2015-02-05 ENCOUNTER — Telehealth: Payer: Self-pay | Admitting: *Deleted

## 2015-02-05 ENCOUNTER — Telehealth: Payer: Self-pay | Admitting: Hematology and Oncology

## 2015-02-05 ENCOUNTER — Ambulatory Visit (HOSPITAL_COMMUNITY)
Admission: RE | Admit: 2015-02-05 | Discharge: 2015-02-05 | Disposition: A | Discharge status: Home / Self Care | Payer: Medicaid Other | Source: Ambulatory Visit | Attending: Hematology and Oncology | Admitting: Hematology and Oncology

## 2015-02-05 ENCOUNTER — Ambulatory Visit (HOSPITAL_BASED_OUTPATIENT_CLINIC_OR_DEPARTMENT_OTHER): Payer: Medicaid Other | Admitting: Hematology and Oncology

## 2015-02-05 VITALS — BP 138/88 | HR 74 | Temp 98.1°F | Resp 20 | Ht 67.0 in | Wt 240.7 lb

## 2015-02-05 DIAGNOSIS — E038 Other specified hypothyroidism: Secondary | ICD-10-CM

## 2015-02-05 DIAGNOSIS — Z299 Encounter for prophylactic measures, unspecified: Secondary | ICD-10-CM

## 2015-02-05 DIAGNOSIS — D72819 Decreased white blood cell count, unspecified: Secondary | ICD-10-CM

## 2015-02-05 DIAGNOSIS — C8190 Hodgkin lymphoma, unspecified, unspecified site: Secondary | ICD-10-CM | POA: Diagnosis present

## 2015-02-05 DIAGNOSIS — E119 Type 2 diabetes mellitus without complications: Secondary | ICD-10-CM

## 2015-02-05 LAB — COMPREHENSIVE METABOLIC PANEL (CC13)
ALBUMIN: 3.6 g/dL (ref 3.5–5.0)
ALK PHOS: 58 U/L (ref 40–150)
ALT: 23 U/L (ref 0–55)
AST: 23 U/L (ref 5–34)
Anion Gap: 11 mEq/L (ref 3–11)
BUN: 11.9 mg/dL (ref 7.0–26.0)
CO2: 25 mEq/L (ref 22–29)
CREATININE: 1 mg/dL (ref 0.6–1.1)
Calcium: 9 mg/dL (ref 8.4–10.4)
Chloride: 102 mEq/L (ref 98–109)
EGFR: 84 mL/min/{1.73_m2} — ABNORMAL LOW (ref >90)
GLUCOSE: 402 mg/dL — AB (ref 70–140)
Potassium: 4.8 mEq/L (ref 3.5–5.1)
Sodium: 137 mEq/L (ref 136–145)
TOTAL PROTEIN: 7 g/dL (ref 6.4–8.3)
Total Bilirubin: 0.62 mg/dL (ref 0.20–1.20)

## 2015-02-05 LAB — CBC WITH DIFFERENTIAL/PLATELET
BASO%: 0.3 % (ref 0.0–2.0)
BASOS ABS: 0 10*3/uL (ref 0.0–0.1)
EOS ABS: 0.2 10*3/uL (ref 0.0–0.5)
EOS%: 4.7 % (ref 0.0–7.0)
HEMATOCRIT: 40.1 % (ref 34.8–46.6)
HGB: 13.6 g/dL (ref 11.6–15.9)
LYMPH%: 35.8 % (ref 14.0–49.7)
MCH: 31.4 pg (ref 25.1–34.0)
MCHC: 33.9 g/dL (ref 31.5–36.0)
MCV: 92.6 fL (ref 79.5–101.0)
MONO#: 0.4 10*3/uL (ref 0.1–0.9)
MONO%: 10.9 % (ref 0.0–14.0)
NEUT#: 1.6 10*3/uL (ref 1.5–6.5)
NEUT%: 48.3 % (ref 38.4–76.8)
Platelets: 195 10*3/uL (ref 145–400)
RBC: 4.33 10*6/uL (ref 3.70–5.45)
RDW: 12.3 % (ref 11.2–14.5)
WBC: 3.4 10*3/uL — AB (ref 3.9–10.3)
lymph#: 1.2 10*3/uL (ref 0.9–3.3)

## 2015-02-05 LAB — SEDIMENTATION RATE: SED RATE: 15 mm/h (ref 0–20)

## 2015-02-05 NOTE — Assessment & Plan Note (Signed)
She is on close monitoring with PCP and is on medication. She is attempting to exercise and lose weight before her wedding in September.

## 2015-02-05 NOTE — Assessment & Plan Note (Signed)
This is likely due to prior treatment. The patient denies recent history of fevers, cough, chills, diarrhea or dysuria. She is asymptomatic from the leukopenia. I will observe for now.

## 2015-02-05 NOTE — Telephone Encounter (Signed)
gv and printed appt sched and avs for pt for March 2017

## 2015-02-05 NOTE — Telephone Encounter (Signed)
[place] called to request NPI number.  NPI number given.  Derl Barrow, RN

## 2015-02-05 NOTE — Progress Notes (Signed)
[place] OFFICE PROGRESS NOTE  Patient Care Team: Alveda Reasons, MD as PCP - General (Family Medicine)  SUMMARY OF ONCOLOGIC HISTORY: Oncology History   Hodgkin's lymphoma   Primary site: Lymphoid Neoplasms (Left)   Staging method: AJCC 6th Edition   Clinical: Stage II   Summary: Stage II       Hodgkin's lymphoma   01/11/2012 Procedure [id number] Left axillary LN biopsy showed Hodgkin lymphoma   01/24/2012 Imaging Ct scan of chest, abdomen and pelvis & PET scan showed bulky left axillary LN, supraclavicular lymphadenoapthy and medistinal involvement   02/03/2012 - 05/11/2012 Chemotherapy She completed 4 cycles of ABVD   05/28/2012 Imaging PET scan showed partial response   06/18/2012 - 07/16/2012 Radiation Therapy She completed radiation treatment   07/27/2012 Imaging CT scan of chest showed pneumonia   03/28/2013 Imaging Ct chest showed no recurrence   04/15/2013 Imaging Ct angiogram showed pneumonia   06/04/2013 Imaging PET scan showed complete response   08/06/2013 Imaging Ct scan showed no recurrence   02/04/2014 Imaging Ct chest showed no recurrence   07/31/2014 Imaging Ct chest showed no recurrence    INTERVAL HISTORY: Please see below for problem oriented charting. She is seen for further follow-up. She denies new lymphadenopathy. She is attempting to lose weight and was started on multiple medications for diabetes and hypothyroidism. She is getting married in 6 months and is busy preparing for her wedding. Her port was removed after her recent visit.  REVIEW OF SYSTEMS:   Constitutional: Denies fevers, chills or abnormal weight loss Eyes: Denies blurriness of vision Ears, nose, mouth, throat, and face: Denies mucositis or sore throat Respiratory: Denies cough, dyspnea or wheezes Cardiovascular: Denies palpitation, chest discomfort or lower extremity swelling Gastrointestinal:  Denies nausea, heartburn or change in bowel habits Skin: Denies abnormal skin  rashes Lymphatics: Denies new lymphadenopathy or easy bruising Neurological:Denies numbness, tingling or new weaknesses Behavioral/Psych: Mood is stable, no new changes  All other systems were reviewed with the patient and are negative.  I have reviewed the past medical history, past surgical history, social history and family history with the patient and they are unchanged from previous note.  ALLERGIES:  is allergic to imitrex and penicillins.  MEDICATIONS:  Current Outpatient Prescriptions  Medication Sig Dispense Refill  . albuterol (PROVENTIL HFA;VENTOLIN HFA) 108 (90 BASE) MCG/ACT inhaler Inhale 2 puffs into the lungs every 6 (six) hours as needed for wheezing or shortness of breath. 1 Inhaler 1  . albuterol-ipratropium (COMBIVENT) 18-103 MCG/ACT inhaler Inhale 2 puffs into the lungs every 6 (six) hours as needed for wheezing or shortness of breath. 14.7 g 1  . amLODipine (NORVASC) 10 MG tablet Take 10 mg by mouth daily.    . Boric Acid GRAN Apply 1 application topically at bedtime.    Marland Kitchen doxycycline (VIBRAMYCIN) 100 MG capsule Take 1 capsule (100 mg total) by mouth 2 (two) times daily. 20 capsule 0  . fluconazole (DIFLUCAN) 150 MG tablet Take 1 tablet (150 mg total) by mouth once. 2 tablet 0  . glipiZIDE (GLUCOTROL) 5 MG tablet Take 1 tablet (5 mg total) by mouth 2 (two) times daily before a meal. 60 tablet 1  . levothyroxine (SYNTHROID, LEVOTHROID) 100 MCG tablet Take 1 tablet (100 mcg total) by mouth daily. 30 tablet 3  . metFORMIN (GLUCOPHAGE) 850 MG tablet Take 1 tablet (850 mg total) by mouth 2 (two) times daily with a meal. 60 tablet 1  . metoprolol succinate (TOPROL-XL) 100 MG  24 hr tablet Take 100 mg by mouth daily. Take with or immediately following a meal.    . miconazole (MONISTAT 7) 2 % vaginal cream Place 1 Applicatorful vaginally at bedtime.    Marland Kitchen oxyCODONE-acetaminophen (PERCOCET/ROXICET) 5-325 MG per tablet Take 1-2 tablets by mouth every 4 (four) hours as needed for  severe pain. 15 tablet 0  . Probiotic Product (PROBIOTIC DAILY) CAPS Take 1 capsule by mouth daily.    . valsartan-hydrochlorothiazide (DIOVAN HCT) 160-25 MG per tablet Take 1 tablet by mouth daily. 30 tablet 11   No current facility-administered medications for this visit.    PHYSICAL EXAMINATION: ECOG PERFORMANCE STATUS: 0 - Asymptomatic  Filed Vitals:   02/05/15 0934  BP: 138/88  Pulse: 74  Temp: 98.1 F (36.7 C)  Resp: 20   Filed Weights   02/05/15 0934  Weight: 240 lb 11.2 oz (109.181 kg)    GENERAL:alert, no distress and comfortable. She is morbidly obese SKIN: skin color, texture, turgor are normal, no rashes or significant lesions EYES: normal, Conjunctiva are pink and non-injected, sclera clear OROPHARYNX:no exudate, no erythema and lips, buccal mucosa, and tongue normal  NECK: supple, thyroid normal size, non-tender, without nodularity LYMPH:  no palpable lymphadenopathy in the cervical, axillary or inguinal LUNGS: clear to auscultation and percussion with normal breathing effort HEART: regular rate & rhythm and no murmurs and no lower extremity edema ABDOMEN:abdomen soft, non-tender and normal bowel sounds Musculoskeletal:no cyanosis of digits and no clubbing  NEURO: alert & oriented x 3 with fluent speech, no focal motor/sensory deficits  LABORATORY DATA:  I have reviewed the data as listed    Component Value Date/Time   NA 133* 01/27/2015 1200   NA 139 07/31/2014 0905   NA 139 05/28/2012 0818   K 4.6 01/27/2015 1200   K 4.3 07/31/2014 0905   K 4.6 05/28/2012 0818   CL 98 01/27/2015 1200   CL 106 03/28/2013 0954   CL 95* 05/28/2012 0818   CO2 22 01/27/2015 1200   CO2 25 07/31/2014 0905   CO2 28 05/28/2012 0818   GLUCOSE 457* 01/27/2015 1200   GLUCOSE 165* 07/31/2014 0905   GLUCOSE 109* 03/28/2013 0954   GLUCOSE 153* 05/28/2012 0818   BUN 13 01/27/2015 1200   BUN 14.0 07/31/2014 0905   BUN 11 05/28/2012 0818   CREATININE 0.89 01/27/2015 1200    CREATININE 0.7 10/16/2014 1157   CREATININE 0.9 07/31/2014 0905   CREATININE 0.67 10/08/2007 1250   CALCIUM 9.8 01/27/2015 1200   CALCIUM 8.9 07/31/2014 0905   CALCIUM 9.3 05/28/2012 0818   PROT 7.2 07/31/2014 0905   PROT 6.7 12/03/2013 0954   PROT 7.3 05/28/2012 0818   ALBUMIN 3.7 07/31/2014 0905   ALBUMIN 4.2 12/03/2013 0954   AST 13 07/31/2014 0905   AST 14 12/03/2013 0954   AST 24 05/28/2012 0818   ALT 22 07/31/2014 0905   ALT 16 12/03/2013 0954   ALT 25 05/28/2012 0818   ALKPHOS 60 07/31/2014 0905   ALKPHOS 56 12/03/2013 0954   ALKPHOS 71 05/28/2012 0818   BILITOT 0.30 07/31/2014 0905   BILITOT 0.5 12/03/2013 0954   BILITOT 0.60 05/28/2012 0818   GFRNONAA >90 04/15/2013 1230   GFRAA >90 04/15/2013 1230    No results found for: SPEP, UPEP  Lab Results  Component Value Date   WBC 3.4* 02/05/2015   NEUTROABS 1.6 02/05/2015   HGB 13.6 02/05/2015   HCT 40.1 02/05/2015   MCV 92.6 02/05/2015   PLT 195  02/05/2015      Chemistry      Component Value Date/Time   NA 133* 01/27/2015 1200   NA 139 07/31/2014 0905   NA 139 05/28/2012 0818   K 4.6 01/27/2015 1200   K 4.3 07/31/2014 0905   K 4.6 05/28/2012 0818   CL 98 01/27/2015 1200   CL 106 03/28/2013 0954   CL 95* 05/28/2012 0818   CO2 22 01/27/2015 1200   CO2 25 07/31/2014 0905   CO2 28 05/28/2012 0818   BUN 13 01/27/2015 1200   BUN 14.0 07/31/2014 0905   BUN 11 05/28/2012 0818   CREATININE 0.89 01/27/2015 1200   CREATININE 0.7 10/16/2014 1157   CREATININE 0.9 07/31/2014 0905   CREATININE 0.67 10/08/2007 1250      Component Value Date/Time   CALCIUM 9.8 01/27/2015 1200   CALCIUM 8.9 07/31/2014 0905   CALCIUM 9.3 05/28/2012 0818   ALKPHOS 60 07/31/2014 0905   ALKPHOS 56 12/03/2013 0954   ALKPHOS 71 05/28/2012 0818   AST 13 07/31/2014 0905   AST 14 12/03/2013 0954   AST 24 05/28/2012 0818   ALT 22 07/31/2014 0905   ALT 16 12/03/2013 0954   ALT 25 05/28/2012 0818   BILITOT 0.30 07/31/2014 0905    BILITOT 0.5 12/03/2013 0954   BILITOT 0.60 05/28/2012 0818       RADIOGRAPHIC STUDIES: I have personally reviewed the radiological images as listed and agreed with the findings in the report. Mm Digital Screening Bilateral  02/04/2015   CLINICAL DATA:  Screening.  EXAM: DIGITAL SCREENING BILATERAL MAMMOGRAM WITH CAD  COMPARISON:  Previous exam(s).  ACR Breast Density Category b: There are scattered areas of fibroglandular density.  FINDINGS: There are no findings suspicious for malignancy. Images were processed with CAD.  IMPRESSION: No mammographic evidence of malignancy. A result letter of this screening mammogram will be mailed directly to the patient.  RECOMMENDATION: Screening mammogram at age 69. (Code:SM-B-40A)  BI-RADS CATEGORY  1: Negative.   Electronically Signed   By: Altamese Cabal M.D.   On: 02/04/2015 15:27     ASSESSMENT & PLAN:  Hodgkin's lymphoma She has completed all treatment and recent imaging showed no recurrence. I will see her in 12 months with repeat labs, CXR and examination     Leukopenia This is likely due to prior treatment. The patient denies recent history of fevers, cough, chills, diarrhea or dysuria. She is asymptomatic from the leukopenia. I will observe for now.     DM II (diabetes mellitus, type II), controlled She is on close monitoring with PCP and is on medication. She is attempting to exercise and lose weight before her wedding in September.   Other specified hypothyroidism This is likely related to post radiation therapy induced hypothyroidism. She is on medication for this and her primary care provider is watching it closely.   Preventive measure We discussed the importance of preventive care and reviewed the vaccination programs. She does not have any prior allergic reactions to influenza vaccination. She declined influenza vaccination but did receive recent pneumococcal vaccination. Due to exposure to radiation, she would need early  screening mammogram due to high risk development of breast cancer. Recent mammogram was negative.     Orders Placed This Encounter  Procedures  . DG Chest 2 View    Standing Status: Future     Number of Occurrences:      Standing Expiration Date: 03/11/2016    Order Specific Question:  Reason for exam:  Answer:  lymphoma, exclude recurrence    Order Specific Question:  Preferred imaging location?    Answer:  [facility]  . CBC with Differential/Platelet    Standing Status: Future     Number of Occurrences:      Standing Expiration Date: 03/11/2016  . Comprehensive metabolic panel    Standing Status: Future     Number of Occurrences:      Standing Expiration Date: 03/11/2016  . Lactate dehydrogenase    Standing Status: Future     Number of Occurrences:      Standing Expiration Date: 03/11/2016   All questions were answered. The patient knows to call the clinic with any problems, questions or concerns. No barriers to learning was detected. I spent 15 minutes counseling the patient face to face. The total time spent in the appointment was 20 minutes and more than 50% was on counseling and review of test results     [facility], Tanav Orsak, MD 02/05/2015 10:02 AM

## 2015-02-05 NOTE — Assessment & Plan Note (Signed)
We discussed the importance of preventive care and reviewed the vaccination programs. She does not have any prior allergic reactions to influenza vaccination. She declined influenza vaccination but did receive recent pneumococcal vaccination. Due to exposure to radiation, she would need early screening mammogram due to high risk development of breast cancer. Recent mammogram was negative.

## 2015-02-05 NOTE — Assessment & Plan Note (Signed)
This is likely related to post radiation therapy induced hypothyroidism. She is on medication for this and her primary care provider is watching it closely.

## 2015-02-05 NOTE — Assessment & Plan Note (Signed)
She has completed all treatment and recent imaging showed no recurrence. I will see her in 12 months with repeat labs, CXR and examination

## 2015-02-11 ENCOUNTER — Other Ambulatory Visit: Payer: Self-pay | Admitting: Family Medicine

## 2015-02-11 MED ORDER — FLUCONAZOLE 150 MG PO TABS: 150.0000 mg | ORAL_TABLET | Freq: Once | ORAL | Status: DC

## 2015-02-11 NOTE — Telephone Encounter (Signed)
Was told at last appointment with Dr. Mingo Amber that medication given might not have worked along with antibiotic and was told to call back after she came off of the antibiotic if her yeast infection was not yet resolved. She is calling to get the refill on Diflucan since she is now off of her antibiotic / thanks / Fonda Kinder, ASA

## 2015-03-21 IMAGING — CR DG CHEST 2V
2 series · 2 of 2 positions shown · non-contrast
Comparison: 12/04/2013

CLINICAL DATA: Dyspnea, Hodgkin's lymphoma with radiation treatment
2 years ago, chronic chest pain and cough

EXAM:
CHEST  2 VIEW

[w chest pa]
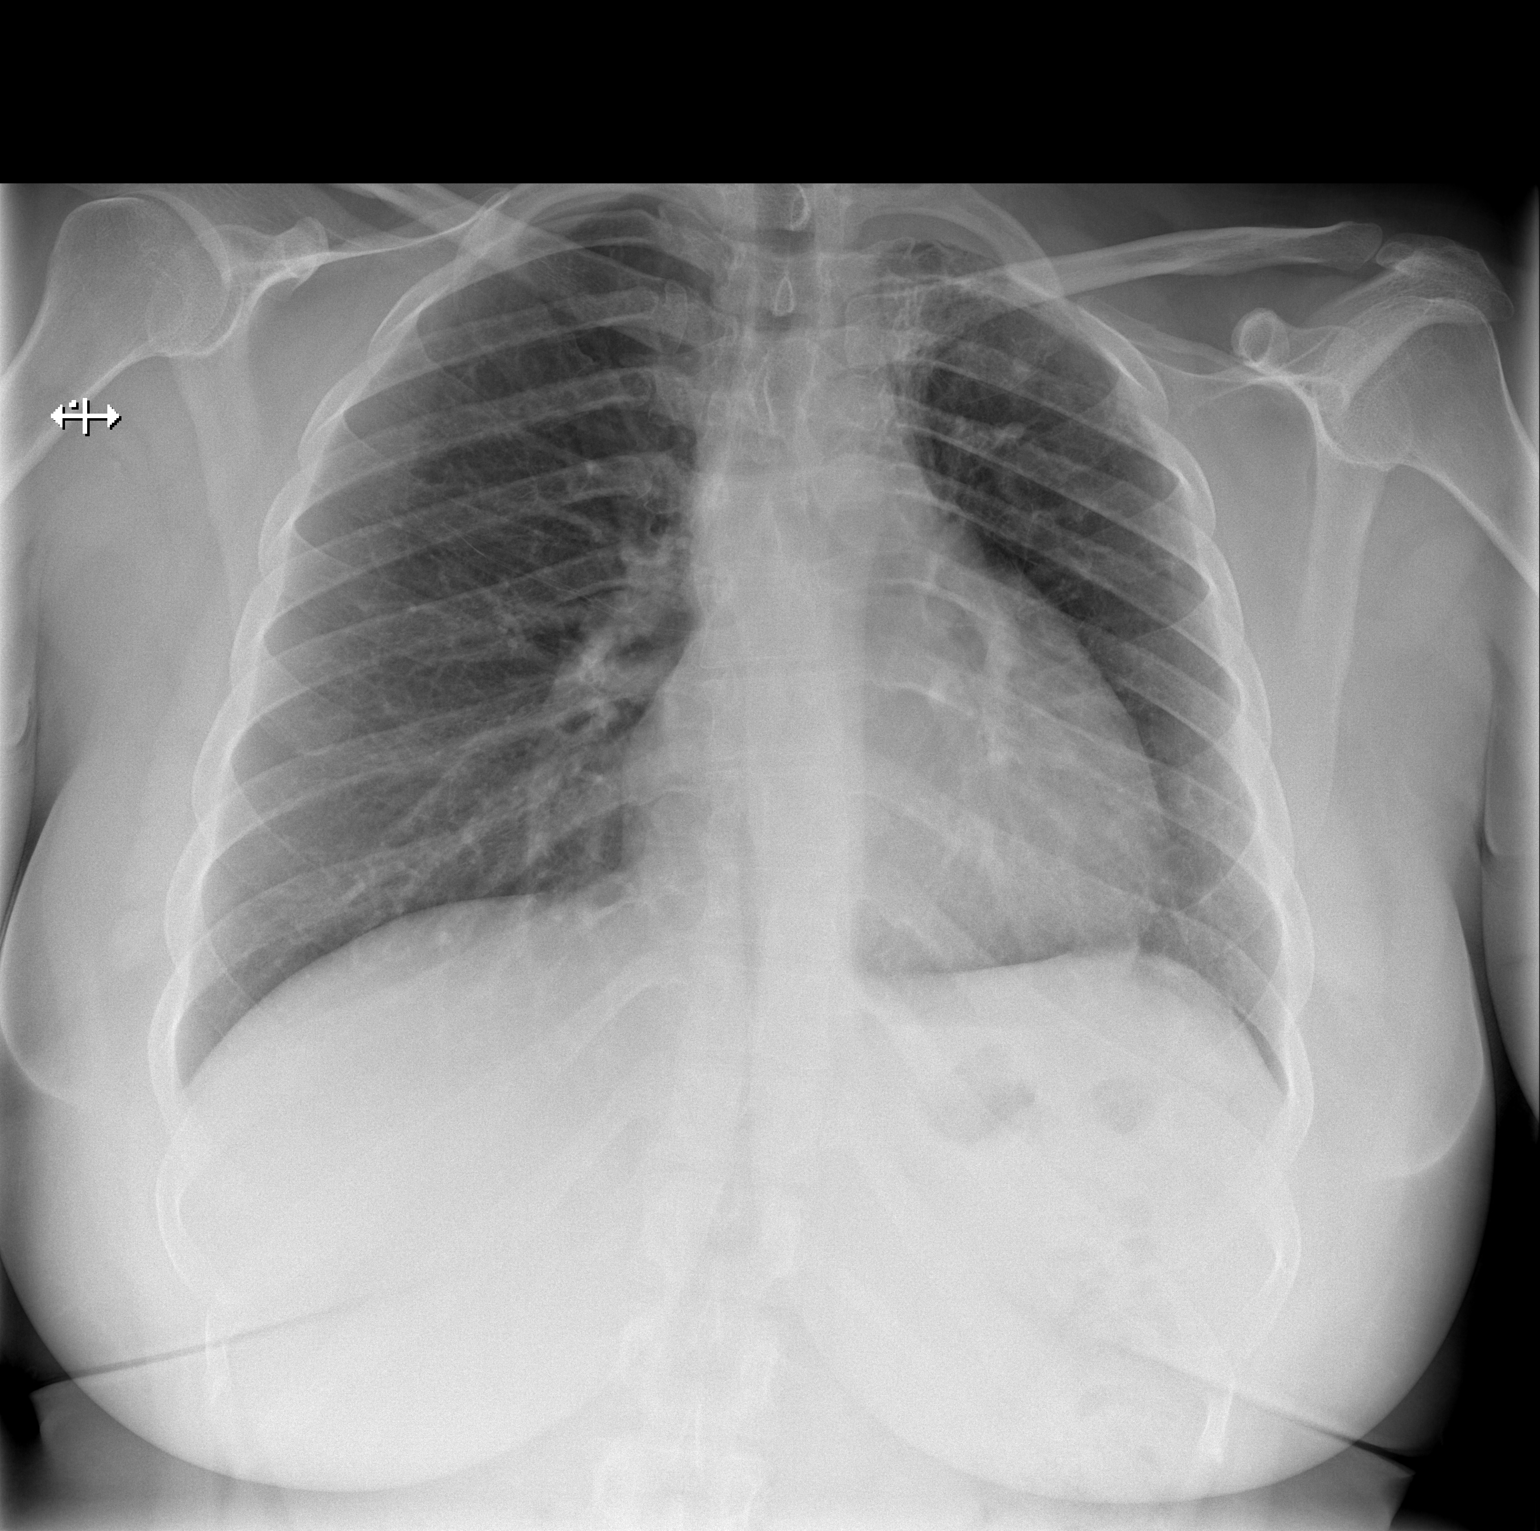

[w chest lat]
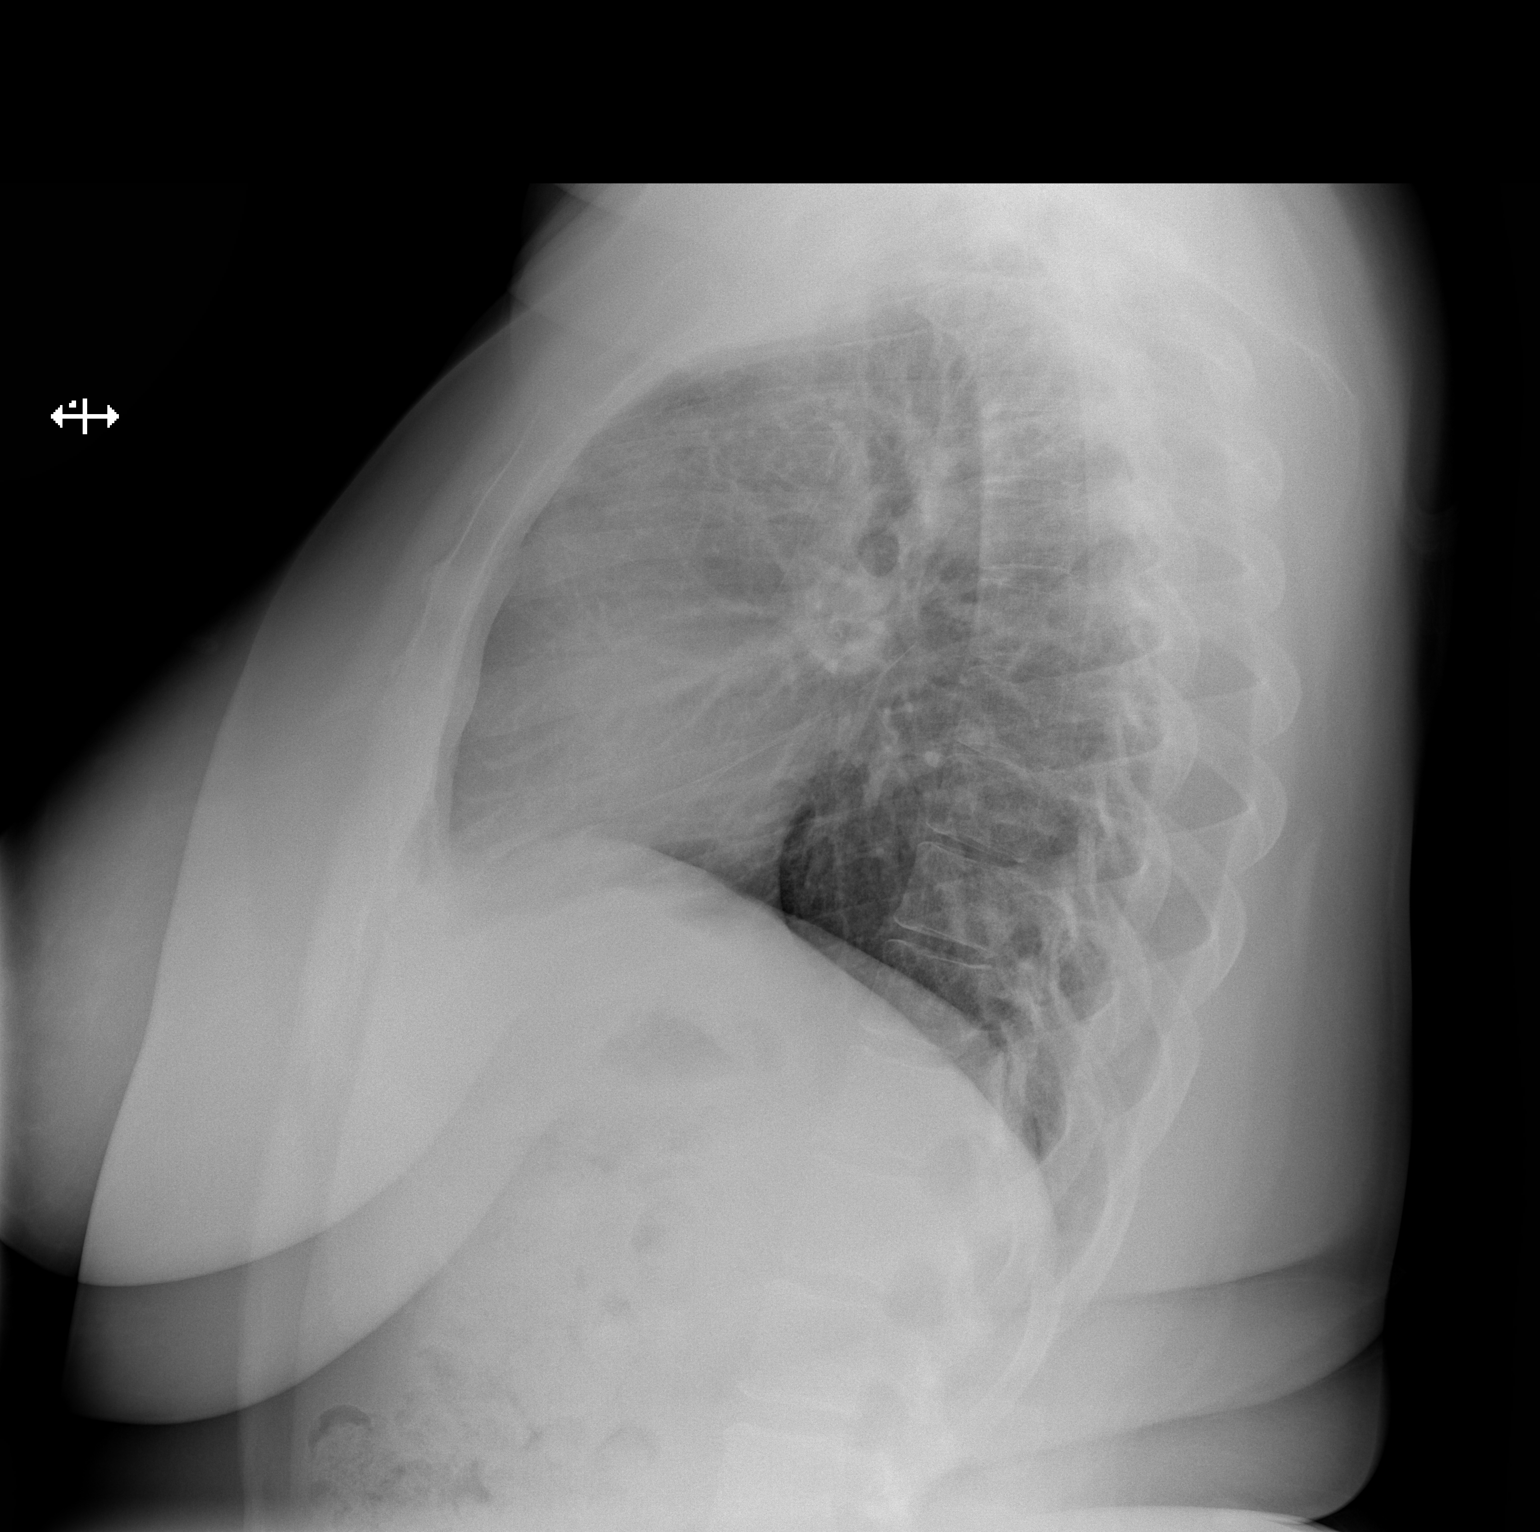

[2 of 2 positions shown; findings below may reference images not displayed]

FINDINGS: Cardiomediastinal silhouette is stable. No acute infiltrate or
pleural effusion. No pulmonary edema. Stable scarring in left upper
lobe.
IMPRESSION: No active cardiopulmonary disease.  No significant change.

## 2015-06-09 ENCOUNTER — Ambulatory Visit: Payer: Self-pay | Admitting: Family Medicine

## 2015-07-17 ENCOUNTER — Encounter: Payer: Self-pay | Admitting: Family Medicine

## 2015-07-17 ENCOUNTER — Ambulatory Visit (INDEPENDENT_AMBULATORY_CARE_PROVIDER_SITE_OTHER): Payer: Self-pay | Admitting: Family Medicine

## 2015-07-17 VITALS — BP 144/91 | HR 83 | Temp 98.3°F | Ht 67.5 in | Wt 235.9 lb

## 2015-07-17 DIAGNOSIS — N898 Other specified noninflammatory disorders of vagina: Secondary | ICD-10-CM

## 2015-07-17 DIAGNOSIS — IMO0002 Reserved for concepts with insufficient information to code with codable children: Secondary | ICD-10-CM | POA: Insufficient documentation

## 2015-07-17 DIAGNOSIS — E1165 Type 2 diabetes mellitus with hyperglycemia: Secondary | ICD-10-CM

## 2015-07-17 DIAGNOSIS — E119 Type 2 diabetes mellitus without complications: Secondary | ICD-10-CM

## 2015-07-17 DIAGNOSIS — C8190 Hodgkin lymphoma, unspecified, unspecified site: Secondary | ICD-10-CM

## 2015-07-17 DIAGNOSIS — E038 Other specified hypothyroidism: Secondary | ICD-10-CM

## 2015-07-17 DIAGNOSIS — I10 Essential (primary) hypertension: Secondary | ICD-10-CM

## 2015-07-17 HISTORY — DX: Reserved for concepts with insufficient information to code with codable children: IMO0002

## 2015-07-17 LAB — POCT GLYCOSYLATED HEMOGLOBIN (HGB A1C): Hemoglobin A1C: 13.8

## 2015-07-17 MED ORDER — ZOLPIDEM TARTRATE 5 MG PO TABS: 5.0000 mg | ORAL_TABLET | Freq: Every evening | ORAL | Status: DC | PRN

## 2015-07-17 MED ORDER — SITAGLIPTIN PHOSPHATE 100 MG PO TABS: 100.0000 mg | ORAL_TABLET | Freq: Every day | ORAL | Status: DC

## 2015-07-17 MED ORDER — FLUCONAZOLE 150 MG PO TABS: 150.0000 mg | ORAL_TABLET | Freq: Once | ORAL | Status: DC

## 2015-07-17 MED ORDER — LEVOTHYROXINE SODIUM 125 MCG PO TABS: 125.0000 ug | ORAL_TABLET | Freq: Every day | ORAL | Status: DC

## 2015-07-17 MED ORDER — GLIPIZIDE 5 MG PO TABS: 5.0000 mg | ORAL_TABLET | Freq: Two times a day (BID) | ORAL | Status: DC

## 2015-07-17 NOTE — Patient Instructions (Signed)
As we talked about, I'm concerned about your diabetes level.  Restart the Glipizide.    I've also sent in another medicine called Januvia.  This will need prior authorization, so it may be the middle of the week before you are able to pick this up.  Come back and see Dr. Valentina Lucks as we discussed.  Diflucan for yeast.    Ambien for sleep.   Increase in thyroid medicine.  We will recheck this when you come back to see Dr. Valentina Lucks.    Kermit Balo luck with your wedding!

## 2015-07-17 NOTE — Assessment & Plan Note (Signed)
She has not been taking this regularly.   Last refill was for much earlier this year, and she states she still has some pills left at home. Even when she was taking her Synthroid regularly, her TSH was not at goal.  Will have slight increase today.  Recheck in 6 weeks.

## 2015-07-17 NOTE — Assessment & Plan Note (Signed)
A1C again >13 today.  Showed her graph correlating A1C with CBGs.  Discussed that her average blood sugars have been much higher than the 150 she saw on her family members glucometer reading. Pilar Plate discussion with her about treatment options for diabetes. Discussed cinnamon will not be helpful with A1c this high.  I did discuss with her that we could actually start insulin today based on her symptoms and A1c. She was very hesitant about this.  She is not able tolerate metformin. Will restart her glipizide which she hasn't taken for some time. We'll also add Januvia.Follow with Dr. Valentina Lucks pharmacy clinic for further evaluation for her diabetes in about 2 weeks.

## 2015-07-17 NOTE — Assessment & Plan Note (Signed)
Still not at goal. With long discussion over diabetes today, did not press HTN.  Will need to continue to encourage adherence to meds.

## 2015-07-17 NOTE — Progress Notes (Signed)
Subjective:    [name] is a 34 y.o. female who presents to [facility] today for several issues:  1.  Diabetes:  Currently on Metformin and Glipizide -- BUT not taking these.  Metformin has started to cause abdominal pain.  Unclear when she last took this or Glipizide.  She has been trying to control her diabetes with cinnamon.  States CBGs (has family member with diabetes) at home are in 150s.     No hypoglycemic events.  No paresthesia or peripheral nerve pain.  Not doing much for exercise.  Does endorse polyuria and polydipsia symptoms. Lab Results  Component Value Date   HGBA1C 13.5 12/30/2014   2.  Hypertension:  Long-term problem for this patient.  No adverse effects from medication.  Not checking it regularly.  No HA, CP, dizziness, shortness of breath, palpitations, or LE swelling.  Unclear how often she is taking her medications. BP Readings from Last 3 Encounters:  07/17/15 144/91  02/05/15 138/88  01/27/15 148/107   3. Yeast infections:  Recurrent.  Trying lots of OTC herbal medications without much relief.  Thick white discharge.  Thinks it's secondary to diabetes and hypothyroidism.     The following portions of the patient's history were reviewed and updated as appropriate: allergies, current medications, past medical history, family and social history, and problem list. Patient is a nonsmoker.    PMH reviewed.  Past Medical History  Diagnosis Date  . Hypertension   . Complication of anesthesia SLOW TO WAKE UP AFTER C SECTION  . Benign essential HTN 01/19/2012  . Eczema 01/19/2012  . Muscle spasm of back 03/09/2012  . S/P chemotherapy, time since 4-12 weeks 05/11/2012    4 cycles of ABVD with neulasta support and zoladex  . Cough, persistent 07/27/2012  . Leukopenia 08/01/2012  . Asthma     seasonal, worse in spring. Uses inhaler during this time of year  . Migraine   . DM II (diabetes mellitus, type II), controlled 01/19/2012  . Hodgkin lymphoma 01/18/2012    left  axilla & left neck dx'd 01/16/2012  . Hypothyroidism     06/2014   Past Surgical History  Procedure Laterality Date  . Dilation and curettage of uterus    . Axillary lymph node biopsy  12/2011    left  . Portacath placement  01/25/2012    Procedure: INSERTION PORT-A-CATH;  Surgeon: Gayland Curry, MD,FACS;  Location: [facility];  Service: General;  Laterality: Right;  right subclavian  . Cesarean section  2008    Medications reviewed. Current Outpatient Prescriptions  Medication Sig Dispense Refill  . albuterol (PROVENTIL HFA;VENTOLIN HFA) 108 (90 BASE) MCG/ACT inhaler Inhale 2 puffs into the lungs every 6 (six) hours as needed for wheezing or shortness of breath. 1 Inhaler 1  . albuterol-ipratropium (COMBIVENT) 18-103 MCG/ACT inhaler Inhale 2 puffs into the lungs every 6 (six) hours as needed for wheezing or shortness of breath. 14.7 g 1  . amLODipine (NORVASC) 10 MG tablet Take 10 mg by mouth daily.    . Boric Acid GRAN Apply 1 application topically at bedtime.    Marland Kitchen doxycycline (VIBRAMYCIN) 100 MG capsule Take 1 capsule (100 mg total) by mouth 2 (two) times daily. 20 capsule 0  . fluconazole (DIFLUCAN) 150 MG tablet Take 1 tablet (150 mg total) by mouth once. 2 tablet 0  . glipiZIDE (GLUCOTROL) 5 MG tablet Take 1 tablet (5 mg total) by mouth 2 (two) times daily before a meal. 60 tablet 1  .  levothyroxine (SYNTHROID, LEVOTHROID) 100 MCG tablet Take 1 tablet (100 mcg total) by mouth daily. 30 tablet 3  . metFORMIN (GLUCOPHAGE) 850 MG tablet Take 1 tablet (850 mg total) by mouth 2 (two) times daily with a meal. 60 tablet 1  . metoprolol succinate (TOPROL-XL) 100 MG 24 hr tablet Take 100 mg by mouth daily. Take with or immediately following a meal.    . miconazole (MONISTAT 7) 2 % vaginal cream Place 1 Applicatorful vaginally at bedtime.    Marland Kitchen oxyCODONE-acetaminophen (PERCOCET/ROXICET) 5-325 MG per tablet Take 1-2 tablets by mouth every 4 (four) hours as needed for severe pain. 15 tablet 0  .  Probiotic Product (PROBIOTIC DAILY) CAPS Take 1 capsule by mouth daily.    . valsartan-hydrochlorothiazide (DIOVAN HCT) 160-25 MG per tablet Take 1 tablet by mouth daily. 30 tablet 11   No current facility-administered medications for this visit.     Objective:   Physical Exam BP 144/91 mmHg  Pulse 83  Temp(Src) 98.3 F (36.8 C) (Oral)  Ht 5' 7.5" (1.715 m)  Wt 235 lb 14.4 oz (107.004 kg)  BMI 36.38 kg/m2 Gen:  Alert, cooperative patient who appears stated age in no acute distress.  Vital signs reviewed. HEENT: EOMI,  MMM Neck:  No goiter.   Cardiac:  Regular rate and rhythm  Pulm:  Clear to auscultation bilaterally Abd:  Soft/nondistended/nontender.   Exts: Non edematous BL  LE, warm and well perfused.  GU:  deferred  No results found for this or any previous visit (from the past 72 hour(s)).

## 2015-07-17 NOTE — Assessment & Plan Note (Signed)
Yeast vaginitis based on symptoms and patient's recurrent history.   Treat with Diflucan.  Gave 5 pills for intermittent infection.  Discussed this won't clear up until DM2 improves.

## 2015-07-17 NOTE — Assessment & Plan Note (Signed)
Followed by oncology.  Has been spaced out to yearly appt's.

## 2015-07-27 ENCOUNTER — Ambulatory Visit (INDEPENDENT_AMBULATORY_CARE_PROVIDER_SITE_OTHER): Payer: Self-pay | Admitting: Pharmacist

## 2015-07-27 DIAGNOSIS — E1165 Type 2 diabetes mellitus with hyperglycemia: Secondary | ICD-10-CM

## 2015-07-27 DIAGNOSIS — IMO0002 Reserved for concepts with insufficient information to code with codable children: Secondary | ICD-10-CM

## 2015-07-27 MED ORDER — ACCU-CHEK SOFT TOUCH LANCETS MISC: Status: DC

## 2015-07-27 MED ORDER — GLUCOSE BLOOD VI STRP: ORAL_STRIP | Status: DC

## 2015-07-27 MED ORDER — INSULIN GLARGINE 100 UNIT/ML SOLOSTAR PEN: 10.0000 [IU] | PEN_INJECTOR | Freq: Every day | SUBCUTANEOUS | Status: DC

## 2015-07-27 NOTE — Patient Instructions (Signed)
Good to see you today!  Start using lantus 11 units each night night before bed. Check your blood sugar each morning before eating breakfast. Increase dose of lantus by one unit each night until morning blood glucose is less than 130. Do not use more than 25 units total.   If you feel symptoms of hypoglycemia (shakiness, sweating, confusion, anxiety) check your blood sugar immediately and quickly drink a small glass of juice or regular soda. Repeat again in 15 minutes if symptoms do not improve.  Try to reduce the amount of breads, etc. With meals by half.  Follow up by phone in one week

## 2015-07-28 ENCOUNTER — Encounter: Payer: Self-pay | Admitting: Pharmacist

## 2015-07-28 NOTE — Assessment & Plan Note (Signed)
Diabetes diagnosed in 2014 currently uncontrolled. Reports adherence with medication other than metformin. Denies hypoglycemic events however, she only rarely checks her BG with the use of a family member's meter (reports nearly all immediate family members are diagnosed with diabetes). Did not bring any meter readings to visit but states the two times she did check in the morning, readings were 114 and 122. Most recent A1c does not correlate to reported BG. She also reports frequent UTIs and waking up 3-4 times per night to void. Patient is motivated to get control of her diabetes but she is unlikely to do so with her current regimen. Discussed treatment options with patient and she agreed to start basal insulin. Gave patient a sample lantus pen (Lot#: [id number], Exp: 08/27/2017), pen needles, and glucometer. Counseled patient on use of insulin pen and meter, as well as signs and symptoms of hypoglycemia and the appropriate treatment of such. Able to verbalize hypoglycemia management plan. Also discussed reducing her intake of breads and carbs by half.  Started basal insulin (lantus). Patient will inject 11 units QHS and will continue to titrate 1 unit/day if fasting CBGs > 130mg /dl until fasting CBGs reach goal or she reaches 25 units/day.

## 2015-07-28 NOTE — Progress Notes (Signed)
Patient ID: [name], female   DOB: Apr 24, 1981, 34 y.o.   MRN: [id number] Reviewed: Agree with Dr. Graylin Shiver documentation and management.

## 2015-07-28 NOTE — Progress Notes (Signed)
S:    Patient arrives in a pleasant mood. Presents for diabetes management.  Patient reports having history of Diabetes since the year of 2014.   Patient reports adherence with medications. Current diabetes medications include glipizide 5mg  BID, metformin 850mg  BID, and sitagliptin 100mg  QD. Patient informed us she is not currently taking metformin as it gives her severe stomach cramps. Wishes to stop metformin.  Patient denies hypoglycemic events.  Patient reported dietary habits: Patient works at a [employer] as well as [employer]. Reports she generally eats at the health food store thereby avoiding junk food. However, she also states she eats a lot of breads/carbs and occasionally drinks fruit juices.   Patient reported exercise habits: Has recently started going to the gym 3x a week in order to lose some weight before her wedding. On days she does not go to the gym, she uses her treadmill at home.   Patient reports nocturia about 3-4 times/night. Patient denies neuropathy. Patient denies visual changes.    O:  . Lab Results  Component Value Date   HGBA1C 13.8 07/17/2015     A/P: Diabetes diagnosed in 2014 currently uncontrolled. Reports adherence with medication other than metformin. Denies hypoglycemic events however, she only rarely checks her BG with the use of a family member's meter (reports nearly all immediate family members are diagnosed with diabetes). Did not bring any meter readings to visit but states the two times she did check in the morning, readings were 114 and 122. Most recent A1c does not correlate to reported BG. She also reports frequent UTIs and waking up 3-4 times per night to void. Patient is motivated to get control of her diabetes but she is unlikely to do so with her current regimen. Discussed treatment options with patient and she agreed to start basal insulin. Gave patient a sample lantus pen (Lot#: [id number], Exp: 08/27/2017), pen needles, and  glucometer. Counseled patient on use of insulin pen and meter, as well as signs and symptoms of hypoglycemia and the appropriate treatment of such. Able to verbalize hypoglycemia management plan. Also discussed reducing her intake of breads and carbs by half.  Started basal insulin (lantus). Patient will inject 11 units QHS and will continue to titrate 1 unit/day if fasting CBGs > 130mg /dl until fasting CBGs reach goal or she reaches 25 units/day.    Written patient instructions provided.  Follow up in Pharmacist Clinic by phone in one week. Total time in face to face counseling 40 minutes.  Patient seen with Stephens November, PharmD Resident.

## 2015-08-06 ENCOUNTER — Telehealth: Payer: Self-pay | Admitting: Family Medicine

## 2015-08-06 IMAGING — MG MM SCREEN MAMMOGRAM BILATERAL
4 series · 4 of 4 positions shown · non-contrast
Comparison: Previous exam(s).

CLINICAL DATA: Screening.

EXAM:
DIGITAL SCREENING BILATERAL MAMMOGRAM WITH CAD

[R CC]
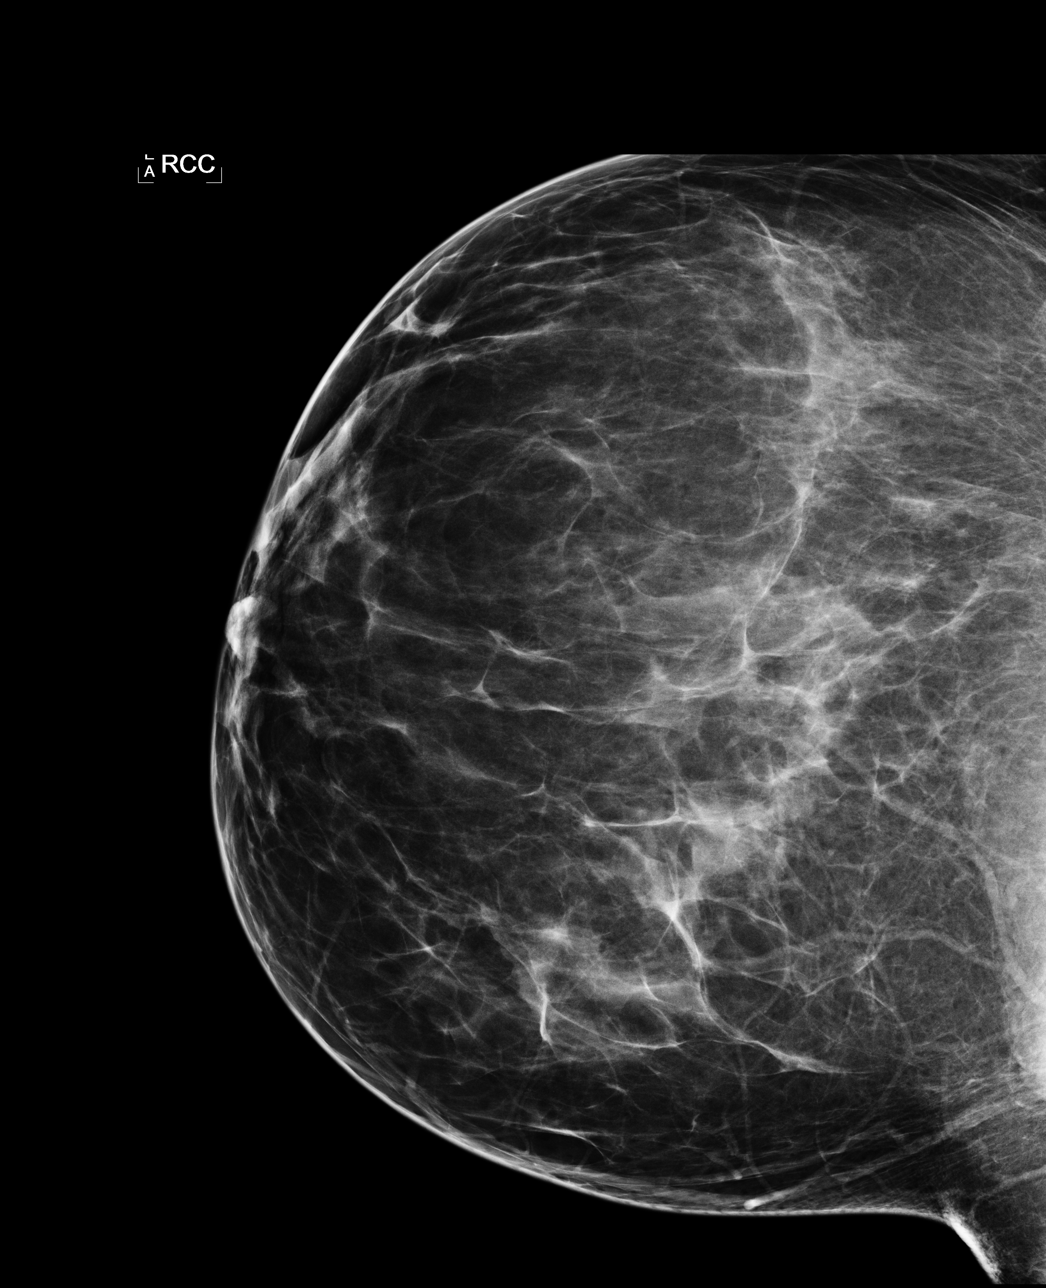

[L CC]
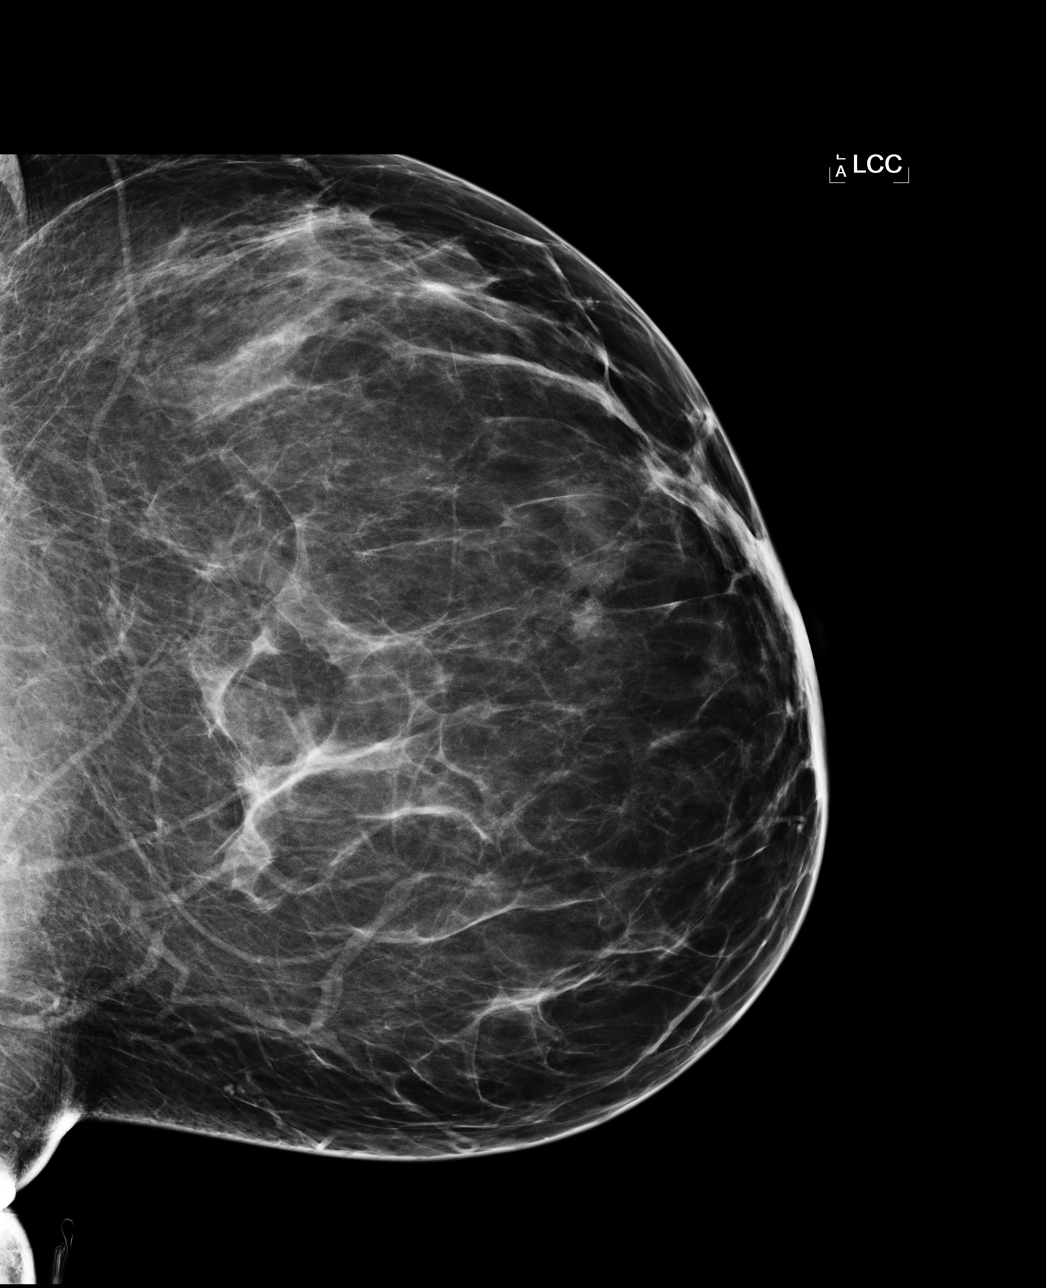

[L MLO]
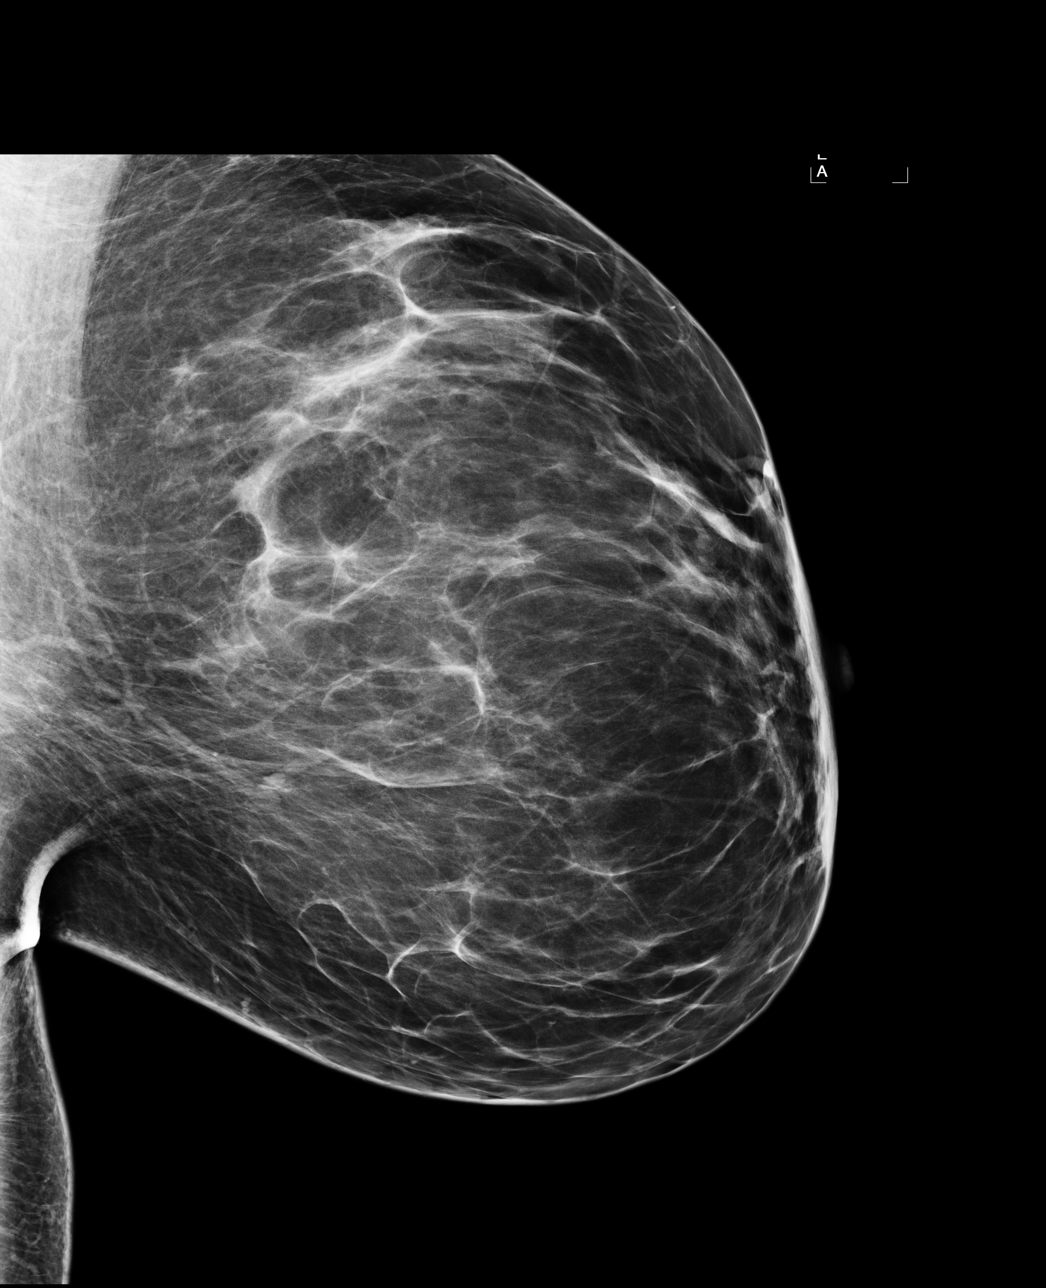

[R MLO]
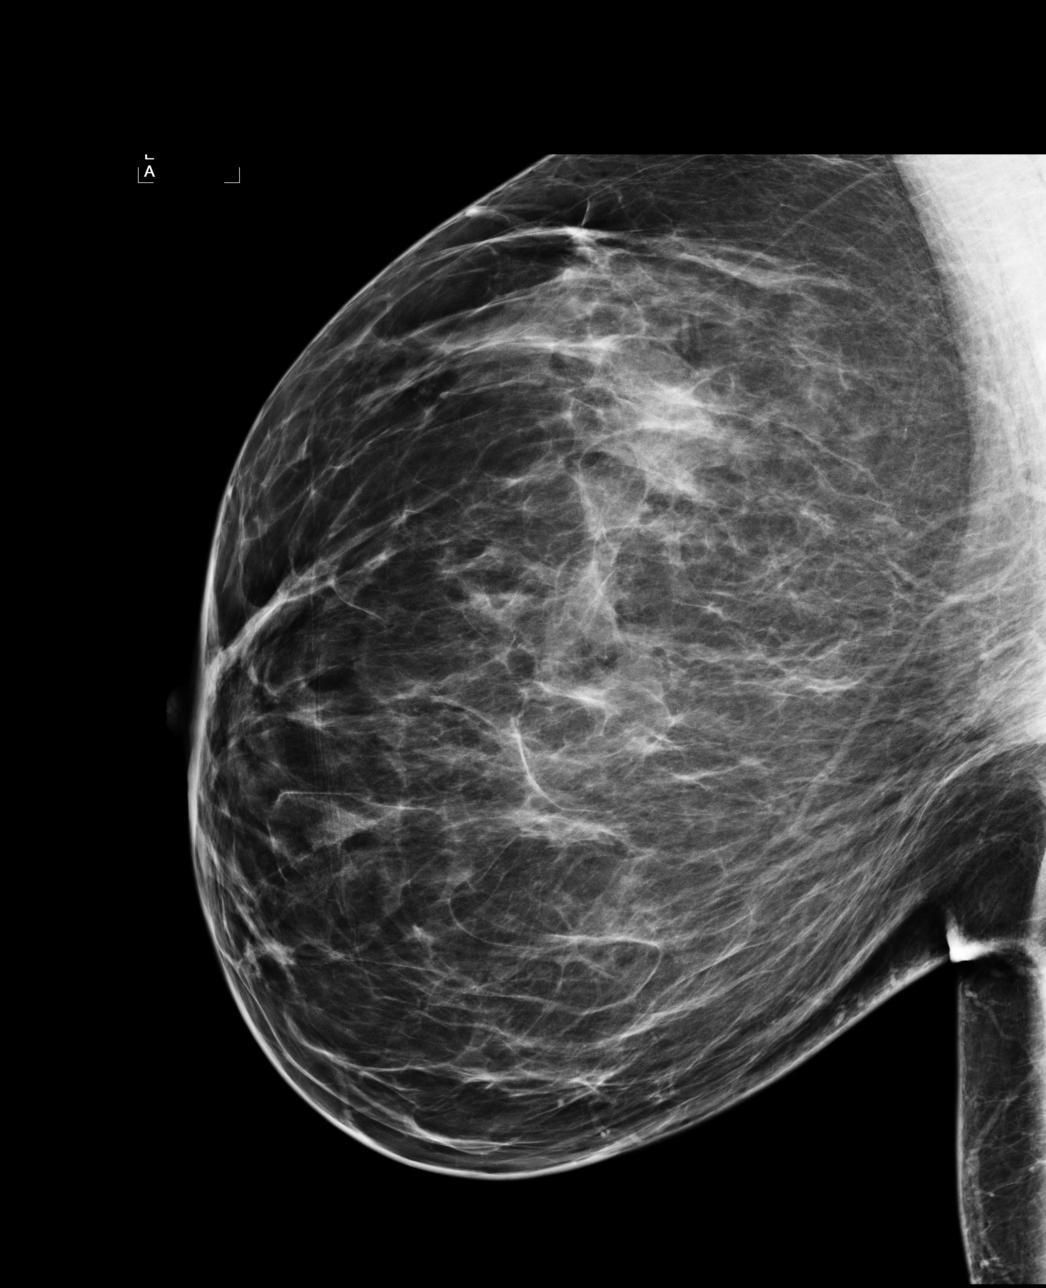

[4 of 4 positions shown; findings below may reference images not displayed]

ACR Breast Density Category b: There are scattered areas of
fibroglandular density.
FINDINGS: There are no findings suspicious for malignancy. Images were
processed with CAD.
IMPRESSION: No mammographic evidence of malignancy. A result letter of this
screening mammogram will be mailed directly to the patient.

RECOMMENDATION:
Screening mammogram at age 42. (Code:[id number]-[id number])

BI-RADS CATEGORY  1: Negative.

## 2015-08-06 NOTE — Telephone Encounter (Signed)
Called patient at request of her mother, who is concerned for her blurry vision and non-adherence to medication.  Had to leave message to call us back.  I'll talk with her when she returns the call.

## 2015-08-07 NOTE — Telephone Encounter (Signed)
Still unable to get through to [place].  To call clinic.

## 2015-08-11 NOTE — Telephone Encounter (Signed)
Phone call returned by patient following phone message left requesting call back on 9/12.   She states her blood sugar readings are no longer in the 300s.  She is seeing CBG readings of 140-260.  Denies CBGs < 100 and denies hypoglycemic symptoms.  States her nocturia is much improved.   Modest improvement with lethargy/tiredness.  States she is very busy with wedding plans.  Continues to have blurry vision.    We discussed vision changes are likely related to high blood sugars in the past and time will help resolve her vision changes.  She was comfortable with the idea of expecting some improvement over the next few weeks.   She will increase her lantus from her current 21 units daily by a few more units to achieve fasting readings 100-150 ~25units daily anticipated.  Her goal was discussed at this time to be improved sleeping (less nocturia), less polyuria, and overall increased energy.   She plans to follow up 1 week after her wedding.     I anticipate seeing her in pharmacy clinic in early October.

## 2015-08-21 ENCOUNTER — Telehealth: Payer: Self-pay | Admitting: Family Medicine

## 2015-08-21 NOTE — Telephone Encounter (Signed)
Pt called and would like a refill on her needles for her pens. jw

## 2015-08-24 MED ORDER — INSULIN PEN NEEDLE 31G X 8 MM MISC: Status: DC

## 2015-08-24 NOTE — Telephone Encounter (Signed)
Done

## 2015-08-31 ENCOUNTER — Telehealth: Payer: Self-pay | Admitting: Family Medicine

## 2015-08-31 NOTE — Telephone Encounter (Signed)
Pt called because her pharmacy said they didn't receive the refill request from [place] on 9/26. Can we call and let them know that we sent this in. jw

## 2015-09-01 MED ORDER — INSULIN PEN NEEDLE 31G X 8 MM MISC: Status: DC

## 2015-09-01 NOTE — Telephone Encounter (Signed)
I have sent this in again.  I'm assuming she's asking for the needles, which is what was sent on 9/26.

## 2015-09-10 ENCOUNTER — Ambulatory Visit (INDEPENDENT_AMBULATORY_CARE_PROVIDER_SITE_OTHER): Payer: Medicaid Other | Admitting: Family Medicine

## 2015-09-10 ENCOUNTER — Encounter: Payer: Self-pay | Admitting: Pharmacist

## 2015-09-10 ENCOUNTER — Ambulatory Visit (INDEPENDENT_AMBULATORY_CARE_PROVIDER_SITE_OTHER): Payer: Medicaid Other | Admitting: Pharmacist

## 2015-09-10 ENCOUNTER — Encounter: Payer: Self-pay | Admitting: Family Medicine

## 2015-09-10 VITALS — BP 155/92 | HR 69 | Ht 67.0 in | Wt 247.0 lb

## 2015-09-10 VITALS — BP 155/92 | HR 69 | Temp 98.3°F

## 2015-09-10 DIAGNOSIS — Z794 Long term (current) use of insulin: Secondary | ICD-10-CM

## 2015-09-10 DIAGNOSIS — R809 Proteinuria, unspecified: Secondary | ICD-10-CM

## 2015-09-10 DIAGNOSIS — IMO0001 Reserved for inherently not codable concepts without codable children: Secondary | ICD-10-CM

## 2015-09-10 DIAGNOSIS — I10 Essential (primary) hypertension: Secondary | ICD-10-CM | POA: Diagnosis present

## 2015-09-10 DIAGNOSIS — E1165 Type 2 diabetes mellitus with hyperglycemia: Secondary | ICD-10-CM | POA: Diagnosis not present

## 2015-09-10 DIAGNOSIS — R059 Cough, unspecified: Secondary | ICD-10-CM

## 2015-09-10 DIAGNOSIS — R05 Cough: Secondary | ICD-10-CM | POA: Diagnosis present

## 2015-09-10 MED ORDER — ACCU-CHEK SOFT TOUCH LANCETS MISC: Status: DC

## 2015-09-10 MED ORDER — INSULIN PEN NEEDLE 32G X 4 MM MISC: 1.0000 | Status: DC | PRN

## 2015-09-10 MED ORDER — AMLODIPINE BESYLATE 2.5 MG PO TABS: 2.5000 mg | ORAL_TABLET | Freq: Every day | ORAL | Status: DC

## 2015-09-10 MED ORDER — EXENATIDE 5 MCG/0.02ML ~~LOC~~ SOPN: 5.0000 ug | PEN_INJECTOR | Freq: Two times a day (BID) | SUBCUTANEOUS | Status: DC

## 2015-09-10 NOTE — Assessment & Plan Note (Signed)
h/o leukopenia, hx Hodgkin's Lymphoma s/p radiation, and hx of recurrent PNA presenting with minimally productive cough for 1 week. Lungs are clear to auscultation, good O2 saturations on room air, no signs of increased work of breathing, and afebrile at clinic. Likely due to viral bronchitis as lung exam was unremarkable, however due to history of leukopenia and hx recurrent PNA, will obtain CXR to rule out PNA before considering antibiotic therapy.  - chest x-ray 2 view - recommended over the counter cough medication if needed  - return precautions provided

## 2015-09-10 NOTE — Patient Instructions (Addendum)
Start new diabetes medication, Byetta. Pick up this prescription at the pharmacy. Take this medication twice a day, prior to meals. This will help reduce the possible side effect of nausea.  Stop Januvia. You will continue Lantus 22 units daily and glipizide. Remember to take glipizide with meals.  Increase your exercise, by going back to the gym as we discussed. Incorporate cardio on the treadmill prior to using weights.  Follow-up with Dr. Valentina Lucks in 1 month.

## 2015-09-10 NOTE — Patient Instructions (Signed)
Thank you for coming.   Your cough is most likely due to bronchitis which is usually caused by a virus. However, we ordered a chest x-ray to rule out a pneumonia.   If your symptoms worsen and/or if you develop fevers/chills or worsening shortness of breath, please seek medical help immediately.

## 2015-09-10 NOTE — Progress Notes (Signed)
Patient ID: [name], female   DOB: Aug 19, 1981, 34 y.o.   MRN: [id number] Reviewed: agree with Dr. Graylin Shiver documentation and management.

## 2015-09-10 NOTE — Progress Notes (Signed)
Patient ID: [name], female   DOB: 09-13-1981, 34 y.o.   MRN: [id number] Subjective:   CC: cough   HPI:  Patient initially here for pharmacy clinic with Dr. Valentina Lucks. Due to patient's cough Dr. Valentina Lucks recommended evaluation at [facility].   Patient notes of minimally productive cough for about 1 week; she coughs up "yellowish phlegm once in a while". This has been progressively getting worse in intensity. Also notes of mild SOB at rest for the past three days. Notes of fever 5 days ago of up to 102.37F; she took Tylenol cold and flu for this and has not had a fever since then. Denies fevers/chills today. Denies N/V, rhinorrhea, sore throat, wheezing. Notes of chest pain due to coughing. Has been taking Advil for mild HA. Notes of history of seasonal allergies for which she has an albuterol inhaler. Has not needed inhaler.   Of note patient note of history of recurrent pneumonia with most recent episode last winter.   Review of Systems - Per HPI.  PMH, FH, or SH; Of note patient has history of leukopenia and history of Hodgkin's Lymphoma s/p radiation tx three years ago.  Smoking status: former smoker    Objective:  Physical Exam BP 155/92 mmHg  Pulse 69  Temp(Src) 98.3 F (36.8 C)  SpO2 96% GEN: NAD, resting comfortably Neck: no cervical lymphadenopathy CV: RRR, no murmurs, rubs, or gallops PULM: CTAB, normal effort SKIN: No rash or cyanosis; warm and well-perfused PSYCH: Mood and affect euthymic, normal rate and volume of speech NEURO: Awake, alert, no focal deficits grossly, normal speech    Assessment:     [name] is a 34 y.o. female with h/o leukopenia, hx Hodgkin's Lymphoma s/p radiation, and hx of recurrent PNA presenting with minimally productive cough for 1 week. Lungs are clear to auscultation, good O2 saturations on room air, no signs of increased work of breathing, and afebrile at clinic.     Plan:     # See problem list and after visit summary for  problem-specific plans.  Follow-up: Follow up in if symptoms do not resolve or worsen  Smiley Houseman, MD [place]

## 2015-09-10 NOTE — Progress Notes (Signed)
S:    Patient arrives in a pleasant mood ambulating independently.  Presents for diabetes follow-up Patient reports Diabetes was diagnosed in 2014.   Patient reports adherence with medications. Current diabetes medications include Lantus, Glipizide, Januvia. She ran out of pen needles a few weeks ago but has been compliant since picking them up.  Patient denies hypoglycemic events.  Patient reports hypertension diagnosed in 2013  Patient reported dietary habits: Works at [employer], she does not have a consistent eating schedule. She tends to pick at different foods throughout the day.  Patient reported exercise habits: Patient reports that she has not been working out for the last 2 weeks. Prior to her wedding she was going to the gym 3x/week and walking on the treadmill at home when she had extra time.   Patient denies nocturia.  Patient denies neuropathy. Patient reports visual changes, has since starting insulin. Patient denies self foot exams.   O:  Lab Results  Component Value Date   HGBA1C 13.8 07/17/2015   2 hour post-prandial/random CBG: 172-376  BP 159/103; repeat BP 155/92 HR 72; repeat HR 69   A/P: Diabetes diagnosed in 2014 currently uncontrolled.   Patient denies hypoglycemic events and is able to verbalize appropriate hypoglycemia management plan.  Patient reports adherence with medication, though missed several doses of insulin due to running out of pen needles on a weekend. Control is suboptimal due to irregular diet schedule and lack of exercise.  Patient monitors blood glucose at home every evening, but does not typically monitor in the mornings.  She was counseled on the importance of fasting glucose readings and setting a schedule to monitor more effectively.  Continued basal insulin Lantus (insulin glargine) at 22 units every evening and Glipizide 5 mg twice daily with meals.  Counseled patient on the importance of taking glipizide with food for optimal  effect.  Discontinued Januvia and started Byetta 5 mg twice daily before meals injected subcutaneously.  Patient educated on the purpose, proper use, and potential adverse events.  Patient demonstrated ability to use the Byetta pen.    Hypertension diagnosed in 2013 is currently uncontrolled. Patient denies headaches.  Patient reports adherence to medications, though amlodipine recently discontinued.  She will continue Valsartan-Hydrochlorothiazide 160-25 mg daily and metoprolol succinate 100 mg daily.  Since she was recently married, the possibility of pregnancy was discussed due to the use of valsartan and the potential need for statin therapy.  She currently has no plans for pregnancy in the near future.  As blood pressure elevated today, Amlodipine restarted at 2.5 mg daily.  Patient also complained of cough, fever and shortness of breath, similar to cases of pneumonia in the past.  Deferred evaluation to Drs. Forbes Cellar for evaluation.      Written patient instructions provided.  Total time in face to face counseling 30 minutes.   Follow up in Pharmacist Clinic Visit in 1 month.   Patient seen with Viann Fish, PharmD Candidate, Dimitri Ped, PharmD Resident.and Elisabeth Most, PharmD, resident.

## 2015-09-10 NOTE — Assessment & Plan Note (Signed)
Diabetes diagnosed in 2014 currently uncontrolled.   Patient denies hypoglycemic events and is able to verbalize appropriate hypoglycemia management plan.  Patient reports adherence with medication, though missed several doses of insulin due to running out of pen needles on a weekend. Control is suboptimal due to irregular diet schedule and lack of exercise.  Patient monitors blood glucose at home every evening, but does not typically monitor in the mornings.  She was counseled on the importance of fasting glucose readings and setting a schedule to monitor more effectively.  Continued basal insulin Lantus (insulin glargine) at 22 units every evening and Glipizide 5 mg twice daily with meals.  Counseled patient on the importance of taking glipizide with food for optimal effect.  Discontinued Januvia and started Byetta 5 mg twice daily before meals injected subcutaneously.  Patient educated on the purpose, proper use, and potential adverse events.  Patient demonstrated ability to use the Byetta pen.

## 2015-09-10 NOTE — Assessment & Plan Note (Signed)
Hypertension diagnosed in 2013 is currently uncontrolled. Patient denies headaches.  Patient reports adherence to medications, though amlodipine recently discontinued.  She will continue Valsartan-Hydrochlorothiazide 160-25 mg daily and metoprolol succinate 100 mg daily.  Since she was recently married, the possibility of pregnancy was discussed due to the use of valsartan and the potential need for statin therapy.  She currently has no plans for pregnancy in the near future.  As blood pressure elevated today, Amlodipine restarted at 2.5 mg daily.

## 2015-09-11 ENCOUNTER — Ambulatory Visit (HOSPITAL_COMMUNITY)
Admission: RE | Admit: 2015-09-11 | Discharge: 2015-09-11 | Disposition: A | Discharge status: Home / Self Care | Payer: Medicaid Other | Source: Ambulatory Visit | Attending: Family Medicine | Admitting: Family Medicine

## 2015-09-11 ENCOUNTER — Other Ambulatory Visit: Payer: Self-pay | Admitting: Family Medicine

## 2015-09-11 DIAGNOSIS — R05 Cough: Secondary | ICD-10-CM | POA: Insufficient documentation

## 2015-09-11 DIAGNOSIS — E1165 Type 2 diabetes mellitus with hyperglycemia: Principal | ICD-10-CM

## 2015-09-11 DIAGNOSIS — Z794 Long term (current) use of insulin: Principal | ICD-10-CM

## 2015-09-11 DIAGNOSIS — IMO0001 Reserved for inherently not codable concepts without codable children: Secondary | ICD-10-CM

## 2015-09-11 DIAGNOSIS — R059 Cough, unspecified: Secondary | ICD-10-CM

## 2015-09-11 MED ORDER — INSULIN GLARGINE 100 UNIT/ML SOLOSTAR PEN: 22.0000 [IU] | PEN_INJECTOR | Freq: Every day | SUBCUTANEOUS | Status: DC

## 2015-09-11 NOTE — Telephone Encounter (Signed)
Refill sent to pharmacy.   

## 2015-09-11 NOTE — Telephone Encounter (Signed)
Need refill on the Lantus Solostar pen

## 2015-09-11 NOTE — Addendum Note (Signed)
Addended by: Leavy Cella on: 09/11/2015 12:35 PM   Modules accepted: Orders

## 2015-09-14 NOTE — Progress Notes (Deleted)
Patient ID: [name], female   DOB: Sep 26, 1981, 34 y.o.   MRN: [id number]

## 2016-01-12 ENCOUNTER — Ambulatory Visit (INDEPENDENT_AMBULATORY_CARE_PROVIDER_SITE_OTHER): Payer: Self-pay | Admitting: Family Medicine

## 2016-01-12 ENCOUNTER — Ambulatory Visit: Payer: Self-pay | Admitting: Family Medicine

## 2016-01-12 ENCOUNTER — Encounter: Payer: Self-pay | Admitting: Family Medicine

## 2016-01-12 VITALS — BP 130/90 | HR 78 | Temp 97.8°F | Ht 67.0 in | Wt 241.0 lb

## 2016-01-12 DIAGNOSIS — IMO0001 Reserved for inherently not codable concepts without codable children: Secondary | ICD-10-CM

## 2016-01-12 DIAGNOSIS — Z794 Long term (current) use of insulin: Secondary | ICD-10-CM

## 2016-01-12 DIAGNOSIS — E1165 Type 2 diabetes mellitus with hyperglycemia: Secondary | ICD-10-CM

## 2016-01-12 DIAGNOSIS — I10 Essential (primary) hypertension: Secondary | ICD-10-CM

## 2016-01-12 LAB — POCT GLYCOSYLATED HEMOGLOBIN (HGB A1C): Hemoglobin A1C: 14.8

## 2016-01-12 MED ORDER — GLIPIZIDE 5 MG PO TABS: 5.0000 mg | ORAL_TABLET | Freq: Two times a day (BID) | ORAL | Status: DC

## 2016-01-12 MED ORDER — INSULIN GLARGINE 100 UNIT/ML SOLOSTAR PEN: 22.0000 [IU] | PEN_INJECTOR | Freq: Every day | SUBCUTANEOUS | Status: DC

## 2016-01-12 MED ORDER — INSULIN PEN NEEDLE 32G X 4 MM MISC: 1.0000 | Status: DC | PRN

## 2016-01-12 NOTE — Progress Notes (Signed)
Subjective:    [name] is a 35 y.o. female who presents to [facility] today for several issues:  1. Diabetes:  Was previously on Glipizide, Byetta, and Lantus.  Has not taken any medications except for her BP meds since the end of December if not before.  She brings her meter today but has not measured a blood sugars since October. Her last recorded blood sugars were in the mid 300s. She has been using over-the-counter "diabetic teas ". She is also under fairly noticeable increased stress at home. Her mother has been diagnosed and is undergoing treatment for endometrial cancer. It sounds like she's not had chemotherapy well and is having doctor's appointments for 2 times a week. Also says that she's not getting out of bed and this is causing undue stress on [place].  She reports continued yeast infections as previously. Still with polydipsia during the day. Nocturia has improved.  Lab Results  Component Value Date   HGBA1C 14.8 01/12/2016   2. Hypertension:  Long-term problem for this patient.  No adverse effects from medication.  Not checking it regularly.  No HA, CP, dizziness, shortness of breath, palpitations, or LE swelling.  She is taking her blood pressure medications as well as over-the-counter blood pressure tease to help control her hypertension. BP Readings from Last 3 Encounters:  01/12/16 130/90  09/10/15 155/92  09/10/15 155/92   ROS as above per HPI, otherwise neg.   The following portions of the patient's history were reviewed and updated as appropriate: allergies, current medications, past medical history, family and social history, and problem list. Patient is a nonsmoker.    PMH reviewed.  Past Medical History  Diagnosis Date  . Hypertension   . Complication of anesthesia SLOW TO WAKE UP AFTER C SECTION  . Benign essential HTN 01/19/2012  . Eczema 01/19/2012  . Muscle spasm of back 03/09/2012  . S/P chemotherapy, time since 4-12 weeks 05/11/2012    4 cycles of ABVD  with neulasta support and zoladex  . Cough, persistent 07/27/2012  . Leukopenia 08/01/2012  . Asthma     seasonal, worse in spring. Uses inhaler during this time of year  . Migraine   . DM II (diabetes mellitus, type II), controlled ([place]) 01/19/2012  . Hodgkin lymphoma ([place]) 01/18/2012    left axilla & left neck dx'd 01/16/2012  . Hypothyroidism     06/2014   Past Surgical History  Procedure Laterality Date  . Dilation and curettage of uterus    . Axillary lymph node biopsy  12/2011    left  . Portacath placement  01/25/2012    Procedure: INSERTION PORT-A-CATH;  Surgeon: Gayland Curry, MD,FACS;  Location: [facility] ORS;  Service: General;  Laterality: Right;  right subclavian  . Cesarean section  2008    Medications reviewed. Current Outpatient Prescriptions  Medication Sig Dispense Refill  . amLODipine (NORVASC) 2.5 MG tablet Take 1 tablet (2.5 mg total) by mouth daily. (Patient not taking: Reported on 09/10/2015) 30 tablet 11  . exenatide (BYETTA 5 MCG PEN) 5 MCG/0.02ML SOPN injection Inject 0.02 mLs (5 mcg total) into the skin 2 (two) times daily with a meal. (Patient not taking: Reported on 09/10/2015) 1.2 mL 0  . glipiZIDE (GLUCOTROL) 5 MG tablet Take 1 tablet (5 mg total) by mouth 2 (two) times daily before a meal. 60 tablet 1  . glucose blood (ACCU-CHEK AVIVA) test strip Use as instructed 100 each prn  . Insulin Glargine (LANTUS SOLOSTAR) 100 UNIT/ML Solostar  Pen Inject 22 Units into the skin daily at 10 pm. 3 pen 1  . Insulin Pen Needle 32G X 4 MM MISC 1 Container by Does not apply route as needed. 1 each prn  . Lancets (ACCU-CHEK SOFT TOUCH) lancets Use as instructed 100 each prn  . levothyroxine (SYNTHROID, LEVOTHROID) 125 MCG tablet Take 1 tablet (125 mcg total) by mouth daily. 90 tablet 3  . metoprolol succinate (TOPROL-XL) 100 MG 24 hr tablet Take 100 mg by mouth daily. Take with or immediately following a meal.    . Probiotic Product (PROBIOTIC DAILY) CAPS Take 1 capsule by mouth  daily.    . valsartan-hydrochlorothiazide (DIOVAN HCT) 160-25 MG per tablet Take 1 tablet by mouth daily. 30 tablet 11  . zolpidem (AMBIEN) 5 MG tablet Take 1 tablet (5 mg total) by mouth at bedtime as needed for sleep. 15 tablet 1   No current facility-administered medications for this visit.     Objective:   Physical Exam BP 130/90 mmHg  Pulse 78  Temp(Src) 97.8 F (36.6 C) (Oral)  Ht 5\' 7"  (1.702 m)  Wt 241 lb (109.317 kg)  BMI 37.74 kg/m2  SpO2 98% Gen:  Alert, cooperative patient who appears stated age in no acute distress.  Vital signs reviewed. HEENT: EOMI,  MMM Cardiac:  Regular rate and rhythm without murmur auscultated.   Pulm:  Clear to auscultation bilaterally  Exts: Non edematous BL  LE, warm and well perfused.   Results for orders placed or performed in visit on 01/12/16 (from the past 72 hour(s))  POCT glycosylated hemoglobin (Hb A1C)     Status: Abnormal   Collection Time: 01/12/16  8:30 AM  Result Value Ref Range   Hemoglobin A1C 14.8

## 2016-01-12 NOTE — Patient Instructions (Signed)
I have sent in the Glipizide, needles, and Lantus for you.  Let me know about sending in for a home aide or home nurse/PT for your mom.    Remember to take care of yourself as well as everyone else!

## 2016-01-13 NOTE — Assessment & Plan Note (Signed)
Diastolic elevated today. However systolic much better than has been previously. Right now she'd rather concentrate on her diabetes does not want to increase or add any further blood pressure medicines. We'll recheck in next visit.

## 2016-01-13 NOTE — Assessment & Plan Note (Signed)
Uncontrolled. A1c is worse than last visit. This makes sense that she has stopped taking all of her diabetes medication. We had long discussion today about need to control her diabetes. Time seems to be the major constraint. She is taking care of her daughter as well as her mother who is very sick. Did discuss that she has blood sugars ranging in the mid 400s and this could 1 up with her going to hospital at which point she would not be able to care for any of her family members.  Discussed need for fasting CBG readings/monitoring. She seemed to connect with this. Refill for glipizide today as well as her Lantus. Follow-up in about one month to see how her blood sugars are doing. She was much less interested in restarting her Byetta.

## 2016-02-05 ENCOUNTER — Encounter: Payer: Self-pay | Admitting: *Deleted

## 2016-02-05 ENCOUNTER — Other Ambulatory Visit: Payer: Self-pay

## 2016-02-05 ENCOUNTER — Encounter: Payer: Self-pay | Admitting: Hematology and Oncology

## 2016-02-05 ENCOUNTER — Ambulatory Visit: Payer: Self-pay | Admitting: Hematology and Oncology

## 2016-02-05 NOTE — Progress Notes (Signed)
No Show letter from Dr. Gorsuch placed in outgoing mail.  

## 2016-04-07 ENCOUNTER — Other Ambulatory Visit: Payer: Self-pay | Admitting: *Deleted

## 2016-04-08 ENCOUNTER — Encounter: Payer: Self-pay | Admitting: Hematology and Oncology

## 2016-04-08 ENCOUNTER — Telehealth: Payer: Self-pay | Admitting: Hematology and Oncology

## 2016-04-08 ENCOUNTER — Ambulatory Visit (HOSPITAL_COMMUNITY)
Admission: RE | Admit: 2016-04-08 | Discharge: 2016-04-08 | Disposition: A | Discharge status: Home / Self Care | Payer: Medicaid Other | Source: Ambulatory Visit | Attending: Hematology and Oncology | Admitting: Hematology and Oncology

## 2016-04-08 ENCOUNTER — Other Ambulatory Visit: Payer: Self-pay | Admitting: Hematology and Oncology

## 2016-04-08 ENCOUNTER — Ambulatory Visit (HOSPITAL_BASED_OUTPATIENT_CLINIC_OR_DEPARTMENT_OTHER): Payer: 59 | Admitting: Hematology and Oncology

## 2016-04-08 ENCOUNTER — Other Ambulatory Visit (HOSPITAL_BASED_OUTPATIENT_CLINIC_OR_DEPARTMENT_OTHER): Payer: 59

## 2016-04-08 ENCOUNTER — Telehealth: Payer: Self-pay | Admitting: *Deleted

## 2016-04-08 VITALS — BP 148/96 | HR 85 | Temp 98.3°F | Resp 18 | Ht 67.0 in | Wt 234.7 lb

## 2016-04-08 DIAGNOSIS — R05 Cough: Secondary | ICD-10-CM | POA: Diagnosis not present

## 2016-04-08 DIAGNOSIS — Z8571 Personal history of Hodgkin lymphoma: Secondary | ICD-10-CM | POA: Insufficient documentation

## 2016-04-08 DIAGNOSIS — R059 Cough, unspecified: Secondary | ICD-10-CM

## 2016-04-08 DIAGNOSIS — Z515 Encounter for palliative care: Secondary | ICD-10-CM

## 2016-04-08 DIAGNOSIS — E1165 Type 2 diabetes mellitus with hyperglycemia: Secondary | ICD-10-CM | POA: Diagnosis not present

## 2016-04-08 DIAGNOSIS — Z794 Long term (current) use of insulin: Secondary | ICD-10-CM

## 2016-04-08 DIAGNOSIS — IMO0001 Reserved for inherently not codable concepts without codable children: Secondary | ICD-10-CM

## 2016-04-08 LAB — CBC WITH DIFFERENTIAL/PLATELET
BASO%: 0.3 % (ref 0.0–2.0)
Basophils Absolute: 0 10*3/uL (ref 0.0–0.1)
EOS%: 2.3 % (ref 0.0–7.0)
Eosinophils Absolute: 0.1 10*3/uL (ref 0.0–0.5)
HEMATOCRIT: 40.3 % (ref 34.8–46.6)
HEMOGLOBIN: 14.2 g/dL (ref 11.6–15.9)
LYMPH#: 1.3 10*3/uL (ref 0.9–3.3)
LYMPH%: 33.8 % (ref 14.0–49.7)
MCH: 32.1 pg (ref 25.1–34.0)
MCHC: 35.2 g/dL (ref 31.5–36.0)
MCV: 91 fL (ref 79.5–101.0)
MONO#: 0.4 10*3/uL (ref 0.1–0.9)
MONO%: 10.6 % (ref 0.0–14.0)
NEUT#: 2.1 10*3/uL (ref 1.5–6.5)
NEUT%: 53 % (ref 38.4–76.8)
PLATELETS: 214 10*3/uL (ref 145–400)
RBC: 4.43 10*6/uL (ref 3.70–5.45)
RDW: 12.1 % (ref 11.2–14.5)
WBC: 4 10*3/uL (ref 3.9–10.3)

## 2016-04-08 LAB — COMPREHENSIVE METABOLIC PANEL
ALK PHOS: 61 U/L (ref 40–150)
ALT: 17 U/L (ref 0–55)
AST: 11 U/L (ref 5–34)
Albumin: 3.8 g/dL (ref 3.5–5.0)
Anion Gap: 9 mEq/L (ref 3–11)
BILIRUBIN TOTAL: 0.54 mg/dL (ref 0.20–1.20)
BUN: 10.8 mg/dL (ref 7.0–26.0)
CALCIUM: 9.8 mg/dL (ref 8.4–10.4)
CHLORIDE: 101 meq/L (ref 98–109)
CO2: 27 mEq/L (ref 22–29)
CREATININE: 1.2 mg/dL — AB (ref 0.6–1.1)
EGFR: 70 mL/min/{1.73_m2} — ABNORMAL LOW (ref >90)
Glucose: 511 mg/dl — ABNORMAL HIGH (ref 70–140)
Potassium: 4.7 mEq/L (ref 3.5–5.1)
Sodium: 136 mEq/L (ref 136–145)
TOTAL PROTEIN: 7.7 g/dL (ref 6.4–8.3)

## 2016-04-08 NOTE — Assessment & Plan Note (Signed)
She is on close monitoring with PCP and is on medication. She has not been taking her medications as prescribed recently due to loss of insurance and stress taking care of her mother with recent diagnosis of cancer. Currently, she has obtained insurance coverage again. I gave her resources from [place] to help her pay for medications and recommend increased compliance again to get her diabetes under control.

## 2016-04-08 NOTE — Progress Notes (Signed)
[facility] OFFICE PROGRESS NOTE  Patient Care Team: Alveda Reasons, MD as PCP - General (Family Medicine)  SUMMARY OF ONCOLOGIC HISTORY: Oncology History   Hodgkin's lymphoma   Primary site: Lymphoid Neoplasms (Left)   Staging method: AJCC 6th Edition   Clinical: Stage II   Summary: Stage II       History of hodgkin's lymphoma   01/11/2012 Procedure [id number] Left axillary LN biopsy showed Hodgkin lymphoma   01/24/2012 Imaging Ct scan of chest, abdomen and pelvis & PET scan showed bulky left axillary LN, supraclavicular lymphadenoapthy and medistinal involvement   02/03/2012 - 05/11/2012 Chemotherapy She completed 4 cycles of ABVD   05/28/2012 Imaging PET scan showed partial response   06/18/2012 - 07/16/2012 Radiation Therapy She completed radiation treatment   07/27/2012 Imaging CT scan of chest showed pneumonia   03/28/2013 Imaging Ct chest showed no recurrence   04/15/2013 Imaging Ct angiogram showed pneumonia   06/04/2013 Imaging PET scan showed complete response   08/06/2013 Imaging Ct scan showed no recurrence   02/04/2014 Imaging Ct chest showed no recurrence   07/31/2014 Imaging Ct chest showed no recurrence    INTERVAL HISTORY: Please see below for problem oriented charting. She returns for further follow-up. She has not been taking good care of herself because her mother was recently diagnosed with ovarian cancer She has lost insurance temporarily and was not taking her insulin as prescribed She is aware of poorly controlled diabetes recently Denies visual changes of peripheral neuropathy from diabetes. No new lymphadenopathy. She complained of chronic dry cough  REVIEW OF SYSTEMS:   Constitutional: Denies fevers, chills or abnormal weight loss Eyes: Denies blurriness of vision Ears, nose, mouth, throat, and face: Denies mucositis or sore throat Cardiovascular: Denies palpitation, chest discomfort or lower extremity swelling Gastrointestinal:  Denies nausea,  heartburn or change in bowel habits Skin: Denies abnormal skin rashes Lymphatics: Denies new lymphadenopathy or easy bruising Neurological:Denies numbness, tingling or new weaknesses Behavioral/Psych: Mood is stable, no new changes  All other systems were reviewed with the patient and are negative.  I have reviewed the past medical history, past surgical history, social history and family history with the patient and they are unchanged from previous note.  ALLERGIES:  is allergic to imitrex; metformin and related; and penicillins.  MEDICATIONS:  Current Outpatient Prescriptions  Medication Sig Dispense Refill  . amLODipine (NORVASC) 2.5 MG tablet Take 1 tablet (2.5 mg total) by mouth daily. 30 tablet 11  . exenatide (BYETTA 5 MCG PEN) 5 MCG/0.02ML SOPN injection Inject 0.02 mLs (5 mcg total) into the skin 2 (two) times daily with a meal. 1.2 mL 0  . glipiZIDE (GLUCOTROL) 5 MG tablet Take 1 tablet (5 mg total) by mouth 2 (two) times daily before a meal. 60 tablet 1  . glucose blood (ACCU-CHEK AVIVA) test strip Use as instructed 100 each prn  . Insulin Glargine (LANTUS SOLOSTAR) 100 UNIT/ML Solostar Pen Inject 22 Units into the skin daily at 10 pm. 3 pen 1  . Insulin Pen Needle 32G X 4 MM MISC 1 Container by Does not apply route as needed. 1 each prn  . Lancets (ACCU-CHEK SOFT TOUCH) lancets Use as instructed 100 each prn  . levothyroxine (SYNTHROID, LEVOTHROID) 125 MCG tablet Take 1 tablet (125 mcg total) by mouth daily. 90 tablet 3  . metoprolol succinate (TOPROL-XL) 100 MG 24 hr tablet Take 100 mg by mouth daily. Take with or immediately following a meal.    . Probiotic  Product (PROBIOTIC DAILY) CAPS Take 1 capsule by mouth daily.    . valsartan-hydrochlorothiazide (DIOVAN HCT) 160-25 MG per tablet Take 1 tablet by mouth daily. 30 tablet 11  . zolpidem (AMBIEN) 5 MG tablet Take 1 tablet (5 mg total) by mouth at bedtime as needed for sleep. 15 tablet 1   No current facility-administered  medications for this visit.    PHYSICAL EXAMINATION: ECOG PERFORMANCE STATUS: 1 - Symptomatic but completely ambulatory  Filed Vitals:   04/08/16 1218  BP: 148/96  Pulse: 85  Temp: 98.3 F (36.8 C)  Resp: 18   Filed Weights   04/08/16 1218  Weight: 234 lb 11.2 oz (106.459 kg)    GENERAL:alert, no distress and comfortable. She is morbidly obese SKIN: skin color, texture, turgor are normal, no rashes or significant lesions EYES: normal, Conjunctiva are pink and non-injected, sclera clear OROPHARYNX:no exudate, no erythema and lips, buccal mucosa, and tongue normal  NECK: supple, thyroid normal size, non-tender, without nodularity LYMPH:  no palpable lymphadenopathy in the cervical, axillary or inguinal LUNGS: clear to auscultation and percussion with normal breathing effort HEART: regular rate & rhythm and no murmurs and no lower extremity edema ABDOMEN:abdomen soft, non-tender and normal bowel sounds Musculoskeletal:no cyanosis of digits and no clubbing  NEURO: alert & oriented x 3 with fluent speech, no focal motor/sensory deficits  LABORATORY DATA:  I have reviewed the data as listed    Component Value Date/Time   NA 136 04/08/2016 1205   NA 133* 01/27/2015 1200   NA 139 05/28/2012 0818   K 4.7 04/08/2016 1205   K 4.6 01/27/2015 1200   K 4.6 05/28/2012 0818   CL 98 01/27/2015 1200   CL 106 03/28/2013 0954   CL 95* 05/28/2012 0818   CO2 27 04/08/2016 1205   CO2 22 01/27/2015 1200   CO2 28 05/28/2012 0818   GLUCOSE 511* 04/08/2016 1205   GLUCOSE 457* 01/27/2015 1200   GLUCOSE 109* 03/28/2013 0954   GLUCOSE 153* 05/28/2012 0818   BUN 10.8 04/08/2016 1205   BUN 13 01/27/2015 1200   BUN 11 05/28/2012 0818   CREATININE 1.2* 04/08/2016 1205   CREATININE 0.89 01/27/2015 1200   CREATININE 0.7 10/16/2014 1157   CREATININE 0.67 10/08/2007 1250   CALCIUM 9.8 04/08/2016 1205   CALCIUM 9.8 01/27/2015 1200   CALCIUM 9.3 05/28/2012 0818   PROT 7.7 04/08/2016 1205   PROT  6.7 12/03/2013 0954   PROT 7.3 05/28/2012 0818   ALBUMIN 3.8 04/08/2016 1205   ALBUMIN 4.2 12/03/2013 0954   ALBUMIN 3.6 05/28/2012 0818   AST 11 04/08/2016 1205   AST 14 12/03/2013 0954   AST 24 05/28/2012 0818   ALT 17 04/08/2016 1205   ALT 16 12/03/2013 0954   ALT 25 05/28/2012 0818   ALKPHOS 61 04/08/2016 1205   ALKPHOS 56 12/03/2013 0954   ALKPHOS 71 05/28/2012 0818   BILITOT 0.54 04/08/2016 1205   BILITOT 0.5 12/03/2013 0954   BILITOT 0.60 05/28/2012 0818   GFRNONAA >90 04/15/2013 1230   GFRAA >90 04/15/2013 1230    No results found for: SPEP, UPEP  Lab Results  Component Value Date   WBC 4.0 04/08/2016   NEUTROABS 2.1 04/08/2016   HGB 14.2 04/08/2016   HCT 40.3 04/08/2016   MCV 91.0 04/08/2016   PLT 214 04/08/2016      Chemistry      Component Value Date/Time   NA 136 04/08/2016 1205   NA 133* 01/27/2015 1200   NA  139 05/28/2012 0818   K 4.7 04/08/2016 1205   K 4.6 01/27/2015 1200   K 4.6 05/28/2012 0818   CL 98 01/27/2015 1200   CL 106 03/28/2013 0954   CL 95* 05/28/2012 0818   CO2 27 04/08/2016 1205   CO2 22 01/27/2015 1200   CO2 28 05/28/2012 0818   BUN 10.8 04/08/2016 1205   BUN 13 01/27/2015 1200   BUN 11 05/28/2012 0818   CREATININE 1.2* 04/08/2016 1205   CREATININE 0.89 01/27/2015 1200   CREATININE 0.7 10/16/2014 1157   CREATININE 0.67 10/08/2007 1250      Component Value Date/Time   CALCIUM 9.8 04/08/2016 1205   CALCIUM 9.8 01/27/2015 1200   CALCIUM 9.3 05/28/2012 0818   ALKPHOS 61 04/08/2016 1205   ALKPHOS 56 12/03/2013 0954   ALKPHOS 71 05/28/2012 0818   AST 11 04/08/2016 1205   AST 14 12/03/2013 0954   AST 24 05/28/2012 0818   ALT 17 04/08/2016 1205   ALT 16 12/03/2013 0954   ALT 25 05/28/2012 0818   BILITOT 0.54 04/08/2016 1205   BILITOT 0.5 12/03/2013 0954   BILITOT 0.60 05/28/2012 0818       RADIOGRAPHIC STUDIES:CXR is reviewed by myself, no evidence of recurrent, final readings are pending   ASSESSMENT & PLAN:   History of hodgkin's lymphoma She has completed all treatment and recent imaging showed no recurrence. I will transition her care to cancer survivorship clinic next year.   Diabetes type 2, uncontrolled ([place]) She is on close monitoring with PCP and is on medication. She has not been taking her medications as prescribed recently due to loss of insurance and stress taking care of her mother with recent diagnosis of cancer. Currently, she has obtained insurance coverage again. I gave her resources from [place] to help her pay for medications and recommend increased compliance again to get her diabetes under control.  Quality of life palliative care encounter We discussed increase physical activity and possible enrollment with the LiveStrong program with the [facility] I gave her additional resources from the [place]. We discussed importance of vitamin D supplementation.   Cough She has chronic cough of unknown etiology. I will order chest x-ray for further evaluation Her last CT imaging show evidence of radiation related changes.    Orders Placed This Encounter  Procedures  . CBC with Differential/Platelet    Standing Status: Future     Number of Occurrences:      Standing Expiration Date: 05/13/2017  . Comprehensive metabolic panel    Standing Status: Future     Number of Occurrences:      Standing Expiration Date: 05/13/2017  . Lactate dehydrogenase (LDH)    Standing Status: Future     Number of Occurrences:      Standing Expiration Date: 05/13/2017  . Amb Referral to Survivorship Long term    Referral Priority:  Routine    Referral Type:  Consultation    Referred to Provider:  Holley Bouche, NP    Number of Visits Requested:  1   All questions were answered. The patient knows to call the clinic with any problems, questions or concerns. No barriers to learning was detected. I spent 20 minutes counseling the patient face to face. The total  time spent in the appointment was 25 minutes and more than 50% was on counseling and review of test results     [facility], [place], MD 04/08/2016 3:09 PM

## 2016-04-08 NOTE — Telephone Encounter (Signed)
Gave pt avs °

## 2016-04-08 NOTE — Assessment & Plan Note (Signed)
She has completed all treatment and recent imaging showed no recurrence. I will transition her care to cancer survivorship clinic next year.

## 2016-04-08 NOTE — Assessment & Plan Note (Signed)
We discussed increase physical activity and possible enrollment with the [employer]Strong program with the [employer]. I gave her additional resources from the [employer]. We discussed importance of vitamin D supplementation.  

## 2016-04-08 NOTE — Assessment & Plan Note (Addendum)
She has chronic cough of unknown etiology. I will order chest x-ray for further evaluation Her last CT imaging show evidence of radiation related changes.

## 2016-04-08 NOTE — Telephone Encounter (Signed)
LVM for pt informing of elevated Blood Sugar and Creatinine.  Instructed pt to check her blood sugar at home, take her insulin as directed and also increase fluid intake for her kidneys.  Instructed to f/u soon and closely w/ PCP for blood sugar and creatinine.  Informed her CXR results are normal per Dr. Alvy Bimler.  Asked her to please call nurse back for any questions/ concerns.

## 2016-04-19 ENCOUNTER — Ambulatory Visit: Payer: Self-pay | Admitting: Family Medicine

## 2016-04-20 ENCOUNTER — Telehealth: Payer: Self-pay | Admitting: Family Medicine

## 2016-04-20 NOTE — Telephone Encounter (Signed)
APT. REMINDER CALL, LMTCB °

## 2016-04-21 ENCOUNTER — Encounter: Payer: Self-pay | Admitting: Internal Medicine

## 2016-04-21 ENCOUNTER — Ambulatory Visit (INDEPENDENT_AMBULATORY_CARE_PROVIDER_SITE_OTHER): Payer: 59 | Admitting: Internal Medicine

## 2016-04-21 VITALS — BP 162/115 | HR 70 | Temp 98.5°F | Ht 67.0 in | Wt 236.1 lb

## 2016-04-21 DIAGNOSIS — N898 Other specified noninflammatory disorders of vagina: Secondary | ICD-10-CM | POA: Diagnosis not present

## 2016-04-21 DIAGNOSIS — R1031 Right lower quadrant pain: Secondary | ICD-10-CM

## 2016-04-21 DIAGNOSIS — B3731 Acute candidiasis of vulva and vagina: Secondary | ICD-10-CM | POA: Insufficient documentation

## 2016-04-21 DIAGNOSIS — B373 Candidiasis of vulva and vagina: Secondary | ICD-10-CM | POA: Insufficient documentation

## 2016-04-21 DIAGNOSIS — Z794 Long term (current) use of insulin: Secondary | ICD-10-CM

## 2016-04-21 DIAGNOSIS — I10 Essential (primary) hypertension: Secondary | ICD-10-CM

## 2016-04-21 DIAGNOSIS — E1165 Type 2 diabetes mellitus with hyperglycemia: Secondary | ICD-10-CM

## 2016-04-21 DIAGNOSIS — Z7984 Long term (current) use of oral hypoglycemic drugs: Secondary | ICD-10-CM

## 2016-04-21 DIAGNOSIS — IMO0001 Reserved for inherently not codable concepts without codable children: Secondary | ICD-10-CM

## 2016-04-21 DIAGNOSIS — Z9114 Patient's other noncompliance with medication regimen: Secondary | ICD-10-CM

## 2016-04-21 DIAGNOSIS — E1159 Type 2 diabetes mellitus with other circulatory complications: Secondary | ICD-10-CM | POA: Diagnosis not present

## 2016-04-21 DIAGNOSIS — E038 Other specified hypothyroidism: Secondary | ICD-10-CM | POA: Diagnosis not present

## 2016-04-21 DIAGNOSIS — Z3049 Encounter for surveillance of other contraceptives: Secondary | ICD-10-CM

## 2016-04-21 DIAGNOSIS — R1033 Periumbilical pain: Secondary | ICD-10-CM | POA: Diagnosis not present

## 2016-04-21 DIAGNOSIS — I152 Hypertension secondary to endocrine disorders: Secondary | ICD-10-CM

## 2016-04-21 LAB — POCT GLYCOSYLATED HEMOGLOBIN (HGB A1C): Hemoglobin A1C: >14

## 2016-04-21 LAB — GLUCOSE, CAPILLARY: Glucose-Capillary: 284 mg/dL — ABNORMAL HIGH (ref 65–99)

## 2016-04-21 MED ORDER — FLUCONAZOLE 150 MG PO TABS: 150.0000 mg | ORAL_TABLET | ORAL | Status: DC

## 2016-04-21 MED ORDER — VALSARTAN-HYDROCHLOROTHIAZIDE 160-25 MG PO TABS: 1.0000 | ORAL_TABLET | Freq: Every day | ORAL | Status: DC

## 2016-04-21 NOTE — Progress Notes (Signed)
   Subjective:    Patient ID: [name], female    DOB: Oct 13, 1981, 35 y.o.   MRN: [id number]  HPI [name] is a 35 year old female who presents today for abdominal pain and recheck of blood pressure. Please see assessment & plan for status of chronic medical problems.    Review of Systems  Constitutional: Negative for fever, chills and appetite change.  Gastrointestinal: Positive for abdominal pain. Negative for constipation.  Genitourinary: Negative for dysuria, vaginal discharge, vaginal pain and menstrual problem.  Psychiatric/Behavioral: Positive for sleep disturbance.       Objective:   Physical Exam  Constitutional: She appears well-developed and well-nourished.  HENT:  Head: Normocephalic and atraumatic.  Eyes: Conjunctivae are normal. No scleral icterus.  Cardiovascular: Normal rate and regular rhythm.   Pulmonary/Chest: Effort normal. No respiratory distress.  Abdominal: Soft. Bowel sounds are normal. She exhibits no distension. There is tenderness (Mild tenderness to palpation overlying right lower quadrant to the umbilicus).  Genitourinary:  Cervical vault notable for cottage cheese, white discharge consistent with candidiasis. No lesions appreciated otherwise.          Assessment & Plan:

## 2016-04-21 NOTE — Assessment & Plan Note (Addendum)
Assessment Her blood pressure today is 162/1:15 and she reports it has been much higher than this in the past. She inadvertently mixed some of her medications with her mother's medications and would like a refill for her medications. Though she acknowledges being on the medications listed on her list, she only wants to have valsartan/HCTZ 160/25 mg to be taken daily refilled as she is tired of her pill burden. She is very stressed as being the primary caregiver for her mother who is diagnosed with ovarian and endometrial cancer. To reduce her pill burden, she started taking [employer] brand tea.   Blood pressure markedly elevated above goal less than 140/90 given comorbid diabetes. Nonadherence to medications is significant barrier to blood pressure control for her, and we will have to work gradually back up to her triple therapy.  Plan -Refilled valsartan/HCTZ 160/25 mg tablets to be taken one tablet daily -Return in 1 week for blood pressure recheck -Counseled the patient to avoid herbal teas while she is on medication. Per initial Internet search, it appears one of these teas has a diuretic function to it which can further increase the risk of hypovolemia.

## 2016-04-21 NOTE — Patient Instructions (Addendum)
For the blood pressure, please keep taking the combo pill.   For the yeast infection, please take Diflucan 1 tablet today and then on Sunday.  For your IUD, we will get you over to the [facility]   We have rechecked your thyroid and diabetes in preparation for next week.

## 2016-04-21 NOTE — Assessment & Plan Note (Signed)
Assessment Pelvic exam was notable for white, cottage cheese discharge is consistent with candidiasis. She does report a prior history of recurrent infection and is likely in the setting of her poorly controlled diabetes.  Plan -Prescribed fluconazole 150 mg tablets x 2 to be taken 1 tablet every 72 hours

## 2016-04-22 DIAGNOSIS — R109 Unspecified abdominal pain: Secondary | ICD-10-CM | POA: Insufficient documentation

## 2016-04-22 LAB — TSH: TSH: 3.44 u[IU]/mL (ref 0.450–4.500)

## 2016-04-22 NOTE — Assessment & Plan Note (Signed)
Assessment She reports she has an IUD placed and thinks it may be time for replacement. She had one placed back in 2012 she thinks.  Plan -Refer to gynecology

## 2016-04-22 NOTE — Assessment & Plan Note (Signed)
Assessment She reports that her diabetes has been poorly controlled her sometime after she was diagnosed with lymphoma which is consistent with her A1c's note in the chart. Though she reports taking glipizide 5 mg twice daily, she does acknowledge missing doses of Lantus 20-25 units daily at bedtime as she does not do well with needles. She was on metformin at one point which was discontinued because it was making her sick and may been thought to have cross interacted with her chemotherapy regimen. She was told that her oncologist office at her kidney function is worsening and so she started taking [employer] brand herbal tea as well.  Plan -Counseled patient extensively on the risk of letting her diabetes go poorly controlled -Recommend weight loss strategies and dietary discretion. She is agreeable to being referred for clinic diabetic educator for further assistance. -Follow-up in 1 week with plan to further address her glycemic control -Recheck A1c today -Encouraged her to not take herbal teas.  ADDENDUM 04/22/2016  8:03 AM:  A1c greater than 14 which is consistent with her reported sugars.

## 2016-04-22 NOTE — Addendum Note (Signed)
Addended by: Riccardo Dubin on: 04/22/2016 09:56 AM   Modules accepted: Orders

## 2016-04-22 NOTE — Assessment & Plan Note (Signed)
Assessment She is taking Synthroid 125 g daily. Last TSH in the system is dated March 2016 was markedly elevated at 10.9.  Plan -Recheck TSH  ADDENDUM 04/22/2016  8:03 AM:  TSH reassuring at 3.4, improved from 10.9 back in March 2016.

## 2016-04-22 NOTE — Progress Notes (Signed)
Internal Medicine Clinic Attending  Case discussed with Dr. Patel,Rushil at the time of the visit.  We reviewed the resident's history and exam and pertinent patient test results.  I agree with the assessment, diagnosis, and plan of care documented in the resident's note.  

## 2016-04-22 NOTE — Assessment & Plan Note (Signed)
Assessment For the last 2 days, she noticed acute onset of abdominal pain which she scores a 5-6/10. She does not think it is distressing though finds it bothersome. Onset of pain was noted while she was sitting on the toilet and voiding after which he noted some pink spotting. Upon wiping, there is no additional spotting. Pain is localized to the right lower quadrant and extends to the periumbilical area. She denies any change in her appetite, bowel movements, nausea, vomiting, diarrhea, fever, chills, prior appendix or gallbladder surgery, dysuria.  Physical exam findings are reassuring for no acute abdomen. Pelvic exam is also within normal limits.   Plan -Recommended surveillance with plan to follow-up in 1 week to see how the pain evolves -Plan to collect urinalysis though patient had already voided

## 2016-04-28 ENCOUNTER — Encounter: Payer: Self-pay | Admitting: Internal Medicine

## 2016-04-28 ENCOUNTER — Ambulatory Visit (INDEPENDENT_AMBULATORY_CARE_PROVIDER_SITE_OTHER): Payer: 59 | Admitting: Internal Medicine

## 2016-04-28 VITALS — BP 124/76 | HR 74 | Temp 97.8°F | Ht 67.0 in | Wt 233.7 lb

## 2016-04-28 DIAGNOSIS — E1165 Type 2 diabetes mellitus with hyperglycemia: Secondary | ICD-10-CM | POA: Diagnosis not present

## 2016-04-28 DIAGNOSIS — IMO0001 Reserved for inherently not codable concepts without codable children: Secondary | ICD-10-CM

## 2016-04-28 DIAGNOSIS — Z794 Long term (current) use of insulin: Secondary | ICD-10-CM

## 2016-04-28 DIAGNOSIS — R1031 Right lower quadrant pain: Secondary | ICD-10-CM

## 2016-04-28 DIAGNOSIS — I152 Hypertension secondary to endocrine disorders: Secondary | ICD-10-CM

## 2016-04-28 DIAGNOSIS — E1159 Type 2 diabetes mellitus with other circulatory complications: Secondary | ICD-10-CM

## 2016-04-28 DIAGNOSIS — R102 Pelvic and perineal pain: Secondary | ICD-10-CM | POA: Diagnosis not present

## 2016-04-28 DIAGNOSIS — I10 Essential (primary) hypertension: Principal | ICD-10-CM

## 2016-04-28 LAB — GLUCOSE, CAPILLARY: Glucose-Capillary: 349 mg/dL — ABNORMAL HIGH (ref 65–99)

## 2016-04-28 NOTE — Patient Instructions (Signed)
1. Keep up the healthy lifestyle choices (walking 4x weekly, increased water).  Remember to set up an appointment with our Diabetes Educator to talk about diabetes control and nutrition.   2. Please take all medications as prescribed.  It is okay to take your insulin in the morning instead of the evening if this will help you to remember to take it every day.  Please check your blood sugar first thing in the morning and 1-2 more times during the day/night and bring your meter to your next visit so we can adjust your meds accordingly.  3. If you have worsening of your symptoms or new symptoms arise, please call the clinic [phone]), or go to the ER immediately if symptoms are severe.  Please come back to see me in 2 weeks.  Don't forget to bring your blood sugar meter.

## 2016-04-28 NOTE — Assessment & Plan Note (Addendum)
Assessment:  She reports intermittent, mild pelvic pain that is similar to menstrual cramping.  IUD in place for nearly 5 years.  No GI symptoms to suggest GI etiology.   Plan:  referral was placed to GYN by my colleague last week; will follow-up.

## 2016-04-28 NOTE — Progress Notes (Signed)
Subjective:    Patient ID: [name], female    DOB: September 28, 1981, 35 y.o.   MRN: [id number]  HPI Comments: [name] is a 35 year old woman with PMH as below here for 1 week follow-up of HTN, DM and abdominal pain.  Please see problem based charting for the status of these conditions.     Past Medical History  Diagnosis Date  . Hypertension   . Complication of anesthesia SLOW TO WAKE UP AFTER C SECTION  . Benign essential HTN 01/19/2012  . Eczema 01/19/2012  . Muscle spasm of back 03/09/2012  . S/P chemotherapy, time since 4-12 weeks 05/11/2012    4 cycles of ABVD with neulasta support and zoladex  . Cough, persistent 07/27/2012  . Leukopenia 08/01/2012  . Asthma     seasonal, worse in spring. Uses inhaler during this time of year  . Migraine   . DM II (diabetes mellitus, type II), controlled ([place]) 01/19/2012  . Hodgkin lymphoma ([place]) 01/18/2012    left axilla & left neck dx'd 01/16/2012  . Hypothyroidism     06/2014  . History of hodgkin's lymphoma 07/27/2012    Stage IIB dx 2/13 Rx ABVD X 4 then involved field Radiation    Current Outpatient Prescriptions on File Prior to Visit  Medication Sig Dispense Refill  . amLODipine (NORVASC) 2.5 MG tablet Take 1 tablet (2.5 mg total) by mouth daily. (Patient not taking: Reported on 04/21/2016) 30 tablet 11  . fluconazole (DIFLUCAN) 150 MG tablet Take 1 tablet (150 mg total) by mouth every 3 (three) days. 2 tablet 0  . glipiZIDE (GLUCOTROL) 5 MG tablet Take 1 tablet (5 mg total) by mouth 2 (two) times daily before a meal. 60 tablet 1  . glucose blood (ACCU-CHEK AVIVA) test strip Use as instructed 100 each prn  . Insulin Glargine (LANTUS SOLOSTAR) 100 UNIT/ML Solostar Pen Inject 22 Units into the skin daily at 10 pm. (Patient taking differently: Inject 22-25 Units into the skin daily at 10 pm. ) 3 pen 1  . Insulin Pen Needle 32G X 4 MM MISC 1 Container by Does not apply route as needed. 1 each prn  . Lancets (ACCU-CHEK SOFT TOUCH)  lancets Use as instructed 100 each prn  . levothyroxine (SYNTHROID, LEVOTHROID) 125 MCG tablet Take 1 tablet (125 mcg total) by mouth daily. 90 tablet 3  . metoprolol succinate (TOPROL-XL) 100 MG 24 hr tablet Take 100 mg by mouth daily. Reported on 04/21/2016    . Probiotic Product (PROBIOTIC DAILY) CAPS Take 1 capsule by mouth daily.    . valsartan-hydrochlorothiazide (DIOVAN HCT) 160-25 MG tablet Take 1 tablet by mouth daily. 30 tablet 11  . zolpidem (AMBIEN) 5 MG tablet Take 1 tablet (5 mg total) by mouth at bedtime as needed for sleep. 15 tablet 1   No current facility-administered medications on file prior to visit.    Review of Systems  Constitutional: Negative for fever, chills and appetite change.  Gastrointestinal: Negative for nausea, vomiting, diarrhea, constipation and abdominal distention.       No pain with defecation  Endocrine: Negative for polydipsia and polyuria.  Genitourinary: Positive for pelvic pain. Negative for dysuria, hematuria and vaginal bleeding.  Neurological: Negative for syncope and light-headedness.       Filed Vitals:   04/28/16 0919  BP: 124/76  Pulse: 74  Temp: 97.8 F (36.6 C)  TempSrc: Oral  Height: 5\' 7"  (1.702 m)  Weight: 233 lb 11.2 oz (106.006 kg)  SpO2: 97%   Objective:   Physical Exam  Constitutional: She is oriented to person, place, and time. She appears well-developed. No distress.  HENT:  Head: Normocephalic and atraumatic.  Mouth/Throat: Oropharynx is clear and moist. No oropharyngeal exudate.  Eyes: Conjunctivae and EOM are normal. Pupils are equal, round, and reactive to light.  Neck: Neck supple.  Cardiovascular: Normal rate, regular rhythm and normal heart sounds.  Exam reveals no gallop and no friction rub.   No murmur heard. Pulmonary/Chest: Effort normal and breath sounds normal. No respiratory distress. She has no wheezes. She has no rales.  Abdominal: Soft. Bowel sounds are normal. She exhibits no distension and no  mass. There is tenderness. There is no rebound and no guarding.  Mild TTP lower quadrants  Musculoskeletal: She exhibits no edema or tenderness.  Neurological: She is alert and oriented to person, place, and time. No cranial nerve deficit.  Skin: Skin is warm and dry. She is not diaphoretic.  Psychiatric: She has a normal mood and affect. Her behavior is normal. Judgment and thought content normal.  Vitals reviewed.         Assessment & Plan:  Please see problem based charting for A&P.

## 2016-04-28 NOTE — Assessment & Plan Note (Addendum)
BP Readings from Last 3 Encounters:  04/28/16 124/76  04/21/16 162/115  04/08/16 148/96    Lab Results  Component Value Date   NA 136 04/08/2016   K 4.7 04/08/2016   CREATININE 1.2* 04/08/2016    Assessment: Blood pressure control:  well controlled Progress toward BP goal:   at goal Comments: Patient reports increased walking, healthy nutrition.  Compliant with Diovan HCT  Plan: Medications:  continue current medications: Diovan HCT Educational resources provided:   Self management tools provided:   Other plans: Continue lifestyle changes. Recheck next visit.

## 2016-04-28 NOTE — Progress Notes (Signed)
Internal Medicine Clinic Attending  Case discussed with Dr. Wilson soon after the resident saw the patient.  We reviewed the resident's history and exam and pertinent patient test results.  I agree with the assessment, diagnosis, and plan of care documented in the resident's note.  

## 2016-04-28 NOTE — Assessment & Plan Note (Signed)
Lab Results  Component Value Date   HGBA1C >14.0 04/21/2016   HGBA1C 14.8 01/12/2016   HGBA1C 13.8 07/17/2015     Assessment: Diabetes control:  uncontrolled Progress toward A1C goal:   deteriorated Comments: Has taken Lantus only twice this week.  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  She reports compliance with BID glipizide.  She seems genuinely interested in investing more time in her health.  She has increased activity and working on [employer].   Plan: Medications:  continue current medications:  Glipizide 5mg  BID.  Lantus 22 units daily (will move to qAM instead of qHS since she feels taking in AM will increase her compliance). Home glucose monitoring:  yes Frequency:  2-3x daily Timing:  fasting AM, 1-2x later in the day Instruction/counseling given: reminded to bring blood glucose meter & log to each visit Other plans: She will start taking Lantus qAM instead of in the evening to help with compliance.  She now has strips so will begin check blood sugar and bring her meter to next visit in 2 weeks.

## 2016-05-12 ENCOUNTER — Ambulatory Visit (INDEPENDENT_AMBULATORY_CARE_PROVIDER_SITE_OTHER): Payer: 59 | Admitting: Internal Medicine

## 2016-05-12 ENCOUNTER — Encounter: Payer: Self-pay | Admitting: Internal Medicine

## 2016-05-12 VITALS — BP 118/68 | HR 84 | Temp 98.0°F | Ht 67.0 in | Wt 236.0 lb

## 2016-05-12 DIAGNOSIS — Z794 Long term (current) use of insulin: Secondary | ICD-10-CM

## 2016-05-12 DIAGNOSIS — E1165 Type 2 diabetes mellitus with hyperglycemia: Secondary | ICD-10-CM

## 2016-05-12 DIAGNOSIS — IMO0001 Reserved for inherently not codable concepts without codable children: Secondary | ICD-10-CM

## 2016-05-12 LAB — GLUCOSE, CAPILLARY: Glucose-Capillary: 323 mg/dL — ABNORMAL HIGH (ref 65–99)

## 2016-05-12 MED ORDER — GLIPIZIDE 10 MG PO TABS: 10.0000 mg | ORAL_TABLET | Freq: Two times a day (BID) | ORAL | Status: DC

## 2016-05-12 NOTE — Patient Instructions (Signed)
1. Please increase your morning glipizide dose to 10mg  every morning and keep your evening dose at 5mg  for the rest of the week.  Starting next week please increase your evening dose to 10mg  as well so that you are taking 10mg  every morning and 10mg  every evening.  Keep taking the same amount of Lantus but please work on taking it every morning.  If you sugars are < 90 first thing in the morning or if you feel hypoglycemic (sweaty, shaky, lightheaded, etc) with increased dose of glipizide please call us.  Try to put it in a place where you will definitely remember to take it (think of things you do every morning).  Continue to work on increasing your activity and watching your diet which will play a big part in controlling your diabetes.     2. Please take all medications as prescribed.    3. If you have worsening of your symptoms or new symptoms arise, please call the clinic [phone]), or go to the ER immediately if symptoms are severe.  Please return to see the diabetes educator to discuss diabetes and nutrition.  Also, arrange an eye exam with your eye doctor if you have not been seen in the past year.  Please come back to see [place] with your meter in 1 month so we can see how you are doing and make further changes if needed.

## 2016-05-12 NOTE — Assessment & Plan Note (Addendum)
Lab Results  Component Value Date   HGBA1C >14.0 04/21/2016   HGBA1C 14.8 01/12/2016   HGBA1C 13.8 07/17/2015     Assessment: Diabetes control:  uncontrolled Progress toward A1C goal:   deteriorated Comments: Blood glucose logs reviewed.  There are four readings for the past four weeks.  Avg 312, low 254, high 333.  CBG in clinic is 323.  She has two meters (one at home and one at work) says he only brought the home meter today.  She reports AM CBGs mostly in 300s, lowest AM reading was 230s.  She took insulin every AM last week but has missed twice this week due to mom in the hospital.  She does take glipizide BID.  Doing a little better with remembering to eat, but increased stress eating this week with mom in hospital.  Missed appt with DM educator this AM due to running late from picking her mom up from hospital.   Plan: Medications:  continue current medications:  INCREASE glipizide from 5mg  BID to 10mg  BID (will increase to 10 and 5 this week, then 10 and 10 next week).  Continue Lantus 22 units daily. Home glucose monitoring:  yes Frequency:  2-3x daily Timing:  fasting AM, later in day Instruction/counseling given: reminded to bring blood glucose meter & log to each visit, discussed the need for weight loss and discussed diet Other plans: Advised that using 1 meter would be ideal. She will schedule eye exam.  She will reschedule visit with DM educator.  She will return to clinic in 1 month for follow-up and further titration of meds.  May need to consider addition of other non-insulin meds if insulin compliance continues to be an issue.  She has been on Januvia and Byetta in the past; says she did not have ADRs.  She had GI ADR with metformin.

## 2016-05-12 NOTE — Progress Notes (Signed)
Subjective:    Patient ID: [name], female    DOB: March 03, 1981, 35 y.o.   MRN: [id number]  HPI Comments: [name] is a 35 year old woman with PMH as below here for follow-up of DM. Please see problem based charting for the status of this condition.     Past Medical History  Diagnosis Date  . Hypertension   . Complication of anesthesia SLOW TO WAKE UP AFTER C SECTION  . Benign essential HTN 01/19/2012  . Eczema 01/19/2012  . Muscle spasm of back 03/09/2012  . S/P chemotherapy, time since 4-12 weeks 05/11/2012    4 cycles of ABVD with neulasta support and zoladex  . Cough, persistent 07/27/2012  . Leukopenia 08/01/2012  . Asthma     seasonal, worse in spring. Uses inhaler during this time of year  . Migraine   . DM II (diabetes mellitus, type II), controlled ([place]) 01/19/2012  . Hodgkin lymphoma ([place]) 01/18/2012    left axilla & left neck dx'd 01/16/2012  . Hypothyroidism     06/2014  . History of hodgkin's lymphoma 07/27/2012    Stage IIB dx 2/13 Rx ABVD X 4 then involved field Radiation    Current Outpatient Prescriptions on File Prior to Visit  Medication Sig Dispense Refill  . glipiZIDE (GLUCOTROL) 5 MG tablet Take 1 tablet (5 mg total) by mouth 2 (two) times daily before a meal. 60 tablet 1  . glucose blood (ACCU-CHEK AVIVA) test strip Use as instructed 100 each prn  . Insulin Glargine (LANTUS SOLOSTAR) 100 UNIT/ML Solostar Pen Inject 22 Units into the skin daily at 10 pm. (Patient taking differently: Inject 22-25 Units into the skin daily at 10 pm. ) 3 pen 1  . Insulin Pen Needle 32G X 4 MM MISC 1 Container by Does not apply route as needed. 1 each prn  . Lancets (ACCU-CHEK SOFT TOUCH) lancets Use as instructed (Patient not taking: Reported on 04/28/2016) 100 each prn  . levothyroxine (SYNTHROID, LEVOTHROID) 125 MCG tablet Take 1 tablet (125 mcg total) by mouth daily. 90 tablet 3  . Probiotic Product (PROBIOTIC DAILY) CAPS Take 1 capsule by mouth daily. Reported on  04/28/2016    . valsartan-hydrochlorothiazide (DIOVAN HCT) 160-25 MG tablet Take 1 tablet by mouth daily. 30 tablet 11  . zolpidem (AMBIEN) 5 MG tablet Take 1 tablet (5 mg total) by mouth at bedtime as needed for sleep. 15 tablet 1   No current facility-administered medications on file prior to visit.    Review of Systems  Eyes: Negative for visual disturbance.  Respiratory: Negative for cough and shortness of breath.   Cardiovascular: Negative for chest pain and palpitations.  Endocrine: Negative for polydipsia and polyuria.       Filed Vitals:   05/12/16 0955  BP: 118/68  Pulse: 84  Temp: 98 F (36.7 C)  TempSrc: Oral  Height: 5\' 7"  (1.702 m)  Weight: 236 lb (107.049 kg)  SpO2: 98%    Objective:   Physical Exam  Constitutional: She is oriented to person, place, and time. She appears well-developed. No distress.  HENT:  Head: Normocephalic and atraumatic.  Mouth/Throat: Oropharynx is clear and moist. No oropharyngeal exudate.  Eyes: Conjunctivae and EOM are normal. Pupils are equal, round, and reactive to light. No scleral icterus.  Neck: Neck supple.  Cardiovascular: Normal rate, regular rhythm and normal heart sounds.  Exam reveals no gallop and no friction rub.   No murmur heard. Pulmonary/Chest: Effort normal and breath sounds  normal. No respiratory distress. She has no wheezes. She has no rales.  Abdominal: Soft. Bowel sounds are normal. She exhibits no distension. There is no tenderness. There is no rebound and no guarding.  Musculoskeletal: Normal range of motion. She exhibits no edema or tenderness.  Neurological: She is alert and oriented to person, place, and time. No cranial nerve deficit.  Skin: Skin is warm and dry. She is not diaphoretic.  Psychiatric: She has a normal mood and affect. Her behavior is normal. Judgment and thought content normal.  Vitals reviewed.         Assessment & Plan:  Please see problem based charting for A&P.

## 2016-05-13 NOTE — Progress Notes (Signed)
Internal Medicine Clinic Attending  Case discussed with Dr. Wilson soon after the resident saw the patient.  We reviewed the resident's history and exam and pertinent patient test results.  I agree with the assessment, diagnosis, and plan of care documented in the resident's note.  

## 2016-05-13 NOTE — Addendum Note (Signed)
Addended by: Lalla Brothers T on: 05/13/2016 10:38 AM   Modules accepted: Level of Service

## 2016-05-26 ENCOUNTER — Encounter: Payer: Self-pay | Admitting: Dietician

## 2016-05-26 ENCOUNTER — Telehealth: Payer: Self-pay | Admitting: Dietician

## 2016-05-26 NOTE — Telephone Encounter (Signed)
I spoke with [name] re: this patient.  She should also be assigned to a new PCP and get in to an appointment if available in July as noted.

## 2016-05-26 NOTE — Telephone Encounter (Signed)
Could not come to her appointment today and had tried to cancel via automated reminder.  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 She reports that despite increasing her glipizide to 10 mg twice daily and taking her insulin 22 units lantus every am, her fasting blood sugars are high 200s and 300s .   Helped her trouble shoot reasons for high blood sugars. She says She  maintains a yeast infection despite using over the counter medicine and diflucan. She also thinks her thryroid problems may be affecting her blood sugars. She just started a new pen of Lantus this past Monday. Calculated estimated basal insulin dose is ~ 26-65 units/day.   Asked her if she had permission from our doctors if she would be comfortable increasing her lantus 1-2 units a day until her morning blood sugars ~ 150 mg/dl? She agreed to do this if okay with our doctors. She intends to try to get appointments mid July with doctro and diabetes educator.   She is waiting to hear from our front office to get an appointment with Dr. Beryle Beams and desires appointment on same day with diabetes educator/ dietitian.

## 2016-06-01 NOTE — Telephone Encounter (Signed)
Called patient today to make an appt for mid July to follow up for DM.  States she will call back later on today after checking her work schedule.  States that she wanted to see Dr. Beryle Beams, but let her know that I am unable to schedule that appt and would defer that message to [facility].

## 2016-06-28 NOTE — Addendum Note (Signed)
Addended by: Hulan Fray on: 06/28/2016 07:15 PM   Modules accepted: Orders

## 2016-07-14 ENCOUNTER — Ambulatory Visit: Payer: Self-pay | Admitting: Family Medicine

## 2016-07-19 NOTE — Addendum Note (Signed)
Addended by: Hulan Fray on: 07/19/2016 06:34 PM   Modules accepted: Orders

## 2016-08-27 ENCOUNTER — Emergency Department (HOSPITAL_COMMUNITY)
Admission: EM | Admit: 2016-08-27 | Discharge: 2016-08-27 | Disposition: A | Discharge status: Home / Self Care | Payer: 59 | Attending: Emergency Medicine | Admitting: Emergency Medicine

## 2016-08-27 ENCOUNTER — Encounter (HOSPITAL_COMMUNITY): Payer: Self-pay | Admitting: Emergency Medicine

## 2016-08-27 DIAGNOSIS — Z794 Long term (current) use of insulin: Secondary | ICD-10-CM | POA: Insufficient documentation

## 2016-08-27 DIAGNOSIS — Y9339 Activity, other involving climbing, rappelling and jumping off: Secondary | ICD-10-CM | POA: Diagnosis not present

## 2016-08-27 DIAGNOSIS — E039 Hypothyroidism, unspecified: Secondary | ICD-10-CM | POA: Insufficient documentation

## 2016-08-27 DIAGNOSIS — E119 Type 2 diabetes mellitus without complications: Secondary | ICD-10-CM | POA: Diagnosis not present

## 2016-08-27 DIAGNOSIS — Y999 Unspecified external cause status: Secondary | ICD-10-CM | POA: Insufficient documentation

## 2016-08-27 DIAGNOSIS — Y929 Unspecified place or not applicable: Secondary | ICD-10-CM | POA: Insufficient documentation

## 2016-08-27 DIAGNOSIS — Z79899 Other long term (current) drug therapy: Secondary | ICD-10-CM | POA: Diagnosis not present

## 2016-08-27 DIAGNOSIS — S8991XA Unspecified injury of right lower leg, initial encounter: Secondary | ICD-10-CM | POA: Diagnosis present

## 2016-08-27 DIAGNOSIS — X58XXXA Exposure to other specified factors, initial encounter: Secondary | ICD-10-CM | POA: Diagnosis not present

## 2016-08-27 DIAGNOSIS — J45909 Unspecified asthma, uncomplicated: Secondary | ICD-10-CM | POA: Diagnosis not present

## 2016-08-27 DIAGNOSIS — Z7984 Long term (current) use of oral hypoglycemic drugs: Secondary | ICD-10-CM | POA: Diagnosis not present

## 2016-08-27 DIAGNOSIS — I10 Essential (primary) hypertension: Secondary | ICD-10-CM | POA: Diagnosis not present

## 2016-08-27 DIAGNOSIS — Z87891 Personal history of nicotine dependence: Secondary | ICD-10-CM | POA: Insufficient documentation

## 2016-08-27 DIAGNOSIS — S8391XA Sprain of unspecified site of right knee, initial encounter: Secondary | ICD-10-CM | POA: Diagnosis not present

## 2016-08-27 MED ORDER — CYCLOBENZAPRINE HCL 10 MG PO TABS: 10.0000 mg | ORAL_TABLET | Freq: Two times a day (BID) | ORAL | 0 refills | Status: DC | PRN

## 2016-08-27 MED ORDER — IBUPROFEN 800 MG PO TABS: 800.0000 mg | ORAL_TABLET | Freq: Three times a day (TID) | ORAL | 0 refills | Status: DC

## 2016-08-27 MED ORDER — IBUPROFEN 400 MG PO TABS
800.0000 mg | ORAL_TABLET | Freq: Once | ORAL | Status: AC
  Administered 2016-08-27: 800 mg via ORAL
  Filled 2016-08-27: qty 2

## 2016-08-27 NOTE — ED Triage Notes (Addendum)
At playground with family; jumped up and when landed there was pain in the right knee. Did not fall. Tried to put weight on it and it is too painful. Has tried ice and Ibuprofen. No relief.

## 2016-08-27 NOTE — ED Provider Notes (Signed)
[facility] DEPT Provider Note   CSN: [id number] Arrival date & time: 08/27/16  1817  By signing my name below, I, Reola Mosher, attest that this documentation has been prepared under the direction and in the presence of Domenic Moras, PA-C.  Electronically Signed: Reola Mosher, ED Scribe. 08/27/16. 6:50 PM.  History   Chief Complaint Chief Complaint  Patient presents with  . Knee Pain   The history is provided by the patient. No language interpreter was used.   HPI Comments: [name] is a 35 y.o. female with a PMHx of DM2 and HTN, who presents to the Emergency Department complaining of sudden onset right knee pain onset ~1-2 hours ago. Pt reports that she was jumping up to grab an item above her, when she came down on her right knee awkwardly and felt a sudden sharp pain, sustaining her current pain. Pt did not fall to the ground, and there was no LOC or head injury. She states that her pain is moderately alleviated with sitting still, and she applied ice to the area prior to coming into the ED. Her pain is exacerbated with pressing her foot downward and w/ attempted weight bearing. No pain with resting, no numbness She denies any chance of being pregnant. Denies ankle pain, hip pain, numbness, weakness, or any other associated symptoms.   Past Medical History:  Diagnosis Date  . Asthma    seasonal, worse in spring. Uses inhaler during this time of year  . Benign essential HTN 01/19/2012  . Complication of anesthesia SLOW TO WAKE UP AFTER C SECTION  . Cough, persistent 07/27/2012  . DM II (diabetes mellitus, type II), controlled ([place]) 01/19/2012  . Eczema 01/19/2012  . History of hodgkin's lymphoma 07/27/2012   Stage IIB dx 2/13 Rx ABVD X 4 then involved field Radiation   . Hodgkin lymphoma ([place]) 01/18/2012   left axilla & left neck dx'd 01/16/2012  . Hypertension   . Hypothyroidism    06/2014  . Leukopenia 08/01/2012  . Migraine   . Muscle spasm of back  03/09/2012  . S/P chemotherapy, time since 4-12 weeks 05/11/2012   4 cycles of ABVD with neulasta support and zoladex   Patient Active Problem List   Diagnosis Date Noted  . Abdominal pain 04/22/2016  . Candidiasis of vagina 04/21/2016  . Quality of life palliative care encounter 04/08/2016  . Diabetes type 2, uncontrolled ([place]) 07/17/2015  . Boil of buttock 01/28/2015  . Vaginal discharge 01/28/2015  . Chest pain 09/19/2014  . SOB (shortness of breath) 09/05/2014  . Preventive measure 08/07/2014  . Other malaise and fatigue 07/09/2014  . Obesity, Class II, BMI 35-39.9, with comorbidity ([place]) 07/09/2014  . Proteinuria 01/10/2014  . Other specified hypothyroidism 01/10/2014  . Cough 12/04/2013  . Murmur 10/17/2013  . Sleep disorder 10/11/2013  . Headache(784.0) 09/16/2013  . Anxiety state, unspecified 09/16/2013  . Contraception 01/24/2013  . Leukopenia 08/01/2012  . History of hodgkin's lymphoma 07/27/2012  . Pulmonary infiltrates 07/27/2012  . Hypertension associated with diabetes ([place]) 01/19/2012   Past Surgical History:  Procedure Laterality Date  . axillary lymph node biopsy  12/2011   left  . CESAREAN SECTION  2008  . DILATION AND CURETTAGE OF UTERUS     Early 2000s  . PORTACATH PLACEMENT  01/25/2012   Procedure: INSERTION PORT-A-CATH;  Surgeon: Gayland Curry, MD,FACS;  Location: [facility] ORS;  Service: General;  Laterality: Right;  right subclavian   OB History    Durenda Age  Para Term Preterm AB Living   3 1 1   2 1    SAB TAB Ectopic Multiple Live Births   2 0 0 0       Home Medications    Prior to Admission medications   Medication Sig Start Date End Date Taking? Authorizing Provider  cyclobenzaprine (FLEXERIL) 10 MG tablet Take 1 tablet (10 mg total) by mouth 2 (two) times daily as needed for muscle spasms. 08/27/16   Domenic Moras, PA-C  glipiZIDE (GLUCOTROL) 10 MG tablet Take 1 tablet (10 mg total) by mouth 2 (two) times daily before a meal. 05/12/16   Francesca Oman, DO    glucose blood (ACCU-CHEK AVIVA) test strip Use as instructed 07/27/15   Zenia Resides, MD  ibuprofen (ADVIL,MOTRIN) 800 MG tablet Take 1 tablet (800 mg total) by mouth 3 (three) times daily. 08/27/16   Domenic Moras, PA-C  Insulin Glargine (LANTUS SOLOSTAR) 100 UNIT/ML Solostar Pen Inject 22 Units into the skin daily at 10 pm. Patient taking differently: Inject 22-25 Units into the skin daily at 10 pm.  01/12/16   Alveda Reasons, MD  Insulin Pen Needle 32G X 4 MM MISC 1 Container by Does not apply route as needed. 01/12/16   Alveda Reasons, MD  Lancets (ACCU-CHEK SOFT [facility]) lancets Use as instructed Patient not taking: Reported on 04/28/2016 09/10/15   Zenia Resides, MD  levothyroxine (SYNTHROID, LEVOTHROID) 125 MCG tablet Take 1 tablet (125 mcg total) by mouth daily. 07/17/15   Alveda Reasons, MD  Probiotic Product (PROBIOTIC DAILY) CAPS Take 1 capsule by mouth daily. Reported on 04/28/2016    Historical Provider, MD  valsartan-hydrochlorothiazide (DIOVAN HCT) 160-25 MG tablet Take 1 tablet by mouth daily. 04/21/16   Riccardo Dubin, MD  zolpidem (AMBIEN) 5 MG tablet Take 1 tablet (5 mg total) by mouth at bedtime as needed for sleep. 07/17/15   Alveda Reasons, MD   Family History Family History  Problem Relation Age of Onset  . Allergies Mother   . Ovarian cancer Mother 52    Also diagnosed with some blood cancer  . Diabetes Maternal Grandmother   . Heart disease Maternal Grandfather    Social History Social History  Substance Use Topics  . Smoking status: Former Smoker    Packs/day: 0.25    Years: 5.00    Types: Cigarettes    Quit date: 11/28/2002  . Smokeless tobacco: Never Used     Comment: Quit ?2004-2005  . Alcohol use No   Allergies   Imitrex [sumatriptan]; Metformin and related; and Penicillins  Review of Systems Review of Systems  Musculoskeletal: Positive for arthralgias (right knee).  Neurological: Negative for syncope, weakness and numbness.   Physical  Exam Updated Vital Signs There were no vitals taken for this visit.  Physical Exam  Constitutional: She appears well-developed and well-nourished.  HENT:  Head: Normocephalic.  Eyes: Conjunctivae are normal.  Cardiovascular: Normal rate.   Pulmonary/Chest: Effort normal. No respiratory distress.  Abdominal: She exhibits no distension.  Musculoskeletal: She exhibits tenderness.  Right knee with TTP noted to the lateral joint line on palpation. Normal knee flexion and extension. No joint laxity on exam. Right ankle and right hips are normal.   Neurological: She is alert.  Skin: Skin is warm and dry.  Psychiatric: She has a normal mood and affect. Her behavior is normal.  Nursing note and vitals reviewed.  ED Treatments / Results  DIAGNOSTIC STUDIES: Oxygen Saturation is 100% on  RA, normal by my interpretation.   COORDINATION OF CARE: 6:48 PM-Discussed next steps with pt. Pt verbalized understanding and is agreeable with the plan.   Procedures Procedures   Medications Ordered in ED Medications  ibuprofen (ADVIL,MOTRIN) tablet 800 mg (not administered)   Initial Impression / Assessment and Plan / ED Course  I have reviewed the triage vital signs and the nursing notes.  BP 135/72 (BP Location: Left Arm)   Pulse 92   Temp 98.2 F (36.8 C) (Oral)   Resp 16   SpO2 100%    Pertinent labs & imaging results that were available during my care of the patient were reviewed by me and considered in my medical decision making (see chart for details).  Clinical Course   [name] is a 35 y.o. female who presents to the Emergency Department complaining of sudden onset right knee pain s/p mechanical jumping injury that occurred ~1-2 hours prior to evaluation. Exam reveals TTP along the lateral joint aspect of the right knee. Normal flexion and extension and w/o laxity on examination. Pain managed in ED w/ 800mg  Ibuprofen. Pt advised to follow up with orthopedics if symptoms  persist. Patient given brace while in ED, conservative therapy recommended and discussed. Patient will be d/c home & is agreeable with above plan. All questions answered prior to disposition.   Final Clinical Impressions(s) / ED Diagnoses   Final diagnoses:  Right knee sprain, initial encounter   New Prescriptions New Prescriptions   CYCLOBENZAPRINE (FLEXERIL) 10 MG TABLET    Take 1 tablet (10 mg total) by mouth 2 (two) times daily as needed for muscle spasms.   IBUPROFEN (ADVIL,MOTRIN) 800 MG TABLET    Take 1 tablet (800 mg total) by mouth 3 (three) times daily.   I personally performed the services described in this documentation, which was scribed in my presence. The recorded information has been reviewed and is accurate.       Domenic Moras, PA-C 08/27/16 [place], DO 08/27/16 2040

## 2016-08-27 NOTE — Progress Notes (Signed)
Orthopedic Tech Progress Note Patient Details:  [name] Jan 25, 1981 [id number]  Ortho Devices Type of Ortho Device: Crutches Ortho Device/Splint Interventions: Ordered, Adjustment   Braulio Bosch 08/27/2016, 7:06 PM

## 2016-09-06 ENCOUNTER — Ambulatory Visit (INDEPENDENT_AMBULATORY_CARE_PROVIDER_SITE_OTHER): Payer: 59 | Admitting: Internal Medicine

## 2016-09-06 VITALS — BP 150/96 | HR 95 | Temp 98.2°F | Wt 241.5 lb

## 2016-09-06 DIAGNOSIS — E1165 Type 2 diabetes mellitus with hyperglycemia: Secondary | ICD-10-CM

## 2016-09-06 DIAGNOSIS — M25561 Pain in right knee: Secondary | ICD-10-CM | POA: Diagnosis not present

## 2016-09-06 DIAGNOSIS — I152 Hypertension secondary to endocrine disorders: Secondary | ICD-10-CM

## 2016-09-06 DIAGNOSIS — I10 Essential (primary) hypertension: Secondary | ICD-10-CM

## 2016-09-06 DIAGNOSIS — IMO0001 Reserved for inherently not codable concepts without codable children: Secondary | ICD-10-CM

## 2016-09-06 DIAGNOSIS — Z794 Long term (current) use of insulin: Secondary | ICD-10-CM

## 2016-09-06 DIAGNOSIS — E1159 Type 2 diabetes mellitus with other circulatory complications: Secondary | ICD-10-CM

## 2016-09-06 DIAGNOSIS — Z79899 Other long term (current) drug therapy: Secondary | ICD-10-CM

## 2016-09-06 LAB — GLUCOSE, CAPILLARY: Glucose-Capillary: 280 mg/dL — ABNORMAL HIGH (ref 65–99)

## 2016-09-06 LAB — POCT GLYCOSYLATED HEMOGLOBIN (HGB A1C): Hemoglobin A1C: >14

## 2016-09-06 MED ORDER — GLIPIZIDE 10 MG PO TABS: 10.0000 mg | ORAL_TABLET | Freq: Two times a day (BID) | ORAL | 1 refills | Status: DC

## 2016-09-06 MED ORDER — INSULIN GLARGINE 100 UNIT/ML SOLOSTAR PEN: 23.0000 [IU] | PEN_INJECTOR | Freq: Every day | SUBCUTANEOUS | 2 refills | Status: DC

## 2016-09-06 MED ORDER — TRAMADOL HCL 50 MG PO TABS: 50.0000 mg | ORAL_TABLET | Freq: Four times a day (QID) | ORAL | 0 refills | Status: AC | PRN

## 2016-09-06 NOTE — Progress Notes (Signed)
CC: knee pain  HPI:  [name] is a 35 y.o. with a PMH listed below presents with acute R knee pain. The pain began after landing from a jump at the playground with her daughter; she stated that she felt a pop and felt her leg give away. She was evaluated at the ED on 9/30; at the time there was no need for imaging and was thought to be an acute knee sprain; she was sent home with ibuprofen 800mg  TID and flexeril and heat/ice therapy. She reports that the pain is still present, located mainly in her lateral and posterior knee, and is unchanged with flexeril and ibuprofen; though it is helped with heat. 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 She has been using a crutch and a compression wrap which do help. She endorses that the swelling around her knee has decreased but still is present.   DM: Patient with uncontrolled T2DM, last A1c >14, on glipizide 10mg  BID and lantus 23U qAM. She states she is compliant with the medications. She states that her morning CBG's are in the 120-130's. She denies increased thirst, polyphagia, nausea, vomiting. Denies hypoglycemic episodes.  Please see problem based Assessment and Plan for status of patients chronic conditions.  Past Medical History:  Diagnosis Date  . Asthma    seasonal, worse in spring. Uses inhaler during this time of year  . Benign essential HTN 01/19/2012  . Complication of anesthesia SLOW TO WAKE UP AFTER C SECTION  . Cough, persistent 07/27/2012  . DM II (diabetes mellitus, type II), controlled ([place]) 01/19/2012  . Eczema 01/19/2012  . History of Hodgkin's lymphoma 07/27/2012   Stage IIB dx 2/13 Rx ABVD X 4 then involved field Radiation   . Hodgkin lymphoma ([place]) 01/18/2012   left axilla & left neck dx'd 01/16/2012  . Hypertension   . Hypothyroidism    06/2014  . Leukopenia 08/01/2012  . Migraine   . Muscle spasm of back 03/09/2012  . S/P  chemotherapy, time since 4-12 weeks 05/11/2012   4 cycles of ABVD with neulasta support and zoladex    Review of Systems:   Review of Systems  Constitutional: Negative for chills and fever.  Gastrointestinal: Negative for abdominal pain, constipation, diarrhea, nausea and vomiting.  Genitourinary: Negative for frequency and urgency.  Musculoskeletal:       R knee pain, mainly in the lateroposterior region with swelling, describes that knee would give way   Neurological: Negative for tingling and sensory change.  Endo/Heme/Allergies: Negative for polydipsia.    Physical Exam:  Vitals:   09/06/16 0928  BP: (!) 150/96  Pulse: 95  Temp: 98.2 F (36.8 C)  TempSrc: Oral  SpO2: 100%  Weight: 241 lb 8 oz (109.5 kg)   Physical Exam  Constitutional: NAD at rest, appears in discomfort with movement, pleasant CV: RRR, systolic ejection murmur, no rubs or gallops appreciated Resp: CTAB, no increased work of breathing, no crackles or wheezing appreciated Abd: soft, +BS, NDNT MSK: RLE: mild swelling on lateral to patella, no tenderness over patella or edges, tenderness at fibular head and posterior aspect of knee; ROM intact but somewhat limited by pain; pain with valgus and varus stress; unable to appreciate laxity or perform drawer tests due to habitus.  Assessment & Plan:   See Encounters Tab for problem based charting.   Patient seen with  Dr. Andris Baumann, MD Internal Medicine PGY1

## 2016-09-06 NOTE — Patient Instructions (Signed)
It was nice meeting you!  For your knee pain, we placed a referral for orthopedic surgery so they can evaluate it and decide if intervention is appropriate.  In the meantime, you can continue using the ibuprofen and heat.  I gave you a prescription for tramadol to help with the pain. It can cause some constipation and sedation; be careful and avoid driving or operating machinery. For constipation, keep hydrated with plenty of water, and you can use Miralaxx or a similar stool softener as needed.  We checked your A1c today, I will call you with the results.  I also refilled your blood pressure and diabetes medicines. Please keep checking your blood sugars regularly.

## 2016-09-07 MED ORDER — INSULIN GLARGINE 100 UNIT/ML SOLOSTAR PEN: 30.0000 [IU] | PEN_INJECTOR | Freq: Every day | SUBCUTANEOUS | 2 refills | Status: DC

## 2016-09-07 NOTE — Assessment & Plan Note (Signed)
Patient with uncontrolled T2DM on glipizide 10mg  BID and Lantus 23U qAM. Patient reports compliance and denies symptoms of hyper or hypoglycemia. Last A1c >14.   Plan: --Check A1c - >14 again --increase Lantus to 30U --refilled glipizide 10mg  BID --advised patient to check her CBG about 3 times a day and bring in readings to next visit --f/u in 2-4 weeks for medication management

## 2016-09-07 NOTE — Assessment & Plan Note (Addendum)
Patient with acute lateral right knee pain after landing from a jump 10 days prior to presentation. Endorses leg giving away, not able to fully put weight on R leg due to pain. Endorses swelling that has improved, pain improved slightly with heat, but no change with ibuprofen or flexeril. Laxity unable to be appreciated on exam but patient endorses knee giving away with weight bearing. Pain with valgus and varus stress. Symptoms and exam consistent with injury to LCL or meniscus.   Plan: --Tramadol 50mg  q6 --continue heat therapy --ortho referral for evaluation of need for surgical intervention

## 2016-09-07 NOTE — Assessment & Plan Note (Signed)
Patient with history of hypertension managed with valsartan-HCTZ 160-25mg  daily. States compliance with medicine. BP elevated in clinic today, but might be from pain.  Plan: --continue valsartan-HCTZ 160-25mg  daily --recheck at next visit

## 2016-09-25 NOTE — Progress Notes (Signed)
I saw and evaluated the patient. I personally confirmed the key portions of Dr. Svalina's history and exam and reviewed pertinent patient test results. The assessment, diagnosis, and plan were formulated together and I agree with the documentation in the resident's note. 

## 2016-10-03 ENCOUNTER — Other Ambulatory Visit: Payer: Self-pay | Admitting: Family Medicine

## 2016-10-03 ENCOUNTER — Ambulatory Visit: Payer: Self-pay

## 2016-11-02 HISTORY — PX: KNEE SURGERY: SHX244

## 2016-12-08 LAB — HM DIABETES EYE EXAM

## 2016-12-19 ENCOUNTER — Telehealth: Payer: Self-pay | Admitting: Hematology and Oncology

## 2016-12-19 ENCOUNTER — Other Ambulatory Visit: Payer: Self-pay | Admitting: Adult Health

## 2016-12-19 NOTE — Telephone Encounter (Signed)
Left message re May appointments. Schedule mailed.  °

## 2016-12-26 ENCOUNTER — Telehealth: Payer: Self-pay | Admitting: Internal Medicine

## 2016-12-26 NOTE — Telephone Encounter (Signed)
APT. REMINDER CALL, LMTCB °

## 2016-12-27 ENCOUNTER — Ambulatory Visit (INDEPENDENT_AMBULATORY_CARE_PROVIDER_SITE_OTHER): Payer: 59 | Admitting: Internal Medicine

## 2016-12-27 ENCOUNTER — Encounter: Payer: Self-pay | Admitting: Internal Medicine

## 2016-12-27 VITALS — BP 118/71 | HR 111 | Temp 97.9°F | Ht 67.0 in | Wt 229.0 lb

## 2016-12-27 DIAGNOSIS — Z79899 Other long term (current) drug therapy: Secondary | ICD-10-CM

## 2016-12-27 DIAGNOSIS — F329 Major depressive disorder, single episode, unspecified: Secondary | ICD-10-CM

## 2016-12-27 DIAGNOSIS — Z8571 Personal history of Hodgkin lymphoma: Secondary | ICD-10-CM

## 2016-12-27 DIAGNOSIS — Z87891 Personal history of nicotine dependence: Secondary | ICD-10-CM

## 2016-12-27 DIAGNOSIS — E1165 Type 2 diabetes mellitus with hyperglycemia: Secondary | ICD-10-CM

## 2016-12-27 DIAGNOSIS — I10 Essential (primary) hypertension: Secondary | ICD-10-CM

## 2016-12-27 DIAGNOSIS — M25561 Pain in right knee: Secondary | ICD-10-CM | POA: Diagnosis not present

## 2016-12-27 DIAGNOSIS — Z8739 Personal history of other diseases of the musculoskeletal system and connective tissue: Secondary | ICD-10-CM

## 2016-12-27 DIAGNOSIS — Z9114 Patient's other noncompliance with medication regimen: Secondary | ICD-10-CM | POA: Diagnosis not present

## 2016-12-27 DIAGNOSIS — Z794 Long term (current) use of insulin: Secondary | ICD-10-CM | POA: Diagnosis not present

## 2016-12-27 DIAGNOSIS — F339 Major depressive disorder, recurrent, unspecified: Secondary | ICD-10-CM | POA: Diagnosis not present

## 2016-12-27 DIAGNOSIS — IMO0001 Reserved for inherently not codable concepts without codable children: Secondary | ICD-10-CM

## 2016-12-27 DIAGNOSIS — Z975 Presence of (intrauterine) contraceptive device: Secondary | ICD-10-CM | POA: Insufficient documentation

## 2016-12-27 LAB — POCT GLYCOSYLATED HEMOGLOBIN (HGB A1C): Hemoglobin A1C: >14

## 2016-12-27 LAB — GLUCOSE, CAPILLARY: Glucose-Capillary: 434 mg/dL — ABNORMAL HIGH (ref 65–99)

## 2016-12-27 MED ORDER — SERTRALINE HCL 50 MG PO TABS: 50.0000 mg | ORAL_TABLET | Freq: Every day | ORAL | 2 refills | Status: DC

## 2016-12-27 NOTE — Patient Instructions (Signed)
It was a pleasure seeing you today!   1. Today we talked about your diabetes. Your Hemoglobin A1c was >14% which is double what is recommended. I'm glad that you have been taking your Glipizide however I need you to start taking your insulin again. Please take Lantus 30 units at bedtime everynight. 2. In addition, I would like you to check your blood sugars once daily. Please check them in the morning and with meals. 3. Today we also talked about your depression. It is a perfectly normal thing to experience, especially during times of stress. For this, please take Zoloft 50 mg once daily. We may need to increase this dose in about a month.  4. Please follow up here in clinic in 1 month.

## 2016-12-27 NOTE — Progress Notes (Signed)
CC: follow-up of diabetes, depression  HPI:  [name] is a very pleasant 36 y.o. F with medical history as described below who presents for follow-up evaluation of diabetes and recent onset depression.   Right Knee Pain: 2/2 ruptured bakers cyst s/p surgical repair 11/01/16. Ambulatory for 2 weeks at this time and doing "okay". Still complains of pain and swelling however much improved. See's physical therapy and has 3 more weeks remaining. Recently was able to return to work with knee brace.   Depression: The patient complains of anhedonia, labile moods, frequent crying, decreased appetite, lack of motivation and dysphoria over the past 2-3 months. She attributes this to not being able to work secondary to her knee pain/surgery and spending a lot of time at home thinking. She has tried to work on this at home however was unable to improve her mood. She reports a history of depression about 10-15 years ago which was treated with Zoloft. Does not remember however if this medication worked for her nor her dose. Reports she feels its time to start a medication temporarily to get her through this low time. Denies any suicidal ideations.   Type 2 Diabetes: Patient has history of very poorly controlled type 2 diabetes with most HbA1c near or >14%. Patient reports non-compliance with her Lantus 30 units QHS for the past 2 months or so secondary to her depression as described above. She reports she has also been eating poorly and unable to exercise due to the above complaints. She does report compliance with Glipizide 10 mg BID however. Todays HbA1c >14% and GBC 400. She denies any nausea, vomiting or abdominal pain. Patient expresses desire to get back on track.   Presence of IUD: Has had Mirena device in for approximately 4.5 years and inquires if she should have this removed and a new one replaced.   History of Hodgkins Lymphoma: Follows with Dr. Alvy Bimler of onc.   Hypertension: Endorses  compliance with Valsartan-HCTZ and BP today normotensive.   Past Medical History:  Diagnosis Date  . Asthma    seasonal, worse in spring. Uses inhaler during this time of year  . Benign essential HTN 01/19/2012  . Complication of anesthesia SLOW TO WAKE UP AFTER C SECTION  . Cough, persistent 07/27/2012  . DM II (diabetes mellitus, type II), controlled ([place]) 01/19/2012  . Eczema 01/19/2012  . History of Hodgkin's lymphoma 07/27/2012   Stage IIB dx 2/13 Rx ABVD X 4 then involved field Radiation   . Hodgkin lymphoma ([place]) 01/18/2012   left axilla & left neck dx'd 01/16/2012  . Hypertension   . Hypothyroidism    06/2014  . Leukopenia 08/01/2012  . Migraine   . Muscle spasm of back 03/09/2012  . S/P chemotherapy, time since 4-12 weeks 05/11/2012   4 cycles of ABVD with neulasta support and zoladex    Review of Systems: Review of Systems  Constitutional: Positive for malaise/fatigue. Negative for chills, fever and weight loss.  Respiratory: Negative for cough, shortness of breath and wheezing.   Cardiovascular: Negative for chest pain, palpitations and leg swelling.  Gastrointestinal: Negative for abdominal pain, blood in stool, nausea and vomiting.  Neurological: Negative for dizziness, tingling and headaches.  Psychiatric/Behavioral: Positive for depression. Negative for substance abuse and suicidal ideas. The patient has insomnia.    Physical Exam: Physical Exam  Constitutional: She appears well-developed and well-nourished. No distress.  HENT:  Head: Normocephalic and atraumatic.  Mouth/Throat: No oropharyngeal exudate.  Cardiovascular: Normal rate, regular  rhythm, normal heart sounds and intact distal pulses.   Pulmonary/Chest: Effort normal and breath sounds normal. No respiratory distress. She has no wheezes.  Abdominal: Soft. Bowel sounds are normal. She exhibits no distension. There is no tenderness.  Skin: Skin is warm and dry. She is not diaphoretic.  Psychiatric:  Dysthymic mood  + somewhat flat affect. Does appear depressed. Thought content normal and patient had good insight and judgement.     Vitals:   12/27/16 1328  BP: 118/71  Pulse: (!) 111  Temp: 97.9 F (36.6 C)  TempSrc: Oral  SpO2: 99%  Weight: 229 lb (103.9 kg)  Height: 5\' 7"  (1.702 m)   Assessment & Plan:   See Encounters Tab for problem based charting.  Patient discussed with Dr. Eppie Gibson

## 2016-12-29 DIAGNOSIS — F331 Major depressive disorder, recurrent, moderate: Secondary | ICD-10-CM | POA: Insufficient documentation

## 2016-12-29 DIAGNOSIS — F329 Major depressive disorder, single episode, unspecified: Secondary | ICD-10-CM | POA: Insufficient documentation

## 2016-12-29 NOTE — Assessment & Plan Note (Signed)
Present for the past 4.5 years and is due to be removed shortly. Pt expresses desire to have another IUD placed. -Referred to OB/GYN for removal & replacement -- also could consider having PAP at same time. -Encouraged back up birth control

## 2016-12-29 NOTE — Progress Notes (Signed)
Patient ID: [name], female   DOB: 05-24-1981, 36 y.o.   MRN: [id number]  Case discussed with Dr. Danford Bad at the time of the visit.  We reviewed the resident's history and exam and pertinent patient test results.  I agree with the assessment, diagnosis and plan of care documented in the resident's note.

## 2016-12-29 NOTE — Assessment & Plan Note (Signed)
Several month history of progressive anhedonia, dysphoric mood, labile moods with frequent crying or other outbursts, decr appeteite and difficulty sleeping. Pt believes due to several month history of being immobile and unable to work secondary to knee surgery. Was on Zoloft before 10+ years ago and doesn't remember if this was effective.  -Will start Zoloft 50 mg today and will f/u within the next few weeks -may need to increase to 100 mg OR change antidepressants at follow-up depending on her response (Some response: increase, no response: Change)

## 2016-12-29 NOTE — Assessment & Plan Note (Signed)
HbA1c today >14% and has history of the same for several years. Reports approximately 2 months of Lantus non-compliance due to depression and subsequent lack of motivation to care for herself. Has however been taking Glipizide 10 mg BID. Good discussion was had with the patient re: the importance of insulin compliance and glycemic control. She voiced desire to get back on track at to resume checking her blood sugar frequently and taking lantus.  Good discussion had with the patient re: glucose monitoring. Instructed to check before meals throughout the week so that we get several values before bfast, lunch and dinner with only checking glucose once daily.  -Patient to resume Lantus 30 units -Patient to continue Glipizide 10 mg BID -Patient to monitor glucose and bring meter to her next appointment

## 2017-01-23 ENCOUNTER — Telehealth: Payer: Self-pay | Admitting: Internal Medicine

## 2017-01-23 NOTE — Telephone Encounter (Signed)
APT. REMINDER CALL, LMTCB °

## 2017-01-24 ENCOUNTER — Ambulatory Visit: Payer: Self-pay

## 2017-02-22 ENCOUNTER — Ambulatory Visit (INDEPENDENT_AMBULATORY_CARE_PROVIDER_SITE_OTHER): Payer: 59 | Admitting: Advanced Practice Midwife

## 2017-02-22 ENCOUNTER — Encounter: Payer: Self-pay | Admitting: Advanced Practice Midwife

## 2017-02-22 ENCOUNTER — Other Ambulatory Visit (HOSPITAL_COMMUNITY)
Admission: RE | Admit: 2017-02-22 | Discharge: 2017-02-22 | Disposition: A | Discharge status: Home / Self Care | Payer: 59 | Source: Ambulatory Visit | Attending: Advanced Practice Midwife | Admitting: Advanced Practice Midwife

## 2017-02-22 VITALS — BP 131/82 | HR 73 | Ht 67.0 in | Wt 228.3 lb

## 2017-02-22 DIAGNOSIS — Z01419 Encounter for gynecological examination (general) (routine) without abnormal findings: Secondary | ICD-10-CM

## 2017-02-22 DIAGNOSIS — Z124 Encounter for screening for malignant neoplasm of cervix: Secondary | ICD-10-CM

## 2017-02-22 DIAGNOSIS — Z1151 Encounter for screening for human papillomavirus (HPV): Secondary | ICD-10-CM | POA: Insufficient documentation

## 2017-02-22 DIAGNOSIS — T8332XA Displacement of intrauterine contraceptive device, initial encounter: Secondary | ICD-10-CM

## 2017-02-22 NOTE — Patient Instructions (Signed)
We will call you with ultrasound results and determine if your IUD can be removed in the office or in the operating room.   Pap Test Why am I having this test? A pap test is sometimes called a pap smear. It is a screening test that is used to check for signs of cancer of the vagina, cervix, and uterus. The test can also identify the presence of infection or precancerous changes. Your health care provider will likely recommend you have this test done on a regular basis. This test may be done:  Every 3 years, starting at age 23.  Every 5 years, in combination with testing for the presence of human papillomavirus (HPV).  More or less often depending on other medical conditions. What kind of sample is taken? Using a small cotton swab, plastic spatula, or brush, your health care provider will collect a sample of cells from the surface of your cervix. Your cervix is the opening to your uterus, also called a womb. Secretions from the cervix and vagina may also be collected. How do I prepare for this test?  Be aware of where you are in your menstrual cycle. You may be asked to reschedule the test if you are menstruating on the day of the test.  You may need to reschedule if you have a known vaginal infection on the day of the test.  You may be asked to avoid douching or taking a bath the day before or the day of the test.  Some medicines can cause abnormal test results, such as digitalis and tetracycline. Talk with your health care provider before your test if you take one of these medicines. What do the results mean? Abnormal test results may indicate a number of health conditions. These may include:  Cancer. Although pap test results cannot be used to diagnose cancer of the cervix, vagina, or uterus, they may suggest the possibility of cancer. Further tests would be required to determine if cancer is present.  Sexually transmitted disease.  Fungal infection.  Parasite infection.  Herpes  infection.  A condition causing or contributing to infertility. It is your responsibility to obtain your test results. Ask the lab or department performing the test when and how you will get your results. Contact your health care provider to discuss any questions you have about your results. Talk with your health care provider to discuss your results, treatment options, and if necessary, the need for more tests. Talk with your health care provider if you have any questions about your results. This information is not intended to replace advice given to you by your health care provider. Make sure you discuss any questions you have with your health care provider. Document Released: 02/04/2003 Document Revised: 07/20/2016 Document Reviewed: 04/07/2014 Elsevier Interactive Patient Education  2017 [employer].

## 2017-02-22 NOTE — Progress Notes (Signed)
Subjective:     Patient ID: [name], female   DOB: 04-Aug-1981, 36 y.o.   MRN: [id number]  HPI: Here for Pap smear (unsure date of last Pap) Denies Hx abnormal Pap. Want to have Mirena IUD removed and replaced.    Review of Systems  Constitutional: Negative for chills and fever.  Gastrointestinal: Negative for abdominal pain.  Genitourinary: Negative for vaginal bleeding and vaginal discharge.      BP 131/82   Pulse 73   Ht 5\' 7"  (1.702 m)   Wt 228 lb 4.8 oz (103.6 kg)   BMI 35.76 kg/m   Objective:   Physical Exam  Constitutional: She is oriented to person, place, and time. She appears well-developed and well-nourished. No distress.  Cardiovascular: Normal rate.   Pulmonary/Chest: Effort normal. No respiratory distress.  Abdominal: Soft. She exhibits no distension. There is no tenderness.  Genitourinary: Vagina normal and uterus normal. There is no lesion on the right labia. There is no lesion on the left labia. Uterus is not enlarged and not tender. Cervix exhibits no motion tenderness, no discharge and no friability. Right adnexum displays no mass and no tenderness. Left adnexum displays no mass and no tenderness. No bleeding in the vagina. No vaginal discharge found.  Neurological: She is alert and oriented to person, place, and time.  Skin: Skin is warm and dry.  Psychiatric: She has a normal mood and affect.  Nursing note and vitals reviewed. IUD strings not visible. Attempted to locate strings w/ cytobrush, string-finder and hemostats w/out success. Scant bleeding. Pt tolerated well.      Assessment:     1. Women's annual routine gynecological examination  - Cytology - PAP  2. Intrauterine contraceptive device threads lost, initial encounter  - US Pelvis Complete; Future - US Transvaginal Non-OB; Future  3. Screening for cervical cancer - Pap     Plan:     Will cal pt w/ [place] results and determine if pt needs removal of IUD in OR.     [place],  CNM 02/22/2017 11:22 AM

## 2017-02-24 ENCOUNTER — Encounter: Payer: Self-pay | Admitting: Advanced Practice Midwife

## 2017-02-24 LAB — CYTOLOGY - PAP
Diagnosis: NEGATIVE
HPV: NOT DETECTED

## 2017-03-02 ENCOUNTER — Ambulatory Visit (HOSPITAL_COMMUNITY)
Admission: RE | Admit: 2017-03-02 | Discharge: 2017-03-02 | Disposition: A | Discharge status: Home / Self Care | Payer: 59 | Source: Ambulatory Visit | Attending: Advanced Practice Midwife | Admitting: Advanced Practice Midwife

## 2017-03-02 DIAGNOSIS — X58XXXA Exposure to other specified factors, initial encounter: Secondary | ICD-10-CM | POA: Diagnosis not present

## 2017-03-02 DIAGNOSIS — T8332XA Displacement of intrauterine contraceptive device, initial encounter: Secondary | ICD-10-CM

## 2017-03-13 ENCOUNTER — Telehealth: Payer: Self-pay | Admitting: *Deleted

## 2017-03-13 DIAGNOSIS — Z975 Presence of (intrauterine) contraceptive device: Secondary | ICD-10-CM

## 2017-03-13 NOTE — Telephone Encounter (Signed)
Patient left message, would like ultrasound results. Also wants to know what needs to happen going forward as she is trying to schedule around work. Please return call

## 2017-03-13 NOTE — Telephone Encounter (Signed)
Patient called again, u/s results given to her. Patient wants to know what needs to happen now, her work schedules a week in advance and she wants to make sure she can get the time off for the IUD removal/replacement. I told her I would get in touch with Manya Silvas, CNM and give her a call back when I get a response. Understanding voiced.

## 2017-03-14 NOTE — Telephone Encounter (Signed)
Returned call from pt to discussed results of US performed for IUD strings not found, but pt did not answer.   US shows Nml placement of IUD. Upon review of Epic, Mirena was placed 04/2013 and has not expired yet. CNM consulted w/ Dr. Kennon Rounds who recommends that pt schedule office visit w/ surgeon in one year to check for strings again. If they cannot be reached, removal can be scheduled in OR by the surgeon at that time. This message was routed to [facility].

## 2017-03-14 NOTE — Telephone Encounter (Signed)
-----   Message from [place], [place] sent at 02/24/2017  7:16 PM EDT ----- Regarding: IUD Strings not seen.

## 2017-03-16 NOTE — Telephone Encounter (Signed)
Patient called front desk and I returned her call. Discussed with patient that the u/s shows the IUD in good position, and was placed in 2014 so it was still good for another year. Manya Silvas recommends waiting till next year and scheduling an appointment with a surgeon to reassess the strings. If at that time the strings cannot be located then they could schedule her OR time to remove the IUD. Patient voiced understanding.

## 2017-03-16 NOTE — Telephone Encounter (Signed)
Attempted to call patient, there was no answer. Message left stating I am calling her with information she requested. Please return my call at the clinic.

## 2017-04-06 ENCOUNTER — Other Ambulatory Visit: Payer: Self-pay

## 2017-04-13 ENCOUNTER — Encounter: Payer: Self-pay | Admitting: Adult Health

## 2017-04-13 ENCOUNTER — Ambulatory Visit (HOSPITAL_BASED_OUTPATIENT_CLINIC_OR_DEPARTMENT_OTHER): Payer: 59 | Admitting: Adult Health

## 2017-04-13 ENCOUNTER — Ambulatory Visit (HOSPITAL_BASED_OUTPATIENT_CLINIC_OR_DEPARTMENT_OTHER): Payer: 59

## 2017-04-13 VITALS — BP 132/79 | HR 80 | Temp 98.3°F | Resp 18 | Ht 67.0 in | Wt 228.4 lb

## 2017-04-13 DIAGNOSIS — E1165 Type 2 diabetes mellitus with hyperglycemia: Secondary | ICD-10-CM

## 2017-04-13 DIAGNOSIS — Z8571 Personal history of Hodgkin lymphoma: Secondary | ICD-10-CM

## 2017-04-13 LAB — COMPREHENSIVE METABOLIC PANEL
ALT: 16 U/L (ref 0–55)
ANION GAP: 8 meq/L (ref 3–11)
AST: 11 U/L (ref 5–34)
Albumin: 3.8 g/dL (ref 3.5–5.0)
Alkaline Phosphatase: 79 U/L (ref 40–150)
BUN: 11.9 mg/dL (ref 7.0–26.0)
CALCIUM: 9.3 mg/dL (ref 8.4–10.4)
CHLORIDE: 100 meq/L (ref 98–109)
CO2: 26 mEq/L (ref 22–29)
Creatinine: 1 mg/dL (ref 0.6–1.1)
EGFR: 85 mL/min/{1.73_m2} — ABNORMAL LOW (ref >90)
Glucose: 410 mg/dl — ABNORMAL HIGH (ref 70–140)
POTASSIUM: 4.5 meq/L (ref 3.5–5.1)
Sodium: 134 mEq/L — ABNORMAL LOW (ref 136–145)
Total Bilirubin: 0.62 mg/dL (ref 0.20–1.20)
Total Protein: 7.3 g/dL (ref 6.4–8.3)

## 2017-04-13 LAB — CBC WITH DIFFERENTIAL/PLATELET
BASO%: 0.8 % (ref 0.0–2.0)
BASOS ABS: 0 10*3/uL (ref 0.0–0.1)
EOS%: 3.5 % (ref 0.0–7.0)
Eosinophils Absolute: 0.2 10*3/uL (ref 0.0–0.5)
HEMATOCRIT: 40.5 % (ref 34.8–46.6)
HGB: 14 g/dL (ref 11.6–15.9)
LYMPH#: 1.3 10*3/uL (ref 0.9–3.3)
LYMPH%: 29.7 % (ref 14.0–49.7)
MCH: 31.5 pg (ref 25.1–34.0)
MCHC: 34.5 g/dL (ref 31.5–36.0)
MCV: 91.4 fL (ref 79.5–101.0)
MONO#: 0.4 10*3/uL (ref 0.1–0.9)
MONO%: 8.9 % (ref 0.0–14.0)
NEUT#: 2.5 10*3/uL (ref 1.5–6.5)
NEUT%: 57.1 % (ref 38.4–76.8)
Platelets: 218 10*3/uL (ref 145–400)
RBC: 4.43 10*6/uL (ref 3.70–5.45)
RDW: 12.4 % (ref 11.2–14.5)
WBC: 4.4 10*3/uL (ref 3.9–10.3)

## 2017-04-13 LAB — LACTATE DEHYDROGENASE: LDH: 152 U/L (ref 125–245)

## 2017-04-13 NOTE — Progress Notes (Signed)
CLINIC:  Survivorship   REASON FOR VISIT:  Routine follow-up for history of lymphoma.   BRIEF ONCOLOGIC HISTORY:  Oncology History   Hodgkin's lymphoma   Primary site: Lymphoid Neoplasms (Left)   Staging method: AJCC 6th Edition   Clinical: Stage II   Summary: Stage II       History of Hodgkin's lymphoma   01/11/2012 Procedure    [id number] Left axillary LN biopsy showed Hodgkin lymphoma      01/24/2012 Imaging    Ct scan of chest, abdomen and pelvis & PET scan showed bulky left axillary LN, supraclavicular lymphadenoapthy and medistinal involvement      02/03/2012 - 05/11/2012 Chemotherapy    She completed 4 cycles of ABVD      05/28/2012 Imaging    PET scan showed partial response      06/18/2012 - 07/16/2012 Radiation Therapy    She completed radiation treatment      07/27/2012 Imaging    CT scan of chest showed pneumonia      03/28/2013 Imaging    Ct chest showed no recurrence      04/15/2013 Imaging    Ct angiogram showed pneumonia      06/04/2013 Imaging    PET scan showed complete response      08/06/2013 Imaging    Ct scan showed no recurrence      02/04/2014 Imaging    Ct chest showed no recurrence      07/31/2014 Imaging    Ct chest showed no recurrence       INTERVAL HISTORY:  Ms. Ellwood presents to the [facility] today for routine follow-up for her history of lymphoma.  Overall, she reports feeling quite well. She was initially diagnosed in 2013.  She had a waxing and waning lump in her axilla.  She was also diagnosed with diabetes.  This lump was ignored for over a year.  She went to her PCP with the lump the size of a golf ball.  She went to the hospital, and it was determined it was an enlarged lymph node.  She underwent biopsy and was referred med onc.  She was fatigued at presentation but no weight loss, night sweats, fevers, or pain.  Her blood sugar is 410 today.  She has not taken her insulin today.      REVIEW OF SYSTEMS:    Review of Systems  Constitutional: Negative for appetite change, chills, diaphoresis, fatigue, fever and unexpected weight change.  HENT:   Negative for hearing loss and lump/mass.   Eyes: Negative for eye problems and icterus.  Respiratory: Negative for chest tightness, cough and shortness of breath.   Cardiovascular: Negative for chest pain, leg swelling and palpitations.  Gastrointestinal: Negative for abdominal distention.  Endocrine: Negative for hot flashes.  Musculoskeletal: Negative for arthralgias and gait problem.  Skin: Negative for itching and rash.  Neurological: Negative for dizziness, gait problem, headaches and speech difficulty.  Hematological: Negative for adenopathy. Does not bruise/bleed easily.  GU: Denies vaginal bleeding, discharge, or dryness.  Breast: Denies any new nodularity, masses, tenderness, nipple changes, or nipple discharge.     PAST MEDICAL/SURGICAL HISTORY:  Past Medical History:  Diagnosis Date  . Asthma    seasonal, worse in spring. Uses inhaler during this time of year  . Benign essential HTN 01/19/2012  . Complication of anesthesia SLOW TO WAKE UP AFTER C SECTION  . Cough, persistent 07/27/2012  . DM II (diabetes mellitus, type II), controlled ([place]) 01/19/2012  .  Eczema 01/19/2012  . History of Hodgkin's lymphoma 07/27/2012   Stage IIB dx 2/13 Rx ABVD X 4 then involved field Radiation   . Hodgkin lymphoma ([place]) 01/18/2012   left axilla & left neck dx'd 01/16/2012  . Hypertension   . Hypothyroidism    06/2014  . Leukopenia 08/01/2012  . Migraine   . Muscle spasm of back 03/09/2012  . S/P chemotherapy, time since 4-12 weeks 05/11/2012   4 cycles of ABVD with neulasta support and zoladex   Past Surgical History:  Procedure Laterality Date  . axillary lymph node biopsy  12/2011   left  . CESAREAN SECTION  2008  . DILATION AND CURETTAGE OF UTERUS     Early 2000s  . KNEE SURGERY Right 11/02/2016   Torn [place]  . PORTACATH PLACEMENT  01/25/2012    Procedure: INSERTION PORT-A-CATH;  Surgeon: Gayland Curry, MD,FACS;  Location: [facility];  Service: General;  Laterality: Right;  right subclavian     ALLERGIES:  Allergies  Allergen Reactions  . Imitrex [Sumatriptan] Nausea Only  . Metformin And Related Diarrhea  . Penicillins Rash     CURRENT MEDICATIONS:  Outpatient Encounter Prescriptions as of 04/13/2017  Medication Sig Note  . cyclobenzaprine (FLEXERIL) 10 MG tablet Take 1 tablet (10 mg total) by mouth 2 (two) times daily as needed for muscle spasms.   Marland Kitchen glipiZIDE (GLUCOTROL) 10 MG tablet Take 1 tablet (10 mg total) by mouth 2 (two) times daily before a meal.   . glucose blood (ACCU-CHEK AVIVA) test strip Use as instructed   . ibuprofen (ADVIL,MOTRIN) 800 MG tablet Take 1 tablet (800 mg total) by mouth 3 (three) times daily.   . Insulin Glargine (LANTUS SOLOSTAR) 100 UNIT/ML Solostar Pen Inject 30 Units into the skin daily at 10 pm.   . Insulin Pen Needle 32G X 4 MM MISC 1 Container by Does not apply route as needed.   . Lancets (ACCU-CHEK SOFT TOUCH) lancets Use as instructed   . levothyroxine (SYNTHROID, LEVOTHROID) 125 MCG tablet TAKE 1 TABLET(125 MCG) BY MOUTH DAILY   . methocarbamol (ROBAXIN) 500 MG tablet    . oxyCODONE (OXY IR/ROXICODONE) 5 MG immediate release tablet    . Probiotic Product (PROBIOTIC DAILY) CAPS Take 1 capsule by mouth daily. Reported on 04/28/2016   . sertraline (ZOLOFT) 50 MG tablet Take 1 tablet (50 mg total) by mouth daily. 02/22/2017: Takes as needed  . valsartan-hydrochlorothiazide (DIOVAN HCT) 160-25 MG tablet Take 1 tablet by mouth daily.   Marland Kitchen zolpidem (AMBIEN) 5 MG tablet Take 1 tablet (5 mg total) by mouth at bedtime as needed for sleep.    No facility-administered encounter medications on file as of 04/13/2017.      ONCOLOGIC FAMILY HISTORY:  Family History  Problem Relation Age of Onset  . Allergies Mother   . Ovarian cancer Mother 49       Also diagnosed with some blood cancer  . Diabetes  Maternal Grandmother   . Heart disease Maternal Grandfather       SOCIAL HISTORY:  JAILANI HOGANS is married and lives with her husband, daughter, and mom in [place], [place].  Ms. Pistole  has one 73 year old daughter.  Currently working full time as a Freight forwarder at [employer].  Denies any current or history of tobacco, alcohol, or illicit drug use.   PHYSICAL EXAMINATION:  Vital Signs: Vitals:   04/13/17 1411  BP: 132/79  Pulse: 80  Resp: 18  Temp: 98.3  F (36.8 C)   Filed Weights   04/13/17 1411  Weight: 228 lb 6.4 oz (103.6 kg)   General: Well-nourished, well-appearing female in no acute distress. Unaccompanied today. HEENT: Head is normocephalic.  Pupils equal and reactive to light. Conjunctivae clear without exudate.  Sclerae anicteric. Oral mucosa is pink, moist.  Oropharynx is pink without lesions or erythema.  Lymph: No cervical, supraclavicular, infraclavicular, axillary, or inguinal lymphadenopathy noted on palpation.  Cardiovascular: Regular rate and rhythm.Marland Kitchen Respiratory: Clear to auscultation bilaterally. Chest expansion symmetric; breathing non-labored.  GI: Abdomen soft and round; non-tender, non-distended. Bowel sounds normoactive. No hepatosplenomegaly.   GU: Deferred.  Neuro: No focal deficits. Steady gait.  Psych: Mood and affect normal and appropriate for situation.  Extremities: No edema. Skin: Warm and dry.   LABORATORY DATA:  Appointment on 04/13/2017  Component Date Value Ref Range Status  . WBC 04/13/2017 4.4  3.9 - 10.3 10e3/uL Final  . NEUT# 04/13/2017 2.5  1.5 - 6.5 10e3/uL Final  . HGB 04/13/2017 14.0  11.6 - 15.9 g/dL Final  . HCT 04/13/2017 40.5  34.8 - 46.6 % Final  . Platelets 04/13/2017 218  145 - 400 10e3/uL Final  . MCV 04/13/2017 91.4  79.5 - 101.0 fL Final  . MCH 04/13/2017 31.5  25.1 - 34.0 pg Final  . MCHC 04/13/2017 34.5  31.5 - 36.0 g/dL Final  . RBC 04/13/2017 4.43  3.70 - 5.45 10e6/uL Final  . RDW  04/13/2017 12.4  11.2 - 14.5 % Final  . lymph# 04/13/2017 1.3  0.9 - 3.3 10e3/uL Final  . MONO# 04/13/2017 0.4  0.1 - 0.9 10e3/uL Final  . Eosinophils Absolute 04/13/2017 0.2  0.0 - 0.5 10e3/uL Final  . Basophils Absolute 04/13/2017 0.0  0.0 - 0.1 10e3/uL Final  . NEUT% 04/13/2017 57.1  38.4 - 76.8 % Final  . LYMPH% 04/13/2017 29.7  14.0 - 49.7 % Final  . MONO% 04/13/2017 8.9  0.0 - 14.0 % Final  . EOS% 04/13/2017 3.5  0.0 - 7.0 % Final  . BASO% 04/13/2017 0.8  0.0 - 2.0 % Final  . Sodium 04/13/2017 134* 136 - 145 mEq/L Final  . Potassium 04/13/2017 4.5  3.5 - 5.1 mEq/L Final  . Chloride 04/13/2017 100  98 - 109 mEq/L Final  . CO2 04/13/2017 26  22 - 29 mEq/L Final  . Glucose 04/13/2017 410* 70 - 140 mg/dl Final   Glucose reference range is for nonfasting patients. Fasting glucose reference range is 70- 100.  Marland Kitchen BUN 04/13/2017 11.9  7.0 - 26.0 mg/dL Final  . Creatinine 04/13/2017 1.0  0.6 - 1.1 mg/dL Final  . Total Bilirubin 04/13/2017 0.62  0.20 - 1.20 mg/dL Final  . Alkaline Phosphatase 04/13/2017 79  40 - 150 U/L Final  . AST 04/13/2017 11  5 - 34 U/L Final  . ALT 04/13/2017 16  0 - 55 U/L Final  . Total Protein 04/13/2017 7.3  6.4 - 8.3 g/dL Final  . Albumin 04/13/2017 3.8  3.5 - 5.0 g/dL Final  . Calcium 04/13/2017 9.3  8.4 - 10.4 mg/dL Final  . Anion Gap 04/13/2017 8  3 - 11 mEq/L Final  . EGFR 04/13/2017 85* >90 ml/min/1.73 m2 Final   eGFR is calculated using the CKD-EPI Creatinine Equation (2009)  . LDH 04/13/2017 152  125 - 245 U/L Final    DIAGNOSTIC IMAGING:  None for this visit     ASSESSMENT AND PLAN:  Ms.. French is a pleasant 36 y.o. female  with history of Stage II hodgkin lymphoma, diagnosed in 2013; treated with chemotherapy and radiation. MaryBaltz presents to the [facility] for surveillance and routine follow-up.   1. History of hodgkin lymphoma:  Ms. Arntson is currently clinically and radiographically without evidence of disease or  recurrence of lymphoma. She will follow-up in the Survivorship Clinic in 1 year with labs, history, and physical exam per surveillance protocol.  I encouraged her to call me with any questions or concerns before her next visit at the cancer center, and I would be happy to see the patient sooner, if needed.    2. Uncontrolled diabetes:  Patient appears to have issues with non compliance.  She is taking her medications, but not her insulin, I instructed her to take her insulin and re check her sugars, as above 400 is a blood sugar we typically will send patients to the ER for.  She says that her body is used to her sugars being this elevated.  She will go home and take her insulin, and if not improved is to call her PCP.  I encouraged compliance with her insulin.    3. Cancer screening:  Due to Ms. Bentsen's history and age, she should receive screening for skin cancers, breast cancer, colon cancer (mammo again 8 years after radiation completion), and gynecologic cancers. The patient was encouraged to follow-up with her PCP for appropriate cancer screenings.   4. Health maintenance and wellness promotion: Ms. Cowman was encouraged to consume 5-7 servings of fruits and vegetables per day. The patient was also encouraged to engage in moderate to vigorous exercise for 30 minutes per day most days of the week. Ms. Wigley was instructed to limit her alcohol consumption and continue to abstain from tobacco use.    Dispo:  -Return to cancer center to see Survivorship NP in 1 year   A total of (30) minutes of face-to-face time was spent with this patient with greater than 50% of that time in counseling and care-coordination.   Gardenia Phlegm, NP Survivorship Program [facility] [phone]   Note: PRIMARY CARE PROVIDER Einar Gip, [place] [phone]

## 2017-05-23 ENCOUNTER — Other Ambulatory Visit: Payer: Self-pay | Admitting: Internal Medicine

## 2017-05-23 DIAGNOSIS — I152 Hypertension secondary to endocrine disorders: Secondary | ICD-10-CM

## 2017-05-23 DIAGNOSIS — I10 Essential (primary) hypertension: Principal | ICD-10-CM

## 2017-05-23 DIAGNOSIS — E1159 Type 2 diabetes mellitus with other circulatory complications: Secondary | ICD-10-CM

## 2017-07-04 ENCOUNTER — Encounter: Payer: Self-pay | Admitting: Internal Medicine

## 2017-07-04 NOTE — Progress Notes (Deleted)
   CC: ***  HPI:  [name] is a 36 y.o. F with medical history as outlined below who presents for follow-up of her chronic medical conditions.   DM2: Most recent HbA1c >14% 11/2016 a which time the patient had not taken her insulin nor glizipide in months secondary to depression. She was started on zoloft and presents today for follow up. Needs foot exam.   HTN: Valsartan-HCTZ 160-25mg .   Depression: Started on Zoloft 50 mg 12/2016.  Past Medical History:  Diagnosis Date  . Asthma    seasonal, worse in spring. Uses inhaler during this time of year  . Benign essential HTN 01/19/2012  . Complication of anesthesia SLOW TO WAKE UP AFTER C SECTION  . Cough, persistent 07/27/2012  . DM II (diabetes mellitus, type II), controlled ([place]) 01/19/2012  . Eczema 01/19/2012  . History of Hodgkin's lymphoma 07/27/2012   Stage IIB dx 2/13 Rx ABVD X 4 then involved field Radiation   . Hodgkin lymphoma ([place]) 01/18/2012   left axilla & left neck dx'd 01/16/2012  . Hypertension   . Hypothyroidism    06/2014  . Leukopenia 08/01/2012  . Migraine   . Muscle spasm of back 03/09/2012  . S/P chemotherapy, time since 4-12 weeks 05/11/2012   4 cycles of ABVD with neulasta support and zoladex   Review of Systems:  General: Denies fevers, chills, weight loss, fatigue HEENT: Denies changes in vision, sore throat, dysphagia Cardiac: Denies CP, SOB, palpitations Pulmonary: Denies cough, wheezes, PND Abd: Denies diarrhea, constipation, changes in bowels Extremities: Denies weakness or swelling  Physical Exam: General: Alert, in no acute distress. Pleasant and conversant HEENT: No icterus, injection or ptosis. No hoarseness or dysarthria  Cardiac: RRR, no MGR appreciated Pulmonary: CTA BL with normal WOB on RA. Able to speak in complete sentences Abd: Soft, non-tended. +bs Extremities: Warm, perfused. No significant pedal edema.   There were no vitals filed for this visit. There is no height or  weight on file to calculate BMI.  ***  Assessment & Plan:   See Encounters Tab for problem based charting.  Patient {GC/GE:[id number]::"discussed with","seen with"} Dr. {NAMES:[id number]::"Butcher","Granfortuna","E. Hoffman","Klima","Mullen","Narendra","Raines","Vincent"}

## 2018-01-18 ENCOUNTER — Encounter: Payer: Self-pay | Admitting: Family Medicine

## 2018-01-18 ENCOUNTER — Telehealth: Payer: Self-pay

## 2018-01-18 ENCOUNTER — Other Ambulatory Visit: Payer: Self-pay

## 2018-01-18 ENCOUNTER — Ambulatory Visit (INDEPENDENT_AMBULATORY_CARE_PROVIDER_SITE_OTHER): Payer: 59 | Admitting: Family Medicine

## 2018-01-18 VITALS — BP 128/86 | HR 88 | Temp 98.3°F | Ht 67.5 in | Wt 222.8 lb

## 2018-01-18 DIAGNOSIS — R109 Unspecified abdominal pain: Secondary | ICD-10-CM

## 2018-01-18 DIAGNOSIS — F39 Unspecified mood [affective] disorder: Secondary | ICD-10-CM | POA: Diagnosis not present

## 2018-01-18 DIAGNOSIS — E1165 Type 2 diabetes mellitus with hyperglycemia: Secondary | ICD-10-CM

## 2018-01-18 LAB — POCT URINALYSIS DIP (MANUAL ENTRY)
BILIRUBIN UA: NEGATIVE
Ketones, POC UA: NEGATIVE mg/dL
LEUKOCYTES UA: NEGATIVE
NITRITE UA: NEGATIVE
Protein Ur, POC: NEGATIVE mg/dL
Spec Grav, UA: 1.01 (ref 1.010–1.025)
Urobilinogen, UA: 0.2 E.U./dL
pH, UA: 6 (ref 5.0–8.0)

## 2018-01-18 LAB — POCT UA - MICROSCOPIC ONLY

## 2018-01-18 LAB — POCT GLYCOSYLATED HEMOGLOBIN (HGB A1C): Hemoglobin A1C: 13.2

## 2018-01-18 MED ORDER — CANAGLIFLOZIN 100 MG PO TABS: 100.0000 mg | ORAL_TABLET | Freq: Every day | ORAL | 1 refills | Status: DC

## 2018-01-18 NOTE — Progress Notes (Signed)
Subjective:    [name] is a 37 y.o. female who presents to [facility] today to re-establish care and FU on diabetes:  - Since I last saw her:  She's been having lots of issues with family.  Mother with cancer, living with patient.  Lots of stressors at home and with work.  Admits to not caring for herself well.  Hasn't used her insuliln or any DM meds "for some time."  Denies any polyuria/polydipsia symptoms, though does have nocturia 2-3 x nightly.  No paresthesias.  Some muscle cramps with this.  Told nurse "flank pain" but not actual flank pain.  No dysuria.  No abd pain.   Also reports anxiety and some depression.  Ongoing issues.  NO SI/HI.  Mainly describes as stemming from poor self-care and stress.   Lab Results  Component Value Date   HGBA1C 13.2 01/18/2018   ROS as above per HPI.  Pertinently, no chest pain, palpitations, SOB, Fever, Chills, Abd pain, N/V/D.   Fam Hx:  Mother with cancer and HTN.  No immediate family history of diabetes.  The following portions of the patient's history were reviewed and updated as appropriate: allergies, current medications, past medical history, family and social history, and problem list. Patient is a nonsmoker.    PMH reviewed.  Past Medical History:  Diagnosis Date  . Asthma    seasonal, worse in spring. Uses inhaler during this time of year  . Benign essential HTN 01/19/2012  . Complication of anesthesia SLOW TO WAKE UP AFTER C SECTION  . Cough, persistent 07/27/2012  . DM II (diabetes mellitus, type II), controlled ([place]) 01/19/2012  . Eczema 01/19/2012  . History of Hodgkin's lymphoma 07/27/2012   Stage IIB dx 2/13 Rx ABVD X 4 then involved field Radiation   . Hodgkin lymphoma ([place]) 01/18/2012   left axilla & left neck dx'd 01/16/2012  . Hypertension   . Hypothyroidism    06/2014  . Leukopenia 08/01/2012  . Migraine   . Muscle spasm of back 03/09/2012  . S/P chemotherapy, time since 4-12 weeks 05/11/2012   4 cycles of ABVD with  neulasta support and zoladex   Past Surgical History:  Procedure Laterality Date  . axillary lymph node biopsy  12/2011   left  . CESAREAN SECTION  2008  . DILATION AND CURETTAGE OF UTERUS     Early 2000s  . KNEE SURGERY Right 11/02/2016   Torn Maniscus  . PORTACATH PLACEMENT  01/25/2012   Procedure: INSERTION PORT-A-CATH;  Surgeon: Gayland Curry, MD,FACS;  Location: [facility];  Service: General;  Laterality: Right;  right subclavian    Medications reviewed. Current Outpatient Medications  Medication Sig Dispense Refill  . cyclobenzaprine (FLEXERIL) 10 MG tablet Take 1 tablet (10 mg total) by mouth 2 (two) times daily as needed for muscle spasms. 20 tablet 0  . glipiZIDE (GLUCOTROL) 10 MG tablet Take 1 tablet (10 mg total) by mouth 2 (two) times daily before a meal. 60 tablet 1  . glucose blood (ACCU-CHEK AVIVA) test strip Use as instructed 100 each prn  . ibuprofen (ADVIL,MOTRIN) 800 MG tablet Take 1 tablet (800 mg total) by mouth 3 (three) times daily. 21 tablet 0  . Insulin Glargine (LANTUS SOLOSTAR) 100 UNIT/ML Solostar Pen Inject 30 Units into the skin daily at 10 pm. 3 pen 2  . Insulin Pen Needle 32G X 4 MM MISC 1 Container by Does not apply route as needed. 1 each prn  . Lancets (ACCU-CHEK SOFT TOUCH)  lancets Use as instructed 100 each prn  . levothyroxine (SYNTHROID, LEVOTHROID) 125 MCG tablet TAKE 1 TABLET BY MOUTH EVERY DAY 90 tablet 0  . methocarbamol (ROBAXIN) 500 MG tablet     . oxyCODONE (OXY IR/ROXICODONE) 5 MG immediate release tablet     . Probiotic Product (PROBIOTIC DAILY) CAPS Take 1 capsule by mouth daily. Reported on 04/28/2016    . sertraline (ZOLOFT) 50 MG tablet Take 1 tablet (50 mg total) by mouth daily. 30 tablet 2  . valsartan-hydrochlorothiazide (DIOVAN-HCT) 160-25 MG tablet TAKE 1 TABLET BY MOUTH DAILY 90 tablet 1  . zolpidem (AMBIEN) 5 MG tablet Take 1 tablet (5 mg total) by mouth at bedtime as needed for sleep. 15 tablet 1   No current facility-administered  medications for this visit.      Objective:   Physical Exam BP 128/86 (BP Location: Left Arm, Patient Position: Sitting, Cuff Size: Large)   Pulse 88 Comment: Manually  Temp 98.3 F (36.8 C) (Oral)   Ht 5' 7.5" (1.715 m)   Wt 222 lb 12.8 oz (101.1 kg)   BMI 34.38 kg/m  Gen:  Alert, cooperative patient who appears stated age in no acute distress.  Vital signs reviewed. HEENT: EOMI,  MMM Cardiac:  Regular rate and rhythm without murmur auscultated.  Good S1/S2. Pulm:  Clear to auscultation bilaterally with good air movement.  No wheezes or rales noted.   Abd:  Soft/nondistended/nontender.   Exts: Non edematous BL  LE, warm and well perfused.   Results for orders placed or performed in visit on 01/18/18 (from the past 72 hour(s))  HgB A1c     Status: Abnormal   Collection Time: 01/18/18 10:13 AM  Result Value Ref Range   Hemoglobin A1C 13.2     Imp/Plan: 1. DM2: - not on any meds currently - needle phobic (mild) led to decreased use of insulin.  Now not taking her insulin at all - retry metformin.  Had diarrhea with this previously.   - adding invokana.    - she is more likely to do once a week injectable.  Can attempt GLP-4 if insurance will cover pending invokana/metformin usage - counseled on lifestyle modifications.    2.  Stress/mood disorder: - interested in counseling therapy here - will contact [facility] to contact patient - she is following up with me in 2 weeks, can see [facility] then, before we meet, or both

## 2018-01-18 NOTE — Telephone Encounter (Signed)
Patient called nurse line and Metformin was not sent to her pharmacy. Also said the invokana did not go through insurance. I have not seen a PA for this yet. Please send Metformin. Danley Danker, RN [facility] RN)

## 2018-01-18 NOTE — Patient Instructions (Addendum)
It was very good to see you again today!  I will have Verdis Frederickson the [employer] Consultant reach out to you.  Take the Metformin 500 mg once every other day.  Then move to once daily for 3 days.  Then start twice a day.    Take the Invokana once a day in the AM before your first meal.  This is a low dose.  Start this on an in between day from the Metformin.   Come back and see me in 2-3 weeks.

## 2018-01-19 LAB — CBC WITH DIFFERENTIAL/PLATELET
Basophils Absolute: 0 10*3/uL (ref 0.0–0.2)
Basos: 1 %
EOS (ABSOLUTE): 0.1 10*3/uL (ref 0.0–0.4)
EOS: 3 %
HEMATOCRIT: 42.4 % (ref 34.0–46.6)
Hemoglobin: 14.1 g/dL (ref 11.1–15.9)
IMMATURE GRANULOCYTES: 0 %
Immature Grans (Abs): 0 10*3/uL (ref 0.0–0.1)
Lymphocytes Absolute: 1.3 10*3/uL (ref 0.7–3.1)
Lymphs: 35 %
MCH: 30.9 pg (ref 26.6–33.0)
MCHC: 33.3 g/dL (ref 31.5–35.7)
MCV: 93 fL (ref 79–97)
MONOS ABS: 0.3 10*3/uL (ref 0.1–0.9)
Monocytes: 7 %
NEUTROS PCT: 54 %
Neutrophils Absolute: 2 10*3/uL (ref 1.4–7.0)
PLATELETS: 238 10*3/uL (ref 150–379)
RBC: 4.57 x10E6/uL (ref 3.77–5.28)
RDW: 13 % (ref 12.3–15.4)
WBC: 3.8 10*3/uL (ref 3.4–10.8)

## 2018-01-19 LAB — COMPREHENSIVE METABOLIC PANEL
ALK PHOS: 65 IU/L (ref 39–117)
ALT: 12 IU/L (ref 0–32)
AST: 12 IU/L (ref 0–40)
Albumin/Globulin Ratio: 1.5 (ref 1.2–2.2)
Albumin: 4.4 g/dL (ref 3.5–5.5)
BUN/Creatinine Ratio: 15 (ref 9–23)
BUN: 13 mg/dL (ref 6–20)
Bilirubin Total: 0.4 mg/dL (ref 0.0–1.2)
CO2: 20 mmol/L (ref 20–29)
CREATININE: 0.88 mg/dL (ref 0.57–1.00)
Calcium: 9.8 mg/dL (ref 8.7–10.2)
Chloride: 97 mmol/L (ref 96–106)
GFR calc Af Amer: 99 mL/min/{1.73_m2} (ref >59)
GFR calc non Af Amer: 86 mL/min/{1.73_m2} (ref >59)
GLOBULIN, TOTAL: 3 g/dL (ref 1.5–4.5)
GLUCOSE: 460 mg/dL — AB (ref 65–99)
Potassium: 4.7 mmol/L (ref 3.5–5.2)
SODIUM: 134 mmol/L (ref 134–144)
Total Protein: 7.4 g/dL (ref 6.0–8.5)

## 2018-01-19 LAB — TSH: TSH: 7.94 u[IU]/mL — ABNORMAL HIGH (ref 0.450–4.500)

## 2018-01-19 LAB — LIPID PANEL
CHOLESTEROL TOTAL: 222 mg/dL — AB (ref 100–199)
Chol/HDL Ratio: 2.6 ratio (ref 0.0–4.4)
HDL: 87 mg/dL (ref >39)
LDL Calculated: 118 mg/dL — ABNORMAL HIGH (ref 0–99)
TRIGLYCERIDES: 87 mg/dL (ref 0–149)
VLDL Cholesterol Cal: 17 mg/dL (ref 5–40)

## 2018-01-22 MED ORDER — METFORMIN HCL 500 MG PO TABS: ORAL_TABLET | ORAL | 1 refills | Status: DC

## 2018-01-22 MED ORDER — SAXAGLIPTIN HCL 5 MG PO TABS: 5.0000 mg | ORAL_TABLET | Freq: Every day | ORAL | 1 refills | Status: DC

## 2018-01-22 NOTE — Telephone Encounter (Signed)
Received PA from [facility]. Tradjenta and Onglyza are preferred. Do you want to send an alternative or attempt a PA? Danley Danker, RN [facility] RN)

## 2018-01-22 NOTE — Telephone Encounter (Signed)
Patient aware. Does PCP want her to take Metformin along with the Onglyza? If so, prescription needs to be sent. Danley Danker, RN [facility] RN)

## 2018-01-22 NOTE — Telephone Encounter (Signed)
I have sent this in

## 2018-01-22 NOTE — Telephone Encounter (Signed)
Lmovm for pt to return call. Adraine Biffle, Salome Spotted, CMA

## 2018-01-22 NOTE — Telephone Encounter (Signed)
I have sent in [place]an.   Please call patient and say we will use this first as it is covered by insurance.  If it doesn't work as well, we can then do the invokana.

## 2018-01-23 ENCOUNTER — Telehealth: Payer: Self-pay

## 2018-01-23 NOTE — Telephone Encounter (Signed)
Patient left message on nurse line requesting call from PCP. States new medication is $300 even with her insurance. Would like to discuss options. Danley Danker, RN [facility] RN)

## 2018-01-25 ENCOUNTER — Encounter: Payer: Self-pay | Admitting: Family Medicine

## 2018-01-25 NOTE — Telephone Encounter (Signed)
Tried to call patient back.  No answer.  Left message to call us back.   We will need to change her back to Glipizide as the other medications are too expensive.  It would also be good to know if she is doing well with the metformin.

## 2018-02-01 ENCOUNTER — Telehealth: Payer: Self-pay

## 2018-02-01 NOTE — Telephone Encounter (Signed)
Pt called nurse line, states she has stopped taking both metformin and Invokana because they were causing headaches, nausea and vomiting. Wants to know what MD wants to do next. Pt's call back [place] Meredith B Thomsen, RN

## 2018-02-02 ENCOUNTER — Telehealth: Payer: Self-pay | Admitting: Psychology

## 2018-02-02 NOTE — Telephone Encounter (Signed)
Called patient per Dr. Cindra Presume request to set up appointment with [facility]. Patient was still interested in coming for [facility] services and is scheduled for Thursday 3/21 with Deborah at 9 AM.

## 2018-02-09 ENCOUNTER — Other Ambulatory Visit: Payer: Self-pay

## 2018-02-09 ENCOUNTER — Ambulatory Visit (INDEPENDENT_AMBULATORY_CARE_PROVIDER_SITE_OTHER): Payer: 59 | Admitting: Family Medicine

## 2018-02-09 ENCOUNTER — Encounter: Payer: Self-pay | Admitting: Family Medicine

## 2018-02-09 VITALS — BP 112/84 | HR 76 | Temp 98.4°F | Ht 68.0 in | Wt 223.6 lb

## 2018-02-09 DIAGNOSIS — F39 Unspecified mood [affective] disorder: Secondary | ICD-10-CM

## 2018-02-09 DIAGNOSIS — Z794 Long term (current) use of insulin: Secondary | ICD-10-CM

## 2018-02-09 DIAGNOSIS — M545 Low back pain: Secondary | ICD-10-CM

## 2018-02-09 DIAGNOSIS — E1165 Type 2 diabetes mellitus with hyperglycemia: Secondary | ICD-10-CM

## 2018-02-09 DIAGNOSIS — IMO0001 Reserved for inherently not codable concepts without codable children: Secondary | ICD-10-CM

## 2018-02-09 LAB — POCT URINALYSIS DIP (MANUAL ENTRY)
Bilirubin, UA: NEGATIVE
Blood, UA: NEGATIVE
Ketones, POC UA: NEGATIVE mg/dL
Leukocytes, UA: NEGATIVE
Nitrite, UA: NEGATIVE
SPEC GRAV UA: 1.015 (ref 1.010–1.025)
UROBILINOGEN UA: 0.2 U/dL
pH, UA: 6 (ref 5.0–8.0)

## 2018-02-09 MED ORDER — TRAMADOL HCL 50 MG PO TABS: 50.0000 mg | ORAL_TABLET | Freq: Three times a day (TID) | ORAL | 1 refills | Status: DC | PRN

## 2018-02-09 MED ORDER — GLIPIZIDE 10 MG PO TABS: ORAL_TABLET | ORAL | 1 refills | Status: DC

## 2018-02-09 MED ORDER — CYCLOBENZAPRINE HCL 10 MG PO TABS: 10.0000 mg | ORAL_TABLET | Freq: Two times a day (BID) | ORAL | 1 refills | Status: DC | PRN

## 2018-02-09 NOTE — Telephone Encounter (Signed)
Discussed with her in clinic today.  See OV notes.

## 2018-02-09 NOTE — Progress Notes (Signed)
Subjective:    [name] is a 37 y.o. female who presents to [facility] today for diabetes:  1. Diabetes:  Currently on Invokana for a few days -- she was unable to afford the invokana at first, and also unable to tolerate the Metformin.  No adverse effects from Invokana  No hypoglycemic events.  No paresthesia or peripheral nerve pain.  No polyuria/polydipsia.  Measures blood sugars at home every day or two, states last was 128.     Lab Results  Component Value Date   HGBA1C 13.2 01/18/2018   2.  Low back pain:  Continues.  Worse with straightening, better when bending forward or supporting herself or rest.  Has tried heat, massage for relief, which do help some.  Also tried Alleve and APAP without relief.  No fevers or chills. No dysuria.  No polyuria/polydipsia.  No bladder/bowel incontinence or saddle anesthesia.    ROS as above per HPI.    The following portions of the patient's history were reviewed and updated as appropriate: allergies, current medications, past medical history, family and social history, and problem list. Patient is a nonsmoker.    PMH reviewed.  Past Medical History:  Diagnosis Date  . Asthma    seasonal, worse in spring. Uses inhaler during this time of year  . Benign essential HTN 01/19/2012  . Complication of anesthesia SLOW TO WAKE UP AFTER C SECTION  . Cough, persistent 07/27/2012  . DM II (diabetes mellitus, type II), controlled ([place]) 01/19/2012  . Eczema 01/19/2012  . History of Hodgkin's lymphoma 07/27/2012   Stage IIB dx 2/13 Rx ABVD X 4 then involved field Radiation   . Hodgkin lymphoma ([place]) 01/18/2012   left axilla & left neck dx'd 01/16/2012  . Hypertension   . Hypothyroidism    06/2014  . Leukopenia 08/01/2012  . Migraine   . Muscle spasm of back 03/09/2012  . S/P chemotherapy, time since 4-12 weeks 05/11/2012   4 cycles of ABVD with neulasta support and zoladex   Past Surgical History:  Procedure Laterality Date  . axillary lymph node biopsy   12/2011   left  . CESAREAN SECTION  2008  . DILATION AND CURETTAGE OF UTERUS     Early 2000s  . KNEE SURGERY Right 11/02/2016   Torn Maniscus  . PORTACATH PLACEMENT  01/25/2012   Procedure: INSERTION PORT-A-CATH;  Surgeon: Gayland Curry, MD,FACS;  Location: [facility];  Service: General;  Laterality: Right;  right subclavian    Medications reviewed. Current Outpatient Medications  Medication Sig Dispense Refill  . canagliflozin (INVOKANA) 100 MG TABS tablet Take 1 tablet (100 mg total) by mouth daily before breakfast. 30 tablet 1  . cyclobenzaprine (FLEXERIL) 10 MG tablet Take 1 tablet (10 mg total) by mouth 2 (two) times daily as needed for muscle spasms. 20 tablet 0  . glipiZIDE (GLUCOTROL) 10 MG tablet Take 1 tablet (10 mg total) by mouth 2 (two) times daily before a meal. 60 tablet 1  . glucose blood (ACCU-CHEK AVIVA) test strip Use as instructed 100 each prn  . ibuprofen (ADVIL,MOTRIN) 800 MG tablet Take 1 tablet (800 mg total) by mouth 3 (three) times daily. 21 tablet 0  . Insulin Glargine (LANTUS SOLOSTAR) 100 UNIT/ML Solostar Pen Inject 30 Units into the skin daily at 10 pm. 3 pen 2  . Insulin Pen Needle 32G X 4 MM MISC 1 Container by Does not apply route as needed. 1 each prn  . Lancets (ACCU-CHEK SOFT TOUCH) lancets  Use as instructed 100 each prn  . levothyroxine (SYNTHROID, LEVOTHROID) 125 MCG tablet TAKE 1 TABLET BY MOUTH EVERY DAY 90 tablet 0  . metFORMIN (GLUCOPHAGE) 500 MG tablet 1 tab po every other day x 3 days, then 1 tab po daily x 3 days, then bid 60 tablet 1  . methocarbamol (ROBAXIN) 500 MG tablet     . oxyCODONE (OXY IR/ROXICODONE) 5 MG immediate release tablet     . Probiotic Product (PROBIOTIC DAILY) CAPS Take 1 capsule by mouth daily. Reported on 04/28/2016    . saxagliptin HCl (ONGLYZA) 5 MG TABS tablet Take 1 tablet (5 mg total) by mouth daily. 30 tablet 1  . sertraline (ZOLOFT) 50 MG tablet Take 1 tablet (50 mg total) by mouth daily. 30 tablet 2  .  valsartan-hydrochlorothiazide (DIOVAN-HCT) 160-25 MG tablet TAKE 1 TABLET BY MOUTH DAILY 90 tablet 1  . zolpidem (AMBIEN) 5 MG tablet Take 1 tablet (5 mg total) by mouth at bedtime as needed for sleep. 15 tablet 1   No current facility-administered medications for this visit.      Objective:   Physical Exam BP 112/84   Pulse 76   Temp 98.4 F (36.9 C) (Oral)   Ht 5\' 8"  (1.727 m)   Wt 223 lb 9.6 oz (101.4 kg)   SpO2 100%   BMI 34.00 kg/m  Gen:  Alert, cooperative patient who appears stated age in no acute distress.  Vital signs reviewed. HEENT: EOMI,  MMM Cardiac:  Regular rate and rhythm  Pulm:  Clear to auscultation bilaterally  Abd:  Soft/nondistended/nontender.   Back:  Mild TTP BL thoracic region.  Spasm noted.  No midline tenderness.  No rash.  None in lumbar area.   Psych:  Not depressed or anxious appearing.  Linear and coherent thought process as evidenced by speech pattern. Smiles spontaneously.    Imp/Plan: 1.  DM2: - continue invokana. - start Glipizide.  Her A1C was >13 -- despite reported CBG of 120, I'm concerned CBGs much higher.   - wants to put off injectables for as long as possible.  Aversion led to decreased adherence in past.    2.  Mood disorder: - still feels stressed - has FU already scheduled with [facility] next week.  3.  LBP: - Mild.  No red flags by exam or PE - Appears to be muscle strain. Flexeril as relaxer.  - Tramadol for pain relief.  She is working an exorbitant amount plus caring for [name] with cancer and young child.   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   - UA ordered before I saw her.  No evidence of UTI by history or u/a

## 2018-02-09 NOTE — Patient Instructions (Signed)
It was good to see you again today.  I think your back pain is from muscle spasms and overuse rather than coming from your kidneys.   Take the Flexeril as needed for pain relief.  You can also take the tramadol every 6-8 hours also as needed for pain relief.  Take the glipizide 1 pill a day for 3 days and then start taking it twice daily after that.  Keep taking the Invokana as you have been.  Get signed up for Mychart and contact me around the time you see Neoma Laming to let me know what your blood sugars are doing to see if we need to make any changes.

## 2018-02-13 ENCOUNTER — Encounter: Payer: Self-pay | Admitting: Adult Health

## 2018-02-15 ENCOUNTER — Encounter: Payer: Self-pay | Admitting: Family Medicine

## 2018-02-15 ENCOUNTER — Ambulatory Visit: Payer: Self-pay

## 2018-04-12 ENCOUNTER — Inpatient Hospital Stay: Payer: 59 | Attending: Adult Health | Admitting: Adult Health

## 2018-04-12 ENCOUNTER — Other Ambulatory Visit: Payer: Self-pay | Admitting: Adult Health

## 2018-04-12 ENCOUNTER — Encounter: Payer: Self-pay | Admitting: Adult Health

## 2018-04-12 ENCOUNTER — Telehealth: Payer: Self-pay | Admitting: Adult Health

## 2018-04-12 ENCOUNTER — Inpatient Hospital Stay: Payer: 59

## 2018-04-12 VITALS — BP 163/103 | HR 76 | Temp 98.9°F | Ht 68.0 in | Wt 224.2 lb

## 2018-04-12 DIAGNOSIS — Z87891 Personal history of nicotine dependence: Secondary | ICD-10-CM | POA: Diagnosis not present

## 2018-04-12 DIAGNOSIS — Z8571 Personal history of Hodgkin lymphoma: Secondary | ICD-10-CM

## 2018-04-12 DIAGNOSIS — E1165 Type 2 diabetes mellitus with hyperglycemia: Secondary | ICD-10-CM | POA: Diagnosis not present

## 2018-04-12 DIAGNOSIS — Z9114 Patient's other noncompliance with medication regimen: Secondary | ICD-10-CM

## 2018-04-12 DIAGNOSIS — I10 Essential (primary) hypertension: Secondary | ICD-10-CM

## 2018-04-12 LAB — TSH: TSH: 8.03 u[IU]/mL — ABNORMAL HIGH (ref 0.308–3.960)

## 2018-04-12 LAB — CMP (CANCER CENTER ONLY)
ALK PHOS: 67 U/L (ref 40–150)
ALT: 16 U/L (ref 0–55)
AST: 12 U/L (ref 5–34)
Albumin: 3.9 g/dL (ref 3.5–5.0)
Anion gap: 9 (ref 3–11)
BILIRUBIN TOTAL: 0.5 mg/dL (ref 0.2–1.2)
BUN: 9 mg/dL (ref 7–26)
CALCIUM: 9.3 mg/dL (ref 8.4–10.4)
CO2: 23 mmol/L (ref 22–29)
CREATININE: 0.97 mg/dL (ref 0.60–1.10)
Chloride: 103 mmol/L (ref 98–109)
Glucose, Bld: 379 mg/dL — ABNORMAL HIGH (ref 70–140)
Potassium: 4.2 mmol/L (ref 3.5–5.1)
Sodium: 135 mmol/L — ABNORMAL LOW (ref 136–145)
TOTAL PROTEIN: 7.7 g/dL (ref 6.4–8.3)

## 2018-04-12 LAB — CBC WITH DIFFERENTIAL (CANCER CENTER ONLY)
BASOS ABS: 0 10*3/uL (ref 0.0–0.1)
BASOS PCT: 1 %
EOS ABS: 0.1 10*3/uL (ref 0.0–0.5)
EOS PCT: 4 %
HCT: 40.4 % (ref 34.8–46.6)
HEMOGLOBIN: 13.9 g/dL (ref 11.6–15.9)
LYMPHS ABS: 1.1 10*3/uL (ref 0.9–3.3)
Lymphocytes Relative: 33 %
MCH: 31.7 pg (ref 25.1–34.0)
MCHC: 34.5 g/dL (ref 31.5–36.0)
MCV: 91.8 fL (ref 79.5–101.0)
Monocytes Absolute: 0.3 10*3/uL (ref 0.1–0.9)
Monocytes Relative: 10 %
NEUTROS PCT: 52 %
Neutro Abs: 1.8 10*3/uL (ref 1.5–6.5)
PLATELETS: 198 10*3/uL (ref 145–400)
RBC: 4.4 MIL/uL (ref 3.70–5.45)
RDW: 12.4 % (ref 11.2–14.5)
WBC: 3.4 10*3/uL — AB (ref 3.9–10.3)

## 2018-04-12 LAB — LACTATE DEHYDROGENASE: LDH: 185 U/L (ref 125–245)

## 2018-04-12 NOTE — Telephone Encounter (Signed)
Gave patient AVs and calendar of upcoming may 2020 appointments °

## 2018-04-12 NOTE — Progress Notes (Signed)
CLINIC:  Survivorship   REASON FOR VISIT:  Routine follow-up for history of stage II Hodgkins Lymphoma.   BRIEF ONCOLOGIC HISTORY:  Oncology History   Hodgkin's lymphoma   Primary site: Lymphoid Neoplasms (Left)   Staging method: AJCC 6th Edition   Clinical: Stage II   Summary: Stage II       History of Hodgkin's lymphoma   01/11/2012 Procedure    [id number] Left axillary LN biopsy showed Hodgkin lymphoma      01/24/2012 Imaging    Ct scan of chest, abdomen and pelvis & PET scan showed bulky left axillary LN, supraclavicular lymphadenoapthy and medistinal involvement      02/03/2012 - 05/11/2012 Chemotherapy    She completed 4 cycles of ABVD      05/28/2012 Imaging    PET scan showed partial response      06/18/2012 - 07/16/2012 Radiation Therapy    She completed radiation treatment      07/27/2012 Imaging    CT scan of chest showed pneumonia      03/28/2013 Imaging    Ct chest showed no recurrence      04/15/2013 Imaging    Ct angiogram showed pneumonia      06/04/2013 Imaging    PET scan showed complete response      08/06/2013 Imaging    Ct scan showed no recurrence      02/04/2014 Imaging    Ct chest showed no recurrence      07/31/2014 Imaging    Ct chest showed no recurrence       INTERVAL HISTORY:  Ms. Shepler presents to the [facility] today for routine follow-up for her history of lymphoma.  Shabria is very stressed today.  She notes that she is moving and it has been stressful in addition to take care of her mother and her daughter who is 69 years old.  She notes that she has stopped and changed a lot of her medications.  She has a h/o uncontrolled diabetes and non adherence to medication management of such.    Sharyl denies any issues today such as fevers, chills, fatigue, night sweats, lymphadenopathy.      REVIEW OF SYSTEMS:   Review of Systems  Constitutional: Negative for appetite change, chills, fatigue, fever and unexpected  weight change.  HENT:   Negative for hearing loss and lump/mass.   Eyes: Positive for eye problems (blurred vision with elevated blood sugars). Negative for icterus.  Respiratory: Negative for chest tightness, cough and shortness of breath.   Cardiovascular: Negative for chest pain, leg swelling and palpitations.  Gastrointestinal: Negative for abdominal distention, abdominal pain, constipation, diarrhea, nausea and vomiting.  Endocrine: Negative for hot flashes.  Musculoskeletal: Negative for arthralgias.  Skin: Negative for itching and rash.  Neurological: Negative for dizziness, extremity weakness, headaches and numbness.  Hematological: Negative for adenopathy. Does not bruise/bleed easily.  Psychiatric/Behavioral: Negative for depression. The patient is not nervous/anxious.     PAST MEDICAL/SURGICAL HISTORY:  Past Medical History:  Diagnosis Date  . Asthma    seasonal, worse in spring. Uses inhaler during this time of year  . Benign essential HTN 01/19/2012  . Complication of anesthesia SLOW TO WAKE UP AFTER C SECTION  . Cough, persistent 07/27/2012  . DM II (diabetes mellitus, type II), controlled ([place]) 01/19/2012  . Eczema 01/19/2012  . History of Hodgkin's lymphoma 07/27/2012   Stage IIB dx 2/13 Rx ABVD X 4 then involved field Radiation   . Hodgkin lymphoma ([place])  01/18/2012   left axilla & left neck dx'd 01/16/2012  . Hypertension   . Hypothyroidism    06/2014  . Leukopenia 08/01/2012  . Migraine   . Muscle spasm of back 03/09/2012  . S/P chemotherapy, time since 4-12 weeks 05/11/2012   4 cycles of ABVD with neulasta support and zoladex   Past Surgical History:  Procedure Laterality Date  . axillary lymph node biopsy  12/2011   left  . CESAREAN SECTION  2008  . DILATION AND CURETTAGE OF UTERUS     Early 2000s  . KNEE SURGERY Right 11/02/2016   Torn Maniscus  . PORTACATH PLACEMENT  01/25/2012   Procedure: INSERTION PORT-A-CATH;  Surgeon: Gayland Curry, MD,FACS;  Location: [facility]  ORS;  Service: General;  Laterality: Right;  right subclavian     ALLERGIES:  Allergies  Allergen Reactions  . Imitrex [Sumatriptan] Nausea Only  . Metformin And Related Diarrhea  . Penicillins Rash     CURRENT MEDICATIONS:  Outpatient Encounter Medications as of 04/12/2018  Medication Sig Note  . canagliflozin (INVOKANA) 100 MG TABS tablet Take 1 tablet (100 mg total) by mouth daily before breakfast.   . cyclobenzaprine (FLEXERIL) 10 MG tablet Take 1 tablet (10 mg total) by mouth 2 (two) times daily as needed for muscle spasms.   Marland Kitchen glipiZIDE (GLUCOTROL) 10 MG tablet Take 1 tab daily x 3 days, then 1 tab in the AM and 1 tab in PM daily   . glucose blood (ACCU-CHEK AVIVA) test strip Use as instructed   . ibuprofen (ADVIL,MOTRIN) 800 MG tablet Take 1 tablet (800 mg total) by mouth 3 (three) times daily.   . Insulin Pen Needle 32G X 4 MM MISC 1 Container by Does not apply route as needed.   . Lancets (ACCU-CHEK SOFT TOUCH) lancets Use as instructed   . methocarbamol (ROBAXIN) 500 MG tablet as needed.    Marland Kitchen oxyCODONE (OXY IR/ROXICODONE) 5 MG immediate release tablet    . Probiotic Product (PROBIOTIC DAILY) CAPS Take 1 capsule by mouth daily. Reported on 04/28/2016   . traMADol (ULTRAM) 50 MG tablet Take 1 tablet (50 mg total) by mouth every 8 (eight) hours as needed.   . zolpidem (AMBIEN) 5 MG tablet Take 1 tablet (5 mg total) by mouth at bedtime as needed for sleep.   Marland Kitchen sertraline (ZOLOFT) 50 MG tablet Take 1 tablet (50 mg total) by mouth daily. 02/22/2017: Takes as needed  . [DISCONTINUED] Insulin Glargine (LANTUS SOLOSTAR) 100 UNIT/ML Solostar Pen Inject 30 Units into the skin daily at 10 pm. (Patient not taking: Reported on 04/12/2018)   . [DISCONTINUED] levothyroxine (SYNTHROID, LEVOTHROID) 125 MCG tablet TAKE 1 TABLET BY MOUTH EVERY DAY (Patient not taking: Reported on 04/12/2018)   . [DISCONTINUED] metFORMIN (GLUCOPHAGE) 500 MG tablet 1 tab po every other day x 3 days, then 1 tab po daily  x 3 days, then bid (Patient not taking: Reported on 04/12/2018)   . [DISCONTINUED] saxagliptin HCl (ONGLYZA) 5 MG TABS tablet Take 1 tablet (5 mg total) by mouth daily. (Patient not taking: Reported on 04/12/2018)   . [DISCONTINUED] valsartan-hydrochlorothiazide (DIOVAN-HCT) 160-25 MG tablet TAKE 1 TABLET BY MOUTH DAILY (Patient not taking: Reported on 04/12/2018)    No facility-administered encounter medications on file as of 04/12/2018.      ONCOLOGIC FAMILY HISTORY:  Family History  Problem Relation Age of Onset  . Allergies Mother   . Ovarian cancer Mother 82       Also diagnosed with some  blood cancer  . Diabetes Maternal Grandmother   . Heart disease Maternal Grandfather       SOCIAL HISTORY:  Social History   Socioeconomic History  . Marital status: Single    Spouse name: Not on file  . Number of children: Not on file  . Years of education: Not on file  . Highest education level: Not on file  Occupational History  . Not on file  Social Needs  . Financial resource strain: Not on file  . Food insecurity:    Worry: Not on file    Inability: Not on file  . Transportation needs:    Medical: Not on file    Non-medical: Not on file  Tobacco Use  . Smoking status: Former Smoker    Packs/day: 0.25    Years: 5.00    Pack years: 1.25    Types: Cigarettes    Last attempt to quit: 11/28/2002    Years since quitting: 15.3  . Smokeless tobacco: Never Used  . Tobacco comment: Quit ?2004-2005  Substance and Sexual Activity  . Alcohol use: No    Alcohol/week: 0.0 oz  . Drug use: No  . Sexual activity: Yes    Partners: Male    Birth control/protection: IUD  Lifestyle  . Physical activity:    Days per week: Not on file    Minutes per session: Not on file  . Stress: Not on file  Relationships  . Social connections:    Talks on phone: Not on file    Gets together: Not on file    Attends religious service: Not on file    Active member of club or organization: Not on file      Attends meetings of clubs or organizations: Not on file    Relationship status: Not on file  . Intimate partner violence:    Fear of current or ex partner: Not on file    Emotionally abused: Not on file    Physically abused: Not on file    Forced sexual activity: Not on file  Other Topics Concern  . Not on file  Social History Narrative   Works 2 jobs   Married in September 2016, has 1 daughter   Caregiver of her mother        PHYSICAL EXAMINATION:  Vital Signs: Vitals:   04/12/18 1030  BP: (!) 163/103  Pulse: 76  Temp: 98.9 F (37.2 C)  SpO2: 100%   Filed Weights   04/12/18 1030  Weight: 224 lb 3.2 oz (101.7 kg)   General: Well-nourished, well-appearing woman in no acute distress. Unaccompanied today. HEENT: Head is normocephalic.  Pupils equal and reactive to light. Conjunctivae clear without exudate.  Sclerae anicteric. Oral mucosa is pink, moist.  Oropharynx is pink without lesions or erythema.  Lymph: No cervical, supraclavicular, infraclavicular, or axillary lymphadenopathy noted on palpation.  Cardiovascular: Regular rate and rhythm.Marland Kitchen Respiratory: Clear to auscultation bilaterally. Chest expansion symmetric; breathing non-labored.  Breasts: bilateral breasts without nodules, masses, skin or nipple changes. GI: Abdomen soft and round; non-tender, non-distended. Bowel sounds normoactive. No hepatosplenomegaly.   GU: Deferred.  Neuro: No focal deficits. Steady gait.  Psych: Mood and affect normal and appropriate for situation.  Extremities: No edema. Skin: Warm and dry.   LABORATORY DATA:  Appointment on 04/12/2018  Component Date Value Ref Range Status  . TSH 04/12/2018 8.030* 0.308 - 3.960 uIU/mL Final   Performed at [facility], [place] [address], [place], [place] [address]  .  LDH 04/12/2018 185  125 - 245 U/L Final   Performed at [facility], [place] [address], [place], [place] [address]  . Sodium  04/12/2018 135* 136 - 145 mmol/L Final  . Potassium 04/12/2018 4.2  3.5 - 5.1 mmol/L Final  . Chloride 04/12/2018 103  98 - 109 mmol/L Final  . CO2 04/12/2018 23  22 - 29 mmol/L Final  . Glucose, Bld 04/12/2018 379* 70 - 140 mg/dL Final  . BUN 04/12/2018 9  7 - 26 mg/dL Final  . Creatinine 04/12/2018 0.97  0.60 - 1.10 mg/dL Final  . Calcium 04/12/2018 9.3  8.4 - 10.4 mg/dL Final  . Total Protein 04/12/2018 7.7  6.4 - 8.3 g/dL Final  . Albumin 04/12/2018 3.9  3.5 - 5.0 g/dL Final  . AST 04/12/2018 12  5 - 34 U/L Final  . ALT 04/12/2018 16  0 - 55 U/L Final  . Alkaline Phosphatase 04/12/2018 67  40 - 150 U/L Final  . Total Bilirubin 04/12/2018 0.5  0.2 - 1.2 mg/dL Final  . GFR, Est Non Af Am 04/12/2018 >60  >60 mL/min Final  . GFR, Est AFR Am 04/12/2018 >60  >60 mL/min Final   Comment: (NOTE) The eGFR has been calculated using the CKD EPI equation. This calculation has not been validated in all clinical situations. eGFR's persistently <60 mL/min signify possible Chronic Kidney Disease.   Georgiann Hahn gap 04/12/2018 9  3 - 11 Final   Performed at [facility], [place] [address], [place], [place] [address]  . WBC Count 04/12/2018 3.4* 3.9 - 10.3 K/uL Final  . RBC 04/12/2018 4.40  3.70 - 5.45 MIL/uL Final  . Hemoglobin 04/12/2018 13.9  11.6 - 15.9 g/dL Final  . HCT 04/12/2018 40.4  34.8 - 46.6 % Final  . MCV 04/12/2018 91.8  79.5 - 101.0 fL Final  . MCH 04/12/2018 31.7  25.1 - 34.0 pg Final  . MCHC 04/12/2018 34.5  31.5 - 36.0 g/dL Final  . RDW 04/12/2018 12.4  11.2 - 14.5 % Final  . Platelet Count 04/12/2018 198  145 - 400 K/uL Final  . Neutrophils Relative % 04/12/2018 52  % Final  . Neutro Abs 04/12/2018 1.8  1.5 - 6.5 K/uL Final  . Lymphocytes Relative 04/12/2018 33  % Final  . Lymphs Abs 04/12/2018 1.1  0.9 - 3.3 K/uL Final  . Monocytes Relative 04/12/2018 10  % Final  . Monocytes Absolute 04/12/2018 0.3  0.1 - 0.9 K/uL Final  . Eosinophils Relative 04/12/2018  4  % Final  . Eosinophils Absolute 04/12/2018 0.1  0.0 - 0.5 K/uL Final  . Basophils Relative 04/12/2018 1  % Final  . Basophils Absolute 04/12/2018 0.0  0.0 - 0.1 K/uL Final   Performed at [facility], [place] [address] [place], [place] [address]    DIAGNOSTIC IMAGING:  None for this visit  Hodgkin Lymphoma Recommendations:      ASSESSMENT AND PLAN:  Ms.. Morua is a pleasant 37 y.o. woman with h/o Stage II Hodgkin Lymphoma, diagnosed in 2013; treated with chemotherapy and radiation. Ms.Hyser presents to the Survivorship Clinic for surveillance and routine follow-up.   1. History of Hodgkin Lymphoma:  Ms. Mctier is currently clinically and radiographically without evidence of disease or recurrence of lymphoma. She will follow-up in the Survivorship Clinic in 1 year with labs, history, and physical exam per surveillance protocol.  I encouraged her to call me with any questions or  concerns before her next visit at the cancer center, and I would be happy to see the patient sooner, if needed.    2. Uncontrolled diabetes: I counseled patient on the importance of adherence to her medication management of diabetes, and recommendations by her PCP. I cautioned her regarding the complication of organ damage related to long term uncontrolled diabetes, and that if her cancer were ever to recur, her uncontrolled diabetes would make treatment difficult.    3. Cancer screening:  Due to Ms. Facchini's history and age, she should receive screening for skin cancers, gynecologic cancer.  Breast cancer screening to start at 2021 since she did undergo chest radiation. The patient was encouraged to follow-up with her PCP for appropriate cancer screenings.   4. Health maintenance and wellness promotion: Ms. Red was encouraged to consume 5-7 servings of fruits and vegetables per day. The patient was also encouraged to engage in moderate to vigorous exercise for 30 minutes per  day most days of the week. Ms. Bluett was instructed to limit her alcohol consumption and continue to abstain from tobacco use.  Other recommendations for Hodgkins survivors are noted above.      Dispo:  -Return to cancer center to see Survivorship NP in one year with labs prior -Breast cancer screening to start in 2021 at 8 years post therapy -F/u with PCP about medical issues regularly   A total of (30) minutes of face-to-face time was spent with this patient with greater than 50% of that time in counseling and care-coordination.   Wilber Bihari, NP Survivorship Program [facility] [phone]   Note: PRIMARY CARE PROVIDER Alveda Reasons, [place] [phone]

## 2018-08-09 ENCOUNTER — Other Ambulatory Visit: Payer: Self-pay

## 2018-08-09 ENCOUNTER — Encounter (HOSPITAL_COMMUNITY): Payer: Self-pay | Admitting: *Deleted

## 2018-08-09 DIAGNOSIS — E119 Type 2 diabetes mellitus without complications: Secondary | ICD-10-CM | POA: Insufficient documentation

## 2018-08-09 DIAGNOSIS — Z87891 Personal history of nicotine dependence: Secondary | ICD-10-CM | POA: Diagnosis not present

## 2018-08-09 DIAGNOSIS — Z79899 Other long term (current) drug therapy: Secondary | ICD-10-CM | POA: Insufficient documentation

## 2018-08-09 DIAGNOSIS — Z7984 Long term (current) use of oral hypoglycemic drugs: Secondary | ICD-10-CM | POA: Diagnosis not present

## 2018-08-09 DIAGNOSIS — J45909 Unspecified asthma, uncomplicated: Secondary | ICD-10-CM | POA: Diagnosis not present

## 2018-08-09 DIAGNOSIS — I10 Essential (primary) hypertension: Secondary | ICD-10-CM | POA: Insufficient documentation

## 2018-08-09 DIAGNOSIS — L0231 Cutaneous abscess of buttock: Secondary | ICD-10-CM | POA: Diagnosis present

## 2018-08-09 DIAGNOSIS — E039 Hypothyroidism, unspecified: Secondary | ICD-10-CM | POA: Diagnosis not present

## 2018-08-09 DIAGNOSIS — Z8571 Personal history of Hodgkin lymphoma: Secondary | ICD-10-CM | POA: Diagnosis not present

## 2018-08-09 NOTE — ED Triage Notes (Signed)
Pt reports right buttocks abscess for about 1 week, has been draining today. Denies fevers or vomiting. She reports she does have HTN, did not take her meds today

## 2018-08-10 ENCOUNTER — Emergency Department (HOSPITAL_COMMUNITY)
Admission: EM | Admit: 2018-08-10 | Discharge: 2018-08-10 | Disposition: A | Discharge status: Home / Self Care | Payer: 59 | Attending: Emergency Medicine | Admitting: Emergency Medicine

## 2018-08-10 DIAGNOSIS — L0291 Cutaneous abscess, unspecified: Secondary | ICD-10-CM

## 2018-08-10 MED ORDER — DOXYCYCLINE HYCLATE 100 MG PO CAPS: 100.0000 mg | ORAL_CAPSULE | Freq: Two times a day (BID) | ORAL | 0 refills | Status: DC

## 2018-08-10 MED ORDER — LIDOCAINE-EPINEPHRINE (PF) 2 %-1:200000 IJ SOLN
10.0000 mL | Freq: Once | INTRAMUSCULAR | Status: AC
  Administered 2018-08-10: 10 mL

## 2018-08-10 MED ORDER — LIDOCAINE-EPINEPHRINE (PF) 2 %-1:200000 IJ SOLN
INTRAMUSCULAR | Status: AC
  Filled 2018-08-10: qty 20

## 2018-08-10 NOTE — ED Provider Notes (Signed)
[facility] DEPT Provider Note   CSN: [id number] Arrival date & time: 08/09/18  2325     History   Chief Complaint Chief Complaint  Patient presents with  . Abscess    HPI [name] is a 37 y.o. female.  Patient with past medical history of diabetes presents to the emergency department with a chief complaint of abscess.  She states that she noticed a boil on her buttock about a week ago.  He has been gradually worsening.  It has been draining purulent discharge.  She denies fever.  Denies any other associated symptoms.  She has not taken anything for her symptoms.  The history is provided by the patient. No language interpreter was used.    Past Medical History:  Diagnosis Date  . Asthma    seasonal, worse in spring. Uses inhaler during this time of year  . Benign essential HTN 01/19/2012  . Complication of anesthesia SLOW TO WAKE UP AFTER C SECTION  . Cough, persistent 07/27/2012  . DM II (diabetes mellitus, type II), controlled ([place]) 01/19/2012  . Eczema 01/19/2012  . History of Hodgkin's lymphoma 07/27/2012   Stage IIB dx 2/13 Rx ABVD X 4 then involved field Radiation   . Hodgkin lymphoma ([place]) 01/18/2012   left axilla & left neck dx'd 01/16/2012  . Hypertension   . Hypothyroidism    06/2014  . Leukopenia 08/01/2012  . Migraine   . Muscle spasm of back 03/09/2012  . S/P chemotherapy, time since 4-12 weeks 05/11/2012   4 cycles of ABVD with neulasta support and zoladex    Patient Active Problem List   Diagnosis Date Noted  . Reactive depression 12/29/2016  . IUD (intrauterine device) in place 12/27/2016  . Lateral knee pain, right 09/06/2016  . Abdominal pain 04/22/2016  . Candidiasis of vagina 04/21/2016  . Quality of life palliative care encounter 04/08/2016  . Diabetes type 2, uncontrolled ([place]) 07/17/2015  . Boil of buttock 01/28/2015  . Vaginal discharge 01/28/2015  . Chest pain 09/19/2014  . SOB (shortness of breath)  09/05/2014  . Preventive measure 08/07/2014  . Other malaise and fatigue 07/09/2014  . Obesity, Class II, BMI 35-39.9, with comorbidity ([place]) 07/09/2014  . Proteinuria 01/10/2014  . Other specified hypothyroidism 01/10/2014  . Cough 12/04/2013  . Murmur 10/17/2013  . Sleep disorder 10/11/2013  . Headache(784.0) 09/16/2013  . Anxiety state, unspecified 09/16/2013  . Contraception 01/24/2013  . Leukopenia 08/01/2012  . History of Hodgkin's lymphoma 07/27/2012  . Pulmonary infiltrates 07/27/2012  . Hypertension associated with diabetes ([place]) 01/19/2012    Past Surgical History:  Procedure Laterality Date  . axillary lymph node biopsy  12/2011   left  . CESAREAN SECTION  2008  . DILATION AND CURETTAGE OF UTERUS     Early 2000s  . KNEE SURGERY Right 11/02/2016   Torn Maniscus  . PORTACATH PLACEMENT  01/25/2012   Procedure: INSERTION PORT-A-CATH;  Surgeon: Gayland Curry, MD,FACS;  Location: [facility]S;  Service: General;  Laterality: Right;  right subclavian     OB History    Gravida  3   Para  1   Term  1   Preterm      AB  2   Living  1     SAB  2   TAB  0   Ectopic  0   Multiple  0   Live Births  Home Medications    Prior to Admission medications   Medication Sig Start Date End Date Taking? Authorizing Provider  canagliflozin (INVOKANA) 100 MG TABS tablet Take 1 tablet (100 mg total) by mouth daily before breakfast. 01/18/18   Alveda Reasons, MD  cyclobenzaprine (FLEXERIL) 10 MG tablet Take 1 tablet (10 mg total) by mouth 2 (two) times daily as needed for muscle spasms. 02/09/18   Alveda Reasons, MD  glipiZIDE (GLUCOTROL) 10 MG tablet Take 1 tab daily x 3 days, then 1 tab in the AM and 1 tab in PM daily 02/09/18   Alveda Reasons, MD  glucose blood (ACCU-CHEK AVIVA) test strip Use as instructed 07/27/15   Zenia Resides, MD  ibuprofen (ADVIL,MOTRIN) 800 MG tablet Take 1 tablet (800 mg total) by mouth 3 (three) times daily. 08/27/16    Domenic Moras, PA-C  Insulin Pen Needle 32G X 4 MM MISC 1 Container by Does not apply route as needed. 01/12/16   Alveda Reasons, MD  Lancets (ACCU-CHEK SOFT [facility]) lancets Use as instructed 09/10/15   Zenia Resides, MD  methocarbamol (ROBAXIN) 500 MG tablet as needed.  11/16/16   [provider]  oxyCODONE (OXY IR/ROXICODONE) 5 MG immediate release tablet  11/16/16   [provider]  Probiotic Product (PROBIOTIC DAILY) CAPS Take 1 capsule by mouth daily. Reported on 04/28/2016    [provider]  sertraline (ZOLOFT) 50 MG tablet Take 1 tablet (50 mg total) by mouth daily. 12/27/16 12/27/17  Molt, Bethany, DO  traMADol (ULTRAM) 50 MG tablet Take 1 tablet (50 mg total) by mouth every 8 (eight) hours as needed. 02/09/18   Alveda Reasons, MD  zolpidem (AMBIEN) 5 MG tablet Take 1 tablet (5 mg total) by mouth at bedtime as needed for sleep. 07/17/15   Alveda Reasons, MD    Family History Family History  Problem Relation Age of Onset  . Allergies Mother   . Ovarian cancer Mother 9       Also diagnosed with some blood cancer  . Diabetes Maternal Grandmother   . Heart disease Maternal Grandfather     Social History Social History   Tobacco Use  . Smoking status: Former Smoker    Packs/day: 0.25    Years: 5.00    Pack years: 1.25    Types: Cigarettes    Last attempt to quit: 11/28/2002    Years since quitting: 15.7  . Smokeless tobacco: Never Used  . Tobacco comment: Quit ?2004-2005  Substance Use Topics  . Alcohol use: No    Alcohol/week: 0.0 standard drinks  . Drug use: No     Allergies   Imitrex [sumatriptan]; Metformin and related; and Penicillins   Review of Systems Review of Systems  All other systems reviewed and are negative.    Physical Exam Updated Vital Signs BP (!) 171/119   Pulse 96   Temp 98.7 F (37.1 C) (Oral)   Resp 16   SpO2 99%   Physical Exam  Constitutional: She is oriented to person, place, and time. No distress.    HENT:  Head: Normocephalic and atraumatic.  Eyes: Pupils are equal, round, and reactive to light. Conjunctivae and EOM are normal.  Neck: No tracheal deviation present.  Cardiovascular: Normal rate.  Pulmonary/Chest: Effort normal. No respiratory distress.  Abdominal: Soft.  Genitourinary:  Genitourinary Comments: Chaperone present for exam, 2 x 2 centimeter abscess to right gluteal cleft  Musculoskeletal: Normal range of motion.  Neurological: She  is alert and oriented to person, place, and time.  Skin: Skin is warm and dry. She is not diaphoretic.  Psychiatric: Judgment normal.  Nursing note and vitals reviewed.    ED Treatments / Results  Labs (all labs ordered are listed, but only abnormal results are displayed) Labs Reviewed - No data to display  EKG None  Radiology No results found.  Procedures Procedures (including critical care time) INCISION AND DRAINAGE Performed by: Montine Circle Consent: Verbal consent obtained. Risks and benefits: risks, benefits and alternatives were discussed Type: abscess  Body area: Right buttock  Anesthesia: local infiltration  Incision was made with a scalpel.  Local anesthetic: lidocaine 2% with epinephrine  Anesthetic total: 3 ml  Complexity: complex Blunt dissection to break up loculations  Drainage: purulent  Drainage amount: mild  Packing material: 1/2 in iodoform gauze  Patient tolerance: Patient tolerated the procedure well with no immediate complications.    Medications Ordered in ED Medications  lidocaine-EPINEPHrine (XYLOCAINE W/EPI) 2 %-1:200000 (PF) injection 10 mL (has no administration in time range)  lidocaine-EPINEPHrine (XYLOCAINE W/EPI) 2 %-1:200000 (PF) injection (has no administration in time range)     Initial Impression / Assessment and Plan / ED Course  I have reviewed the triage vital signs and the nursing notes.  Pertinent labs & imaging results that were available during my care of  the patient were reviewed by me and considered in my medical decision making (see chart for details).     Patient with small abscess.  I&D in ED.  DC home with abx.  Return for worsening symptoms.   Final Clinical Impressions(s) / ED Diagnoses   Final diagnoses:  Abscess    ED Discharge Orders         Ordered    doxycycline (VIBRAMYCIN) 100 MG capsule  2 times daily,   Status:  Discontinued     08/10/18 0518    doxycycline (VIBRAMYCIN) 100 MG capsule  2 times daily     08/10/18 0519           Montine Circle, PA-C 08/10/18 0519    Molpus, Jenny Reichmann, MD 08/10/18 [phone]

## 2018-08-10 NOTE — Discharge Instructions (Signed)
You may pull out the packing in 2 days.  It's ok if it falls out earlier.  Take antibiotics as directed.  Return for worsening symptoms.

## 2018-09-05 ENCOUNTER — Ambulatory Visit (INDEPENDENT_AMBULATORY_CARE_PROVIDER_SITE_OTHER): Payer: 59 | Admitting: Family Medicine

## 2018-09-05 ENCOUNTER — Other Ambulatory Visit: Payer: Self-pay

## 2018-09-05 ENCOUNTER — Encounter: Payer: Self-pay | Admitting: Family Medicine

## 2018-09-05 VITALS — BP 138/84 | Temp 98.7°F | Wt 222.6 lb

## 2018-09-05 DIAGNOSIS — Z794 Long term (current) use of insulin: Secondary | ICD-10-CM

## 2018-09-05 DIAGNOSIS — E1165 Type 2 diabetes mellitus with hyperglycemia: Secondary | ICD-10-CM

## 2018-09-05 DIAGNOSIS — IMO0001 Reserved for inherently not codable concepts without codable children: Secondary | ICD-10-CM

## 2018-09-05 DIAGNOSIS — L02414 Cutaneous abscess of left upper limb: Secondary | ICD-10-CM | POA: Diagnosis not present

## 2018-09-05 LAB — POCT GLYCOSYLATED HEMOGLOBIN (HGB A1C): HbA1c, POC (controlled diabetic range): 14 % — AB (ref 0.0–7.0)

## 2018-09-05 MED ORDER — DOXYCYCLINE HYCLATE 100 MG PO CAPS: 100.0000 mg | ORAL_CAPSULE | Freq: Two times a day (BID) | ORAL | 0 refills | Status: DC

## 2018-09-05 MED ORDER — DULAGLUTIDE 0.75 MG/0.5ML ~~LOC~~ SOAJ: 0.7500 mg | SUBCUTANEOUS | 6 refills | Status: DC

## 2018-09-05 MED ORDER — ZOLPIDEM TARTRATE 5 MG PO TABS: 5.0000 mg | ORAL_TABLET | Freq: Every evening | ORAL | 1 refills | Status: DC | PRN

## 2018-09-05 MED ORDER — GLIPIZIDE 10 MG PO TABS: ORAL_TABLET | ORAL | 1 refills | Status: DC

## 2018-09-05 NOTE — Progress Notes (Signed)
Subjective:    [name] is a 37 y.o. female who presents to [facility] today for diabetes:  1.  Diabetes:  Currently not taking any of her medicines.  She has had a high number of stressors at home including being kicked out of her house this past June and reports/admits she has not been taking care of herself.  Is not been on her medicine for several months at least.  No adverse effects from medication.  No hypoglycemic events.  No paresthesia or peripheral nerve pain.  No polyuria/polydipsia.  Measures blood sugars at home every not measuring her blood sugars at home.  States that she does her blood sugars are high because "my vision changes and becomes very."     Lab Results  Component Value Date   HGBA1C 14.0 (A) 09/05/2018   2.  Left arm abscess: Present for the past several days.  She had a similar abscess on her right buttock which was drained in the emergency department, see ER notes for this.  She has not tried anything for her left arm.  This appeared after she had finished doxycycline for her right buttock abscess.  She has no fevers or chills.  No pain except around the redness of the abscess on her left arm.   Prev health:  Currently overdue for multiple DM prev maintenance checks.  ROS as above per HPI.  Pertinently, no chest pain, palpitations, SOB, Fever, Chills, Abd pain, N/V/D.   The following portions of the patient's history were reviewed and updated as appropriate: allergies, current medications, past medical history, family and social history, and problem list. Patient is a nonsmoker.    PMH reviewed.  Past Medical History:  Diagnosis Date  . Asthma    seasonal, worse in spring. Uses inhaler during this time of year  . Benign essential HTN 01/19/2012  . Complication of anesthesia SLOW TO WAKE UP AFTER C SECTION  . Cough, persistent 07/27/2012  . DM II (diabetes mellitus, type II), controlled ([place]) 01/19/2012  . Eczema 01/19/2012  . History of Hodgkin's lymphoma  07/27/2012   Stage IIB dx 2/13 Rx ABVD X 4 then involved field Radiation   . Hodgkin lymphoma ([place]) 01/18/2012   left axilla & left neck dx'd 01/16/2012  . Hypertension   . Hypothyroidism    06/2014  . Leukopenia 08/01/2012  . Migraine   . Muscle spasm of back 03/09/2012  . S/P chemotherapy, time since 4-12 weeks 05/11/2012   4 cycles of ABVD with neulasta support and zoladex   Past Surgical History:  Procedure Laterality Date  . axillary lymph node biopsy  12/2011   left  . CESAREAN SECTION  2008  . DILATION AND CURETTAGE OF UTERUS     Early 2000s  . KNEE SURGERY Right 11/02/2016   [place]  . PORTACATH PLACEMENT  01/25/2012   Procedure: INSERTION PORT-A-CATH;  Surgeon: Gayland Curry, MD,FACS;  Location: [facility];  Service: General;  Laterality: Right;  right subclavian    Medications reviewed. Current Outpatient Medications  Medication Sig Dispense Refill  . canagliflozin (INVOKANA) 100 MG TABS tablet Take 1 tablet (100 mg total) by mouth daily before breakfast. 30 tablet 1  . cyclobenzaprine (FLEXERIL) 10 MG tablet Take 1 tablet (10 mg total) by mouth 2 (two) times daily as needed for muscle spasms. 30 tablet 1  . doxycycline (VIBRAMYCIN) 100 MG capsule Take 1 capsule (100 mg total) by mouth 2 (two) times daily. 20 capsule 0  . glipiZIDE (  GLUCOTROL) 10 MG tablet Take 1 tab daily x 3 days, then 1 tab in the AM and 1 tab in PM daily 60 tablet 1  . glucose blood (ACCU-CHEK AVIVA) test strip Use as instructed 100 each prn  . ibuprofen (ADVIL,MOTRIN) 800 MG tablet Take 1 tablet (800 mg total) by mouth 3 (three) times daily. 21 tablet 0  . Insulin Pen Needle 32G X 4 MM MISC 1 Container by Does not apply route as needed. 1 each prn  . Lancets (ACCU-CHEK SOFT TOUCH) lancets Use as instructed 100 each prn  . methocarbamol (ROBAXIN) 500 MG tablet as needed.     Marland Kitchen oxyCODONE (OXY IR/ROXICODONE) 5 MG immediate release tablet     . Probiotic Product (PROBIOTIC DAILY) CAPS Take 1 capsule by mouth  daily. Reported on 04/28/2016    . sertraline (ZOLOFT) 50 MG tablet Take 1 tablet (50 mg total) by mouth daily. 30 tablet 2  . traMADol (ULTRAM) 50 MG tablet Take 1 tablet (50 mg total) by mouth every 8 (eight) hours as needed. 30 tablet 1  . zolpidem (AMBIEN) 5 MG tablet Take 1 tablet (5 mg total) by mouth at bedtime as needed for sleep. 15 tablet 1   No current facility-administered medications for this visit.      Objective:   Physical Exam BP 138/84   Temp 98.7 F (37.1 C) (Oral)   Wt 222 lb 9.6 oz (101 kg)   BMI 33.85 kg/m  Gen:  Alert, cooperative patient who appears stated age in no acute distress.  Vital signs reviewed. HEENT: EOMI,  MMM Cardiac:  Regular rate and rhythm without murmur auscultated.  Good S1/S2. Pulm:  Clear to auscultation bilaterally with good air movement.  No wheezes or rales noted.   Abd:  Soft/nondistended/nontender.  Good bowel sounds throughout all four quadrants.  No masses noted.  Left arm: Area of redness and induration left arm on the dorsal aspect of her forearm.  There is no fluctuance but a.  About 2 cm in diameter induration here.  It has already spontaneously drained.  I cannot express any more purulence.  Results for orders placed or performed in visit on 09/05/18 (from the past 72 hour(s))  HgB A1c     Status: Abnormal   Collection Time: 09/05/18  4:30 PM  Result Value Ref Range   Hemoglobin A1C     HbA1c POC (<> result, manual entry)     HbA1c, POC (prediabetic range)     HbA1c, POC (controlled diabetic range) 14.0 (A) 0.0 - 7.0 %   Pression/plan: 1.  Diabetes: -Patient continues to be nonadherent to treatment regimen. -We have discussed long-term issues with this.  She is able to verbalize what can happen long-term she does not take her medications but states it is much harder to take care of herself than for the people around her. -I have refilled her metformin/glipizide combination as well as we are starting once a week Trulicity.  She  is not able to for this will switch back to the Invokana which was still pretty expensive for her. -Essentially she needs insulin but is adamant she does not want to use an injectable daily.-She is interested in Trulicity especially as it can help with weight loss.  2.  Left arm abscess:  -doxycycline to treat. -Are deep spontaneously draining. -Warning precautions provided to return. -Discussed how this is related to her diabetes

## 2018-09-05 NOTE — Patient Instructions (Addendum)
It was good to see you again today.  I've refilled your Glipizide for you.  Try the Trulicity once a week for your diabetes.  Come back and see me in 3 months.

## 2018-09-08 MED ORDER — DOXYCYCLINE HYCLATE 100 MG PO CAPS: 100.0000 mg | ORAL_CAPSULE | Freq: Two times a day (BID) | ORAL | 0 refills | Status: DC

## 2018-09-09 ENCOUNTER — Encounter: Payer: Self-pay | Admitting: Family Medicine

## 2018-09-11 MED ORDER — CANAGLIFLOZIN 300 MG PO TABS: 300.0000 mg | ORAL_TABLET | Freq: Every day | ORAL | 6 refills | Status: DC

## 2018-09-11 NOTE — Telephone Encounter (Signed)
I have switched the patient back to her Invokana.  Please call and let her know.  Thanks!

## 2018-09-13 NOTE — Telephone Encounter (Signed)
Called and spoke with patient.  I switched back to Invokana.  She was appreciative of this.

## 2018-09-25 ENCOUNTER — Encounter: Payer: Self-pay | Admitting: Family Medicine

## 2018-09-25 DIAGNOSIS — E1169 Type 2 diabetes mellitus with other specified complication: Secondary | ICD-10-CM

## 2018-09-26 NOTE — Telephone Encounter (Signed)
Called and spoke with [name].  She reports the same symptoms as per her my chart message.  Nausea, lightheadedness, dizziness, general feeling bad.  She went from not taking medicine in all to taking the glipizide twice a day in the epicondyle once a day.  Her brother is diabetic and she checked her blood sugar on his meter and reports it is "less than 200."  It sounds like she is relatively hypoglycemic.  Her blood sugars have been around the 4-500 range for the past year at least if not longer.  I think she dropped her blood sugars little too quickly.  Plan is to not taking them tomorrow she is already taking her second dose of glipizide today.  On Friday she will take once a day glipizide for 3 days, then add back to twice daily glipizide for 3 days, then add Invokana to slowly titrate the medications upwards.  We also talked about other things that may be causing her nausea and vomiting and symptoms.  She states that if she is still feeling bad tomorrow despite not taking any glipizide she will come in for lab visit.  We will check her creatinine and other labs at that visit.  If her symptoms return next week after restarting Invokana then we will check labs then.  She also reports that today she had a swollen lymph node.  This is painful and large.  She has history of lymphoma.  Told her that it is reassuring that is painful and that it came up so quickly.  However if it does not resolve or it spreads or she notices of the lymph nodes, and we can check it by the end of the week.  Appreciative call.

## 2018-10-05 ENCOUNTER — Ambulatory Visit (INDEPENDENT_AMBULATORY_CARE_PROVIDER_SITE_OTHER): Payer: 59

## 2018-10-05 ENCOUNTER — Other Ambulatory Visit: Payer: Self-pay | Admitting: Family Medicine

## 2018-10-05 DIAGNOSIS — Z23 Encounter for immunization: Secondary | ICD-10-CM | POA: Diagnosis not present

## 2018-10-05 MED ORDER — LEVOTHYROXINE SODIUM 100 MCG PO TABS: 100.0000 ug | ORAL_TABLET | Freq: Every day | ORAL | 3 refills | Status: DC

## 2018-10-05 NOTE — Progress Notes (Signed)
Patient has been out of and unable to afford synthroid for past several months.  Ready to restart now.  I've sent this in today.  She was here with her daughter for [facility].  Previously she was on 125 mcg, but I'm starting a little lower dose as she's been not on this for some time.  May titrate up as needed once she's used to being on the medicine again.

## 2018-10-08 ENCOUNTER — Ambulatory Visit: Payer: 59

## 2018-12-27 DIAGNOSIS — H25043 Posterior subcapsular polar age-related cataract, bilateral: Secondary | ICD-10-CM | POA: Diagnosis not present

## 2018-12-27 DIAGNOSIS — H5213 Myopia, bilateral: Secondary | ICD-10-CM | POA: Diagnosis not present

## 2018-12-27 DIAGNOSIS — H52203 Unspecified astigmatism, bilateral: Secondary | ICD-10-CM | POA: Diagnosis not present

## 2018-12-27 DIAGNOSIS — H25013 Cortical age-related cataract, bilateral: Secondary | ICD-10-CM | POA: Diagnosis not present

## 2018-12-27 DIAGNOSIS — E119 Type 2 diabetes mellitus without complications: Secondary | ICD-10-CM | POA: Diagnosis not present

## 2019-01-08 ENCOUNTER — Ambulatory Visit (INDEPENDENT_AMBULATORY_CARE_PROVIDER_SITE_OTHER): Payer: Self-pay | Admitting: Family Medicine

## 2019-01-08 ENCOUNTER — Other Ambulatory Visit: Payer: Self-pay

## 2019-01-08 VITALS — BP 145/90 | HR 80 | Temp 98.5°F | Wt 224.0 lb

## 2019-01-08 DIAGNOSIS — B9789 Other viral agents as the cause of diseases classified elsewhere: Secondary | ICD-10-CM

## 2019-01-08 DIAGNOSIS — J069 Acute upper respiratory infection, unspecified: Secondary | ICD-10-CM

## 2019-01-08 MED ORDER — AZITHROMYCIN 250 MG PO TABS: ORAL_TABLET | ORAL | 0 refills | Status: DC

## 2019-01-08 MED ORDER — BENZONATATE 100 MG PO CAPS: 100.0000 mg | ORAL_CAPSULE | Freq: Two times a day (BID) | ORAL | 0 refills | Status: DC | PRN

## 2019-01-08 NOTE — Progress Notes (Signed)
   Subjective:   Patient ID: [name]    DOB: 09/27/1981, 38 y.o. female   MRN: [id number]  CC: cold symptoms   HPI: [name] is a 38 y.o. female who presents to clinic today for the following issue.  Cough/runny nose Coughing x 1 week. Cough productive of yellow sputum and is associated with bad congestion and runny nose.  She also endorses sore throat but no headache.   Fever to 102F on Sunday but has not returned since. 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 No abdominal pain but diarrhea x 2 days last week which has resolved.  She has been eating less due to a decreased appetite but has been able to drink plenty of fluids.  Having body aches, fatigue and pain with cough.  She has been taking Mucinex, Nyquil/Dayquil and cough drops for symptomatic relief. Has also used Emergenc-C vitamin packets and chloroseptic spray for her throat.   ROS: See HPI for pertinent ROS.  Social: pt is a former smoker, quit 2004 Medications reviewed. Objective:   BP (!) 145/90   Pulse 80   Temp 98.5 F (36.9 C) (Oral)   Wt 224 lb (101.6 kg)   BMI 34.06 kg/m  Vitals and nursing note reviewed.  General: 38 year old female, NAD HEENT: NCAT, EOMI, PERRL, MMM, clear rhinorrhea, oropharynx clear without evidence of tonsillar erythema or exudate Neck: supple, no LAD CV: RRR no MRG  Lungs: CTAB, normal effort  Abdomen: soft, NTND, +bs  Skin: warm, dry, no rash, brisk cap refill Extremities: warm and well perfused, no edema Neuro: Alert, no focal deficits  Assessment & Plan:   Cough with congestion  Symptoms c/w viral URI.  Low suspicion for pneumonia given normal lung exam.   However, given severity and duration of her symptoms, will treat with a course of antibiotics.  -Rx: Azithromycin -Rx: tessalon pearles prn for cough Advised warm fluids, may add honey to soothe throat Work note provided for return  tomorrow Return precautions   Meds ordered this encounter  Medications  . azithromycin (ZITHROMAX) 250 MG tablet    Sig: Take 2 tablets on day 1 followed by 1 tablet daily until you have completed the course.    Dispense:  6 tablet    Refill:  0  . benzonatate (TESSALON) 100 MG capsule    Sig: Take 1 capsule (100 mg total) by mouth 2 (two) times daily as needed for cough.    Dispense:  20 capsule    Refill:  0   Lovenia Kim, MD [employer] PGY-3

## 2019-01-08 NOTE — Patient Instructions (Addendum)
It was nice meeting you today.  You were seen in clinic for cough and cold symptoms which are most likely due to a viral upper respiratory tract infection.  Given your duration and severity of symptoms, I am prescribing you a course of antibiotics to help alleviate some of these.  Additionally, I have sent some Tessalon Perles which should help you with your cough.  I would encourage drinking warm fluids even if your appetite is not 100%.  You may add honey to soothe your throat.  If you have any new or worsening symptoms, please make an appointment to be seen by a provider again.

## 2019-03-15 ENCOUNTER — Telehealth: Payer: Self-pay | Admitting: Adult Health

## 2019-03-15 NOTE — Telephone Encounter (Signed)
Rescheduled LTS appt and changed to [facility] per sch msg. Patient will be contacted

## 2019-04-11 ENCOUNTER — Telehealth: Payer: Self-pay | Admitting: Adult Health

## 2019-04-11 NOTE — Telephone Encounter (Signed)
Spoke with patient and she would like to cancel and reschedule appt. for 04/15/19 Scheduling dept please call to reschedule for later date.

## 2019-04-12 ENCOUNTER — Telehealth: Payer: Self-pay | Admitting: Adult Health

## 2019-04-12 NOTE — Telephone Encounter (Signed)
Called patient regarding notes about cancelling an upcoming appointment. 05/18 appointment has been cancelled and left patient a voicemail to call back when ready to reschedule.  Message to provider.

## 2019-04-15 ENCOUNTER — Ambulatory Visit: Payer: 59 | Admitting: Adult Health

## 2019-04-18 ENCOUNTER — Other Ambulatory Visit: Payer: Self-pay

## 2019-04-18 ENCOUNTER — Encounter: Payer: Self-pay | Admitting: Adult Health

## 2019-05-03 ENCOUNTER — Telehealth (INDEPENDENT_AMBULATORY_CARE_PROVIDER_SITE_OTHER): Payer: 59 | Admitting: Family Medicine

## 2019-05-03 ENCOUNTER — Other Ambulatory Visit: Payer: Self-pay

## 2019-05-03 ENCOUNTER — Telehealth: Payer: Self-pay | Admitting: *Deleted

## 2019-05-03 ENCOUNTER — Other Ambulatory Visit: Payer: 59

## 2019-05-03 DIAGNOSIS — R6889 Other general symptoms and signs: Secondary | ICD-10-CM | POA: Diagnosis not present

## 2019-05-03 DIAGNOSIS — Z20822 Contact with and (suspected) exposure to covid-19: Secondary | ICD-10-CM

## 2019-05-03 DIAGNOSIS — Z20828 Contact with and (suspected) exposure to other viral communicable diseases: Secondary | ICD-10-CM

## 2019-05-03 NOTE — Progress Notes (Signed)
WT- 220LB BP- N/A T- 97.3 (O) [facility]- [place]

## 2019-05-03 NOTE — Telephone Encounter (Addendum)
Contacted pt to arrange COVID testing; pt offered and accepted appointment at [facility] site 05/03/2019 at 1045; pt given address, location, and instructions that she and occupants should wear masks; she verbalizes understanding; orders placed per protocol.   ----- Message from Lovenia Kim, MD sent at    05/03/2019  9:17 AM EDT ----- Regarding: covid testing COVID Drive-Up Test Referral Criteria   Patient age: 38 y.o. requesting testing for COVID as she has been around two individuals with positive tests.   Symptoms: Cough  Underlying Conditions: HTN and Diabetes  Is the patient a first responder? No  Does the patient live or work in a high risk or high density environment: No  Is the patient a COVID convalescent patient who is 14-28 days symptom-free and interested in donating plasma for use as a therapeutic product? No

## 2019-05-03 NOTE — Progress Notes (Signed)
[place] Telemedicine Visit  Patient consented to have virtual visit. Method of visit: Telephone  Encounter participants: Patient: [name] - located at home  Provider: Lovenia Kim - located at clinic Others (if applicable): N/A   Chief Complaint:   HPI: Patient reports she is concerned about her health as her brother and his girlfriend both tested positive for COVID-19 recently.  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   She reports cough but no symptoms of fever, chills, shortness of breath, sore throat, loss of sense of smell or taste.  She requests to be tested so that she knows if she should also quarantine. No other concerns at this time.   ROS: per HPI  Pertinent PMHx: T2DM, HTN   Exam:  General: pleasant  38 yo female Respiratory: no acute distress, normal effort, speaking in full sentences    Assessment/Plan:  Exposure to COVID-19 New cough however no other symptoms present at this time.  Reasonable to test for COVID-19 given recent close contact with two individuals who tested positive.  Message sent for COVID drive-up testing and informed patient of instructions.   Advised to self-quarantine in the meantime.  ED precautions discussed.    Time spent during visit with patient: 15 minutes  Lovenia Kim MD

## 2019-05-06 LAB — NOVEL CORONAVIRUS, NAA: SARS-CoV-2, NAA: NOT DETECTED

## 2019-05-16 ENCOUNTER — Encounter: Payer: Self-pay | Admitting: Student in an Organized Health Care Education/Training Program

## 2019-05-16 ENCOUNTER — Ambulatory Visit (INDEPENDENT_AMBULATORY_CARE_PROVIDER_SITE_OTHER): Payer: 59 | Admitting: Student in an Organized Health Care Education/Training Program

## 2019-05-16 ENCOUNTER — Other Ambulatory Visit: Payer: Self-pay

## 2019-05-16 VITALS — BP 132/86 | HR 108

## 2019-05-16 DIAGNOSIS — E038 Other specified hypothyroidism: Secondary | ICD-10-CM

## 2019-05-16 DIAGNOSIS — F411 Generalized anxiety disorder: Secondary | ICD-10-CM

## 2019-05-16 DIAGNOSIS — R69 Illness, unspecified: Secondary | ICD-10-CM | POA: Diagnosis not present

## 2019-05-16 DIAGNOSIS — Z794 Long term (current) use of insulin: Secondary | ICD-10-CM | POA: Diagnosis not present

## 2019-05-16 DIAGNOSIS — E1165 Type 2 diabetes mellitus with hyperglycemia: Secondary | ICD-10-CM | POA: Diagnosis not present

## 2019-05-16 DIAGNOSIS — E039 Hypothyroidism, unspecified: Secondary | ICD-10-CM

## 2019-05-16 DIAGNOSIS — IMO0001 Reserved for inherently not codable concepts without codable children: Secondary | ICD-10-CM

## 2019-05-16 LAB — POCT GLYCOSYLATED HEMOGLOBIN (HGB A1C): HbA1c, POC (controlled diabetic range): 13.1 % — AB (ref 0.0–7.0)

## 2019-05-16 MED ORDER — CANAGLIFLOZIN 300 MG PO TABS: 300.0000 mg | ORAL_TABLET | Freq: Every day | ORAL | 6 refills | Status: DC

## 2019-05-16 MED ORDER — GLIPIZIDE 10 MG PO TABS: ORAL_TABLET | ORAL | 1 refills | Status: DC

## 2019-05-16 MED ORDER — LEVOTHYROXINE SODIUM 100 MCG PO TABS: 100.0000 ug | ORAL_TABLET | Freq: Every day | ORAL | 3 refills | Status: DC

## 2019-05-16 NOTE — Patient Instructions (Signed)
It was a pleasure seeing you today in our clinic. Here is the treatment plan we have discussed and agreed upon together:  We drew blood work at today's visit. I will call or send you a letter with these results. If you do not hear from me within the next week, please give our office a call.  Please schedule an appointment to follow up in 1 month.  Our clinic's number is [phone]. Please call with questions or concerns about what we discussed today.  Be well, Dr. Burr Medico

## 2019-05-16 NOTE — Progress Notes (Signed)
CC: Diabetes  HPI: [name] is a 38 y.o. female with PMH significant for: Patient Active Problem List   Diagnosis Date Noted  . Reactive depression 12/29/2016  . IUD (intrauterine device) in place 12/27/2016  . Lateral knee pain, right 09/06/2016  . Abdominal pain 04/22/2016  . Candidiasis of vagina 04/21/2016  . Quality of life palliative care encounter 04/08/2016  . Diabetes type 2, uncontrolled ([place]) 07/17/2015  . Boil of buttock 01/28/2015  . Vaginal discharge 01/28/2015  . SOB (shortness of breath) 09/05/2014  . Preventive measure 08/07/2014  . Other malaise and fatigue 07/09/2014  . Obesity, Class II, BMI 35-39.9, with comorbidity ([place]) 07/09/2014  . Proteinuria 01/10/2014  . Other specified hypothyroidism 01/10/2014  . Murmur 10/17/2013  . Sleep disorder 10/11/2013  . Headache(784.0) 09/16/2013  . Anxiety state 09/16/2013  . Leukopenia 08/01/2012  . History of Hodgkin's lymphoma 07/27/2012  . Pulmonary infiltrates 07/27/2012  . Hypertension associated with diabetes ([place]) 01/19/2012     Anxiety Patient expresses difficulty sleeping, difficulty taking care of herself, and difficulty functioning due to anxiety. She has hx of depression on her problem list. She reports that her anxiety does interfere with her ability to stay on top of managing her diabetes. She is already connected with counseling at her workplace and has an appointment made.  DIABETES Disease Monitoring: A1c 14 in 08/2018, 13.1 today. Patient reports that she has not followed up well due to numerous stressors in her life including having family member sick with COVID-19. She was told that she needed to come to this appointment to obtain refills on her medications. Patient reports that she has not been checking her CBG's and she has been out of medicine for a month. She reports that she has felt physically unwell (tired) and she attributes this to fluctuating blood sugars. She is motivated to get  her diabetes back on track. No   Hypothyroidism Patient has been nonadherent to thyroid medication for at least one month, perhaps longer. Previously on 100 mcg daily dose. No palpitations or chest pain. No hair changes. No GI symptoms. She does notice decreased energy.  Monitoring Labs and Parameters Last A1C:  Lab Results  Component Value Date   HGBA1C 13.1 (A) 05/16/2019    Last Lipid:     Component Value Date/Time   CHOL 222 (H) 01/18/2018 1119   HDL 87 01/18/2018 1119    Last Bmet  Potassium  Date Value Ref Range Status  04/12/2018 4.2 3.5 - 5.1 mmol/L Final  04/13/2017 4.5 3.5 - 5.1 mEq/L Final   Sodium  Date Value Ref Range Status  04/12/2018 135 (L) 136 - 145 mmol/L Final  01/18/2018 134 134 - 144 mmol/L Final  04/13/2017 134 (L) 136 - 145 mEq/L Final   Creatinine  Date Value Ref Range Status  04/12/2018 0.97 0.60 - 1.10 mg/dL Final  04/13/2017 1.0 0.6 - 1.1 mg/dL Final      Last BPs:  BP Readings from Last 3 Encounters:  05/16/19 132/86  01/08/19 (!) 145/90  09/05/18 138/84    Review of Symptoms:  See HPI for ROS.   CC, SH/smoking status, and VS noted.  Objective: BP 132/86   Pulse (!) 108   SpO2 93%  GEN: NAD, alert, cooperative, and pleasant. EYE: no conjunctival injection, pupils equally round and reactive to light ENMT: normal tympanic light reflex, no nasal polyps,no rhinorrhea, no pharyngeal erythema or exudates NECK: full ROM RESPIRATORY: clear to auscultation bilaterally with no wheezes, rhonchi  or rales, good effort CV: RRR, no m/r/g GI: soft, non-tender, non-distended SKIN: warm and dry, no rashes or lesions NEURO: II-XII grossly intact PSYCH: AAOx3, anxious affect, moderately pressured speech throughout the interview  Assessment and plan:  Anxiety state Continue therapy through her workplace Follow up in 1 month to ensure she is able to control anxiety and stay adherent to diabetes regimen  Diabetes type 2, uncontrolled ([place])  Nonadherent. Refilled glipizide and invokana at previous doses. Patient to start checking CBGs and monitor diabetes closely. Patient to follow up in 1 month for close monitoring and support.  Other specified hypothyroidism TSH checked today and found to be elevated 8.160 consistent with poor medication adherence. Synthroid refilled at previous dose. Recommend rechecking in 4-6 weeks after med adherence and consider titrating synthroid if necessary.   Orders Placed This Encounter  Procedures  . TSH  . POCT glycosylated hemoglobin (Hb A1C)    Meds ordered this encounter  Medications  . levothyroxine (SYNTHROID) 100 MCG tablet    Sig: Take 1 tablet (100 mcg total) by mouth daily.    Dispense:  90 tablet    Refill:  3  . glipiZIDE (GLUCOTROL) 10 MG tablet    Sig: Take 1 tab daily x 3 days, then 1 tab in the AM and 1 tab in PM daily    Dispense:  60 tablet    Refill:  1  . canagliflozin (INVOKANA) 300 MG TABS tablet    Sig: Take 1 tablet (300 mg total) by mouth daily before breakfast.    Dispense:  30 tablet    Refill:  6     Everrett Coombe, MD,MS,  PGY3 05/19/2019 10:33 PM

## 2019-05-17 LAB — TSH: TSH: 8.16 u[IU]/mL — ABNORMAL HIGH (ref 0.450–4.500)

## 2019-05-19 NOTE — Assessment & Plan Note (Signed)
TSH checked today and found to be elevated 8.160 consistent with poor medication adherence. Synthroid refilled at previous dose. Recommend rechecking in 4-6 weeks after med adherence and consider titrating synthroid if necessary.

## 2019-05-19 NOTE — Assessment & Plan Note (Signed)
Nonadherent. Refilled glipizide and invokana at previous doses. Patient to start checking CBGs and monitor diabetes closely. Patient to follow up in 1 month for close monitoring and support.

## 2019-05-19 NOTE — Assessment & Plan Note (Signed)
Continue therapy through her workplace Follow up in 1 month to ensure she is able to control anxiety and stay adherent to diabetes regimen

## 2019-05-20 ENCOUNTER — Telehealth: Payer: Self-pay

## 2019-05-20 NOTE — Telephone Encounter (Signed)
Received fax from [place] requesting prior authorization of Invokana 300mg  Tablets .  Form placed in MD's box for completion along.

## 2019-05-22 NOTE — Telephone Encounter (Signed)
Completed, placed in fax box

## 2019-05-24 ENCOUNTER — Other Ambulatory Visit: Payer: Self-pay | Admitting: Family Medicine

## 2019-05-24 ENCOUNTER — Telehealth: Payer: Self-pay | Admitting: Family Medicine

## 2019-05-24 MED ORDER — EMPAGLIFLOZIN 25 MG PO TABS: 25.0000 mg | ORAL_TABLET | Freq: Every day | ORAL | 3 refills | Status: DC

## 2019-05-24 NOTE — Telephone Encounter (Signed)
Called patient and introduced myself as her new PCP.  I told her that I was looking into a new medication for her diabetes since her insurance does not want to cover [place].  They do however have Jardiance on their formulary.  Patient has never tried [place] before and is willing to take this instead.  I told her that it is in the same class of medications as Invokana and she should take 1 pill/day.  I asked her to let me know if she has trouble obtaining the medication or has any side effects from the medication.  She expressed understanding.

## 2019-05-29 ENCOUNTER — Other Ambulatory Visit: Payer: Self-pay | Admitting: *Deleted

## 2019-05-29 NOTE — Patient Outreach (Addendum)
[facility] Care Management  05/29/2019  [name] 07/20/1981 [id number]   Telephone Screening Date referral received: 05/22/19 Initial outreach: 05/29/19 Insurance: A[employer]na Referral Trigger: Chronic disease management services indicated   Initial unsuccessful telephone call to patient's preferred (home/mobile)  number in order to complete high risk assessment; no answer, left HIPAA compliant voicemail message requesting return call.   Plan: This RNCM will route unsuccessful outreach letter with [place] Management pamphlet and 24 hour Nurse Advice Line Magnet to [place] Management clinical pool to be mailed to patient's home address. This RNCM will attempt another outreach within 4 business days.   Barrington Ellison RN,CCM,CDE [place] Management Coordinator Office Phone [phone] Office Fax [phone]

## 2019-06-12 ENCOUNTER — Other Ambulatory Visit: Payer: Self-pay | Admitting: *Deleted

## 2019-06-12 NOTE — Patient Outreach (Signed)
[facility] [facility]) Care Management  06/12/2019  [name] 06-Jul-1981 [id number]   Telephone Screening Date referral received: 05/22/19 Initial outreach: 05/29/19 Insurance: A[employer]na Referral Trigger:Chronic disease management services indicated   Second telephone outreach attempt unsuccessful  to patient's preferred (home/mobile) number in order to complete high risk assessment; no answer, left HIPAA compliant voicemail message requesting return call.   Plan: This RNCM will attempt another outreach within 4 business days.   Barrington Ellison RN,CCM,CDE [place] Management Coordinator Office Phone [phone] Office Fax [phone]

## 2019-06-17 ENCOUNTER — Ambulatory Visit: Payer: Self-pay | Admitting: *Deleted

## 2019-06-17 ENCOUNTER — Other Ambulatory Visit: Payer: Self-pay | Admitting: *Deleted

## 2019-06-17 NOTE — Patient Outreach (Signed)
[facility]) Care Management  06/17/2019  JANINE RELLER [birth year] [id number]   Case Closure- Unsuccessful Outreach Date referral received:05/22/19 Initial outreach:05/29/19 Insurance: [employer] Referral Trigger:Chronic disease management services indicated  Unable to complete [employer] referral high risk screening;  no return call from client and no response to request to contact this RNCM in unsuccessful outreach letter mailed to client's home on  7/1 /20.    Plan: Case closed to [facility] Management services as it has been > 10 business days since initial outreach attempt.   Barrington Ellison RN,CCM,CDE [place] Management Coordinator Office Phone [phone] Office Fax [phone]

## 2019-07-08 ENCOUNTER — Ambulatory Visit: Payer: 59 | Admitting: Family Medicine

## 2019-07-11 ENCOUNTER — Other Ambulatory Visit: Payer: Self-pay

## 2019-07-11 ENCOUNTER — Telehealth: Payer: Self-pay

## 2019-07-11 ENCOUNTER — Ambulatory Visit (INDEPENDENT_AMBULATORY_CARE_PROVIDER_SITE_OTHER): Payer: 59 | Admitting: Family Medicine

## 2019-07-11 ENCOUNTER — Encounter: Payer: Self-pay | Admitting: Family Medicine

## 2019-07-11 VITALS — BP 140/98 | HR 90 | Ht 68.0 in | Wt 220.4 lb

## 2019-07-11 DIAGNOSIS — E1159 Type 2 diabetes mellitus with other circulatory complications: Secondary | ICD-10-CM | POA: Diagnosis not present

## 2019-07-11 DIAGNOSIS — R69 Illness, unspecified: Secondary | ICD-10-CM | POA: Diagnosis not present

## 2019-07-11 DIAGNOSIS — I10 Essential (primary) hypertension: Secondary | ICD-10-CM

## 2019-07-11 DIAGNOSIS — E1165 Type 2 diabetes mellitus with hyperglycemia: Secondary | ICD-10-CM

## 2019-07-11 DIAGNOSIS — I152 Hypertension secondary to endocrine disorders: Secondary | ICD-10-CM

## 2019-07-11 DIAGNOSIS — F411 Generalized anxiety disorder: Secondary | ICD-10-CM | POA: Diagnosis not present

## 2019-07-11 DIAGNOSIS — IMO0001 Reserved for inherently not codable concepts without codable children: Secondary | ICD-10-CM

## 2019-07-11 DIAGNOSIS — E038 Other specified hypothyroidism: Secondary | ICD-10-CM | POA: Diagnosis not present

## 2019-07-11 LAB — POCT UA - MICROALBUMIN
Albumin/Creatinine Ratio, Urine, POC: >300
Creatinine, POC: 50 mg/dL
Microalbumin Ur, POC: 150 mg/L

## 2019-07-11 MED ORDER — SEMAGLUTIDE 3 MG PO TABS: 3.0000 mg | ORAL_TABLET | Freq: Every day | ORAL | 1 refills | Status: DC

## 2019-07-11 MED ORDER — ACCU-CHEK SOFT TOUCH LANCETS MISC: 99 refills | Status: DC

## 2019-07-11 MED ORDER — AMLODIPINE BESYLATE 10 MG PO TABS: 10.0000 mg | ORAL_TABLET | Freq: Every day | ORAL | 3 refills | Status: DC

## 2019-07-11 MED ORDER — INSULIN PEN NEEDLE 32G X 4 MM MISC: 1.0000 | 99 refills | Status: DC | PRN

## 2019-07-11 MED ORDER — GLIPIZIDE 10 MG PO TABS: ORAL_TABLET | ORAL | 1 refills | Status: DC

## 2019-07-11 MED ORDER — FLUOXETINE HCL 20 MG PO TABS: 20.0000 mg | ORAL_TABLET | Freq: Every day | ORAL | 3 refills | Status: DC

## 2019-07-11 MED ORDER — EMPAGLIFLOZIN 25 MG PO TABS: 25.0000 mg | ORAL_TABLET | Freq: Every day | ORAL | 3 refills | Status: DC

## 2019-07-11 NOTE — Assessment & Plan Note (Signed)
Will start amlodipine 10 mg today.

## 2019-07-11 NOTE — Patient Instructions (Addendum)
It was nice meeting you today [name]!  For your diabetes, I am sending oral medications semaglutide, empagliflozin (Jardiance), and [facility] [address] [place]  I am also refilling your lancets and needles.  Try to take blood sugars once each morning before breakfast.  You do not have to remember to do this every day, but a few measurements each week would be helpful so that we can discuss this when you see me again.  Please let me know if you can get these medications or if you have any problems in their cost since we have options if they are too expensive.  For your anxiety, we are starting Prozac 20 mg once daily.  You can take this any time of day.  I would like to see you back in 1 month to talk about how this medication is working for you and check a hemoglobin A1c.  Please make an appointment with the cancer center to follow-up with him about your headache and lymph node.  This will likely give you some peace of mind going forward.  We are checking your urine protein, electrolytes and kidney function, and your thyroid level today.  I will be in touch regarding these results.  If you have any questions or concerns, please feel free to call the clinic.   Be well,  Dr. Shan Levans

## 2019-07-11 NOTE — Telephone Encounter (Signed)
Patient calls nurse line stating Vania Rea is not covered by her insurance. Patient stated provider wanted to know if Vania Rea was not covered. Forwarding to MD.

## 2019-07-11 NOTE — Progress Notes (Signed)
Subjective:    [name] - 38 y.o. female MRN [id number]  Date of birth: March 23, 1981  CC:  [name] is here for follow up of her diabetes.  HPI:  Type 2 diabetes Patient reports that she has been unable to afford invokana and jardiance due to her insurance not covering these medications.  She has also been unable to afford Trulicity.  [facility] [employer] [facility], [facility] in hopes that this will make a difference.  She has been taking her glipizide daily.  She says that she recently moved and lost her lancets and needles, so she has been unable to check her blood sugars.  She reports having some leg pain and restlessness at night but denies blurry vision, polyuria, polydipsia, and polyphagia.  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  She also says that she cannot tolerate metformin since it has given her nausea and diarrhea.  She thinks that she can tolerate giving herself an injection once weekly but would prefer an oral GLP-1 agonist compared to an injectable.  Hypothyroidism Says that she has been taking her Synthroid daily as directed since her previous visit 2 months ago.  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  This has led to needing to call the police on several occasions.  She says that her staff also have not wanted to work as much, so she is feeling more pressure to do their jobs as well.  She says that her job used to be a source of joy for her but is now very anxiety provoking.  She is also taking care of her mother and has an 18 year old daughter.  They recently got a new puppy as well.  She says that she only sleeps a few hours per night because she has trouble falling asleep.  She is looking [facility]   Health Maintenance:  Health Maintenance Due  Topic Date Due   FOOT EXAM  11/06/1993   TETANUS/TDAP  11/06/2002   URINE MICROALBUMIN  01/10/2015   OPHTHALMOLOGY EXAM  12/08/2017   INFLUENZA VACCINE  06/29/2019    -  reports that she quit smoking about 16 years ago. Her smoking use included cigarettes. She has a 1.25 pack-year smoking history. She has never used smokeless tobacco. - Review of Systems: Per HPI. - Past Medical History: Patient Active Problem List   Diagnosis Date Noted   Reactive depression 12/29/2016   IUD (intrauterine device) in place 12/27/2016   Lateral knee pain, right 09/06/2016   Abdominal pain 04/22/2016   Candidiasis of vagina 04/21/2016   Quality of life palliative care encounter 04/08/2016   Diabetes type 2, uncontrolled ([place]) 07/17/2015   Boil of buttock 01/28/2015   Vaginal discharge 01/28/2015   SOB (shortness of breath) 09/05/2014   Preventive measure 08/07/2014   Other malaise and fatigue 07/09/2014   Obesity, Class II, BMI 35-39.9, with comorbidity ([place]) 07/09/2014   Proteinuria 01/10/2014   Other specified hypothyroidism 01/10/2014   Murmur 10/17/2013   Sleep disorder 10/11/2013   Headache(784.0) 09/16/2013   Anxiety state 09/16/2013   Leukopenia 08/01/2012   History of Hodgkin's lymphoma 07/27/2012   Pulmonary infiltrates 07/27/2012   Hypertension associated  with diabetes ([place]) 01/19/2012   - Medications: reviewed and updated   Objective:   Physical Exam BP (!) 140/98    Pulse 90    Ht 5\' 8"  (1.727 m)    Wt 220 lb 6 oz (100 kg)    SpO2 98%    BMI 33.51 kg/m  Gen: NAD, alert, cooperative with exam, well-appearing Psych: good insight, alert and oriented, slightly anxious affect  GAD 7 : Generalized Anxiety Score 07/11/2019 02/22/2017  Nervous, Anxious, on Edge 3 0  Control/stop worrying 3 0  Worry too much - different things 3 0  Trouble relaxing 3 0  Restless 1 0    Easily annoyed or irritable 1 0  Afraid - awful might happen 0 0  Total GAD 7 Score 14 0  Anxiety Difficulty Somewhat difficult -       Assessment & Plan:   Diabetes type 2, uncontrolled ([place]) Discussed the importance of taking care of herself so that she can take care of others.  Discussed that have her hemoglobin A1c remains at this level, she will be at very high risk of needing dialysis and having heart, eye, and kidney problems in the future.  Her A1c also indicates that she would likely benefit from starting insulin therapy, but her needle phobia will prevent compliance with this.  We will send glipizide[facility] oral [facility]aglutide[facility]  Asked patient to call me in the next few days to let me know if she was able to get these medications, and if I do not hear from her, I will call her.  I also sent in new lancets and needles for her to test her blood sugar and encouraged her to test her blood sugar a few times per week and bring those numbers into her next appointment.  Will obtain BMP and urine microalbumin today.  I would like to see her back in about 1 month.  Other specified hypothyroidism Will obtain follow-up TSH today.  Hypertension associated with diabetes ([place]) Will start amlodipine 10 mg today.  Anxiety state Discussed the evidence showing that counseling and medication has shown the most benefit for treating anxiety.  Patient is amenable to starting medication today.  We discussed several options, and we decided on Prozac since she has a friend who has benefited from this medication.  Also encouraged [facility]  Prozac 20 mg once daily was sent to her pharmacy.  We will follow-up with her anxiety in 1 month.    Maia Breslow, M.D. 07/11/2019, 1:14 PM PGY-3, [place]

## 2019-07-11 NOTE — Assessment & Plan Note (Addendum)
Discussed the importance of taking care of herself so that she can take care of others.  Discussed that have her hemoglobin A1c remains at this level, she will be at very high risk of needing dialysis and having heart, eye, and kidney problems in the future.  Her A1c also indicates that she would likely benefit from starting insulin therapy, but her needle phobia will prevent compliance with this.  We will send glipizide, oral semaglutide, [facility].  Asked patient to call me in the next few days to let me know if she was able to get these medications, and if I do not hear from her, I will call her.  I also sent in new lancets and needles for her to test her blood sugar and encouraged her to test her blood sugar a few times per week and bring those numbers into her next appointment.  Will obtain BMP and urine microalbumin today.  I would like to see her back in about 1 month.

## 2019-07-11 NOTE — Assessment & Plan Note (Signed)
Will obtain follow-up TSH today.

## 2019-07-11 NOTE — Assessment & Plan Note (Addendum)
Discussed the evidence showing that counseling and medication has shown the most benefit for treating anxiety.  Patient is amenable to starting medication today.  We discussed several options, and we decided on Prozac since she has a friend who has benefited from this medication.  Also encouraged [facility]  Prozac 20 mg once daily was sent to her pharmacy.  We will follow-up with her anxiety in 1 month.

## 2019-07-12 ENCOUNTER — Other Ambulatory Visit: Payer: Self-pay | Admitting: Family Medicine

## 2019-07-12 LAB — BASIC METABOLIC PANEL
BUN/Creatinine Ratio: 12 (ref 9–23)
BUN: 10 mg/dL (ref 6–20)
CO2: 23 mmol/L (ref 20–29)
Calcium: 9.5 mg/dL (ref 8.7–10.2)
Chloride: 100 mmol/L (ref 96–106)
Creatinine, Ser: 0.83 mg/dL (ref 0.57–1.00)
GFR calc Af Amer: 106 mL/min/{1.73_m2} (ref >59)
GFR calc non Af Amer: 92 mL/min/{1.73_m2} (ref >59)
Glucose: 329 mg/dL — ABNORMAL HIGH (ref 65–99)
Potassium: 4.7 mmol/L (ref 3.5–5.2)
Sodium: 136 mmol/L (ref 134–144)

## 2019-07-12 LAB — TSH: TSH: 11 u[IU]/mL — ABNORMAL HIGH (ref 0.450–4.500)

## 2019-07-12 MED ORDER — DAPAGLIFLOZIN PROPANEDIOL 5 MG PO TABS: 5.0000 mg | ORAL_TABLET | Freq: Every day | ORAL | 1 refills | Status: DC

## 2019-07-12 MED ORDER — LOSARTAN POTASSIUM 25 MG PO TABS: 25.0000 mg | ORAL_TABLET | Freq: Every day | ORAL | 3 refills | Status: DC

## 2019-07-12 MED ORDER — GABAPENTIN 100 MG PO CAPS: 100.0000 mg | ORAL_CAPSULE | Freq: Every day | ORAL | 3 refills | Status: DC

## 2019-07-12 NOTE — Telephone Encounter (Signed)
Discussed with Ms. Bushard that after working with our pharmacy team, it appears that Wilder Glade is covered by her insurance, so I have sent the lower dose of this medication to her pharmacy.  Also discussed that her TSH indicates that her thyroid level is still low.  She says that she does not remember to take her Synthroid every day, so we discussed that she will work hard on taking this every day and we will recheck a TSH when she sees me on 9/15.  Also told her that while her creatinine looks good, she does have a high amount of protein in her urine, so she would benefit from starting an angiotensin receptor blocker.  She was amenable to this plan, will start losartan 25 mg nightly today.  We also discussed her nightly leg discomfort, and we will start gabapentin 100 mg nightly as needed for this issue.  Discussed that she can take up to 300 mg at night if needed.  Patient expressed understanding to this plan and will see me for follow-up on 9/15.

## 2019-07-17 ENCOUNTER — Telehealth: Payer: Self-pay | Admitting: Family Medicine

## 2019-07-17 NOTE — Telephone Encounter (Signed)
It probably is.  Hopefully the prior authorization will work.  Would you let her know that I sent in this prior auth so hopefully it will be much cheaper if her insurance approves it?  Thanks

## 2019-07-17 NOTE — Telephone Encounter (Signed)
Patient calls nurse line stating she went to pick up the third choice medication yesterday, and this medication was also ~500 dollars. Patient could not tell me the name of the medication. I informed her we just filled out a PA on Semaglutide, not sure if this is the medication?

## 2019-07-17 NOTE — Telephone Encounter (Signed)
Filled out prior authorization form for oral semaglutide and placed and fax pile.

## 2019-07-18 ENCOUNTER — Encounter: Payer: Self-pay | Admitting: Family Medicine

## 2019-07-23 NOTE — Telephone Encounter (Signed)
Received fax that PA was approved

## 2019-07-24 ENCOUNTER — Encounter: Payer: Self-pay | Admitting: Family Medicine

## 2019-07-24 ENCOUNTER — Other Ambulatory Visit: Payer: Self-pay | Admitting: Family Medicine

## 2019-07-24 MED ORDER — OZEMPIC (0.25 OR 0.5 MG/DOSE) 2 MG/1.5ML ~~LOC~~ SOPN: 0.5000 mg | PEN_INJECTOR | SUBCUTANEOUS | 3 refills | Status: DC

## 2019-07-25 ENCOUNTER — Telehealth: Payer: Self-pay | Admitting: *Deleted

## 2019-07-25 NOTE — Telephone Encounter (Signed)
Submitted demographics via covermymeds.  Waiting for questions, could take up to 24 hours.  Will check back.   Cover My Meds info: Key: [id number]  [name], CMA

## 2019-07-26 NOTE — Telephone Encounter (Signed)
PA was approved but med will still cost $600, even with a coupon from pharmacy. [name], CMA

## 2019-07-26 NOTE — Telephone Encounter (Signed)
Ozempic. [name], CMA

## 2019-07-26 NOTE — Telephone Encounter (Signed)
Which medication is this regarding, Janett Billow?

## 2019-07-31 NOTE — Progress Notes (Deleted)
Insurance deductible [phone] - (remaining [phone]) -after pt meets deductible then copay for brand $45/30 days and $90/90 days  -picking a better plan (employer) next year  GLP-1 coverage -Ozempic ~800/30 days --> after coupon from pharmacy [id number] -Trulicity 0000000 days (PA; needs to try metformin first) -Victoza ~$678/30 days (PA; needs to try metformin first)  Insulin -Lantus pen (not covered) -Lantus vial $279  Plan: 1) Pay remaining deductible so refills are $30 afterwards 2) Novolin 70/30 -$25.88 for 1 vial; 33 units QAM (before breakfast) and 16 units QPM (before supper); MUST EAT REGULAR MEALS

## 2019-08-01 ENCOUNTER — Encounter: Payer: Self-pay | Admitting: Pharmacist

## 2019-08-01 ENCOUNTER — Other Ambulatory Visit: Payer: Self-pay

## 2019-08-01 ENCOUNTER — Ambulatory Visit (INDEPENDENT_AMBULATORY_CARE_PROVIDER_SITE_OTHER): Payer: 59 | Admitting: Pharmacist

## 2019-08-01 DIAGNOSIS — E1165 Type 2 diabetes mellitus with hyperglycemia: Secondary | ICD-10-CM

## 2019-08-01 MED ORDER — OZEMPIC (0.25 OR 0.5 MG/DOSE) 2 MG/1.5ML ~~LOC~~ SOPN: 0.2500 mg | PEN_INJECTOR | SUBCUTANEOUS | 0 refills | Status: DC

## 2019-08-01 MED ORDER — METFORMIN HCL ER 500 MG PO TB24: 500.0000 mg | ORAL_TABLET | Freq: Every day | ORAL | 3 refills | Status: DC

## 2019-08-01 NOTE — Patient Instructions (Signed)
Great to meet you today.    Please start Ozempic (Semaglutide) 0.25mg  once weekly by injection.   Start Metformin 500mg  XR once daily for 1 week then increase to two daily with meals.   Take Glipizide only if you are eating a meal (you can take twice daily if you eat two meals.)  Follow-up with Dr Shan Levans 9/15.   Please plan to follow-up with pharmacy clinic in 4-6 weeks.

## 2019-08-01 NOTE — Progress Notes (Signed)
S:     Chief Complaint  Patient presents with  . Medication Management    diabetes    Patient arrives in good spirits ambulating without assistance.  Presents for diabetes evaluation, education, and management Patient was referred and last seen by Primary Care Provider. Dr. Shan Levans, on 07/11/2019.   Patient reports Diabetes was diagnosed > 4 years ago.    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  Current diabetes medications include: glipizde 10mg  BID Current hypertension medications include: losartan 25mg  daily and amlodipine 10mg  daily Current hyperlipidemia medications include: None   Patient reported dietary habits: Eats 1-2 meals/day; first meal 2-3PM Breakfast: Maybe a banana  Lunch: Sometimes eats Dinner: salmon caesar salad, fruit cups, chicken, stir fries with chopped vegetables, pasta Snacks: nuts Drinks: smoothies, fresh juices, apple cidar vinegar drinks -Sometimes does liquid diet -No fast food   Patient-reported exercise habits: walks dog 15 min   O:  Physical Exam Constitutional:      Appearance: Normal appearance.  Psychiatric:        Mood and Affect: Mood normal.        Behavior: Behavior normal.    Review of Systems  Neurological: Positive for headaches.  All other systems reviewed and are negative.    Lab Results  Component Value Date   HGBA1C 13.1 (A) 05/16/2019   There were no vitals filed for this visit.  Lipid Panel     Component Value  Date/Time   CHOL 222 (H) 01/18/2018 1119   TRIG 87 01/18/2018 1119   HDL 87 01/18/2018 1119   CHOLHDL 2.6 01/18/2018 1119   CHOLHDL 2.5 07/08/2014 1046   VLDL 16 07/08/2014 1046   LDLCALC 118 (H) 01/18/2018 1119   Sunday BG=128 (after breakfast - eggs, sausage, pancakes with strawberry syrup)  Checks depending on how she is feeling  Pt reports not checking her BG consistently   Needs needles + lancets Has meter    Clinical ASCVD: N/A The ASCVD Risk score Mikey Bussing DC Jr., et al., 2013) failed to calculate for the following reasons:   The 2013 ASCVD risk score is only valid for ages 59 to 80   A/P: Diabetes currently uncontrolled. Patient is not able to verbalize appropriate hypoglycemia management plan. Patient is adherent with medication. Control is suboptimal due to insurance coverage, multiple life stressors, and high carbohydrate intake. -Started GLP-1 Ozempic 0.25mg  SQ weekly (generic name semaglutide). Samples provided.  Instructed how to use and patient was able to administer her first injection in the clinic. Pt verbalized understanding. -Started metformin XR 500mg  once daily. Will f/u and titrate to max dose if pt tolerates extended release formulation - Continue glipizide 10mg  BID for now. Discussed the risk for hypoglycemia if she does not eat and to only take it if she is going to eat a meal.  -Extensively discussed pathophysiology of DM, recommended lifestyle interventions, dietary effects on glycemic control -Counseled on s/sx of and management of hypoglycemia (rule of 15) -Discussed diet changes and reducing smoothies and fruit juices. Discussed eating 2 meals per day and involving more protein  and vegetables. -Discussed that we would like for her to check her BG once daily in the morning. -Next A1C anticipated 08/2019.    Written patient instructions provided. Total time in face to face counseling 60 minutes.   Follow up with PCP Clinic Visit on 9/15. Pharamcy will f/u  via telephone call in 2-4 weeks.

## 2019-08-01 NOTE — Assessment & Plan Note (Signed)
Diabetes currently uncontrolled. Patient is not able to verbalize appropriate hypoglycemia management plan. Patient is adherent with medication. Control is suboptimal due to insurance coverage, multiple life stressors, and high carbohydrate intake. -Started GLP-1 Ozempic 0.25mg  SQ weekly (generic name semaglutide). Samples provided.  Instructed how to use and patient was able to administer her first injection in the clinic. Pt verbalized understanding. -Started metformin XR 500mg  once daily. Will f/u and titrate to max dose if pt tolerates extended release formulation - Continue glipizide 10mg  BID for now. Discussed the risk for hypoglycemia if she does not eat and to only take it if she is going to eat a meal.  -Extensively discussed pathophysiology of DM, recommended lifestyle interventions, dietary effects on glycemic control -Counseled on s/sx of and management of hypoglycemia (rule of 15) -Discussed diet changes and reducing smoothies and fruit juices. Discussed eating 2 meals per day and involving more protein and vegetables. -Discussed that we would like for her to check her BG once daily in the morning. -Next A1C anticipated 08/2019.

## 2019-08-01 NOTE — Progress Notes (Signed)
Reviewed: I agree with Dr. Koval's documentation and management. 

## 2019-08-05 ENCOUNTER — Other Ambulatory Visit: Payer: Self-pay | Admitting: Family Medicine

## 2019-08-05 DIAGNOSIS — E1165 Type 2 diabetes mellitus with hyperglycemia: Secondary | ICD-10-CM

## 2019-08-13 ENCOUNTER — Ambulatory Visit (INDEPENDENT_AMBULATORY_CARE_PROVIDER_SITE_OTHER): Payer: 59 | Admitting: Family Medicine

## 2019-08-13 ENCOUNTER — Other Ambulatory Visit: Payer: Self-pay

## 2019-08-13 ENCOUNTER — Encounter: Payer: Self-pay | Admitting: Family Medicine

## 2019-08-13 VITALS — BP 110/70 | HR 82 | Ht 68.0 in | Wt 219.0 lb

## 2019-08-13 DIAGNOSIS — L308 Other specified dermatitis: Secondary | ICD-10-CM | POA: Diagnosis not present

## 2019-08-13 DIAGNOSIS — F411 Generalized anxiety disorder: Secondary | ICD-10-CM

## 2019-08-13 DIAGNOSIS — E038 Other specified hypothyroidism: Secondary | ICD-10-CM | POA: Diagnosis not present

## 2019-08-13 DIAGNOSIS — R69 Illness, unspecified: Secondary | ICD-10-CM | POA: Diagnosis not present

## 2019-08-13 DIAGNOSIS — E1165 Type 2 diabetes mellitus with hyperglycemia: Secondary | ICD-10-CM

## 2019-08-13 LAB — POCT GLYCOSYLATED HEMOGLOBIN (HGB A1C): HbA1c, POC (controlled diabetic range): 11 % — AB (ref 0.0–7.0)

## 2019-08-13 MED ORDER — OZEMPIC (0.25 OR 0.5 MG/DOSE) 2 MG/1.5ML ~~LOC~~ SOPN: 0.5000 mg | PEN_INJECTOR | SUBCUTANEOUS | 0 refills | Status: DC

## 2019-08-13 MED ORDER — TRIAMCINOLONE ACETONIDE 0.1 % EX CREA: 1.0000 "application " | TOPICAL_CREAM | Freq: Two times a day (BID) | CUTANEOUS | 2 refills | Status: DC

## 2019-08-13 MED ORDER — FLUOXETINE HCL 20 MG PO TABS: 40.0000 mg | ORAL_TABLET | Freq: Every day | ORAL | 3 refills | Status: DC

## 2019-08-13 NOTE — Progress Notes (Signed)
Subjective:    [name] - 38 y.o. female MRN [id number]  Date of birth: 03/20/1981  CC:  [name] is here for T2DM follow up.  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  Does have a history of eczema.  Denies wearing a necklace frequently.  Has been using hydrocortisone cream without much relief. Health Maintenance:  Health Maintenance Due  Topic Date Due  . FOOT EXAM  11/06/1993  . TETANUS/TDAP  11/06/2002  . OPHTHALMOLOGY EXAM  12/08/2017    -  reports that she quit smoking about 16 years ago. Her smoking use included cigarettes. She has a 1.25 pack-year smoking history. She has never used smokeless tobacco. - Review of Systems: Per HPI. - Past Medical History: Patient Active Problem List   Diagnosis Date Noted  . Reactive depression 12/29/2016  . IUD (intrauterine device) in place 12/27/2016  . Lateral knee pain, right 09/06/2016  . Abdominal pain 04/22/2016  . Quality of life palliative care encounter 04/08/2016  . Diabetes type 2, uncontrolled ([place]) 07/17/2015  . Vaginal discharge 01/28/2015  . SOB (shortness of breath) 09/05/2014   . Preventive measure 08/07/2014  . Other malaise and fatigue 07/09/2014  . Obesity, Class II, BMI 35-39.9, with comorbidity ([place]) 07/09/2014  . Proteinuria 01/10/2014  . Other specified hypothyroidism 01/10/2014  . Murmur 10/17/2013  . Sleep disorder 10/11/2013  . Headache(784.0) 09/16/2013  . Anxiety state 09/16/2013  . Leukopenia 08/01/2012  . History of Hodgkin's lymphoma 07/27/2012  . Pulmonary infiltrates 07/27/2012  . Hypertension associated with diabetes ([place]) 01/19/2012  . Eczema 01/19/2012   - Medications: reviewed and updated   Objective:   Physical Exam BP 110/70   Pulse 82   Ht 5\' 8"  (1.727 m)   Wt 219 lb (99.3 kg)   SpO2 98%   BMI 33.30 kg/m  Gen: NAD, alert, cooperative with exam, well-appearing, pleasant CV: RRR, good S1/S2, no murmur  Resp: CTABL, no wheezes, non-labored Skin: Eczematous rash on lateral right neck, no signs of infection or skin breakdown Psych: good insight, alert and oriented, appropriate affect, anxious mood    Assessment & Plan:   Other specified hypothyroidism Counseled patient that she will need to take her Synthroid an hour before breakfast and not with any other medications.  Patient verbalized understanding.  We will check her TSH at her next appointment.  Diabetes type 2, uncontrolled ([facility]) Hemoglobin A1c is 11 today, which is improved from 13.  We will continue her current medications but will increase her Ozempic dose from 0.25 mg to 0.5 mg weekly.  Is currently receiving Ozempic samples due to her high deductible plan.  Would like to start  her on an SGLT2 inhibitor, but none of the options are affordable with her insurance plan.  She will work on changing her insurance through her work at the next opportunity.  Would like to increase her metformin in the future, but will hold off since patient is still experiencing some nausea.  Congratulated patient on her progress and encouraged continued lifestyle changes.  Would like to see her  back in 3 months and will perform a foot exam at that time.  Anxiety state Patient has many environmental factors that are influencing her anxiety, but we will increase her Prozac to 40 mg daily in order to help her manage the stressors better.  We will check in with her regarding her anxiety at her next visit.  Eczema Will prescribe triamcinolone cream for up to twice daily application to the affected area.  Patient was counseled on the side effects of triamcinolone, including skin thinning and skin lightening, and she was advised to take a few days off after using this for a week consecutively.  She was advised to let me know if triamcinolone cream does not resolve her rash.    Maia Breslow, M.D. 08/14/2019, 5:39 PM PGY-3, [place]

## 2019-08-13 NOTE — Patient Instructions (Addendum)
It was nice seeing you today [name]!  Your hemoglobin A1c is 11 today, which is improved from 13.  Congratulations on your hard work!  Please continue your diabetes medications including glipizide twice daily, metformin at night, and Ozempic 0.5 mg weekly, which is an increase from your previous dose.  Please let me know if you have any issues with this new dose.  Your goals are to work on eating healthy foods more frequently and to get regular exercise.  This will help with your stress levels as well.  We are increasing her Prozac from 20 mg to 40 mg.  Please let me know if you have any issues with this new dose.  Your rash looks like eczema.  I am prescribing triamcinolone cream, which you should apply before bed at night.  Please use this for 1 week and then take a break to prevent skin lightening.  You can increase this to twice daily if needed, but continue taking breaks when applying this medication.  I would like to see you back in about 3 months or earlier if needed.  If you have any questions or concerns, please feel free to call the clinic.   Be well,  Dr. Shan Levans

## 2019-08-14 NOTE — Assessment & Plan Note (Addendum)
Hemoglobin A1c is 11 today, which is improved from 13.  We will continue her current medications but will increase her Ozempic dose from 0.25 mg to 0.5 mg weekly.  Is currently receiving Ozempic samples due to her high deductible plan.  Would like to start her on an SGLT2 inhibitor, but none of the options are affordable with her insurance plan.  She will work on changing her insurance through her work at the next opportunity.  Would like to increase her metformin in the future, but will hold off since patient is still experiencing some nausea.  Congratulated patient on her progress and encouraged continued lifestyle changes.  Would like to see her back in 3 months and will perform a foot exam at that time.

## 2019-08-14 NOTE — Assessment & Plan Note (Signed)
Counseled patient that she will need to take her Synthroid an hour before breakfast and not with any other medications.  Patient verbalized understanding.  We will check her TSH at her next appointment.

## 2019-08-14 NOTE — Assessment & Plan Note (Signed)
Will prescribe triamcinolone cream for up to twice daily application to the affected area.  Patient was counseled on the side effects of triamcinolone, including skin thinning and skin lightening, and she was advised to take a few days off after using this for a week consecutively.  She was advised to let me know if triamcinolone cream does not resolve her rash.

## 2019-08-14 NOTE — Assessment & Plan Note (Signed)
Patient has many environmental factors that are influencing her anxiety, but we will increase her Prozac to 40 mg daily in order to help her manage the stressors better.  We will check in with her regarding her anxiety at her next visit.

## 2019-08-19 ENCOUNTER — Telehealth: Payer: Self-pay | Admitting: Pharmacist

## 2019-08-19 NOTE — Telephone Encounter (Signed)
Also,  1. Pt stated she has had a 4 lb weight loss after initiation of Ozempic. 2. Recommended pt to try an over-the-counter medication such as ginger or dramamine for nausea

## 2019-08-19 NOTE — Telephone Encounter (Signed)
Contacted pt on 08/19/19 at 4:20 PM for f/u regarding toleration of DM pharmacotherapy regimen.  Pt recently received results of updated HbA1c level, which was 11.0 (08/13/19).Last HbA1c was 13.1 in 04/2019; therefore, HbA1c has decreased. Pt is ecstatic about news. Congratulated pt for her success!  Patient states she has been checking her fasting BG once daily in the morning and her readings are typically ~120. She states she has not seen any BG readings over 120 since last appt with PharmD clinic on 08/01/19. The lowest BG reading she has seen was 90. She states when her BG was 90, she felt "bad", specifically experiencing significant dizziness. Discussed with pt when BG readings lower significantly it is possibile she may experience s/sx similar to hypoglycemia at BG readings > 70. Clarified with pt she should not treat any BG readings unless they are <70 mg/dL; pt verbalized understanding.  Pt verified current DM regimen (metformin, Ozempic, glipizide). She states when she last saw PCP (08/13/19) she was instructed to increase Ozempic from 0.25 mg weekly to 0.5 mg weekly (pt stated she tolerated DM regimen at that visit). Pt states she increased Ozempic dose last Thursday 08/15/19 and since then has been experiencing significant nausea/GI upset. It appears there may have been confusion regarding Ozempic dosing schedule. It is recommended to titrate Ozempic dose slowly due to GI side effects; specifically, pt should administer Ozempic 0.25 mg weekly dose for 4 weeks (total of 4 doses) before increasing to 0.5 mg weekly dose. It appears patient increased to 0.5 mg weekly dose after 2 weeks of 0.25 mg doses (total of 2 doses), which likely explains why patient is experiencing GI upset/nausea. Instructed pt to decrease to Ozempic 0.25 mg weekly dose on Thursday 08/22/19 and to continue metformin and glipizide at current doses.   Plan to follow up with pt via telephone on 08/29/19 in the afternoon to determine how pt  is tolerating DM regimen.

## 2019-08-29 NOTE — Telephone Encounter (Signed)
Called pt and left HIPAA compliant VM on 08/29/19 at 4:39 PM.   Would like to instruct pt to increase to 0.5 mg weekly dose of Ozempic.  Plan to f/u again on 09/02/19.

## 2019-09-02 NOTE — Telephone Encounter (Signed)
Called pt and left HIPAA compliant VM on 09/02/19 at 10:59 AM with instructions to call back clinic.   Would like to instruct pt to increase to 0.5 mg weekly dose of Ozempic.  Plan to f/u again on 09/05/19.

## 2019-09-05 NOTE — Telephone Encounter (Signed)
Called pt and left HIPAA compliant VM on 09/05/19 at 12:37 PM with instructions to call back clinic.   Would like to instruct pt to increase to 0.5 mg weekly dose of Ozempic.

## 2019-09-06 ENCOUNTER — Telehealth: Payer: Self-pay | Admitting: Pharmacist

## 2019-09-06 NOTE — Telephone Encounter (Signed)
Attempted to call patient on 09/06/2019 at 1000 to assess her current Ozempic dose with hopes to increase to 0.5 mg weekly.   She did not answer so a HIPAA compliant voicemail was left explaining the reason for the call and that we will attempt to call again at a later date. She was encouraged to call the clinic with any questions or concerns.    Thank you,   Eddie Candle, PharmD PGY-1 Pharmacy Resident

## 2019-09-10 NOTE — Telephone Encounter (Signed)
Attempted patient again, left HIPAA compliant voicemail. Will try again

## 2019-09-16 ENCOUNTER — Telehealth: Payer: Self-pay | Admitting: Pharmacist

## 2019-09-16 NOTE — Telephone Encounter (Signed)
Contacted patient RE Ozempic (semaglutide) dosing.    Patient with previous GI intolerance when increasing dose from 0.25 to 0.5mg  once weekly noted that she was able to tolerate the 0.5mg  without intolerance when she took the 0.5mg  dose 4 days ago (last Thursday).   Blood sugars reported 64-150 with no fasting reading > 150.  She was happy with the response.    She plans to schedule PCP visit with Dr. Shan Levans, in early November.  At that time, I stated that additional samples of Ozempic could be provided - she is planning to changer in insurance selection in November to gain better coverage (hopefully for Ozempic) with a new medical insurance.   I congratulated her for her success and offered my support in the future if needed.

## 2019-09-16 NOTE — Telephone Encounter (Signed)
-----   Message from Leavy Cella, [facility] sent at 09/06/2019 12:23 PM EDT ----- Regarding: Ozempic dose increase 0.25 to 0.5mg  Seth Bake to make call

## 2019-09-30 ENCOUNTER — Other Ambulatory Visit: Payer: Self-pay | Admitting: *Deleted

## 2019-09-30 MED ORDER — METFORMIN HCL ER 500 MG PO TB24: 500.0000 mg | ORAL_TABLET | Freq: Every day | ORAL | 3 refills | Status: DC

## 2019-09-30 NOTE — Telephone Encounter (Signed)
Insurance requires a 90 day supply. [name], CMA

## 2019-10-04 ENCOUNTER — Telehealth: Payer: Self-pay | Admitting: Family Medicine

## 2019-10-04 NOTE — Telephone Encounter (Signed)
Patient needs more of her Ozempic. Patient says she takes her last shot next Thursday 10-10-2019. Her f/u appt with her PCP is not until December 3rd. Patient would like for [facility] to call her back to see how to get more meds.

## 2019-10-07 NOTE — Telephone Encounter (Signed)
Contacted patient to let her know Ozempic sample is ready for pick-up.  Patient reported tolerability of current dose of 0.5 mg and recent BG in the 140s.  Medication Samples have been provided to the patient.  Drug name: Ozempic (semaglutide)       Strength: 0.5 mg       Qty: 1 pen        LOT: [id number]        Exp.Date: 08/2021  Dosing instructions: Inject 0.5 mg once weekly.   The patient has been instructed regarding the correct time, dose, and frequency of taking this medication, including desired effects and most common side effects.   [name] 9:41 AM 10/07/2019  Follow-up with PCP for clinic visit on 10/31/2019.

## 2019-10-31 ENCOUNTER — Ambulatory Visit: Payer: 59 | Admitting: Family Medicine

## 2019-11-12 ENCOUNTER — Other Ambulatory Visit: Payer: Self-pay

## 2019-11-12 ENCOUNTER — Ambulatory Visit (INDEPENDENT_AMBULATORY_CARE_PROVIDER_SITE_OTHER): Payer: 59 | Admitting: Family Medicine

## 2019-11-12 ENCOUNTER — Encounter: Payer: Self-pay | Admitting: Family Medicine

## 2019-11-12 VITALS — BP 135/88 | HR 80 | Temp 98.6°F | Wt 216.0 lb

## 2019-11-12 DIAGNOSIS — F331 Major depressive disorder, recurrent, moderate: Secondary | ICD-10-CM | POA: Diagnosis not present

## 2019-11-12 DIAGNOSIS — E038 Other specified hypothyroidism: Secondary | ICD-10-CM | POA: Diagnosis not present

## 2019-11-12 DIAGNOSIS — Z23 Encounter for immunization: Secondary | ICD-10-CM | POA: Diagnosis not present

## 2019-11-12 DIAGNOSIS — E1165 Type 2 diabetes mellitus with hyperglycemia: Secondary | ICD-10-CM | POA: Diagnosis not present

## 2019-11-12 DIAGNOSIS — F411 Generalized anxiety disorder: Secondary | ICD-10-CM

## 2019-11-12 DIAGNOSIS — R69 Illness, unspecified: Secondary | ICD-10-CM | POA: Diagnosis not present

## 2019-11-12 LAB — POCT GLYCOSYLATED HEMOGLOBIN (HGB A1C): HbA1c, POC (controlled diabetic range): 6.3 % (ref 0.0–7.0)

## 2019-11-12 MED ORDER — METFORMIN HCL ER (OSM) 500 MG PO TB24: 500.0000 mg | ORAL_TABLET | Freq: Two times a day (BID) | ORAL | 3 refills | Status: DC

## 2019-11-12 MED ORDER — OZEMPIC (0.25 OR 0.5 MG/DOSE) 2 MG/1.5ML ~~LOC~~ SOPN: 0.5000 mg | PEN_INJECTOR | SUBCUTANEOUS | 0 refills | Status: DC

## 2019-11-12 MED ORDER — BUSPIRONE HCL 7.5 MG PO TABS: 7.5000 mg | ORAL_TABLET | Freq: Two times a day (BID) | ORAL | 2 refills | Status: DC

## 2019-11-12 NOTE — Progress Notes (Signed)
Subjective:    [name] - 38 y.o. female MRN [id number]  Date of birth: Feb 22, 1981  CC:  [name] is here for follow up of T2DM, anxiety.  HPI:  T2DM: Meds: Metformin 500 mg BID, glipizide once daily, Ozempic once weekly 0.5 mg, Needs a new sample of Ozempic today BS: does not take Started a new insurance plan, will have a much lower deductible Diet: will continue to have long periods of fasting during the day, is working on this Exercise: has a new puppy so is more active with her, although admits that she could be more active  Hypothyroidism: Is now taking synthroid an hour before food or other medications Feels "drained" and fatigued recently and wonders if her thyroid could be responsible for this  Mood: Has been more tearful recently, feels like this time of year is especially hard on her mood.  Also continues to be stressed from her job [place] a [place]. Will feel anxious especially at night and does not sleep well and is also fatigued during the day Takes her Prozac in the afternoon daily  Health Maintenance:  Health Maintenance Due  Topic Date Due  . TETANUS/TDAP  11/06/2002  . OPHTHALMOLOGY EXAM  12/08/2017    -  reports that she quit smoking about 16 years ago. Her smoking use included cigarettes. She has a 1.25 pack-year smoking history. She has never used smokeless tobacco. - Review of Systems: Per HPI. - Past Medical History: Patient Active Problem List   Diagnosis Date Noted  . Major depressive disorder, recurrent episode, moderate ([place]) 12/29/2016  . IUD (intrauterine device) in place 12/27/2016  . Controlled type 2 diabetes mellitus without complication, without long-term current use of insulin ([place]) 07/17/2015  . Preventive measure 08/07/2014  . Obesity, Class II, BMI 35-39.9, with comorbidity ([place]) 07/09/2014  . Proteinuria 01/10/2014  . Other specified hypothyroidism 01/10/2014  . Murmur 10/17/2013  . Sleep disorder  10/11/2013  . Headache(784.0) 09/16/2013  . Generalized anxiety disorder 09/16/2013  . Leukopenia 08/01/2012  . History of Hodgkin's lymphoma 07/27/2012  . Hypertension associated with diabetes ([place]) 01/19/2012  . Eczema 01/19/2012   - Medications: reviewed and updated   Objective:   Physical Exam BP 135/88   Pulse 80   Temp 98.6 F (37 C) (Oral)   Wt 216 lb (98 kg)   BMI 32.84 kg/m  Gen: NAD, alert, cooperative with exam, well-appearing HEENT: NCAT, clear conjunctiva, no thyroid nodules CV: RRR, good S1/S2, no murmur Resp: CTABL, no wheezes, non-labored Psych: good insight, alert and oriented, appropriate mood and affect  Diabetic Foot Exam - Simple   Simple Foot Form Diabetic Foot exam was performed with the following findings: Yes 11/12/2019  3:41 PM  Visual Inspection No deformities, no ulcerations, no other skin breakdown bilaterally: Yes Sensation Testing Intact to touch and monofilament testing bilaterally: Yes Pulse Check Posterior Tibialis and Dorsalis pulse intact bilaterally: Yes Comments    Depression screen [facility] 2/9 11/13/2019 07/11/2019 07/11/2019  Decreased Interest 2 0 0  Down, Depressed, Hopeless 3 0 0  PHQ - 2 Score 5 0 0  Altered sleeping 3 - -  Tired, decreased energy 3 - -  Change in appetite 1 - -  Feeling bad or failure about yourself  0 - -  Trouble concentrating 1 - -  Moving slowly or fidgety/restless 0 - -  Suicidal thoughts 0 - -  PHQ-9 Score 13 - -  Difficult doing work/chores Somewhat difficult - -  Some recent data might be hidden   GAD 7 : Generalized Anxiety Score 11/13/2019 07/11/2019 02/22/2017  Nervous, Anxious, on Edge 3 3 0  Control/stop worrying 3 3 0  Worry too much - different things 3 3 0  Trouble relaxing 2 3 0  Restless 2 1 0  Easily annoyed or irritable 3 1 0  Afraid - awful might happen 2 0 0  Total GAD 7 Score 18 14 0  Anxiety Difficulty Somewhat difficult Somewhat difficult -         Assessment & Plan:    Other specified hypothyroidism We will check a TSH today now that patient has been taking her Synthroid correctly.  Per chart review, it appears that we have not found a specific cause of her hypothyroidism, so we can consider obtaining antithyroperoxidase antibodies at her next visit since Hashimoto's thyroiditis is the most likely cause.  Controlled type 2 diabetes mellitus without complication, without long-term current use of insulin (HCC) Hemoglobin A1c has dropped from 13 to 11-6.3 in the last 6 months.  Patient was congratulated on this excellent improvement and told that if we can keep her A1c at this level, she may avoid many of the side effects of diabetes.  We will continue her current regimen of Metformin 500 mg twice daily, Ozempic 0.5 mg once weekly, and glipizide once daily.  When patient obtains better insurance, I will send Ozempic to her pharmacy rather than providing samples, and we can look into starting an SGLT2 inhibitor and stopping glipizide.  Patient counseled to see an ophthalmologist once yearly.  Foot exam performed today.  Major depressive disorder, recurrent episode, moderate (HCC) We will continue Prozac with possible plan to increase dose if patient would like at her next appointment.  Counseled patient on getting daily exercise to improve her mood and sleep quality.  Generalized anxiety disorder Patient's anxiety is likely disrupting her sleep and making her feel fatigued during the day, although her fatigue could also be due in part to possible undertreated hypothyroidism.  Encouraged patient to get daily exercise to improve her sleep quality.  We will also add buspirone 7.5 mg twice daily to patient's Prozac.  Patient was also encouraged to try taking her Prozac at different times of the day to see if this improves her sleep, since Prozac can sometimes be activating or sedating depending on the patient.    Maia Breslow, M.D. 11/13/2019, 10:57 AM PGY-3, [place]

## 2019-11-12 NOTE — Patient Instructions (Addendum)
It was nice seeing you today [name]!  Your A1c was 6.3 today, which is amazing!  Please continue doing what you are doing.  I would like to see you back in 3 months.  We are giving you samples of Ozempic today.  We are checking your TSH today to see how your thyroid is doing.  We will adjust your medication as needed based on this value.  For your anxiety, we are starting BuSpar, which you should take 2 times per day at any time that works for you.  If you have any difficulty obtaining this medication, please let me know.  You can also change the time of day you are taking Prozac to see if this helps as well.  Exercise goal: walk [place] for at least 20-30 minutes 4-5 times per week.  Work on eating more throughout the day to avoid fasting for more than 4 hours at a time  If you have any questions or concerns, please feel free to call the clinic.   Be well,  Dr. Shan Levans

## 2019-11-13 LAB — TSH: TSH: 3.08 u[IU]/mL (ref 0.450–4.500)

## 2019-11-13 NOTE — Assessment & Plan Note (Signed)
Hemoglobin A1c has dropped from 13 to 11-6.3 in the last 6 months.  Patient was congratulated on this excellent improvement and told that if we can keep her A1c at this level, she may avoid many of the side effects of diabetes.  We will continue her current regimen of Metformin 500 mg twice daily, Ozempic 0.5 mg once weekly, and glipizide once daily.  When patient obtains better insurance, I will send Ozempic to her pharmacy rather than providing samples, and we can look into starting an SGLT2 inhibitor and stopping glipizide.  Patient counseled to see an ophthalmologist once yearly.  Foot exam performed today.

## 2019-11-13 NOTE — Assessment & Plan Note (Signed)
Patient's anxiety is likely disrupting her sleep and making her feel fatigued during the day, although her fatigue could also be due in part to possible undertreated hypothyroidism.  Encouraged patient to get daily exercise to improve her sleep quality.  We will also add buspirone 7.5 mg twice daily to patient's Prozac.  Patient was also encouraged to try taking her Prozac at different times of the day to see if this improves her sleep, since Prozac can sometimes be activating or sedating depending on the patient.

## 2019-11-13 NOTE — Assessment & Plan Note (Signed)
We will continue Prozac with possible plan to increase dose if patient would like at her next appointment.  Counseled patient on getting daily exercise to improve her mood and sleep quality.

## 2019-11-13 NOTE — Assessment & Plan Note (Signed)
We will check a TSH today now that patient has been taking her Synthroid correctly.  Per chart review, it appears that we have not found a specific cause of her hypothyroidism, so we can consider obtaining antithyroperoxidase antibodies at her next visit since Hashimoto's thyroiditis is the most likely cause.

## 2019-12-03 ENCOUNTER — Other Ambulatory Visit: Payer: Self-pay

## 2019-12-03 ENCOUNTER — Telehealth (INDEPENDENT_AMBULATORY_CARE_PROVIDER_SITE_OTHER): Payer: 59 | Admitting: Family Medicine

## 2019-12-03 DIAGNOSIS — E119 Type 2 diabetes mellitus without complications: Secondary | ICD-10-CM | POA: Diagnosis not present

## 2019-12-03 DIAGNOSIS — R197 Diarrhea, unspecified: Secondary | ICD-10-CM | POA: Insufficient documentation

## 2019-12-03 MED ORDER — OMEPRAZOLE 20 MG PO CPDR: 20.0000 mg | DELAYED_RELEASE_CAPSULE | Freq: Every day | ORAL | 3 refills | Status: DC

## 2019-12-03 MED ORDER — ONDANSETRON 4 MG PO TBDP: 4.0000 mg | ORAL_TABLET | Freq: Three times a day (TID) | ORAL | 0 refills | Status: DC | PRN

## 2019-12-03 NOTE — Assessment & Plan Note (Signed)
Patient usually experiences 1 to 2 days of diarrhea after receiving Ozempic dose.  Over the last 2 weeks the diarrhea has lasted from Thursday when she takes her Ozempic to Sunday or Monday of the following week.  Yesterday patient had recurrent episodes of diarrhea from 4 AM to 8 PM.  She reports that she is also having issues with indigestion and nausea.  She is able to tolerate and keep down fluids and has not vomited.  She has taken Imodium, Pepto-Bismol, Nauzene, Tums. -Encourage continued p.o. fluid intake -Decreasing Ozempic dose and monitor for improvement of GI symptoms -Continue Imodium and Tums as needed -Tylenol for pain -Omeprazole 20 mg daily for indigestion -Zofran 4 mg every 8 hours as needed for nausea -Follow-up appointment in 2 weeks for monitoring of symptoms

## 2019-12-03 NOTE — Progress Notes (Signed)
[facility] Telemedicine Visit  Patient consented to have virtual visit. Method of visit: Video  Encounter participants: Patient: [name] - located at home Provider: Gifford Shave - located at home  Chief Complaint: GI issues   HPI: Patient reports that she usually has issues with diarrhea after receiving her Ozempic dose but that over the past 2 weeks her diarrhea has gotten worse and yesterday it was darker and the diarrhea was more consistent.  She also noticed she felt a little bit lightheaded yesterday.  She reports diarrhea from 4 AM-8 PM but that she has not had a bowel movement since 8 PM last night.  Patient was concerned that this was worsening symptoms from her his Ozempic.  Other symptoms that the patient is having include "terrible indigestion" as well as nausea.  Patient also notices that her chest is breaking out.  For the symptoms the patient initially took Imodium and then added Pepto-Bismol.  She was also taking Nauzene for the nausea and Tums for the indigestion.  Patient also reports she took a dose of ibuprofen on Sunday for pain.  Of note.  Patient reports that she has had GI symptoms after Ozempic ever since starting the medication but it usually only lasts for a day or 2 and resolves.  Her last dose was Thursday which is when she noticed her symptoms got worse.  Patient also was started on buspirone and her first dose of that was Thursday as well.  Patient denies any cough, congestion, runny nose, shortness of breath, loss of taste or smell.  She denies any sick contacts although she reports she works at [employer] and says she is unsure about customers but that she wears a mask when she is at work.  Patient lives at home with her husband and daughter.  No one else in the house is sick.  Patient does report she feels better right this minute.   ROS: per HPI  Pertinent PMHx: Type 2 diabetes  Exam:  Respiratory: Speaking in  clear sentences.  No shortness of breath noted on exam  Assessment/Plan:  Controlled type 2 diabetes mellitus without complication, without long-term current use of insulin ([place]) Patient A1c on last check was 6.3 on 11/12/2019.  She takes Metformin 500 mg twice daily and has been taking Ozempic 0.5 mg weekly and glipizide once daily.  She has GI symptoms associated with her Ozempic doses.  Over the last 2 weeks her diarrhea has lasted 4-5 days.  Given her great improvement with her A1c I would like for her to continue on the Ozempic but we are going to decrease the dose. -Decrease Ozempic dose to 0.25 weekly and monitor blood sugars -Continue Metformin and glipizide -Follow-up in 2 weeks to discuss GI symptoms as well as blood sugar trends.  Diarrhea in adult patient Patient usually experiences 1 to 2 days of diarrhea after receiving Ozempic dose.  Over the last 2 weeks the diarrhea has lasted from Thursday when she takes her Ozempic to Sunday or Monday of the following week.  Yesterday patient had recurrent episodes of diarrhea from 4 AM to 8 PM.  She reports that she is also having issues with indigestion and nausea.  She is able to tolerate and keep down fluids and has not vomited.  She has taken Imodium, Pepto-Bismol, Nauzene, Tums. -Encourage continued p.o. fluid intake -Decreasing Ozempic dose and monitor for improvement of GI symptoms -Continue Imodium and Tums as needed -Tylenol for pain -  Omeprazole 20 mg daily for indigestion -Zofran 4 mg every 8 hours as needed for nausea -Follow-up appointment in 2 weeks for monitoring of symptoms

## 2019-12-03 NOTE — Assessment & Plan Note (Signed)
Patient A1c on last check was 6.3 on 11/12/2019.  She takes Metformin 500 mg twice daily and has been taking Ozempic 0.5 mg weekly and glipizide once daily.  She has GI symptoms associated with her Ozempic doses.  Over the last 2 weeks her diarrhea has lasted 4-5 days.  Given her great improvement with her A1c I would like for her to continue on the Ozempic but we are going to decrease the dose. -Decrease Ozempic dose to 0.25 weekly and monitor blood sugars -Continue Metformin and glipizide -Follow-up in 2 weeks to discuss GI symptoms as well as blood sugar trends.

## 2019-12-18 ENCOUNTER — Other Ambulatory Visit: Payer: Self-pay

## 2019-12-18 ENCOUNTER — Telehealth (INDEPENDENT_AMBULATORY_CARE_PROVIDER_SITE_OTHER): Payer: 59 | Admitting: Family Medicine

## 2019-12-18 DIAGNOSIS — E119 Type 2 diabetes mellitus without complications: Secondary | ICD-10-CM | POA: Diagnosis not present

## 2019-12-18 NOTE — Assessment & Plan Note (Signed)
We will continue Ozempic at the reduced dose of 0.25 mg/week as well as the glipizide and Metformin.  I do think patient needs to continue all 3 medications to continue her good control of her diabetes since it has been very uncontrolled in the past.  We may be able to stop the glipizide in the future.  Discussed with patient that if her blood sugars increase, she should let me know.  She will also let me know if her symptoms do not continue to improve.

## 2019-12-18 NOTE — Progress Notes (Addendum)
[place] Telemedicine Visit  Patient consented to have virtual visit. Method of visit: Telephone  Encounter participants: Patient: [name] - located at work Provider: Kathrene Alu - located at [facility] Others (if applicable): none  Chief Complaint: follow up of diarrhea and indigestion  HPI:  [name] reports that her diarrhea and indigestion have improved greatly since reducing the dose of Ozempic from 0.5 mg/week to 0.25 mg/week.  She continues to take glipizide and Metformin as prescribed.  She says her blood sugars have been not concerning.    ROS: per HPI  Pertinent PMHx: Type 2 diabetes, anxiety, hypertension  Exam:  Respiratory: No shortness of breath or cough during our conversation  Assessment/Plan:  Controlled type 2 diabetes mellitus without complication, without long-term current use of insulin ([place]) We will continue Ozempic at the reduced dose of 0.25 mg/week as well as the glipizide and Metformin.  I do think patient needs to continue all 3 medications to continue her good control of her diabetes since it has been very uncontrolled in the past.  We may be able to stop the glipizide in the future.  Discussed with patient that if her blood sugars increase, she should let me know.  She will also let me know if her symptoms do not continue to improve.    Time spent during visit with patient: 5 minutes

## 2020-01-02 DIAGNOSIS — H52203 Unspecified astigmatism, bilateral: Secondary | ICD-10-CM | POA: Diagnosis not present

## 2020-01-02 DIAGNOSIS — H5213 Myopia, bilateral: Secondary | ICD-10-CM | POA: Diagnosis not present

## 2020-01-02 DIAGNOSIS — H25813 Combined forms of age-related cataract, bilateral: Secondary | ICD-10-CM | POA: Diagnosis not present

## 2020-01-02 DIAGNOSIS — E119 Type 2 diabetes mellitus without complications: Secondary | ICD-10-CM | POA: Diagnosis not present

## 2020-01-02 LAB — HM DIABETES EYE EXAM

## 2020-03-05 ENCOUNTER — Telehealth (INDEPENDENT_AMBULATORY_CARE_PROVIDER_SITE_OTHER): Payer: 59 | Admitting: Family Medicine

## 2020-03-05 ENCOUNTER — Other Ambulatory Visit: Payer: Self-pay

## 2020-03-05 DIAGNOSIS — I10 Essential (primary) hypertension: Secondary | ICD-10-CM | POA: Diagnosis not present

## 2020-03-05 DIAGNOSIS — J029 Acute pharyngitis, unspecified: Secondary | ICD-10-CM | POA: Diagnosis not present

## 2020-03-05 DIAGNOSIS — R519 Headache, unspecified: Secondary | ICD-10-CM

## 2020-03-05 NOTE — Assessment & Plan Note (Signed)
Unclear etiology. With pulsatile nature, unilateral, sensitivity to light and sound, somewhat consistent with migraine although radiation of pain is uncommon. May also have medication overuse contributing. Per chart review, has h/o daily headaches exacerbated by elevated blood pressure. Given reassuring exam at [facility] doubt intracranial etiology, sinus infection, otitis media, COVID. Advised patient to continue steroid course especially given some improvement noted and to follow up for in person exam early next week if not improved, at that time can do full HEENT and neuro exam, measure BP and obtain labs if needed. Patient verbalized understanding.

## 2020-03-05 NOTE — Progress Notes (Signed)
[place] Telemedicine Visit  Patient consented to have virtual visit. Method of visit: Video was attempted, but technology challenges prevented patient from using video, so visit was conducted via telephone.  Encounter participants: Patient: [name] - located at home Provider: Rory Percy - located at [facility] Others (if applicable): n/a  Chief Complaint: headache, pain in ear and throat  HPI:  Reports 3 day h/o L sided headache with radiation to ear and throat with occasional difficulties swallowing. Has h/o migraines in the past but not this bad and never radiated to throat. Pain was unbearable earlier today so presented to urgent care with reassuring exam and received prednisone 40mg  x5 days. Has taken first dose with some improvement.   States pain is pulsatile. She has tried ibuprofen, aleve without relief. Denies trauma, vision changes, N/V, fever, cough, runny nose, ear discharge, neck stiffness, difficulties walking or speaking. States pain is worse with turning head to the left but denies radiation down her arm. Is not on blood thinners. Some sensitivity to light and sound. Denies recent diet changes or caffeine withdrawal. Works at [employer] with coworker with sinus infection 2 weeks ago but denies any other known sick or [place] contacts. Patient had negative COVID test at urgent care today. Has h/o Hodgkin's lymphoma, currently in remission.   ROS: per HPI  Pertinent PMHx: HTN, T2DM, hypothyroidism, eczema, h/o Hodgkin's lymphoma, GAD, obesity, MDD  Exam:  Respiratory: speaks in full sentences, no respiratory distress  Assessment/Plan:  Headache Unclear etiology. With pulsatile nature, unilateral, sensitivity to light and sound, somewhat consistent with migraine although radiation of pain is uncommon. May also have medication overuse contributing. Per chart review, has h/o daily headaches exacerbated by elevated blood pressure. Given  reassuring exam at [facility] doubt intracranial etiology, sinus infection, otitis media, COVID. Advised patient to continue steroid course especially given some improvement noted and to follow up for in person exam early next week if not improved, at that time can do full HEENT and neuro exam, measure BP and obtain labs if needed. Patient verbalized understanding.    Time spent during visit with patient: 18 minutes

## 2020-03-09 ENCOUNTER — Other Ambulatory Visit: Payer: Self-pay

## 2020-03-09 ENCOUNTER — Encounter (HOSPITAL_COMMUNITY): Payer: Self-pay | Admitting: Emergency Medicine

## 2020-03-09 ENCOUNTER — Telehealth: Payer: Self-pay

## 2020-03-09 ENCOUNTER — Emergency Department (HOSPITAL_COMMUNITY)
Admission: EM | Admit: 2020-03-09 | Discharge: 2020-03-09 | Disposition: A | Discharge status: Home / Self Care | Payer: 59 | Attending: Emergency Medicine | Admitting: Emergency Medicine

## 2020-03-09 DIAGNOSIS — R739 Hyperglycemia, unspecified: Secondary | ICD-10-CM

## 2020-03-09 DIAGNOSIS — J45909 Unspecified asthma, uncomplicated: Secondary | ICD-10-CM | POA: Diagnosis not present

## 2020-03-09 DIAGNOSIS — E0965 Drug or chemical induced diabetes mellitus with hyperglycemia: Secondary | ICD-10-CM | POA: Diagnosis not present

## 2020-03-09 DIAGNOSIS — E1065 Type 1 diabetes mellitus with hyperglycemia: Secondary | ICD-10-CM | POA: Diagnosis not present

## 2020-03-09 DIAGNOSIS — Z87891 Personal history of nicotine dependence: Secondary | ICD-10-CM | POA: Insufficient documentation

## 2020-03-09 DIAGNOSIS — E1165 Type 2 diabetes mellitus with hyperglycemia: Secondary | ICD-10-CM

## 2020-03-09 DIAGNOSIS — T380X5A Adverse effect of glucocorticoids and synthetic analogues, initial encounter: Secondary | ICD-10-CM | POA: Diagnosis not present

## 2020-03-09 DIAGNOSIS — E039 Hypothyroidism, unspecified: Secondary | ICD-10-CM | POA: Insufficient documentation

## 2020-03-09 DIAGNOSIS — Z8571 Personal history of Hodgkin lymphoma: Secondary | ICD-10-CM | POA: Insufficient documentation

## 2020-03-09 DIAGNOSIS — Z794 Long term (current) use of insulin: Secondary | ICD-10-CM | POA: Diagnosis not present

## 2020-03-09 DIAGNOSIS — I10 Essential (primary) hypertension: Secondary | ICD-10-CM | POA: Insufficient documentation

## 2020-03-09 DIAGNOSIS — Z79899 Other long term (current) drug therapy: Secondary | ICD-10-CM | POA: Diagnosis not present

## 2020-03-09 LAB — CBC
HCT: 41 % (ref 36.0–46.0)
Hemoglobin: 14.1 g/dL (ref 12.0–15.0)
MCH: 31.1 pg (ref 26.0–34.0)
MCHC: 34.4 g/dL (ref 30.0–36.0)
MCV: 90.5 fL (ref 80.0–100.0)
Platelets: 245 10*3/uL (ref 150–400)
RBC: 4.53 MIL/uL (ref 3.87–5.11)
RDW: 11.8 % (ref 11.5–15.5)
WBC: 6 10*3/uL (ref 4.0–10.5)
nRBC: 0 % (ref 0.0–0.2)

## 2020-03-09 LAB — CBG MONITORING, ED
Glucose-Capillary: 398 mg/dL — ABNORMAL HIGH (ref 70–99)
Glucose-Capillary: 467 mg/dL — ABNORMAL HIGH (ref 70–99)
Glucose-Capillary: 350 mg/dL — ABNORMAL HIGH (ref 70–99)
Glucose-Capillary: 289 mg/dL — ABNORMAL HIGH (ref 70–99)

## 2020-03-09 LAB — BASIC METABOLIC PANEL
Anion gap: 8 (ref 5–15)
BUN: 22 mg/dL — ABNORMAL HIGH (ref 6–20)
CO2: 27 mmol/L (ref 22–32)
Calcium: 8.8 mg/dL — ABNORMAL LOW (ref 8.9–10.3)
Chloride: 97 mmol/L — ABNORMAL LOW (ref 98–111)
Creatinine, Ser: 1.15 mg/dL — ABNORMAL HIGH (ref 0.44–1.00)
GFR calc Af Amer: >60 mL/min (ref >60)
GFR calc non Af Amer: >60 mL/min (ref >60)
Glucose, Bld: 428 mg/dL — ABNORMAL HIGH (ref 70–99)
Potassium: 4.5 mmol/L (ref 3.5–5.1)
Sodium: 132 mmol/L — ABNORMAL LOW (ref 135–145)

## 2020-03-09 LAB — URINALYSIS, ROUTINE W REFLEX MICROSCOPIC
Bacteria, UA: NONE SEEN
Bilirubin Urine: NEGATIVE
Glucose, UA: >=500 mg/dL — AB
Hgb urine dipstick: NEGATIVE
Ketones, ur: NEGATIVE mg/dL
Leukocytes,Ua: NEGATIVE
Nitrite: NEGATIVE
Protein, ur: 30 mg/dL — AB
Specific Gravity, Urine: 1.031 — ABNORMAL HIGH (ref 1.005–1.030)
pH: 7 (ref 5.0–8.0)

## 2020-03-09 LAB — I-STAT BETA HCG BLOOD, ED (MC, WL, AP ONLY): I-stat hCG, quantitative: <5 m[IU]/mL (ref <5)

## 2020-03-09 MED ORDER — SODIUM CHLORIDE 0.9 % IV BOLUS: 1000.0000 mL | Freq: Once | INTRAVENOUS | Status: DC

## 2020-03-09 MED ORDER — INSULIN ASPART 100 UNIT/ML ~~LOC~~ SOLN
6.0000 [IU] | Freq: Once | SUBCUTANEOUS | Status: AC
  Administered 2020-03-09: 20:00:00 6 [IU] via INTRAVENOUS
  Filled 2020-03-09: qty 0.06

## 2020-03-09 MED ORDER — SODIUM CHLORIDE 0.9 % IV BOLUS
1000.0000 mL | Freq: Once | INTRAVENOUS | Status: AC
  Administered 2020-03-09: 19:00:00 1000 mL via INTRAVENOUS

## 2020-03-09 MED ORDER — SODIUM CHLORIDE 0.9 % IV BOLUS
500.0000 mL | Freq: Once | INTRAVENOUS | Status: AC
  Administered 2020-03-09: 500 mL via INTRAVENOUS

## 2020-03-09 NOTE — Telephone Encounter (Signed)
Patient calls nurse line to report high CBGs. Patient stated last night her sugar was 588, the patient stated she was able to get them down to the upper 400s, and finally 00 this am. Patient stated she has been drinking plenty of water and taking medications appropriately, patients denies an worrisome symptoms. I scheduled her apt with ATC tomorrow. ED precautions given, especially if her sugar spikes back toward 600.

## 2020-03-09 NOTE — ED Triage Notes (Signed)
Pt reports was sick last week and started on Prednisone on Thursday. Reports since last week her blood sugars have been up and down.

## 2020-03-09 NOTE — ED Provider Notes (Signed)
[facility] DEPT Provider Note   CSN: [id number] Arrival date & time: 03/09/20  1227     History Chief Complaint  Patient presents with  . Hyperglycemia    [name] is a 39 y.o. female with a history of T2DM, Hodgkin lymphoma, hypothyroidism, hypertension, migraines, and anxiety who presents to the emergency department with complaints of hyperglycemia and generally not feeling well for the past 5 days.  Patient states she was seen in urgent care facility 04/08 for evaluation of a sore throat, she states that they placed her on prednisone 2 tablets daily for 5 days.  She has been taking this medication as prescribed by taking her last dose this morning.  She states since starting the medication her sore throat has resolved,, however she has developed several other symptoms.  She states that she feels very fatigued, nauseated, and has had polyuria.  She states her blood sugars are running high up into the 500s at times.  She has generally been eating well.  No alleviating or aggravating factors.  Denies fever, chills, emesis, chest pain, dyspnea, abdominal pain, or dysuria.  She does not utilize insulin at home.  She is been taking her diabetes medications as prescribed.  HPI     Past Medical History:  Diagnosis Date  . Asthma    seasonal, worse in spring. Uses inhaler during this time of year  . Benign essential HTN 01/19/2012  . Complication of anesthesia SLOW TO WAKE UP AFTER C SECTION  . Cough, persistent 07/27/2012  . DM II (diabetes mellitus, type II), controlled ([place]) 01/19/2012  . Eczema 01/19/2012  . History of Hodgkin's lymphoma 07/27/2012   Stage IIB dx 2/13 Rx ABVD X 4 then involved field Radiation   . Hodgkin lymphoma ([place]) 01/18/2012   left axilla & left neck dx'd 01/16/2012  . Hypertension   . Hypothyroidism    06/2014  . Leukopenia 08/01/2012  . Migraine   . Muscle spasm of back 03/09/2012  . S/P chemotherapy, time since 4-12 weeks  05/11/2012   4 cycles of ABVD with neulasta support and zoladex    Patient Active Problem List   Diagnosis Date Noted  . Diarrhea in adult patient 12/03/2019  . Major depressive disorder, recurrent episode, moderate ([place]) 12/29/2016  . IUD (intrauterine device) in place 12/27/2016  . Controlled type 2 diabetes mellitus without complication, without long-term current use of insulin ([place]) 07/17/2015  . Preventive measure 08/07/2014  . Obesity, Class II, BMI 35-39.9, with comorbidity ([place]) 07/09/2014  . Proteinuria 01/10/2014  . Other specified hypothyroidism 01/10/2014  . Murmur 10/17/2013  . Sleep disorder 10/11/2013  . Headache 09/16/2013  . Generalized anxiety disorder 09/16/2013  . Leukopenia 08/01/2012  . History of Hodgkin's lymphoma 07/27/2012  . Hypertension associated with diabetes ([place]) 01/19/2012  . Eczema 01/19/2012    Past Surgical History:  Procedure Laterality Date  . axillary lymph node biopsy  12/2011   left  . CESAREAN SECTION  2008  . DILATION AND CURETTAGE OF UTERUS     Early 2000s  . KNEE SURGERY Right 11/02/2016   [place]  . PORTACATH PLACEMENT  01/25/2012   Procedure: INSERTION PORT-A-CATH;  Surgeon: Gayland Curry, MD,FACS;  Location: [facility];  Service: General;  Laterality: Right;  right subclavian     OB History    Gravida  3   Para  1   Term  1   Preterm      AB  2  Living  1     SAB  2   TAB  0   Ectopic  0   Multiple  0   Live Births              Family History  Problem Relation Age of Onset  . Allergies Mother   . Ovarian cancer Mother 10       Also diagnosed with some blood cancer  . Diabetes Maternal Grandmother   . Heart disease Maternal Grandfather     Social History   Tobacco Use  . Smoking status: Former Smoker    Packs/day: 0.25    Years: 5.00    Pack years: 1.25    Types: Cigarettes    Quit date: 11/28/2002    Years since quitting: 17.2  . Smokeless tobacco: Never Used  . Tobacco comment: Quit  ?2004-2005  Substance Use Topics  . Alcohol use: No    Alcohol/week: 0.0 standard drinks  . Drug use: No    Home Medications Prior to Admission medications   Medication Sig Start Date End Date Taking? Authorizing Provider  amLODipine (NORVASC) 10 MG tablet Take 1 tablet (10 mg total) by mouth at bedtime. 07/11/19   Kathrene Alu, MD  busPIRone (BUSPAR) 7.5 MG tablet Take 1 tablet (7.5 mg total) by mouth 2 (two) times daily. 11/12/19   Kathrene Alu, MD  cyclobenzaprine (FLEXERIL) 10 MG tablet Take 1 tablet (10 mg total) by mouth 2 (two) times daily as needed for muscle spasms. 02/09/18   Alveda Reasons, MD  FLUoxetine (PROZAC) 20 MG tablet Take 2 tablets (40 mg total) by mouth daily. 08/13/19   Kathrene Alu, MD  gabapentin (NEURONTIN) 100 MG capsule Take 1-3 capsules (100-300 mg total) by mouth at bedtime. 07/12/19   Winfrey, Alcario Drought, MD  glipiZIDE (GLUCOTROL) 10 MG tablet TAKE 1 TAB DAILY X 3 DAYS, THEN 1 TAB IN THE AM AND 1 TAB IN PM DAILY 08/06/19   Winfrey, Alcario Drought, MD  glucose blood (ACCU-CHEK AVIVA) test strip Use as instructed Patient not taking: Reported on 08/01/2019 07/27/15   Zenia Resides, MD  ibuprofen (ADVIL,MOTRIN) 800 MG tablet Take 1 tablet (800 mg total) by mouth 3 (three) times daily. 08/27/16   Domenic Moras, PA-C  Insulin Pen Needle 32G X 4 MM MISC 1 Container by Does not apply route as needed. 07/11/19   Kathrene Alu, MD  Lancets (ACCU-CHEK SOFT TOUCH) lancets Use as instructed 07/11/19   Kathrene Alu, MD  levothyroxine (SYNTHROID) 100 MCG tablet Take 1 tablet (100 mcg total) by mouth daily. 05/16/19   Everrett Coombe, MD  losartan (COZAAR) 25 MG tablet Take 1 tablet (25 mg total) by mouth at bedtime. 07/12/19   Kathrene Alu, MD  metformin (FORTAMET) 500 MG (OSM) 24 hr tablet Take 1 tablet (500 mg total) by mouth 2 (two) times daily with a meal. 11/12/19   Winfrey, Alcario Drought, MD  methocarbamol (ROBAXIN) 500 MG tablet as needed.  11/16/16   [provider]  omeprazole (PRILOSEC) 20 MG capsule Take 1 capsule (20 mg total) by mouth daily. 12/03/19   Gifford Shave, MD  ondansetron (ZOFRAN ODT) 4 MG disintegrating tablet Take 1 tablet (4 mg total) by mouth every 8 (eight) hours as needed for nausea or vomiting. 12/03/19   Gifford Shave, MD  Probiotic Product (PROBIOTIC DAILY) CAPS Take 1 capsule by mouth daily. Reported on 04/28/2016    [provider]  Semaglutide,0.25 or  0.5MG /DOS, (OZEMPIC, 0.25 OR 0.5 MG/DOSE,) 2 MG/1.5ML SOPN Inject 0.5 mg into the skin once a week. Start with 0.25 mg once a week for 4 weeks, then increase to 0.5 mg once a week. 11/12/19   Kathrene Alu, MD  triamcinolone cream (KENALOG) 0.1 % Apply 1 application topically 2 (two) times daily. 08/13/19   Kathrene Alu, MD    Allergies    Imitrex [sumatriptan], Metformin and related, and Penicillins  Review of Systems   Review of Systems  Constitutional: Positive for fatigue. Negative for chills and fever.  HENT: Positive for sore throat (Resolved at present).   Respiratory: Negative for shortness of breath.   Cardiovascular: Negative for chest pain.  Gastrointestinal: Positive for nausea. Negative for abdominal pain, blood in stool, constipation, diarrhea and vomiting.  Endocrine: Positive for polyuria.  Genitourinary: Negative for dysuria.  Neurological: Negative for syncope.  All other systems reviewed and are negative.   Physical Exam Updated Vital Signs BP (!) 142/103 (BP Location: Left Arm)   Pulse 90   Temp 98.5 F (36.9 C)   Resp 20   SpO2 100%   Physical Exam Vitals and nursing note reviewed.  Constitutional:      General: She is not in acute distress.    Appearance: She is well-developed. She is not toxic-appearing.  HENT:     Head: Normocephalic and atraumatic.     Mouth/Throat:     Mouth: Mucous membranes are dry.     Pharynx: Oropharynx is clear. Uvula midline. No oropharyngeal exudate.     Comments: Posterior  oropharynx is symmetric appearing. Patient tolerating own secretions without difficulty. No trismus. No drooling. No hot potato voice. No swelling beneath the tongue, submandibular compartment is soft.   Eyes:     General:        Right eye: No discharge.        Left eye: No discharge.     Conjunctiva/sclera: Conjunctivae normal.  Cardiovascular:     Rate and Rhythm: Normal rate and regular rhythm.  Pulmonary:     Effort: Pulmonary effort is normal. No respiratory distress.     Breath sounds: Normal breath sounds. No wheezing, rhonchi or rales.  Abdominal:     General: There is no distension.     Palpations: Abdomen is soft.     Tenderness: There is no abdominal tenderness. There is no guarding or rebound.  Musculoskeletal:     Cervical back: Normal range of motion and neck supple. No rigidity.  Lymphadenopathy:     Cervical: No cervical adenopathy.  Skin:    General: Skin is warm and dry.     Findings: No rash.  Neurological:     Mental Status: She is alert.     Comments: Clear speech.   Psychiatric:        Behavior: Behavior normal.    ED Results / Procedures / Treatments   Labs (all labs ordered are listed, but only abnormal results are displayed) Labs Reviewed  BASIC METABOLIC PANEL - Abnormal; Notable for the following components:      Result Value   Sodium 132 (*)    Chloride 97 (*)    Glucose, Bld 428 (*)    BUN 22 (*)    Creatinine, Ser 1.15 (*)    Calcium 8.8 (*)    All other components within normal limits  URINALYSIS, ROUTINE W REFLEX MICROSCOPIC - Abnormal; Notable for the following components:   Specific Gravity, Urine 1.031 (*)  Glucose, UA >=500 (*)    Protein, ur 30 (*)    All other components within normal limits  CBG MONITORING, ED - Abnormal; Notable for the following components:   Glucose-Capillary 398 (*)    All other components within normal limits  CBG MONITORING, ED - Abnormal; Notable for the following components:   Glucose-Capillary 467  (*)    All other components within normal limits  CBG MONITORING, ED - Abnormal; Notable for the following components:   Glucose-Capillary 350 (*)    All other components within normal limits  CBG MONITORING, ED - Abnormal; Notable for the following components:   Glucose-Capillary 289 (*)    All other components within normal limits  CBC  I-STAT BETA HCG BLOOD, ED ([facility], [facility], [facility] ONLY)    EKG None  Radiology No results found.  Procedures Procedures (including critical care time)  Medications Ordered in ED Medications  sodium chloride 0.9 % bolus 1,000 mL (0 mLs Intravenous Stopped 03/09/20 2142)  sodium chloride 0.9 % bolus 500 mL (0 mLs Intravenous Stopped 03/09/20 2143)  insulin aspart (novoLOG) injection 6 Units (6 Units Intravenous Given 03/09/20 1939)    ED Course  I have reviewed the triage vital signs and the nursing notes.  Pertinent labs & imaging results that were available during my care of the patient were reviewed by me and considered in my medical decision making (see chart for details).    MDM Rules/Calculators/A&P                      Patient with history of T2DM presents to the emergency department with complaints of hyperglycemia with generally not feeling well.  She is nontoxic, resting comfortably, vitals WNL with exception of elevated blood pressure, doubt HTN emergency.  On exam her mucous membranes are dry, otherwise benign.  DDx: DKA, HHS, hyperglycemia, viral illness, anemia, electrolyte derangement, UTI.   Additional history from chart review and nursing note reviewed.  Work-up per triage has been reviewed and interpreted: CBC: No anemia or leukocytosis BMP: Hyperglycemia without acidosis or anion gap elevation.  Creatinine mildly increased from prior 1.15 today most recently 0.83.  Mild electrolyte abnormalities, no significant derangement. Pregnancy test: Negative Urinalysis: Beta specific gravity glucosuria, concern for dehydration, no UTI.  No  ketonuria.  Presentation overall is consistent with hyperglycemia without findings of DKA or HHS.  Suspect this was steroid-induced.  She appears mildly dehydrated as well.  Will give 1.5 L of fluid and 6 units of insulin.   Repeat CBG improved, patient feeling better, appears appropriate for discharge home. Steroids were completed today therefore hopefully will not have re-occurrence of hyperglycemia, recommended she continue her at home diabetes medications, continue monitoring CBG, follow up with PCP. I discussed results, treatment plan, need for follow-up, and return precautions with the patient. Provided opportunity for questions, patient confirmed understanding and is in agreement with plan.    Final Clinical Impression(s) / ED Diagnoses Final diagnoses:  Steroid-induced hyperglycemia  Type 2 diabetes mellitus with hyperglycemia, without long-term current use of insulin [facility])    Rx / DC Orders ED Discharge Orders    None       Amaryllis Dyke, PA-C 03/09/20 2346    Charlesetta Shanks, MD 03/16/20 [phone]

## 2020-03-09 NOTE — Discharge Instructions (Addendum)
You were seen in the emergency department today for high blood sugars and generally not feeling well.  Your labs showed that your blood sugar was elevated in the 400 range and that you were dehydrated with mildly elevated kidney function.   We gave you fluids and insulin with some improvement in your blood sugar.   We suspect the elevated blood sugars were due to your steroid use. Please continue your diabetes medications as prescribed. Try to stay well hydrated. Continue to monitor your blood sugars at home.  Follow up with your primary care provider within 3 days for re-evaluation and recheck of your labs. Return to the Er for new or worsening symptoms including but not limited to uncontrolled blood sugars, vomiting, fever, abdominal pain, chest pain, trouble breathing, passing out, increased thirst/urination, or any other concerns.

## 2020-03-09 NOTE — ED Notes (Signed)
Verified with Pfeiffer MD to give 6 units of IV insulin with the change of blood sugar to 350.

## 2020-03-10 ENCOUNTER — Ambulatory Visit: Payer: 59

## 2020-03-11 ENCOUNTER — Ambulatory Visit: Payer: 59

## 2020-03-18 ENCOUNTER — Ambulatory Visit: Payer: 59 | Admitting: Family Medicine

## 2020-04-02 ENCOUNTER — Telehealth: Payer: Self-pay | Admitting: Adult Health

## 2020-04-02 ENCOUNTER — Inpatient Hospital Stay: Payer: 59 | Attending: Adult Health | Admitting: Adult Health

## 2020-04-02 ENCOUNTER — Encounter: Payer: Self-pay | Admitting: Adult Health

## 2020-04-02 ENCOUNTER — Other Ambulatory Visit: Payer: Self-pay

## 2020-04-02 VITALS — BP 160/107 | HR 87 | Temp 98.7°F | Resp 18 | Ht 68.0 in | Wt 228.1 lb

## 2020-04-02 DIAGNOSIS — Z8572 Personal history of non-Hodgkin lymphomas: Secondary | ICD-10-CM | POA: Insufficient documentation

## 2020-04-02 DIAGNOSIS — Z87891 Personal history of nicotine dependence: Secondary | ICD-10-CM | POA: Diagnosis not present

## 2020-04-02 DIAGNOSIS — Z808 Family history of malignant neoplasm of other organs or systems: Secondary | ICD-10-CM | POA: Insufficient documentation

## 2020-04-02 DIAGNOSIS — E1165 Type 2 diabetes mellitus with hyperglycemia: Secondary | ICD-10-CM | POA: Insufficient documentation

## 2020-04-02 DIAGNOSIS — Z794 Long term (current) use of insulin: Secondary | ICD-10-CM | POA: Insufficient documentation

## 2020-04-02 DIAGNOSIS — Z8571 Personal history of Hodgkin lymphoma: Secondary | ICD-10-CM | POA: Diagnosis not present

## 2020-04-02 DIAGNOSIS — J45909 Unspecified asthma, uncomplicated: Secondary | ICD-10-CM | POA: Diagnosis not present

## 2020-04-02 DIAGNOSIS — Z8041 Family history of malignant neoplasm of ovary: Secondary | ICD-10-CM | POA: Insufficient documentation

## 2020-04-02 DIAGNOSIS — E119 Type 2 diabetes mellitus without complications: Secondary | ICD-10-CM | POA: Diagnosis not present

## 2020-04-02 DIAGNOSIS — Z79899 Other long term (current) drug therapy: Secondary | ICD-10-CM | POA: Diagnosis not present

## 2020-04-02 DIAGNOSIS — I10 Essential (primary) hypertension: Secondary | ICD-10-CM | POA: Insufficient documentation

## 2020-04-02 DIAGNOSIS — Z923 Personal history of irradiation: Secondary | ICD-10-CM | POA: Diagnosis not present

## 2020-04-02 DIAGNOSIS — E039 Hypothyroidism, unspecified: Secondary | ICD-10-CM | POA: Insufficient documentation

## 2020-04-02 DIAGNOSIS — Z9221 Personal history of antineoplastic chemotherapy: Secondary | ICD-10-CM | POA: Diagnosis not present

## 2020-04-02 NOTE — Progress Notes (Signed)
CLINIC:  Survivorship   REASON FOR VISIT:  Routine follow-up for history of stage II Hodgkins Lymphoma.   BRIEF ONCOLOGIC HISTORY:  Oncology History Overview Note  Hodgkin's lymphoma   Primary site: Lymphoid Neoplasms (Left)   Staging method: AJCC 6th Edition   Clinical: Stage II   Summary: Stage II     History of Hodgkin's lymphoma  01/11/2012 Procedure   [id number] Left axillary LN biopsy showed Hodgkin lymphoma   01/24/2012 Imaging   Ct scan of chest, abdomen and pelvis & PET scan showed bulky left axillary LN, supraclavicular lymphadenoapthy and medistinal involvement   02/03/2012 - 05/11/2012 Chemotherapy   She completed 4 cycles of ABVD   05/28/2012 Imaging   PET scan showed partial response   06/18/2012 - 07/16/2012 Radiation Therapy   She completed radiation treatment   07/27/2012 Imaging   CT scan of chest showed pneumonia   03/28/2013 Imaging   Ct chest showed no recurrence   04/15/2013 Imaging   Ct angiogram showed pneumonia   06/04/2013 Imaging   PET scan showed complete response   08/06/2013 Imaging   Ct scan showed no recurrence   02/04/2014 Imaging   Ct chest showed no recurrence   07/31/2014 Imaging   Ct chest showed no recurrence     INTERVAL HISTORY:  Ms. Aslan presents to the [facility] today for routine follow-up for her history of lymphoma.  Stassi has had a rough couple of months.  She notes that she felt like her lymph nodes were swollen and she had a headache and difficulty turning her head to the left.  This reminded her of when she presented initially with lymphoma.  She went to urgent care and was treated with Prednisone.  The prednisone greatly increased her blood sugars, and she required hospitalization for DKA.  She is recovering, but notes her sugars aren't where they are supposed to be.   She notes that she is still seeing her PCP regularly and is also continuing to undergo annual breast screening mammograms due to her increased  risk secondary to her lymphoma treatment.      REVIEW OF SYSTEMS:   Review of Systems  Constitutional: Positive for fatigue (due to stress and recent hospitalization). Negative for appetite change, chills, fever and unexpected weight change.  HENT:   Negative for hearing loss and lump/mass.   Eyes: Negative for eye problems and icterus.  Respiratory: Negative for chest tightness, cough and shortness of breath.   Cardiovascular: Negative for chest pain, leg swelling and palpitations.  Gastrointestinal: Negative for abdominal distention, abdominal pain, constipation, diarrhea, nausea and vomiting.  Endocrine: Negative for hot flashes.  Musculoskeletal: Negative for arthralgias.  Skin: Negative for itching and rash.  Neurological: Negative for dizziness, extremity weakness, headaches and numbness.  Hematological: Negative for adenopathy. Does not bruise/bleed easily.  Psychiatric/Behavioral: Negative for depression. The patient is not nervous/anxious.     PAST MEDICAL/SURGICAL HISTORY:  Past Medical History:  Diagnosis Date  . Asthma    seasonal, worse in spring. Uses inhaler during this time of year  . Benign essential HTN 01/19/2012  . Complication of anesthesia SLOW TO WAKE UP AFTER C SECTION  . Cough, persistent 07/27/2012  . DM II (diabetes mellitus, type II), controlled ([place]) 01/19/2012  . Eczema 01/19/2012  . History of Hodgkin's lymphoma 07/27/2012   Stage IIB dx 2/13 Rx ABVD X 4 then involved field Radiation   . Hodgkin lymphoma ([place]) 01/18/2012   left axilla & left neck dx'd 01/16/2012  .  Hypertension   . Hypothyroidism    06/2014  . Leukopenia 08/01/2012  . Migraine   . Muscle spasm of back 03/09/2012  . S/P chemotherapy, time since 4-12 weeks 05/11/2012   4 cycles of ABVD with neulasta support and zoladex   Past Surgical History:  Procedure Laterality Date  . axillary lymph node biopsy  12/2011   left  . CESAREAN SECTION  2008  . DILATION AND CURETTAGE OF UTERUS      Early 2000s  . KNEE SURGERY Right 11/02/2016   Torn Maniscus  . PORTACATH PLACEMENT  01/25/2012   Procedure: INSERTION PORT-A-CATH;  Surgeon: Gayland Curry, MD,FACS;  Location: [facility];  Service: General;  Laterality: Right;  right subclavian     ALLERGIES:  Allergies  Allergen Reactions  . Imitrex [Sumatriptan] Nausea Only  . Penicillins Rash     CURRENT MEDICATIONS:  Outpatient Encounter Medications as of 04/02/2020  Medication Sig Note  . acetaminophen (TYLENOL) 500 MG tablet Take 500 mg by mouth every 6 (six) hours as needed for mild pain or headache.   Marland Kitchen amLODipine (NORVASC) 10 MG tablet Take 1 tablet (10 mg total) by mouth at bedtime.   . cyclobenzaprine (FLEXERIL) 10 MG tablet Take 1 tablet (10 mg total) by mouth 2 (two) times daily as needed for muscle spasms.   Marland Kitchen gabapentin (NEURONTIN) 100 MG capsule Take 1-3 capsules (100-300 mg total) by mouth at bedtime. (Patient taking differently: Take 100-300 mg by mouth at bedtime as needed (sleep). )   . glipiZIDE (GLUCOTROL) 10 MG tablet TAKE 1 TAB DAILY X 3 DAYS, THEN 1 TAB IN THE AM AND 1 TAB IN PM DAILY (Patient taking differently: Take 10 mg by mouth daily before breakfast. )   . glucose blood (ACCU-CHEK AVIVA) test strip Use as instructed   . ibuprofen (ADVIL,MOTRIN) 800 MG tablet Take 1 tablet (800 mg total) by mouth 3 (three) times daily. (Patient taking differently: Take 800 mg by mouth every 8 (eight) hours as needed for fever or moderate pain. )   . Insulin Pen Needle 32G X 4 MM MISC 1 Container by Does not apply route as needed.   . Lancets (ACCU-CHEK SOFT TOUCH) lancets Use as instructed   . levothyroxine (SYNTHROID) 100 MCG tablet Take 1 tablet (100 mcg total) by mouth daily.   Marland Kitchen losartan (COZAAR) 25 MG tablet Take 1 tablet (25 mg total) by mouth at bedtime.   . metFORMIN (GLUCOPHAGE-XR) 500 MG 24 hr tablet Take 500 mg by mouth daily.   . ondansetron (ZOFRAN ODT) 4 MG disintegrating tablet Take 1 tablet (4 mg total) by mouth  every 8 (eight) hours as needed for nausea or vomiting.   . Probiotic Product (PROBIOTIC DAILY) CAPS Take 1 capsule by mouth daily. Reported on 04/28/2016   . Semaglutide,0.25 or 0.5MG /DOS, (OZEMPIC, 0.25 OR 0.5 MG/DOSE,) 2 MG/1.5ML SOPN Inject 0.5 mg into the skin once a week. Start with 0.25 mg once a week for 4 weeks, then increase to 0.5 mg once a week. (Patient taking differently: Inject 0.25 mg into the skin once a week. ) 03/09/2020: Sundays  . triamcinolone cream (KENALOG) 0.1 % Apply 1 application topically 2 (two) times daily. (Patient taking differently: Apply 1 application topically 2 (two) times daily as needed (rash). )   . busPIRone (BUSPAR) 7.5 MG tablet Take 1 tablet (7.5 mg total) by mouth 2 (two) times daily. (Patient not taking: Reported on 04/02/2020)   . FLUoxetine (PROZAC) 20 MG tablet Take 2  tablets (40 mg total) by mouth daily. (Patient not taking: Reported on 04/02/2020)   . omeprazole (PRILOSEC) 20 MG capsule Take 1 capsule (20 mg total) by mouth daily. (Patient not taking: Reported on 04/02/2020)   . Triprolidine-Pseudoephedrine (COLD/ALLERGY/SINUS PO) Take 5 mg by mouth as needed (sinus infection).   . [DISCONTINUED] predniSONE (DELTASONE) 20 MG tablet Take 20 mg by mouth 2 (two) times daily. 03/09/2020: Ends 4.12.21   No facility-administered encounter medications on file as of 04/02/2020.     ONCOLOGIC FAMILY HISTORY:  Family History  Problem Relation Age of Onset  . Allergies Mother   . Ovarian cancer Mother 27       Also diagnosed with some blood cancer  . Diabetes Maternal Grandmother   . Heart disease Maternal Grandfather       SOCIAL HISTORY:  Social History   Socioeconomic History  . Marital status: Married    Spouse name: Not on file  . Number of children: Not on file  . Years of education: Not on file  . Highest education level: Not on file  Occupational History  . Not on file  Tobacco Use  . Smoking status: Former Smoker    Packs/day: 0.25    Years:  5.00    Pack years: 1.25    Types: Cigarettes    Quit date: 11/28/2002    Years since quitting: 17.3  . Smokeless tobacco: Never Used  . Tobacco comment: Quit ?2004-2005  Substance and Sexual Activity  . Alcohol use: No    Alcohol/week: 0.0 standard drinks  . Drug use: No  . Sexual activity: Yes    Partners: Male    Birth control/protection: I.U.D.  Other Topics Concern  . 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 Difficulty of Paying Living Expenses:   Food Insecurity:   . Worried About Charity fundraiser in the Last Year:   . Arboriculturist in the Last Year:   Transportation Needs:   . Film/video editor (Medical):   Marland Kitchen Lack of Transportation (Non-Medical):   Physical Activity:   . Days of Exercise per Week:   . Minutes of Exercise per Session:   Stress:   . Feeling of Stress :   Social Connections:   . Frequency of Communication with Friends and Family:   . Frequency of Social Gatherings with Friends and Family:   . Attends Religious Services:   . Active Member of Clubs or Organizations:   . Attends Archivist Meetings:   Marland Kitchen Marital Status:   Intimate Partner Violence:   . Fear of Current or Ex-Partner:   . Emotionally Abused:   Marland Kitchen Physically Abused:   . Sexually Abused:         PHYSICAL EXAMINATION:  Vital Signs: Vitals:   04/02/20 1313  BP: (!) 160/107  Pulse: 87  Resp: 18  Temp: 98.7 F (37.1 C)  SpO2: 100%   Filed Weights   04/02/20 1313  Weight: 228 lb 1.6 oz (103.5 kg)   General: Well-nourished, well-appearing woman in no acute distress. Unaccompanied today. HEENT: Head is normocephalic.  Pupils equal and reactive to light. Conjunctivae clear without exudate.  Sclerae anicteric. Oral mucosa is pink, moist.  Oropharynx is pink without lesions or erythema.  Lymph: No cervical,  supraclavicular, infraclavicular,  or axillary lymphadenopathy noted on palpation.  Cardiovascular: Regular rate and rhythm.Marland Kitchen Respiratory: Clear to auscultation bilaterally. Chest expansion symmetric; breathing non-labored.  Breasts: bilateral breasts without nodules, masses, skin or nipple changes. GI: Abdomen soft and round; non-tender, non-distended. Bowel sounds normoactive. No hepatosplenomegaly.   GU: Deferred.  Neuro: No focal deficits. Steady gait.  Psych: Mood and affect normal and appropriate for situation.  Extremities: No edema. Skin: Warm and dry.   LABORATORY DATA:  No visits with results within 1 Day(s) from this visit.  Latest known visit with results is:  Admission on 03/09/2020, Discharged on 03/09/2020  Component Date Value Ref Range Status  . Sodium 03/09/2020 132* 135 - 145 mmol/L Final  . Potassium 03/09/2020 4.5  3.5 - 5.1 mmol/L Final  . Chloride 03/09/2020 97* 98 - 111 mmol/L Final  . CO2 03/09/2020 27  22 - 32 mmol/L Final  . Glucose, Bld 03/09/2020 428* 70 - 99 mg/dL Final   Glucose reference range applies only to samples taken after fasting for at least 8 hours.  . BUN 03/09/2020 22* 6 - 20 mg/dL Final  . Creatinine, Ser 03/09/2020 1.15* 0.44 - 1.00 mg/dL Final  . Calcium 03/09/2020 8.8* 8.9 - 10.3 mg/dL Final  . GFR calc non Af Amer 03/09/2020 >60  >60 mL/min Final  . GFR calc Af Amer 03/09/2020 >60  >60 mL/min Final  . Anion gap 03/09/2020 8  5 - 15 Final   Performed at [facility] [place] [address], [place], [place] [address]  . WBC 03/09/2020 6.0  4.0 - 10.5 K/uL Final  . RBC 03/09/2020 4.53  3.87 - 5.11 MIL/uL Final  . Hemoglobin 03/09/2020 14.1  12.0 - 15.0 g/dL Final  . HCT 03/09/2020 41.0  36.0 - 46.0 % Final  . MCV 03/09/2020 90.5  80.0 - 100.0 fL Final  . MCH 03/09/2020 31.1  26.0 - 34.0 pg Final  . MCHC 03/09/2020 34.4  30.0 - 36.0 g/dL Final  . RDW 03/09/2020 11.8  11.5 - 15.5 % Final  . Platelets 03/09/2020 245  150 - 400  K/uL Final  . nRBC 03/09/2020 0.0  0.0 - 0.2 % Final   Performed at [facility], [place] [address] [place], [place] [address]  . Color, Urine 03/09/2020 YELLOW  YELLOW Final  . APPearance 03/09/2020 CLEAR  CLEAR Final  . Specific Gravity, Urine 03/09/2020 1.031* 1.005 - 1.030 Final  . pH 03/09/2020 7.0  5.0 - 8.0 Final  . Glucose, UA 03/09/2020 >=500* NEGATIVE mg/dL Final  . Hgb urine dipstick 03/09/2020 NEGATIVE  NEGATIVE Final  . Bilirubin Urine 03/09/2020 NEGATIVE  NEGATIVE Final  . Ketones, ur 03/09/2020 NEGATIVE  NEGATIVE mg/dL Final  . Protein, ur 03/09/2020 30* NEGATIVE mg/dL Final  . Nitrite 03/09/2020 NEGATIVE  NEGATIVE Final  . Chalmers Guest 03/09/2020 NEGATIVE  NEGATIVE Final  . RBC / HPF 03/09/2020 0-5  0 - 5 RBC/hpf Final  . Bacteria, UA 03/09/2020 NONE SEEN  NONE SEEN Final  . Squamous Epithelial / LPF 03/09/2020 0-5  0 - 5 Final   Performed at [facility], [place] [address], [place], [place] [address]  . Glucose-Capillary 03/09/2020 398* 70 - 99 mg/dL Final   Glucose reference range applies only to samples taken after fasting for at least 8 hours.  . I-stat hCG, quantitative 03/09/2020 <5.0  <5 mIU/mL Final  . Comment 3 03/09/2020          Final   Comment:   GEST.  AGE      CONC.  (mIU/mL)   <=1 WEEK        5 - 50     2 WEEKS       50 - 500     3 WEEKS       100 - 10,000     4 WEEKS     1,000 - 30,000        FEMALE AND NON-PREGNANT FEMALE:     LESS THAN 5 mIU/mL   . Glucose-Capillary 03/09/2020 467* 70 - 99 mg/dL Final   Glucose reference range applies only to samples taken after fasting for at least 8 hours.  . Glucose-Capillary 03/09/2020 350* 70 - 99 mg/dL Final   Glucose reference range applies only to samples taken after fasting for at least 8 hours.  . Glucose-Capillary 03/09/2020 289* 70 - 99 mg/dL Final   Glucose reference range applies only to samples taken after fasting for at least 8 hours.    DIAGNOSTIC IMAGING:  None  for this visit  Hodgkin Lymphoma Recommendations:      ASSESSMENT AND PLAN:  Ms.. Kreisher is a pleasant 39 y.o. woman with h/o Stage II Hodgkin Lymphoma, diagnosed in 2013; treated with chemotherapy and radiation. Ms.Degan presents to the Survivorship Clinic for surveillance and routine follow-up.   1. History of Hodgkin Lymphoma:  Ms. Domenick is currently clinically and radiographically without evidence of disease or recurrence of lymphoma. Due to her episode of pain, and lymph node swelling, it is perplexing and we will get a CT neck, chest, abdomen, and pelvis to fully evaluate.  She will return in 2 weeks for labs and in one month for scans. She will follow-up in the Survivorship Clinic in 1 year with labs, history, and physical exam per surveillance protocol.  I encouraged her to call me with any questions or concerns before her next visit at the cancer center, and I would be happy to see the patient sooner, if needed.    2. Uncontrolled diabetes: I counseled patient on the importance of adherence to her medication management of diabetes, and recommendations by her PCP. I cautioned her regarding the complication of organ damage related to long term uncontrolled diabetes, and that if her cancer were ever to recur, her uncontrolled diabetes would make treatment difficult.  She knows that we really need to get her diabetes improved in the next couple of weeks so her kidney function will improve so that she will be able to receive contrast with her scans.  3. Cancer screening:  Due to Ms. Ousley's history and age, she should receive screening for skin cancers, gynecologic cancer.  Breast cancer screening to start at 2021 and continue annually since she did undergo chest radiation. The patient was encouraged to follow-up with her PCP for appropriate cancer screenings.   4. Health maintenance and wellness promotion: Ms. Demayo was encouraged to consume 5-7 servings of fruits and  vegetables per day. The patient was also encouraged to engage in moderate to vigorous exercise for 30 minutes per day most days of the week. Ms. Grenda was instructed to limit her alcohol consumption and continue to abstain from tobacco use.  Other recommendations for Hodgkins survivors are noted above.      Dispo:  -Return to cancer center to see Survivorship NP  -Annual mamograms -CT scans -Consider Carotid ultrasounds/echo in the next 1-3 years.   Total encounter time: 30 minutes*    Wilber Bihari, NP Survivorship Program [facility]  [id number]  *Total Encounter Time as defined by the Centers for Medicare and Medicaid Services includes, in addition to the face-to-face time of a patient visit (documented in the note above) non-face-to-face time: obtaining and reviewing outside history, ordering and reviewing medications, tests or procedures, care coordination (communications with other health care professionals or caregivers) and documentation in the medical record.    Note: PRIMARY CARE PROVIDER Kathrene Alu, [place] [phone]

## 2020-04-02 NOTE — Telephone Encounter (Signed)
Scheduled appt per 5/6 los. Pt confirmed appt date and time.

## 2020-04-03 ENCOUNTER — Encounter: Payer: Self-pay | Admitting: Adult Health

## 2020-04-08 ENCOUNTER — Other Ambulatory Visit: Payer: Self-pay

## 2020-04-08 ENCOUNTER — Ambulatory Visit (INDEPENDENT_AMBULATORY_CARE_PROVIDER_SITE_OTHER): Payer: 59 | Admitting: Family Medicine

## 2020-04-08 VITALS — BP 135/80 | HR 83 | Ht 68.0 in | Wt 230.0 lb

## 2020-04-08 DIAGNOSIS — E038 Other specified hypothyroidism: Secondary | ICD-10-CM

## 2020-04-08 DIAGNOSIS — E119 Type 2 diabetes mellitus without complications: Secondary | ICD-10-CM | POA: Diagnosis not present

## 2020-04-08 DIAGNOSIS — E1165 Type 2 diabetes mellitus with hyperglycemia: Secondary | ICD-10-CM | POA: Diagnosis not present

## 2020-04-08 LAB — POCT GLYCOSYLATED HEMOGLOBIN (HGB A1C): HbA1c, POC (controlled diabetic range): 11.6 % — AB (ref 0.0–7.0)

## 2020-04-08 MED ORDER — OZEMPIC (0.25 OR 0.5 MG/DOSE) 2 MG/1.5ML ~~LOC~~ SOPN: 0.2500 mg | PEN_INJECTOR | SUBCUTANEOUS | 6 refills | Status: DC

## 2020-04-08 MED ORDER — LOSARTAN POTASSIUM 25 MG PO TABS: 25.0000 mg | ORAL_TABLET | Freq: Every day | ORAL | 3 refills | Status: DC

## 2020-04-08 MED ORDER — DAPAGLIFLOZIN PROPANEDIOL 5 MG PO TABS: 5.0000 mg | ORAL_TABLET | Freq: Every day | ORAL | 3 refills | Status: DC

## 2020-04-08 MED ORDER — AMLODIPINE BESYLATE 10 MG PO TABS: 10.0000 mg | ORAL_TABLET | Freq: Every day | ORAL | 3 refills | Status: DC

## 2020-04-08 NOTE — Patient Instructions (Addendum)
It was nice seeing you today [name]!  Please restart your levothyroxine and Ozempic.  Both of these medications are going to help you feel better and lose weight.  Please make sure to take your levothyroxine 1 hour before eating or taking any other medications, or at least take it the same way every day.  We are going to try a medication called Wilder Glade, which is a pill that you will take once per day.  This can also help with diabetes management and weight loss.  It can increase the risk for UTIs and vaginal yeast infections, so if you have any of these issues, please let me know.  Please return in about 3 months for follow-up of your diabetes.    If you have any questions or concerns, please feel free to call the clinic.   Be well,  Dr. Shan Levans

## 2020-04-08 NOTE — Progress Notes (Signed)
    SUBJECTIVE:   CHIEF COMPLAINT / HPI:   Type 2 Diabetes Mellitus: CBGs: does not take, thinks that they have been high Medications: Ozempic 0.25 mg once weekly, glipizide 10 mg Hypo/hyperglycemic symptoms: none Diet and exercise: has not been careful with her diet or physical activity recently.  Has been under increased stress with her job and family.  Also had a scary experience with steroid-induced hyperglycemia.  Has also had recurrent lymphadenopathy which has made her concerns that her Hodgkin's lymphoma has returned.  Is getting labs and scans with the cancer center soon.  Hypothyroidism Has not been taking her synthroid regularly Has had low energy, low libido, and has gained weight Finds it difficult to take her synthroid one hour before eating or taking other medications   PERTINENT  PMH / PSH: poorly controlled type 2 diabetes, Hodgkin's lymphoma, hypothyroidism  OBJECTIVE:   BP 135/80   Pulse 83   Ht 5\' 8"  (1.727 m)   Wt 230 lb (104.3 kg)   SpO2 98%   BMI 34.97 kg/m   General: well appearing, appears stated age Cardiac: RRR, no MRG Respiratory: CTAB, no rhonchi, rales, or wheezing, normal work of breathing Skin: no rashes or other lesions, warm and well perfused Psych: appropriate mood and affect   ASSESSMENT/PLAN:   Uncontrolled type 2 diabetes mellitus ([place]) Patient's A1c is 11.6 today, which unfortunately is significantly elevated from her previous level of 6.3.  This corresponds with patient's concern about her blood sugar and lack of adherence to medications.  We discussed in depth that these medications will help her feel better, avoid the downstream effects of type 2 diabetes, and may also help her lose weight.  She will restart Ozempic 0.25 mg weekly.  She cannot tolerate the increased dose of Ozempic due to nausea.  We will also start Farxiga 5 mg daily.  Patient was counseled on the possible side effects of UTI and yeast infection.  Our goal would be to  stop glipizide in the future.  We will also continue Metformin 500 mg once daily.  Patient cannot tolerate higher doses of this medication due to diarrhea.  Will obtain a BMP today due to slightly increased creatinine at her last check.  Other specified hypothyroidism Will obtain a TSH today.  I expect that it will be increased since patient says that she has not been taking her Synthroid regularly.  Discussed with her that she does not have to take Synthroid an hour before her first meal other medications; it is more important that she takes it at around the same time every day so that we can titrate her medications to her TSH level accurately.  Discussed that having a thyroid hormone level in the normal range is not only important for her overall health, it will also help her feel better and lose weight.  Would recommend getting a TSH at her next visit in 3 months as well.     Kathrene Alu, MD [place]

## 2020-04-09 LAB — BASIC METABOLIC PANEL
BUN/Creatinine Ratio: 14 (ref 9–23)
BUN: 11 mg/dL (ref 6–20)
CO2: 22 mmol/L (ref 20–29)
Calcium: 9.4 mg/dL (ref 8.7–10.2)
Chloride: 100 mmol/L (ref 96–106)
Creatinine, Ser: 0.78 mg/dL (ref 0.57–1.00)
GFR calc Af Amer: 113 mL/min/{1.73_m2} (ref >59)
GFR calc non Af Amer: 98 mL/min/{1.73_m2} (ref >59)
Glucose: 317 mg/dL — ABNORMAL HIGH (ref 65–99)
Potassium: 4.6 mmol/L (ref 3.5–5.2)
Sodium: 135 mmol/L (ref 134–144)

## 2020-04-09 LAB — TSH: TSH: 8.68 u[IU]/mL — ABNORMAL HIGH (ref 0.450–4.500)

## 2020-04-09 NOTE — Assessment & Plan Note (Signed)
Patient's A1c is 11.6 today, which unfortunately is significantly elevated from her previous level of 6.3.  This corresponds with patient's concern about her blood sugar and lack of adherence to medications.  We discussed in depth that these medications will help her feel better, avoid the downstream effects of type 2 diabetes, and may also help her lose weight.  She will restart Ozempic 0.25 mg weekly.  She cannot tolerate the increased dose of Ozempic due to nausea.  We will also start Farxiga 5 mg daily.  Patient was counseled on the possible side effects of UTI and yeast infection.  Our goal would be to stop glipizide in the future.  We will also continue Metformin 500 mg once daily.  Patient cannot tolerate higher doses of this medication due to diarrhea.  Will obtain a BMP today due to slightly increased creatinine at her last check.

## 2020-04-09 NOTE — Progress Notes (Signed)
Left VM with patient with results.  TSH is elevated, which is not surprising since she has not been taking her synthroid.  Reiterated our plan from her visit yesterday and encouraged her to call with any questions.

## 2020-04-09 NOTE — Assessment & Plan Note (Signed)
Will obtain a TSH today.  I expect that it will be increased since patient says that she has not been taking her Synthroid regularly.  Discussed with her that she does not have to take Synthroid an hour before her first meal other medications; it is more important that she takes it at around the same time every day so that we can titrate her medications to her TSH level accurately.  Discussed that having a thyroid hormone level in the normal range is not only important for her overall health, it will also help her feel better and lose weight.  Would recommend getting a TSH at her next visit in 3 months as well.

## 2020-04-23 ENCOUNTER — Other Ambulatory Visit: Payer: Self-pay

## 2020-04-23 ENCOUNTER — Inpatient Hospital Stay: Payer: 59

## 2020-04-23 DIAGNOSIS — Z8041 Family history of malignant neoplasm of ovary: Secondary | ICD-10-CM | POA: Diagnosis not present

## 2020-04-23 DIAGNOSIS — Z8572 Personal history of non-Hodgkin lymphomas: Secondary | ICD-10-CM | POA: Diagnosis not present

## 2020-04-23 DIAGNOSIS — E039 Hypothyroidism, unspecified: Secondary | ICD-10-CM | POA: Diagnosis not present

## 2020-04-23 DIAGNOSIS — Z923 Personal history of irradiation: Secondary | ICD-10-CM | POA: Diagnosis not present

## 2020-04-23 DIAGNOSIS — Z87891 Personal history of nicotine dependence: Secondary | ICD-10-CM | POA: Diagnosis not present

## 2020-04-23 DIAGNOSIS — E1165 Type 2 diabetes mellitus with hyperglycemia: Secondary | ICD-10-CM | POA: Diagnosis not present

## 2020-04-23 DIAGNOSIS — I10 Essential (primary) hypertension: Secondary | ICD-10-CM | POA: Diagnosis not present

## 2020-04-23 DIAGNOSIS — Z9221 Personal history of antineoplastic chemotherapy: Secondary | ICD-10-CM | POA: Diagnosis not present

## 2020-04-23 DIAGNOSIS — E119 Type 2 diabetes mellitus without complications: Secondary | ICD-10-CM

## 2020-04-23 DIAGNOSIS — J45909 Unspecified asthma, uncomplicated: Secondary | ICD-10-CM | POA: Diagnosis not present

## 2020-04-23 DIAGNOSIS — Z794 Long term (current) use of insulin: Secondary | ICD-10-CM | POA: Diagnosis not present

## 2020-04-23 DIAGNOSIS — Z79899 Other long term (current) drug therapy: Secondary | ICD-10-CM | POA: Diagnosis not present

## 2020-04-23 DIAGNOSIS — Z808 Family history of malignant neoplasm of other organs or systems: Secondary | ICD-10-CM | POA: Diagnosis not present

## 2020-04-23 DIAGNOSIS — Z8571 Personal history of Hodgkin lymphoma: Secondary | ICD-10-CM

## 2020-04-23 LAB — CBC WITH DIFFERENTIAL (CANCER CENTER ONLY)
Abs Immature Granulocytes: 0.02 10*3/uL (ref 0.00–0.07)
Basophils Absolute: 0 10*3/uL (ref 0.0–0.1)
Basophils Relative: 1 %
Eosinophils Absolute: 0.1 10*3/uL (ref 0.0–0.5)
Eosinophils Relative: 3 %
HCT: 40.9 % (ref 36.0–46.0)
Hemoglobin: 14 g/dL (ref 12.0–15.0)
Immature Granulocytes: 1 %
Lymphocytes Relative: 28 %
Lymphs Abs: 1.2 10*3/uL (ref 0.7–4.0)
MCH: 31.6 pg (ref 26.0–34.0)
MCHC: 34.2 g/dL (ref 30.0–36.0)
MCV: 92.3 fL (ref 80.0–100.0)
Monocytes Absolute: 0.4 10*3/uL (ref 0.1–1.0)
Monocytes Relative: 10 %
Neutro Abs: 2.5 10*3/uL (ref 1.7–7.7)
Neutrophils Relative %: 57 %
Platelet Count: 258 10*3/uL (ref 150–400)
RBC: 4.43 MIL/uL (ref 3.87–5.11)
RDW: 11.9 % (ref 11.5–15.5)
WBC Count: 4.3 10*3/uL (ref 4.0–10.5)
nRBC: 0 % (ref 0.0–0.2)

## 2020-04-23 LAB — CMP (CANCER CENTER ONLY)
ALT: 17 U/L (ref 0–44)
AST: 15 U/L (ref 15–41)
Albumin: 4.1 g/dL (ref 3.5–5.0)
Alkaline Phosphatase: 58 U/L (ref 38–126)
Anion gap: 9 (ref 5–15)
BUN: 14 mg/dL (ref 6–20)
CO2: 26 mmol/L (ref 22–32)
Calcium: 9.2 mg/dL (ref 8.9–10.3)
Chloride: 106 mmol/L (ref 98–111)
Creatinine: 0.78 mg/dL (ref 0.44–1.00)
GFR, Est AFR Am: >60 mL/min (ref >60)
GFR, Estimated: >60 mL/min (ref >60)
Glucose, Bld: 148 mg/dL — ABNORMAL HIGH (ref 70–99)
Potassium: 4.7 mmol/L (ref 3.5–5.1)
Sodium: 141 mmol/L (ref 135–145)
Total Bilirubin: 1.1 mg/dL (ref 0.3–1.2)
Total Protein: 7.5 g/dL (ref 6.5–8.1)

## 2020-04-24 ENCOUNTER — Telehealth: Payer: Self-pay

## 2020-04-24 LAB — LACTATE DEHYDROGENASE: LDH: 144 U/L (ref 98–192)

## 2020-04-24 NOTE — Telephone Encounter (Signed)
TC to central scheduling [phone]) per Wilber Bihari NP to schedule patients CT's that have been authorized. Patient is scheduled for next Friday 05/01/20 @7 :30am. Patient is to arrive by 7:15am, be NPO after midnight, drink first contrast bottle at 5:30am, second contrast bottle at 6:30am. Patient made aware of appointment date, time, location, and instructions. Patient verbalized understanding. Also let patient know that she would need to come by cancer center and pick up the 2 bottles of contrast and patient verbalized understanding.

## 2020-05-01 ENCOUNTER — Other Ambulatory Visit: Payer: Self-pay

## 2020-05-01 ENCOUNTER — Ambulatory Visit (HOSPITAL_COMMUNITY)
Admission: RE | Admit: 2020-05-01 | Discharge: 2020-05-01 | Disposition: A | Discharge status: Home / Self Care | Payer: No Typology Code available for payment source | Source: Ambulatory Visit | Attending: Adult Health | Admitting: Adult Health

## 2020-05-01 ENCOUNTER — Encounter (HOSPITAL_COMMUNITY): Payer: Self-pay

## 2020-05-01 DIAGNOSIS — E119 Type 2 diabetes mellitus without complications: Secondary | ICD-10-CM | POA: Insufficient documentation

## 2020-05-01 DIAGNOSIS — Z8571 Personal history of Hodgkin lymphoma: Secondary | ICD-10-CM | POA: Insufficient documentation

## 2020-05-01 DIAGNOSIS — C8190 Hodgkin lymphoma, unspecified, unspecified site: Secondary | ICD-10-CM | POA: Diagnosis not present

## 2020-05-01 MED ORDER — SODIUM CHLORIDE (PF) 0.9 % IJ SOLN
INTRAMUSCULAR | Status: AC
  Filled 2020-05-01: qty 50

## 2020-05-01 MED ORDER — IOHEXOL 300 MG/ML  SOLN
100.0000 mL | Freq: Once | INTRAMUSCULAR | Status: AC | PRN
  Administered 2020-05-01: 100 mL via INTRAVENOUS

## 2020-07-03 ENCOUNTER — Other Ambulatory Visit: Payer: Self-pay | Admitting: Student in an Organized Health Care Education/Training Program

## 2020-07-03 DIAGNOSIS — E039 Hypothyroidism, unspecified: Secondary | ICD-10-CM

## 2020-12-03 NOTE — Progress Notes (Signed)
    SUBJECTIVE:   CHIEF COMPLAINT / HPI:   Diabetic Follow Up: Patient is a 40 y.o. female who present today for diabetic follow up.   Patient endorses difficulties with medication compliance and difficulties with diet since last A1C.  Home medications include: Farxiga 5mg  daily, Metformin 500mg  daily, Ozempic 0.25mg  weekly, and Glipizide 10mg . Of these, she reports only taking her Metformin and Glipizide and she does not take them every day.   Most recent A1Cs:  Lab Results  Component Value Date   HGBA1C 12.8 (A) 12/04/2020   HGBA1C 11.6 (A) 04/08/2020   HGBA1C 6.3 11/12/2019   Last Microalbumin, LDL, Creatinine: Lab Results  Component Value Date   MICROALBUR 150 07/11/2019   LDLCALC 118 (H) 01/18/2018   CREATININE 0.92 12/04/2020   Patient does not check blood glucose on a regular basis.  Patient is up to date on diabetic eye exam. Patient is not up to date on diabetic foot exam.  Hypertension: Patient is a 40 y.o. female who present today for follow up of hypertension.   Patient endorses difficulties with medication compliance  Home medications include: Amlodipine 10mg  daily and Losartan 25mg  daily. Patient takes her Amlodipine some days, but has not been taking her Losartan.  Most recent creatinine trend:  Lab Results  Component Value Date   CREATININE 0.92 12/04/2020   CREATININE 0.78 04/23/2020   CREATININE 0.78 04/08/2020   Patient does check blood pressure at home. BP normally- 120s/100s  Patient has had a BMP in the past 1 year.   PERTINENT  PMH / PSH: Hodgkin's lymphoma (2015), T2DM, HTN, Depression, hypothyroidism  OBJECTIVE:   BP (!) 140/100   Gen: alert, well-appearing, NAD CV: RRR, normal S1/S2 Resp: normal WOB, lungs CTAB Abd: soft, nontender Diabetic Foot Exam - Simple   Simple Foot Form Diabetic Foot exam was performed with the following findings: Yes 12/04/2020  2:31 PM  Visual Inspection No deformities, no ulcerations, no other skin  breakdown bilaterally: Yes Sensation Testing Intact to touch and monofilament testing bilaterally: Yes Pulse Check Posterior Tibialis and Dorsalis pulse intact bilaterally: Yes Comments     ASSESSMENT/PLAN:   Uncontrolled type 2 diabetes mellitus (HCC) Uncontrolled, A1c worse from prior and remains significantly above goal. A1c 12.8% today. Patient is asymptomatic and fortunately at this time does not have any long term sequelae of diabetes. -Foot exam performed today -Metformin 500mg  daily at bedtime -Glipizide 10mg  daily with breakfast -Restart Ozempic 0.25mg  weekly (with plan to titrate up) -Extensive counseling and motivational interviewing performed to encourage increased compliance with medications -Very close follow-up (2 weeks)  Hypertension associated with diabetes (HCC) Uncontrolled. BP 140/100 today, which is above goal. She has been non-compliant with medications. -BMP today -Amlodipine 10mg  daily -Extensive counseling and motivational interviewing performed to increased medication compliance -Consider re-adding losartan if patient has improved compliance and still with elevated BP   Other specified hypothyroidism Non-compliant with home Synthroid -TSH today -Will re-start Synthroid pending TSH results -Encouraged compliance   Return in 2 weeks.  Discussed case with Dr. .  02/01/2021, MD [facility]

## 2020-12-04 ENCOUNTER — Encounter: Payer: Self-pay | Admitting: Family Medicine

## 2020-12-04 ENCOUNTER — Other Ambulatory Visit: Payer: Self-pay

## 2020-12-04 ENCOUNTER — Ambulatory Visit (INDEPENDENT_AMBULATORY_CARE_PROVIDER_SITE_OTHER): Payer: No Typology Code available for payment source | Admitting: Family Medicine

## 2020-12-04 VITALS — BP 140/100

## 2020-12-04 DIAGNOSIS — E1165 Type 2 diabetes mellitus with hyperglycemia: Secondary | ICD-10-CM | POA: Diagnosis not present

## 2020-12-04 DIAGNOSIS — E1159 Type 2 diabetes mellitus with other circulatory complications: Secondary | ICD-10-CM | POA: Diagnosis not present

## 2020-12-04 DIAGNOSIS — I152 Hypertension secondary to endocrine disorders: Secondary | ICD-10-CM

## 2020-12-04 DIAGNOSIS — Z23 Encounter for immunization: Secondary | ICD-10-CM | POA: Diagnosis not present

## 2020-12-04 DIAGNOSIS — E038 Other specified hypothyroidism: Secondary | ICD-10-CM

## 2020-12-04 LAB — POCT GLYCOSYLATED HEMOGLOBIN (HGB A1C): Hemoglobin A1C: 12.8 % — AB (ref 4.0–5.6)

## 2020-12-04 MED ORDER — GLIPIZIDE 10 MG PO TABS: 10.0000 mg | ORAL_TABLET | Freq: Every day | ORAL | 3 refills | Status: DC

## 2020-12-04 MED ORDER — METFORMIN HCL ER 500 MG PO TB24: 500.0000 mg | ORAL_TABLET | Freq: Every day | ORAL | 3 refills | Status: DC

## 2020-12-04 MED ORDER — OZEMPIC (0.25 OR 0.5 MG/DOSE) 2 MG/1.5ML ~~LOC~~ SOPN: 0.2500 mg | PEN_INJECTOR | SUBCUTANEOUS | 6 refills | Status: DC

## 2020-12-04 NOTE — Patient Instructions (Addendum)
It was great to meet you!  Our plans for today:  -We are going to work together to improve your diabetes  Your medication regimen: At breakfast: Take your Glipizide and your Synthroid At bedtime: Take your Amlodipine and your Metformin Take your Ozempic ONCE per week (choose a day)  Keep a calender on your fridge and reward yourself as appropriate.  We are checking some labs today, I will call you with the results.  Take care and seek immediate care sooner if you develop any concerns.   Dr. [facility]

## 2020-12-05 LAB — BASIC METABOLIC PANEL
BUN/Creatinine Ratio: 16 (ref 9–23)
BUN: 15 mg/dL (ref 6–20)
CO2: 22 mmol/L (ref 20–29)
Calcium: 9.4 mg/dL (ref 8.7–10.2)
Chloride: 98 mmol/L (ref 96–106)
Creatinine, Ser: 0.92 mg/dL (ref 0.57–1.00)
GFR calc Af Amer: 92 mL/min/{1.73_m2} (ref >59)
GFR calc non Af Amer: 80 mL/min/{1.73_m2} (ref >59)
Glucose: 416 mg/dL — ABNORMAL HIGH (ref 65–99)
Potassium: 4.8 mmol/L (ref 3.5–5.2)
Sodium: 134 mmol/L (ref 134–144)

## 2020-12-05 LAB — TSH: TSH: 11.1 u[IU]/mL — ABNORMAL HIGH (ref 0.450–4.500)

## 2020-12-05 NOTE — Assessment & Plan Note (Signed)
Uncontrolled, A1c worse from prior and remains significantly above goal. A1c 12.8% today. Patient is asymptomatic and fortunately at this time does not have any long term sequelae of diabetes. -Foot exam performed today -Metformin 500mg  daily at bedtime -Glipizide 10mg  daily with breakfast -Restart Ozempic 0.25mg  weekly (with plan to titrate up) -Extensive counseling and motivational interviewing performed to encourage increased compliance with medications -Very close follow-up (2 weeks)

## 2020-12-05 NOTE — Assessment & Plan Note (Addendum)
Uncontrolled. BP 140/100 today, which is above goal. She has been non-compliant with medications. -BMP today -Amlodipine 10mg  daily -Extensive counseling and motivational interviewing performed to increased medication compliance -Consider re-adding losartan if patient has improved compliance and still with elevated BP

## 2020-12-05 NOTE — Assessment & Plan Note (Addendum)
Non-compliant with home Synthroid -TSH today -Will re-start Synthroid pending TSH results -Encouraged compliance

## 2020-12-24 NOTE — Progress Notes (Deleted)
    SUBJECTIVE:   CHIEF COMPLAINT / HPI:   Diabetic Follow Up: Patient is a 40 y.o. female who present today for diabetic follow up.   Hypertension Follow Up:   PERTINENT  PMH / PSH: T2DM, HTN hypothyroidism, depression, Hodgkin's lymphoma (2015)  OBJECTIVE:   There were no vitals taken for this visit.  ***  ASSESSMENT/PLAN:   No problem-specific Assessment & Plan notes found for this encounter.     Alcus Dad, MD [place]

## 2020-12-25 ENCOUNTER — Ambulatory Visit: Payer: No Typology Code available for payment source | Admitting: Family Medicine

## 2021-01-06 ENCOUNTER — Other Ambulatory Visit: Payer: Self-pay | Admitting: Family Medicine

## 2021-01-06 DIAGNOSIS — E039 Hypothyroidism, unspecified: Secondary | ICD-10-CM

## 2021-01-06 MED ORDER — LEVOTHYROXINE SODIUM 100 MCG PO TABS
100.0000 ug | ORAL_TABLET | Freq: Every day | ORAL | 3 refills | Status: DC
Start: 2021-01-06 — End: 2021-12-02

## 2021-01-07 DIAGNOSIS — H5213 Myopia, bilateral: Secondary | ICD-10-CM | POA: Diagnosis not present

## 2021-01-07 DIAGNOSIS — H25813 Combined forms of age-related cataract, bilateral: Secondary | ICD-10-CM | POA: Diagnosis not present

## 2021-01-07 DIAGNOSIS — Z7984 Long term (current) use of oral hypoglycemic drugs: Secondary | ICD-10-CM | POA: Diagnosis not present

## 2021-01-07 DIAGNOSIS — H524 Presbyopia: Secondary | ICD-10-CM | POA: Diagnosis not present

## 2021-01-07 DIAGNOSIS — E119 Type 2 diabetes mellitus without complications: Secondary | ICD-10-CM | POA: Diagnosis not present

## 2021-02-01 NOTE — Progress Notes (Signed)
    SUBJECTIVE:   CHIEF COMPLAINT / HPI:   Diabetes Patient with a hx of poorly controlled diabetes (last A1c 12.8%) and medication non-compliance. Prescribed medication regimen: Metformin 500mg  daily, Glipizide 10mg  daily, Ozempic 0.25mg  weekly Reports excellent compliance with her Ozempic and fair compliance with her Glipizide (forgets 1-2x/week). Patient stopped taking her Metformin-- states she was unable to tolerate it. Reports she has some Farxiga left over from a prior prescription/sample and has been taking that as well.  Hypertension Currently on Amlodipine 10mg  daily. States she did not take her amlodipine today, but took it yesterday. Forgets to take her meds 1-2x/week. Will sometimes check her blood pressure at home and it will be elevated to >315Q systolic. Denies symptoms.  PERTINENT  PMH / PSH: Hodgkin's lymphoma (2015), T2DM, HTN, Depression, hypothyroidism  OBJECTIVE:   BP (!) 173/111   Ht 5\' 8"  (1.727 m)   Wt 229 lb (103.9 kg)   BMI 34.82 kg/m   Gen: alert, well-appearing, NAD CV: RRR, normal S1/S2 Resp: comfortable WOB on room air Ext: no peripheral edema Neuro: grossly intact, normal gait  ASSESSMENT/PLAN:   Uncontrolled type 2 diabetes mellitus (HCC) Last A1c 12.8 (on 12/05/2020). Patient has ongoing difficulty with medication compliance although this is slightly improved from last visit. -Will refer to pharmacy team for frequent visits and additional medication management -Increased Ozempic to 0.5mg  weekly -Continue Glipizide 10mg  daily and Farxiga 5mg  daily -Counseled on long term effects of uncontrolled diabetes  Hypertension associated with diabetes ([place]) Uncontrolled. BP 166/119 today, repeat BP 173/111. Patient's BP is chronically elevated above 008 systolic, and frequently above 676 diastolic. She remains asymptomatic. BMET in January 2022 was within normal limits other than elevated glucose. -Continue Amlodipine 10mg  daily -Restart Losartan 25mg   daily -F/u with pharmacy team -Again counseled on the importance of medication compliance and the long term sequelae of uncontrolled HTN   Alcus Dad, MD [place]

## 2021-02-02 ENCOUNTER — Ambulatory Visit (INDEPENDENT_AMBULATORY_CARE_PROVIDER_SITE_OTHER): Payer: No Typology Code available for payment source | Admitting: Family Medicine

## 2021-02-02 ENCOUNTER — Other Ambulatory Visit: Payer: Self-pay

## 2021-02-02 ENCOUNTER — Encounter: Payer: Self-pay | Admitting: Family Medicine

## 2021-02-02 VITALS — BP 173/111 | Ht 68.0 in | Wt 229.0 lb

## 2021-02-02 DIAGNOSIS — I152 Hypertension secondary to endocrine disorders: Secondary | ICD-10-CM | POA: Diagnosis not present

## 2021-02-02 DIAGNOSIS — E1159 Type 2 diabetes mellitus with other circulatory complications: Secondary | ICD-10-CM | POA: Diagnosis not present

## 2021-02-02 DIAGNOSIS — E1165 Type 2 diabetes mellitus with hyperglycemia: Secondary | ICD-10-CM | POA: Diagnosis not present

## 2021-02-02 MED ORDER — OZEMPIC (0.25 OR 0.5 MG/DOSE) 2 MG/1.5ML ~~LOC~~ SOPN: 0.5000 mg | PEN_INJECTOR | SUBCUTANEOUS | 6 refills | Status: DC

## 2021-02-02 MED ORDER — LOSARTAN POTASSIUM 25 MG PO TABS: 25.0000 mg | ORAL_TABLET | Freq: Every day | ORAL | 0 refills | Status: DC

## 2021-02-02 NOTE — Patient Instructions (Addendum)
It was great to see you!  Our plans for today:  - I have sent the increased dose of Ozempic to your pharmacy - I also want you to re-start Losartan for your blood pressure, in addition to the amlodipine. I have sent this to your pharmacy as well.   -It is VERY important to come to your appointment with our pharmacist on Monday at 9:30am. Bring ALL your medications to this appointment.  Take care and seek immediate care sooner if you develop any concerns.   Dr. [facility]

## 2021-02-03 NOTE — Assessment & Plan Note (Signed)
Uncontrolled. BP 166/119 today, repeat BP 173/111. Patient's BP is chronically elevated above 540 systolic, and frequently above 086 diastolic. She remains asymptomatic. BMET in January 2022 was within normal limits other than elevated glucose. -Continue Amlodipine 10mg  daily -Restart Losartan 25mg  daily -F/u with pharmacy team -Again counseled on the importance of medication compliance and the long term sequelae of uncontrolled HTN

## 2021-02-03 NOTE — Assessment & Plan Note (Signed)
Last A1c 12.8 (on 12/05/2020). Patient has ongoing difficulty with medication compliance although this is slightly improved from last visit. -Will refer to pharmacy team for frequent visits and additional medication management -Increased Ozempic to 0.5mg  weekly -Continue Glipizide 10mg  daily and Farxiga 5mg  daily -Counseled on long term effects of uncontrolled diabetes

## 2021-02-08 ENCOUNTER — Ambulatory Visit (INDEPENDENT_AMBULATORY_CARE_PROVIDER_SITE_OTHER): Payer: No Typology Code available for payment source | Admitting: Pharmacist

## 2021-02-08 ENCOUNTER — Other Ambulatory Visit: Payer: Self-pay | Admitting: Family Medicine

## 2021-02-08 ENCOUNTER — Other Ambulatory Visit: Payer: Self-pay

## 2021-02-08 VITALS — BP 158/90 | Ht 67.0 in | Wt 230.2 lb

## 2021-02-08 DIAGNOSIS — E1165 Type 2 diabetes mellitus with hyperglycemia: Secondary | ICD-10-CM

## 2021-02-08 DIAGNOSIS — I152 Hypertension secondary to endocrine disorders: Secondary | ICD-10-CM

## 2021-02-08 DIAGNOSIS — E1159 Type 2 diabetes mellitus with other circulatory complications: Secondary | ICD-10-CM

## 2021-02-08 MED ORDER — GLUCOSE BLOOD VI STRP: ORAL_STRIP | 12 refills | Status: DC

## 2021-02-08 MED ORDER — ONETOUCH VERIO W/DEVICE KIT: 1.0000 | PACK | 0 refills | Status: DC

## 2021-02-08 MED ORDER — ONETOUCH ULTRASOFT LANCETS MISC: 5 refills | Status: DC

## 2021-02-08 NOTE — Addendum Note (Signed)
Addended by: Alcus Dad on: 02/08/2021 03:19 PM   Modules accepted: Orders

## 2021-02-08 NOTE — Progress Notes (Signed)
Subjective:    Patient ID: [name], female    DOB: 04/21/1981, 40 y.o.   MRN: [id number]  HPI Patient is a 40 y.o. female who presents for diabetes and hypertension management. She is in good spirits and presents without assistance. Patient was referred and last seen by Primary Care Provider on 02/02/21.  Patient reports diabetes was diagnosed when she was 24-36 years old.   Insurance coverage/medication affordability: Commercial  Family/Social history: T2DM: brother, father, grandparents. She works as a Theatre manager at [employer] and lives with her 19 year old daughter.  Current diabetes medications include: Ozempic 0.5mg  (x1 dose), Farxiga 5mg , Glipizide 10mg  Patient states that she is taking her diabetes medications as prescribed. Patient denies adherence with medications. She states that she misses her medications 1-2 times per week, on average, which is much improved compared to previously reported adherence.  Current BP Medications include:  Losartan 25mg , amlodipine 10mg  Patient states that she is taking her diabetes medications as prescribed. Patient denies adherence with medications. She states that she misses her medications 1-2 times per week, on average, which is much improved compared to previously reported adherence.  Do you feel that your medications are working for you?  Depends on day; states she experiences symptoms of hypoglycemia and wonders why her medications are causing this  Have you been experiencing any side effects to the medications prescribed? Yes; felt slightly nauseous after taking increased dose of Ozempic last night  Do you have any problems obtaining medications due to transportation or finances?  No   Patient reported dietary habits:  Eats 1-2 meals/day and 1-2 snacks/day; work schedule does not allow for consistent eating habits Breakfast: typically only eats on day off (Sunday) and cooks a meal that typically consists of protein  and carb  Lunch: skips Dinner: typically only eats on day off (Sunday) and cooks a meal that typically consists of protein, veggie, and carb Snacks: granola, banana, pimento cheese snacks Drinks: kombucha (1-2/day), water (gallon/day)  Patient-reported exercise habits: began going to gym 3x/week for past month and a half   Patient reports hypoglycemic events but does not verify with glucometer. Patient denies polyuria (increased urination).  Patient denies polyphagia (increased appetite).  Patient denies polydipsia (increased thirst).  Patient denies neuropathy (nerve pain). Patient reports visual changes; had recent eye appt where she learned she has early onset cataracts Patient reports self foot exams.   Patient does not check her blood glucose and cannot recall the last time she used her glucometer.   Patient does not have home blood pressure monitor.  Objective:   Labs:    Lab Results  Component Value Date   HGBA1C 12.8 (A) 12/04/2020   HGBA1C 11.6 (A) 04/08/2020   HGBA1C 6.3 11/12/2019   Vitals:   02/08/21 1027  BP: (!) 158/90  Height: 5\' 7"  (1.702 m)  Weight: 230 lb 3.2 oz (104.4 kg)  BMI (Calculated): 36.05    Lab Results  Component Value Date   MICRALBCREAT >300 07/11/2019    Lipid Panel     Component Value Date/Time   CHOL 222 (H) 01/18/2018 1119   TRIG 87 01/18/2018 1119   HDL 87 01/18/2018 1119   CHOLHDL 2.6 01/18/2018 1119   CHOLHDL 2.5 07/08/2014 1046   VLDL 16 07/08/2014 1046   LDLCALC 118 (H) 01/18/2018 1119    Clinical Atherosclerotic Cardiovascular Disease (ASCVD): No  The ASCVD Risk score Mikey Bussing DC Jr., et al., 2013) failed to calculate for the  following reasons:   The 2013 ASCVD risk score is only valid for ages 8 to 18   PHQ-9 Score: 4  Assessment/Plan:   T2DM is not controlled likely due to suboptimal medication adherence and dietary habits. Additional pharmacotherapy is needed, however, will refrain from initiating at this visit.  Discussed with patient she will likely need to begin insulin therapy, but would benefit from having glucometer readings before initiation. Patient agreeable to check fasting blood glucose at least a couple times a week. In addition, discussed that Kombucha is typically high in sugar. Patient agreeable to look for alternatives lower in sugar as well as checking blood glucose before and after drinking Kombucha to visually confirm rise in blood glucose that is to be expected. Discontinued glipizide 10mg  as patient is not consuming regular meals daily, and this is likely leading to her hypoglycemic symptoms. Following instruction patient verbalized understanding of treatment plan.   HTN is not controlled likely due to suboptimal medication adherence. Additional pharmacotherapy is not needed at this time, as patient only improved on adherence 6 days ago at last PCP visit. Will not see full effects of improved adherence to blood pressure medications until next week, therefore re-check blood pressure at next in-person visit to decide if additional pharmacotherapy is warranted. Also discussed with patient benefits of purchasing at home blood pressure monitor, as this could help confirm whether her blood pressure readings are typically higher in office vs at home. Following instruction patient verbalized understanding of treatment plan.   1. Discontinue glipizide 10mg  2. Continue all other medications as prescribed 3. Extensively discussed pathophysiology of diabetes, recommended lifestyle interventions, dietary effects on blood sugar control 4. Patient will adhere to dietary modifications of decreasing kombucha 5. Patient will continue to exercise with goal to increase towards target of at least 150 minutes of moderate intensity exercise weekly 6. Counseled on s/sx of and management of hypoglycemia 7. Next A1C anticipated 03/04/21.  Follow-up appointment via telephone in two weeks to review blood sugar and blood  pressure readings. Written patient instructions provided.  This appointment required 60 minutes of patient care (this includes precharting, chart review, review of results, and face-to-face care).  Thank you for involving pharmacy to assist in providing this patient's care.  Patient seen with Inis Sizer, PharmD Candidate, and Shauna Hugh, PharmD - PGY-1 Resident.

## 2021-02-08 NOTE — Patient Instructions (Addendum)
[name] it was a pleasure seeing you today.   Please do the following:  1. Continue taking your medications as directed today during your appointment. If you have any questions or if you believe something has occurred because of this change, call me or your doctor to let one of [place] know.  2. STOP taking your glipizide 10mg .  3. Start checking blood sugars at home. It's really important that you record these and bring these in to your next doctor's appointment. If you cannot find your glucometer please call. 4. Continue taking blood pressure medications as prescribed.  5. Start taking your blood pressure at home if you are able. Please write down these numbers and bring them to your visits. 6. Limiting salt intake and caffeine. 7. Exercising as able for at least 30 minutes for 5 days out of the week, can also help you lower your blood pressure. 8. Follow-up with via telephone on March 24th.   Hypoglycemia or low blood sugar:   Low blood sugar can happen quickly and may become an emergency if not treated right away.   While this shouldn't happen often, it can be brought upon if you skip a meal or do not eat enough. Also, if your insulin or other diabetes medications are dosed too high, this can cause your blood sugar to go to low.   Warning signs of low blood sugar include: 1. Feeling shaky or dizzy 2. Feeling weak or tired  3. Excessive hunger 4. Feeling anxious or upset  5. Sweating even when you aren't exercising  What to do if I experience low blood sugar? Follow the Rule of 15 1. Check your blood sugar with your meter. If lower than 70, proceed to step 2.  2. Treat with 15 grams of fast acting carbs which is found in 3-4 glucose tablets. If none are available you can try hard candy, 1 tablespoon of sugar or honey,4 ounces of fruit juice, or 6 ounces of REGULAR soda.  3. Re-check your sugar in 15 minutes. If it is still below 70, do what you did in step 2 again. If your blood  sugar has come back up, go ahead and eat a snack or small meal made up of complex carbs (ex. Whole grains) and protein at this time to avoid recurrence of low blood sugar.  If you have any questions please call the clinic or myself at [phone].

## 2021-02-18 MED ORDER — ONETOUCH VERIO W/DEVICE KIT
1.0000 | PACK | 0 refills | Status: DC
Start: 2021-02-18 — End: 2021-02-19

## 2021-02-18 NOTE — Progress Notes (Signed)
Attempted to re-send glucometer to patient's pharmacy per request from [place], [facility]

## 2021-02-18 NOTE — Addendum Note (Signed)
Addended by: Alcus Dad on: 02/18/2021 04:01 PM   Modules accepted: Orders

## 2021-02-19 ENCOUNTER — Telehealth: Payer: Self-pay

## 2021-02-19 DIAGNOSIS — E1165 Type 2 diabetes mellitus with hyperglycemia: Secondary | ICD-10-CM

## 2021-02-19 MED ORDER — ONETOUCH DELICA LANCING DEV MISC: 0 refills | Status: DC

## 2021-02-19 MED ORDER — ONETOUCH DELICA LANCETS 33G MISC: 12 refills | Status: DC

## 2021-02-19 MED ORDER — ONETOUCH VERIO W/DEVICE KIT: PACK | 0 refills | Status: DC

## 2021-02-19 MED ORDER — ONETOUCH VERIO VI STRP: ORAL_STRIP | 12 refills | Status: DC

## 2021-02-19 NOTE — Telephone Encounter (Signed)
Due to issues with e-prescribing diabetic supplies, paper rx faxed to pharmacy at [phone]. Copy made and placed in batch scanning.   Talbot Grumbling, RN

## 2021-02-24 NOTE — Progress Notes (Signed)
    SUBJECTIVE:   CHIEF COMPLAINT / HPI:   Diabetes Follow-Up Current meds: Ozempic 0.5mg  once weekly and Farxiga 5mg  daily. Her compliance is much improved from prior. Forgets 1x/week (typically on Saturdays).  Patient had an appointment w/pharmacy team on 02/08/21, at which time glipizide was discontinued. She was instructed to check her blood sugars regularly and that she'd likely need to start insulin (based on prior A1c of 12.8).  She has not checked any blood sugars yet b/c there were difficulties sending glucometer & testing supplies to pharmacy. These issues have resolved and she will start checking her sugars. She has successfully made some dietary changes-- significantly reduced Kombucha intake.  Diabetic eye and foot exams are UTD.  HTN f/u On Amlodipine 10mg  daily and Losartan 25mg  daily. Adherence much improved from prior. Forgets meds 1x/week. Patient was unable to buy OTC blood pressure cuff due to financial barriers.  IUD Patient had IUD placed 04/2013 which needs to be removed. The strings were not seen in 2018, but ultrasound confirmed normal placement. Has not had any follow-up on her IUD since that time.   PERTINENT  PMH / PSH: T2DM, HTN, hypothyroidism, Hodgkin's lymphoma (2015)  OBJECTIVE:   BP (!) 142/70   Ht 5\' 7"  (1.702 m)   Wt 226 lb (102.5 kg)   BMI 35.40 kg/m   Gen: alert, well-appearing, NAD CV: RRR, II/VI systolic ejection murmur Resp: normal WOB, lungs CTAB Neuro: grossly intact  ASSESSMENT/PLAN:   Hypertension associated with diabetes ([place]) Improved from prior, still above goal. Last BMP Jan 2022 was wnl. -Continue Amlodipine 10mg  daily and Losartan 25mg  daily (no med adjustments today) -Sent Rx for home blood pressure cuff -Will consider increasing Losartan to 50mg  daily at next appt pending home BP readings  Uncontrolled type 2 diabetes mellitus (HCC) A1C 9.0 today which is much improved from 12.8 three months ago, but still above  goal. -Continue Farxiga 5mg  daily and Ozempic 0.5mg  weekly (no med changes today) -Patient to start checking blood sugars and will bring log to next appt -Consider initiating insulin therapy at next appt pending home glucose readings (scheduled w/pharmacy 03/22/21) -Congratulated patient on progress, emphasized the importance of ongoing medication adherence and lifestyle modifications -Plan to initiate statin at next visit  IUD (intrauterine device) in place Mirena placed in 2014. Overdue for removal. Strings were not seen in 2018 but ultrasound confirmed location in uterus at that time. Patient declines exam today to check for strings (she is certain they are not visible). -Referral placed to OBGYN for possible surgical removal  Other specified hypothyroidism TSH 11.1 in January 2022. Patient had ben off medication, but was re-started on Synthroid 163mcg daily at that time. Reports overall good compliance w/meds. -Recheck TSH today -Continue 171mcg Synthroid daily     Alcus Dad, MD [place]

## 2021-02-25 ENCOUNTER — Encounter: Payer: Self-pay | Admitting: Family Medicine

## 2021-02-25 ENCOUNTER — Ambulatory Visit (INDEPENDENT_AMBULATORY_CARE_PROVIDER_SITE_OTHER): Payer: No Typology Code available for payment source | Admitting: Family Medicine

## 2021-02-25 ENCOUNTER — Other Ambulatory Visit: Payer: Self-pay

## 2021-02-25 VITALS — BP 142/70 | Ht 67.0 in | Wt 226.0 lb

## 2021-02-25 DIAGNOSIS — Z975 Presence of (intrauterine) contraceptive device: Secondary | ICD-10-CM

## 2021-02-25 DIAGNOSIS — E1159 Type 2 diabetes mellitus with other circulatory complications: Secondary | ICD-10-CM

## 2021-02-25 DIAGNOSIS — E039 Hypothyroidism, unspecified: Secondary | ICD-10-CM | POA: Diagnosis not present

## 2021-02-25 DIAGNOSIS — I152 Hypertension secondary to endocrine disorders: Secondary | ICD-10-CM | POA: Diagnosis not present

## 2021-02-25 DIAGNOSIS — E038 Other specified hypothyroidism: Secondary | ICD-10-CM

## 2021-02-25 DIAGNOSIS — E1165 Type 2 diabetes mellitus with hyperglycemia: Secondary | ICD-10-CM | POA: Diagnosis not present

## 2021-02-25 LAB — POCT GLYCOSYLATED HEMOGLOBIN (HGB A1C): HbA1c, POC (controlled diabetic range): 9 % — AB (ref 0.0–7.0)

## 2021-02-25 MED ORDER — BLOOD PRESSURE CUFF MISC: 0 refills | Status: DC

## 2021-02-25 NOTE — Assessment & Plan Note (Signed)
A1C 9.0 today which is much improved from 12.8 three months ago, but still above goal. -Continue Farxiga 5mg  daily and Ozempic 0.5mg  weekly (no med changes today) -Patient to start checking blood sugars and will bring log to next appt -Consider initiating insulin therapy at next appt pending home glucose readings (scheduled w/pharmacy 03/22/21) -Congratulated patient on progress, emphasized the importance of ongoing medication adherence and lifestyle modifications -Plan to initiate statin at next visit

## 2021-02-25 NOTE — Assessment & Plan Note (Signed)
Improved from prior, still above goal. Last BMP Jan 2022 was wnl. -Continue Amlodipine 10mg  daily and Losartan 25mg  daily (no med adjustments today) -Sent Rx for home blood pressure cuff -Will consider increasing Losartan to 50mg  daily at next appt pending home BP readings

## 2021-02-25 NOTE — Patient Instructions (Addendum)
It was great to see you!  Your A1c and blood pressure are much improved!! We still have some work to do, but we're on the right track. Keep taking your medications as prescribed.  Please pick up your glucometer and testing supplies from the pharmacy.  Check a fasting sugar (first thing in the morning) and at least 1 postprandial sugar (~1 hr after eating) per day. Keep a log of your readings and bring it to your next appointment.  I have placed a referral to OB/GYN for removal of your IUD.  They should call you to arrange an appointment.  Take care and seek immediate care sooner if you develop any concerns.   Dr. [facility]

## 2021-02-25 NOTE — Assessment & Plan Note (Signed)
TSH 11.1 in January 2022. Patient had ben off medication, but was re-started on Synthroid 157mcg daily at that time. Reports overall good compliance w/meds. -Recheck TSH today -Continue 166mcg Synthroid daily

## 2021-02-25 NOTE — Assessment & Plan Note (Addendum)
Mirena placed in 2014. Overdue for removal. Strings were not seen in 2018 but ultrasound confirmed location in uterus at that time. Patient declines exam today to check for strings (she is certain they are not visible). -Referral placed to OBGYN for possible surgical removal

## 2021-02-26 LAB — TSH: TSH: 4.19 u[IU]/mL (ref 0.450–4.500)

## 2021-03-22 ENCOUNTER — Ambulatory Visit: Payer: No Typology Code available for payment source | Admitting: Pharmacist

## 2021-04-16 ENCOUNTER — Other Ambulatory Visit: Payer: Self-pay

## 2021-04-16 ENCOUNTER — Emergency Department (HOSPITAL_COMMUNITY)
Admission: EM | Admit: 2021-04-16 | Discharge: 2021-04-16 | Disposition: A | Discharge status: Home / Self Care | Payer: No Typology Code available for payment source | Attending: Emergency Medicine | Admitting: Emergency Medicine

## 2021-04-16 ENCOUNTER — Encounter (HOSPITAL_COMMUNITY): Payer: Self-pay | Admitting: Emergency Medicine

## 2021-04-16 ENCOUNTER — Emergency Department (HOSPITAL_COMMUNITY): Payer: No Typology Code available for payment source

## 2021-04-16 DIAGNOSIS — Z79899 Other long term (current) drug therapy: Secondary | ICD-10-CM | POA: Insufficient documentation

## 2021-04-16 DIAGNOSIS — E039 Hypothyroidism, unspecified: Secondary | ICD-10-CM | POA: Diagnosis not present

## 2021-04-16 DIAGNOSIS — I10 Essential (primary) hypertension: Secondary | ICD-10-CM | POA: Insufficient documentation

## 2021-04-16 DIAGNOSIS — J45909 Unspecified asthma, uncomplicated: Secondary | ICD-10-CM | POA: Insufficient documentation

## 2021-04-16 DIAGNOSIS — Z87891 Personal history of nicotine dependence: Secondary | ICD-10-CM | POA: Diagnosis not present

## 2021-04-16 DIAGNOSIS — E119 Type 2 diabetes mellitus without complications: Secondary | ICD-10-CM | POA: Insufficient documentation

## 2021-04-16 DIAGNOSIS — U071 COVID-19: Secondary | ICD-10-CM | POA: Diagnosis not present

## 2021-04-16 DIAGNOSIS — Z28310 Unvaccinated for covid-19: Secondary | ICD-10-CM | POA: Diagnosis not present

## 2021-04-16 DIAGNOSIS — R0602 Shortness of breath: Secondary | ICD-10-CM | POA: Diagnosis not present

## 2021-04-16 DIAGNOSIS — R509 Fever, unspecified: Secondary | ICD-10-CM | POA: Diagnosis present

## 2021-04-16 MED ORDER — DICYCLOMINE HCL 10 MG PO CAPS
10.0000 mg | ORAL_CAPSULE | Freq: Once | ORAL | Status: AC
  Administered 2021-04-16: 10 mg via ORAL
  Filled 2021-04-16: qty 1

## 2021-04-16 MED ORDER — NIRMATRELVIR/RITONAVIR (PAXLOVID)TABLET
3.0000 | ORAL_TABLET | Freq: Two times a day (BID) | ORAL | 0 refills | Status: AC
  Filled 2021-04-16: qty 30, 5d supply, fill #0

## 2021-04-16 NOTE — Discharge Instructions (Addendum)
You were  seen in the ER today for your shortness of breath in the context of your known COVID-19 infection.  Your physical exam and vital signs were very reassuring as was your chest x-ray, there is no pneumonia.  Your symptoms are secondary to your COVID-19 infection.  As you are within the first 5 days of your symptoms, you are eligible for oral treatment for COVID-19 infection.  You have been prescribed a combination of medications to treat your COVID-19 infection.  Please return to the emergency department if you develop any worsening shortness of breath, chest pain, palpitations, nausea or vomiting does not stop, or any other new severe symptoms.

## 2021-04-16 NOTE — ED Triage Notes (Signed)
Tuesday the patient began to feel weak, tired, fever and had chills. She says she had the "worst headache ever". She found out she was positive on Wednesday. Today she noticed that she had shortness of breath with exertion.

## 2021-04-16 NOTE — ED Notes (Signed)
Patient ambulated in the room. O2 sats remained between 99-100% and respiratory rate remained in the lower to mid twenties. Patient did complain of lightheadedness toward the end.

## 2021-04-16 NOTE — ED Provider Notes (Signed)
[facility] DEPT Provider Note   CSN: [id number] Arrival date & time: 04/16/21  1943     History Chief Complaint  Patient presents with  . Weakness  . Chills  . Fever  . Shortness of Breath  . Fatigue  . Covid Positive    [name] is a 40 y.o. female who tested positive for COVID-19 on 04/14/2021, the first day of her symptoms.  She presents the emergency department for shortness of breath.  Patient symptoms initially were myalgias, fevers, and fatigue.  She gradually progressed to have worsening intermittent fevers, severe headache, and  gradually worsening shortness of breath with exertion.  Patient is not vaccinated against COVID-19.  I personally reviewed the patient medical records.  She has history of type 2 diabetes, hypertension, and asthma.  She has normal renal function on review of her EMR.   HPI     Past Medical History:  Diagnosis Date  . Asthma    seasonal, worse in spring. Uses inhaler during this time of year  . Benign essential HTN 01/19/2012  . Complication of anesthesia SLOW TO WAKE UP AFTER C SECTION  . Cough, persistent 07/27/2012  . DM II (diabetes mellitus, type II), controlled ([place]) 01/19/2012  . Eczema 01/19/2012  . History of Hodgkin's lymphoma 07/27/2012   Stage IIB dx 2/13 Rx ABVD X 4 then involved field Radiation   . Hodgkin lymphoma ([place]) 01/18/2012   left axilla & left neck dx'd 01/16/2012  . Hypertension   . Hypothyroidism    06/2014  . Leukopenia 08/01/2012  . Migraine   . Muscle spasm of back 03/09/2012  . Preventive measure 08/07/2014  . Proteinuria 01/10/2014  . S/P chemotherapy, time since 4-12 weeks 05/11/2012   4 cycles of ABVD with neulasta support and zoladex    Patient Active Problem List   Diagnosis Date Noted  . Major depressive disorder, recurrent episode, moderate ([place]) 12/29/2016  . IUD (intrauterine device) in place 12/27/2016  . Uncontrolled type 2 diabetes mellitus ([place]) 07/17/2015  .  Obesity, Class II, BMI 35-39.9, with comorbidity ([place]) 07/09/2014  . Other specified hypothyroidism 01/10/2014  . Murmur 10/17/2013  . Generalized anxiety disorder 09/16/2013  . Leukopenia 08/01/2012  . History of Hodgkin's lymphoma 07/27/2012  . Hypertension associated with diabetes ([place]) 01/19/2012  . Eczema 01/19/2012    Past Surgical History:  Procedure Laterality Date  . axillary lymph node biopsy  12/2011   left  . CESAREAN SECTION  2008  . DILATION AND CURETTAGE OF UTERUS     Early 2000s  . KNEE SURGERY Right 11/02/2016   [place]  . PORTACATH PLACEMENT  01/25/2012   Procedure: INSERTION PORT-A-CATH;  Surgeon: Gayland Curry, MD,FACS;  Location: [facility];  Service: General;  Laterality: Right;  right subclavian     OB History    Gravida  3   Para  1   Term  1   Preterm      AB  2   Living  1     SAB  2   IAB  0   Ectopic  0   Multiple  0   Live Births              Family History  Problem Relation Age of Onset  . Allergies Mother   . Ovarian cancer Mother 68       Also diagnosed with some blood cancer  . Diabetes Maternal Grandmother   . Heart disease Maternal Grandfather  Social History   Tobacco Use  . Smoking status: Former Smoker    Packs/day: 0.25    Years: 5.00    Pack years: 1.25    Types: Cigarettes    Quit date: 11/28/2002    Years since quitting: 18.3  . Smokeless tobacco: Never Used  . Tobacco comment: Quit ?2004-2005  Substance Use Topics  . Alcohol use: No    Alcohol/week: 0.0 standard drinks  . Drug use: No    Home Medications Prior to Admission medications   Medication Sig Start Date End Date Taking? Authorizing Provider  nirmatrelvir/ritonavir EUA (PAXLOVID) TABS Take 3 tablets by mouth 2 (two) times daily for 5 days. Patient GFR is normal on most recent labs. Take nirmatrelvir (150 mg) two tablets twice daily for 5 days and ritonavir (100 mg) one tablet twice daily for 5 days. 04/16/21 04/21/21 Yes Jadriel Saxer,  Gypsy Balsam, PA-C  acetaminophen (TYLENOL) 500 MG tablet Take 500 mg by mouth every 6 (six) hours as needed for mild pain or headache. Patient not taking: No sig reported    [provider]  amLODipine (NORVASC) 10 MG tablet Take 1 tablet (10 mg total) by mouth at bedtime. 04/08/20   Kathrene Alu, MD  Blood Glucose Monitoring Suppl (ONETOUCH VERIO) w/Device KIT Please use to check blood sugar three times daily. E11.65 02/19/21   Alcus Dad, MD  Blood Pressure Monitoring (BLOOD PRESSURE CUFF) MISC Please provide whichever cuff is covered by patient's insurance 02/25/21   Alcus Dad, MD  dapagliflozin propanediol (FARXIGA) 5 MG TABS tablet Take 5 mg by mouth daily. 04/08/20   Kathrene Alu, MD  gabapentin (NEURONTIN) 100 MG capsule Take 1-3 capsules (100-300 mg total) by mouth at bedtime. Patient not taking: No sig reported 07/12/19   Kathrene Alu, MD  glucose blood (ONETOUCH VERIO) test strip Please use to check blood sugar up to three times daily. E11.65 02/19/21   Alcus Dad, MD  ibuprofen (ADVIL,MOTRIN) 800 MG tablet Take 1 tablet (800 mg total) by mouth 3 (three) times daily. 08/27/16   Domenic Moras, PA-C  Lancet Devices (ONE TOUCH DELICA LANCING DEV) MISC Please use to check blood sugar up to three times daily. 02/19/21   Alcus Dad, MD  levothyroxine (SYNTHROID) 100 MCG tablet Take 1 tablet (100 mcg total) by mouth daily. 01/06/21   Alcus Dad, MD  losartan (COZAAR) 25 MG tablet Take 1 tablet (25 mg total) by mouth at bedtime. 02/02/21   Alcus Dad, MD  Misc Natural Products (ELDERBERRY ZINC/VIT C/IMMUNE MT) Use as directed 1 tablet in the mouth or throat daily.    [provider]  OneTouch Delica Lancets 82N MISC Please use to check blood sugar up to three times daily. E11.65 02/19/21   Alcus Dad, MD  Semaglutide,0.25 or 0.5MG/DOS, (OZEMPIC, 0.25 OR 0.5 MG/DOSE,) 2 MG/1.5ML SOPN Inject 0.5 mg into the skin once a week. 02/02/21   Alcus Dad, MD    Allergies    Imitrex [sumatriptan], Metformin and related, and Penicillins  Review of Systems   Review of Systems  Constitutional: Positive for activity change, appetite change, chills, fatigue and fever. Negative for diaphoresis and unexpected weight change.  HENT: Negative.  Negative for congestion, rhinorrhea, sinus pressure, sinus pain, sneezing, sore throat, tinnitus, trouble swallowing and voice change.   Respiratory: Positive for chest tightness and shortness of breath. Negative for wheezing.   Cardiovascular: Negative for chest pain, palpitations and leg swelling.  Gastrointestinal: Negative.   Musculoskeletal: Positive for  arthralgias and myalgias.  Skin: Negative.   Neurological: Positive for light-headedness and headaches. Negative for dizziness, tremors, syncope and weakness.    Physical Exam Updated Vital Signs BP (!) 137/99   Pulse (!) 104   Temp 99.2 F (37.3 C) (Oral)   Resp 16   Ht 5' 7.5" (1.715 m)   Wt 99.8 kg   SpO2 98%   BMI 33.95 kg/m   Physical Exam Vitals and nursing note reviewed.  Constitutional:      Appearance: She is obese. She is not toxic-appearing.  HENT:     Head: Normocephalic and atraumatic.     Nose: Nose normal.     Mouth/Throat:     Mouth: Mucous membranes are moist.     Pharynx: Oropharynx is clear. Uvula midline. No oropharyngeal exudate, posterior oropharyngeal erythema or uvula swelling.     Tonsils: No tonsillar exudate.  Eyes:     General: Lids are normal. Vision grossly intact.        Right eye: No discharge.        Left eye: No discharge.     Extraocular Movements: Extraocular movements intact.     Conjunctiva/sclera: Conjunctivae normal.     Pupils: Pupils are equal, round, and reactive to light.  Neck:     Trachea: Trachea and phonation normal.  Cardiovascular:     Rate and Rhythm: Normal rate and regular rhythm.     Pulses: Normal pulses.     Heart sounds: Normal heart sounds. No murmur  heard.   Pulmonary:     Effort: Pulmonary effort is normal. No tachypnea, bradypnea, accessory muscle usage, prolonged expiration or respiratory distress.     Breath sounds: Normal breath sounds. No decreased breath sounds, wheezing or rales.  Chest:     Chest wall: No mass, deformity, tenderness, crepitus or edema.  Abdominal:     General: Bowel sounds are normal. There is no distension.     Palpations: Abdomen is soft.     Tenderness: There is no abdominal tenderness.  Musculoskeletal:        General: No deformity. Normal range of motion.     Cervical back: Normal range of motion and neck supple. No edema, rigidity or crepitus. No pain with movement, spinous process tenderness or muscular tenderness.     Right lower leg: No edema.     Left lower leg: No edema.  Lymphadenopathy:     Cervical: No cervical adenopathy.  Skin:    General: Skin is warm and dry.     Capillary Refill: Capillary refill takes less than 2 seconds.     Findings: No rash.  Neurological:     General: No focal deficit present.     Mental Status: She is alert and oriented to person, place, and time. Mental status is at baseline.  Psychiatric:        Mood and Affect: Mood normal.     ED Results / Procedures / Treatments   Labs (all labs ordered are listed, but only abnormal results are displayed) Labs Reviewed - No data to display  EKG None  Radiology No results found.  Procedures Procedures   Medications Ordered in ED Medications  dicyclomine (BENTYL) capsule 10 mg (10 mg Oral Given 04/16/21 2317)    ED Course  I have reviewed the triage vital signs and the nursing notes.  Pertinent labs & imaging results that were available during my care of the patient were reviewed by me and considered in my medical decision  making (see chart for details).    MDM Rules/Calculators/A&P                          40 year old female with COVID-19 infection who presents with concern for shortness of breath  with exertion.  Patient is hypertensive on intake, vital signs otherwise normal.  Cardiopulmonary exam is normal.  Abdominal exam is benign.  Patient is neurovascularly intact in all 4 extremities.  Given shortness of breath we will proceed with chest x-ray.  She is PERC negative at time of initial exam.  Chest x-ray negative for acute cardiopulmonary disease.  Patient was ambulated with maintenance of her oxygen saturation greater than 95% on room air.  Additionally there was  orthostasis on orthostatic vital signs.  Patient was encouraged to increase hydration at home, as she did not wish to remain in the ED for IV fluid resuscitation.  Suspect patient symptoms are secondary to mild dehydration as well as underlying COVID-19 infection.  PE unlikely given improvement in tachycardia prior to departure and early COVID-19 course.  No pneumonia or pleural effusion visualized on chest x-ray.  Discussed at length role for outpatient oral treatment for COVID-19.  Patient expressed understanding of her treatment options and voiced wishes to proceed with oral antiviral therapy.  Paxlovid prescription placed.  No further work-up warranted in ED at this time.  [place] voiced understanding of her medical evaluation and treatment plan.  Each of her questions was answered to her expressed satisfaction.  Return precautions are given.  Patient is stable and appropriate for discharge at this time.  SHATARRA WEHLING was evaluated in Emergency Department on 04/16/2021 for the symptoms described in the history of present illness. She was evaluated in the context of the global COVID-19 pandemic, which necessitated consideration that the patient might be at risk for infection with the SARS-CoV-2 virus that causes COVID-19. Institutional protocols and algorithms that pertain to the evaluation of patients at risk for COVID-19 are in a state of rapid change based on information released by regulatory bodies including the CDC  and federal and state organizations. These policies and algorithms were followed during the patient's care in the ED.  This chart was dictated using voice recognition software, Dragon. Despite the best efforts of this provider to proofread and correct errors, errors may still occur which can change documentation meaning.  Final Clinical Impression(s) / ED Diagnoses Final diagnoses:  APOLI-10    Rx / DC Orders ED Discharge Orders         Ordered    nirmatrelvir/ritonavir EUA (PAXLOVID) TABS  2 times daily        04/16/21 2303           Lyrica Mcclarty, Gypsy Balsam, PA-C 04/16/21 2351    Drenda Freeze, MD 04/17/21 [phone]

## 2021-04-17 ENCOUNTER — Other Ambulatory Visit (HOSPITAL_COMMUNITY): Payer: Self-pay

## 2021-05-05 ENCOUNTER — Encounter: Payer: No Typology Code available for payment source | Admitting: Family Medicine

## 2021-05-05 ENCOUNTER — Encounter: Payer: Self-pay | Admitting: Family Medicine

## 2021-05-05 NOTE — Progress Notes (Signed)
Patient did not keep appointment today. She may call to reschedule.  

## 2021-05-12 NOTE — Progress Notes (Signed)
    SUBJECTIVE:   CHIEF COMPLAINT / HPI:   Diabetic Follow Up: Patient is a 40 y.o. female who presents today for diabetic follow up.   Home medications include: Farxiga 5mg  daily and Ozempic 0.5mg  weekly. Patient endorses taking these medications as prescribed.  Patient was previously checking blood glucose on a regular basis in April. Has not been checking recently. Brought her home glucose log today: range 110-482. Majority of readings 110s-180s.  Most recent A1Cs:  Lab Results  Component Value Date   HGBA1C 9.0 (A) 02/25/2021   HGBA1C 12.8 (A) 12/04/2020   HGBA1C 11.6 (A) 04/08/2020   Patient is up to date on diabetic eye exam. Patient is up to date on diabetic foot exam.  HTN follow-up On Amlodipine 10mg  daily and Losartan 25mg  daily. Reports taking these medications as prescribed. Does not check BP at home. Last BMP 12/04/2020 wnl.  PERTINENT  PMH / PSH: T2DM, HTN, hypothyroidism, hodgkin's lymphoma   OBJECTIVE:   BP (!) 150/90   Pulse 100   Ht 5' 7.5" (1.715 m)   Wt 229 lb (103.9 kg)   BMI 35.34 kg/m   General: NAD, pleasant, able to participate in exam Respiratory: No respiratory distress Skin: warm and dry, no rashes noted Psych: Normal affect and mood Neuro: grossly intact  ASSESSMENT/PLAN:   Uncontrolled type 2 diabetes mellitus ([place]) Well controlled currently. A1c 6.8 today, improved from 12.8 six months ago. Congratulated patient on her efforts. Continue Farxiga 5mg  daily. Will increase Ozempic to 1mg  weekly to help with weight management (BMI 35).    Hypertension associated with diabetes ([place]) BP elevated to 150/90 today. Remains elevated above goal on recheck. -Increase Losartan to 50mg  daily -Continue Amlodipine 10mg  daily  IUD Management Mirena placed in 2014. Patient overdue for removal. She missed appt with OBGYN on 05/05/2021. Patient provided number in AVS and instructed to call to reschedule ASAP. Counseled on using alternate form of  contraception.   Alcus Dad, MD [place]

## 2021-05-13 ENCOUNTER — Other Ambulatory Visit: Payer: Self-pay

## 2021-05-13 ENCOUNTER — Encounter: Payer: Self-pay | Admitting: Family Medicine

## 2021-05-13 ENCOUNTER — Ambulatory Visit (INDEPENDENT_AMBULATORY_CARE_PROVIDER_SITE_OTHER): Payer: No Typology Code available for payment source | Admitting: Family Medicine

## 2021-05-13 VITALS — BP 150/90 | HR 100 | Ht 67.5 in | Wt 229.0 lb

## 2021-05-13 DIAGNOSIS — I152 Hypertension secondary to endocrine disorders: Secondary | ICD-10-CM | POA: Diagnosis not present

## 2021-05-13 DIAGNOSIS — E1165 Type 2 diabetes mellitus with hyperglycemia: Secondary | ICD-10-CM | POA: Diagnosis not present

## 2021-05-13 DIAGNOSIS — E1159 Type 2 diabetes mellitus with other circulatory complications: Secondary | ICD-10-CM | POA: Diagnosis not present

## 2021-05-13 LAB — POCT GLYCOSYLATED HEMOGLOBIN (HGB A1C): HbA1c, POC (controlled diabetic range): 6.8 % (ref 0.0–7.0)

## 2021-05-13 MED ORDER — LOSARTAN POTASSIUM 50 MG PO TABS: 50.0000 mg | ORAL_TABLET | Freq: Every day | ORAL | 3 refills | Status: DC

## 2021-05-13 MED ORDER — OZEMPIC (1 MG/DOSE) 4 MG/3ML ~~LOC~~ SOPN: 1.0000 mg | PEN_INJECTOR | SUBCUTANEOUS | 5 refills | Status: DC

## 2021-05-13 MED ORDER — DAPAGLIFLOZIN PROPANEDIOL 5 MG PO TABS: 5.0000 mg | ORAL_TABLET | Freq: Every day | ORAL | 3 refills | Status: DC

## 2021-05-13 NOTE — Patient Instructions (Addendum)
It was great to see you! I am so proud of you! Keep taking all your medications as prescribed.  Things we discussed at today's visit: - Please continue to log your glucose readings. Ideally every morning when you take your medications and at least once daily after a meal. -We are increasing your Ozempic to 1mg  weekly - I have provided refills on your [place] (5mg  daily) - Please call your OBGYN to reschedule your appointment ASAP  [facility]N: [facility] [phone]  Take care and seek immediate care sooner if you develop any concerns.  Dr. [facility]  Your medication regimen: Every morning: -Farxiga 5mg  and Synthroid 122mcg Every evening: -Losartan 50mg  and Amlodipine 10mg  Every Monday: -Ozempic

## 2021-05-14 NOTE — Assessment & Plan Note (Signed)
BP elevated to 150/90 today. Remains elevated above goal on recheck. -Increase Losartan to 50mg  daily -Continue Amlodipine 10mg  daily

## 2021-05-14 NOTE — Assessment & Plan Note (Signed)
Well controlled currently. A1c 6.8 today, improved from 12.8 six months ago. Congratulated patient on her efforts. Continue Farxiga 5mg  daily. Will increase Ozempic to 1mg  weekly to help with weight management (BMI 35).

## 2021-05-17 ENCOUNTER — Telehealth: Payer: Self-pay

## 2021-05-17 NOTE — Telephone Encounter (Signed)
Received fax from pharmacy, PA needed on Farxiga.  Clinical questions submitted via Cover My Meds.    Cover My Meds info: Key: [place] with approval.    Talbot Grumbling, RN

## 2021-08-11 ENCOUNTER — Ambulatory Visit (INDEPENDENT_AMBULATORY_CARE_PROVIDER_SITE_OTHER): Payer: No Typology Code available for payment source | Admitting: Family Medicine

## 2021-08-11 ENCOUNTER — Other Ambulatory Visit: Payer: Self-pay

## 2021-08-11 ENCOUNTER — Other Ambulatory Visit: Payer: Self-pay | Admitting: Family Medicine

## 2021-08-11 VITALS — BP 156/108 | Temp 99.8°F | Ht 67.0 in

## 2021-08-11 DIAGNOSIS — E1159 Type 2 diabetes mellitus with other circulatory complications: Secondary | ICD-10-CM

## 2021-08-11 DIAGNOSIS — I152 Hypertension secondary to endocrine disorders: Secondary | ICD-10-CM | POA: Diagnosis not present

## 2021-08-11 DIAGNOSIS — J988 Other specified respiratory disorders: Secondary | ICD-10-CM | POA: Diagnosis not present

## 2021-08-11 DIAGNOSIS — B9789 Other viral agents as the cause of diseases classified elsewhere: Secondary | ICD-10-CM | POA: Diagnosis not present

## 2021-08-11 NOTE — Assessment & Plan Note (Addendum)
BP not at goal. 156/108. Uncontrolled hypertension likely present in setting of acute illness. Taking her anti-hypertensive medications as prescribed but has been using OTC cough/cold medications. Has follow up appt scheduled with PCP in 1 week.

## 2021-08-11 NOTE — Progress Notes (Addendum)
    SUBJECTIVE:   CHIEF COMPLAINT / HPI:   [name] presents for 8 day history of sore throat and headache.  Got sick last Tuesday with body chills, headache, sore throat and elevated temperature (100.2 F). Temperature resolved Wednesday and has been low on daily temperature checks. She took two COVID tests at home which were negative. Has had a headache for 5 straight days and has continuing to have a sore throat, which has been the most bothersome of all her symptoms.  Developed a cough on 9/9 along with chest discomfort. She describes the chest discomfort as achy, and has been feeling winded as though she needs to breathe heavier. Her symptoms remind her of when she had had pneumonia. Congestion has been on and off, often flaring at night. She has had a wet cough that produces yellow phlegm. She has had poor appetite and her stomach has been feeling off as a result. Denies nausea, vomiting, diarrhea, constipation, pain with urination and fever. Had multiple negative at-home COVID over the past week, but has not had a PCR test. She is not vaccinated for COVID-19. She does not smoke.   She has tried Dayquil, Tylenol severe cold and flu, Mucinex PM, cough suppressants, herbal remedies (elderberry tea) which have had limited effect in relieving symptoms.  She has seasonal allergies. She has not used any medications for allergies.   PERTINENT  PMH / PSH:  Hypertension Diabetes   OBJECTIVE:   BP (!) 156/108   Temp 99.8 F (37.7 C)   Ht '5\' 7"'$  (1.702 m)   SpO2 95%   BMI 35.87 kg/m   Gen: Ill appearing, in no acute distress Ears: Slight erythema in ear canal, tympanic membranes clear and white Nose: Slight swelling of turbinates, occasional drainage Eyes: Erythema on left conjunctiva>rightNeck: Normal ROM, <1 cm anterior lymphadenopathy  Pulm: Clear to auscultation bilaterally, normal work of breathing Card: Normal rate and rhythm, no murmurs or gallops Skin: no rash visible on exposed  skin   ASSESSMENT/PLAN:   Hypertension associated with diabetes ([facility]) BP not at goal. 156/108. Uncontrolled hypertension likely present in setting of acute illness. Taking her anti-hypertensive medications as prescribed but has been using OTC cough/cold medications. Has follow up appt scheduled with PCP in 1 week.   Viral Respiratory Illness  History and presentation is concerning for COVID-19 infection given constellation of symptoms and being unvaccinated for COVID-19. Adequate (95%) O2 sats in office. Afebrile today but has been taking Tylenol containing OTC cold/sinus medications. Counseled [name] on options for symptom relief and advised patient to quarantine. Have provided work note to cover her for presumed COVID quarantine and also allow her adequate time to rest & recover. - Symptomatic treatment discussed  - ED and return precautions given      [place] NOTE    I personally evaluated this patient along with the student, and verified all aspects of the history, physical exam, and medical decision making as documented by the student. I agree with the student's documentation and have made all necessary edits.  Lyndee Hensen, DO PGY-3, [facility] 08/11/2021

## 2021-08-11 NOTE — Patient Instructions (Addendum)
You likely have COVID-19 or another respiratory virus. Your COVID test is pending. If positive, quarantine for 5 days. You may return to work after that with an N95 mask. If negative, you likely have another common virus.   Rest, hydrate and use Afrin (over the counter) as needed for no more than 3 days. If you find your congestion is worsening, try using a dehumidifier.  For your sore throat, drink warm drinks like tea with honey to help soothe the throat. Can use chloraseptic spray PRN. Gargle with warm salt water.   We have printed a work note, clearing you to go back to work on 08/16/21.  If your symptoms do not improve by 08/18/21, schedule an appointment with [place].  We hope you feel better soon,  Be Well, Dr. Susa Simmonds

## 2021-08-12 LAB — NOVEL CORONAVIRUS, NAA: SARS-CoV-2, NAA: NOT DETECTED

## 2021-08-12 LAB — SARS-COV-2, NAA 2 DAY TAT

## 2021-08-17 ENCOUNTER — Telehealth (INDEPENDENT_AMBULATORY_CARE_PROVIDER_SITE_OTHER): Payer: No Typology Code available for payment source | Admitting: Family Medicine

## 2021-08-17 DIAGNOSIS — B349 Viral infection, unspecified: Secondary | ICD-10-CM | POA: Insufficient documentation

## 2021-08-17 MED ORDER — IBUPROFEN 600 MG PO TABS: 600.0000 mg | ORAL_TABLET | Freq: Three times a day (TID) | ORAL | 0 refills | Status: DC | PRN

## 2021-08-17 MED ORDER — BENZONATATE 200 MG PO CAPS: 200.0000 mg | ORAL_CAPSULE | Freq: Three times a day (TID) | ORAL | 0 refills | Status: DC | PRN

## 2021-08-17 MED ORDER — ACETAMINOPHEN 500 MG PO TABS: 1000.0000 mg | ORAL_TABLET | Freq: Four times a day (QID) | ORAL | 0 refills | Status: AC

## 2021-08-17 NOTE — Assessment & Plan Note (Signed)
Symptoms consistent with general viral infection, improving from last week. Covid negative last week. Influenza unlikely and would be out of the window for tamiflu. Considered sinusitis and otitis media however less likely. Pt still has ear ache and sore throat, however reassuring that she afebrile and is improving from last week. Explained limitations of telemedicine visit and that I am unable to do physical exam. Recommended continued supportive care ie hydration, analgesia-sent in ibuprofen 600mg  Q8H and tylenol 1000mg  Q6H and tessalon pearls to pharmacy. Return precautions given to pt. If persistent symptoms then she should follow up with [facility] next week.

## 2021-08-17 NOTE — Progress Notes (Signed)
[facility] Telemedicine Visit  Patient consented to have virtual visit and was identified by name and date of birth. Method of visit: Video was attempted but was interrupted: <50% of visit completed via video  Encounter participants: Patient: [name] - located at home  Provider: Lattie Haw - located at home Others (if applicable):   Chief Complaint:   HPI:  Ear ache and sore throat Pt reports a 2 week history of low grade fever, sore throat, headache, body aches, cough etc. She is better now however she is now having an ear ache in right ear and sore throat. Headache comes and goes. Denies fevers currently. Denies drainage, external ear pain. Pt is unvaccinated for covid. Has been taking ear drops (for ear pain relief), chlorseptic spray, tylenol, cold/flu medications, alleave.   ROS: per HPI  Pertinent PMHx: DM, HTN, eczema   Exam:  There were no vitals taken for this visit.  Respiratory: speaking in full sentences   Assessment/Plan:  Viral infection Symptoms consistent with general viral infection, improving from last week. Covid negative last week. Influenza unlikely and would be out of the window for tamiflu. Considered sinusitis and otitis media however less likely. Pt still has ear ache and sore throat, however reassuring that she afebrile and is improving from last week. Explained limitations of telemedicine visit and that I am unable to do physical exam. Recommended continued supportive care ie hydration, analgesia-sent in ibuprofen 600mg  Q8H and tylenol 1000mg  Q6H and tessalon pearls to pharmacy. Return precautions given to pt. If persistent symptoms then she should follow up with [facility] next week.    Time spent during visit with patient: 14 minutes

## 2021-09-14 ENCOUNTER — Encounter: Payer: Self-pay | Admitting: Family Medicine

## 2021-09-14 ENCOUNTER — Ambulatory Visit (INDEPENDENT_AMBULATORY_CARE_PROVIDER_SITE_OTHER): Payer: No Typology Code available for payment source | Admitting: Family Medicine

## 2021-09-14 ENCOUNTER — Other Ambulatory Visit: Payer: Self-pay

## 2021-09-14 VITALS — BP 167/107 | HR 101 | Wt 211.0 lb

## 2021-09-14 DIAGNOSIS — I152 Hypertension secondary to endocrine disorders: Secondary | ICD-10-CM

## 2021-09-14 DIAGNOSIS — E1159 Type 2 diabetes mellitus with other circulatory complications: Secondary | ICD-10-CM

## 2021-09-14 DIAGNOSIS — E038 Other specified hypothyroidism: Secondary | ICD-10-CM | POA: Diagnosis not present

## 2021-09-14 DIAGNOSIS — E119 Type 2 diabetes mellitus without complications: Secondary | ICD-10-CM

## 2021-09-14 DIAGNOSIS — Z975 Presence of (intrauterine) contraceptive device: Secondary | ICD-10-CM | POA: Diagnosis not present

## 2021-09-14 DIAGNOSIS — Z794 Long term (current) use of insulin: Secondary | ICD-10-CM

## 2021-09-14 DIAGNOSIS — Z23 Encounter for immunization: Secondary | ICD-10-CM

## 2021-09-14 LAB — POCT GLYCOSYLATED HEMOGLOBIN (HGB A1C): HbA1c, POC (controlled diabetic range): 5.9 % (ref 0.0–7.0)

## 2021-09-14 MED ORDER — DAPAGLIFLOZIN PROPANEDIOL 5 MG PO TABS: 5.0000 mg | ORAL_TABLET | Freq: Every day | ORAL | 3 refills | Status: DC

## 2021-09-14 MED ORDER — AMLODIPINE BESYLATE-VALSARTAN 10-160 MG PO TABS: 1.0000 | ORAL_TABLET | Freq: Every day | ORAL | 0 refills | Status: DC

## 2021-09-14 NOTE — Patient Instructions (Addendum)
It was great to see you!  Your A1c was 5.9% today, which is excellent! Continue taking your Wilder Glade daily and your Ozempic every week. I have sent refills on your [place]. Keep up the good work with the exercise and weight loss.  We are going to change your blood pressure medication to a combination pill. You should stop both your amlodipine and losartan and start the new medication instead. It is called Exforge. Take one pill daily.   [facility] as soon as possible to make an appointment for your IUD to be removed. Their phone # is [phone]. Remember to use an alternate form of birth control in the meantime.   Do not cut your toenails super short. Try to cut them straight across. This can help prevent ingrown toenails.  We are checking some labs. We will send you a MyChart message with the results or call if they are abnormal.   Take care and seek immediate care sooner if you develop any concerns.  Dr. [facility]

## 2021-09-14 NOTE — Progress Notes (Signed)
    SUBJECTIVE:   CHIEF COMPLAINT / HPI:   Diabetes  Last A1c 6.8% on 05/13/2021. Prior to that had been poorly controlled (A1c >12% for years).  Home medications include: Farxiga 5mg  daily and Ozempic 1mg  weekly. Reports excellent compliance with the Ozempic. Has been out of her Wilder Glade for ~3 weeks but before running out she was very compliant. States she has been exercising regularly and doing better with her diet.  No symptoms of hypoglycemia. Not checking CBG regularly at home.  HTN Current meds: Amlodipine 10mg  and Losartan 50mg  daily. Reports good compliance. No headaches, vision changes, or other symptoms. Does not check BP at home.  Ingrown Toenail 2 months ago had a pedicure-- reports she feels like her L big toenail has been growing inward since then. No significant pain, redness, or other issues.  PERTINENT  PMH / PSH: T2DM, HTN, Hodgkin's lymphoma  OBJECTIVE:   BP (!) 167/107   Pulse (!) 101   Wt 211 lb (95.7 kg)   SpO2 100%   BMI 33.05 kg/m   General: NAD, pleasant, able to participate in exam Respiratory: No respiratory distress Skin: warm and dry, no rashes noted Psych: Normal affect and mood Neuro: grossly intact Toes: No obvious abnormality on visual inspection. No tenderness over medial or lateral nail bed of great toenails bilaterally.    ASSESSMENT/PLAN:   Hypertension associated with diabetes ([place]) Poorly controlled. BP elevated to 167/107 today. Repeat BP similar. Current meds:  Amlodipine 10mg  and losartan 50mg  daily. Compliance has historically been an issue for her. -Change to combo pill (Exforge): Amlodipine-Valsartan 10-160mg , which will be an increase in her ARB dosing -Follow-up in 2 months, consider adding HCTZ at that time if BP persistently elevated -Check BMP today   Type 2 diabetes mellitus without complication, without long-term current use of insulin ([facility]) Well-controlled currently. A1c 5.9% today (goal A1c <7.0%). Congratulated  patient as she had previously had A1c >12% for years. -Continue current regimen (Farxiga 5mg  daily, Ozempic 1mg  weekly). Farxiga refills sent -Continue ARB -Not on statin (med regimen simplified in the past due to compliance issues). Obtain lipid panel today. Plan to start statin at next appointment -UTD on foot and eye exams  Other specified hypothyroidism Last TSH 4.190 on 02/25/2021. Takes synthroid 179mcg although admits she has not been very adherent recently. She denies symptoms of hypothyroidism. -Check TSH today   Health Maintenance Flu vaccine given today  Alcus Dad, MD [place]

## 2021-09-15 LAB — BASIC METABOLIC PANEL
BUN/Creatinine Ratio: 12 (ref 9–23)
BUN: 12 mg/dL (ref 6–20)
CO2: 25 mmol/L (ref 20–29)
Calcium: 9.4 mg/dL (ref 8.7–10.2)
Chloride: 102 mmol/L (ref 96–106)
Creatinine, Ser: 0.99 mg/dL (ref 0.57–1.00)
Glucose: 100 mg/dL — ABNORMAL HIGH (ref 70–99)
Potassium: 4.7 mmol/L (ref 3.5–5.2)
Sodium: 138 mmol/L (ref 134–144)
eGFR: 75 mL/min/{1.73_m2} (ref >59)

## 2021-09-15 LAB — TSH: TSH: 6.78 u[IU]/mL — ABNORMAL HIGH (ref 0.450–4.500)

## 2021-09-15 LAB — LIPID PANEL
Chol/HDL Ratio: 2.7 ratio (ref 0.0–4.4)
Cholesterol, Total: 253 mg/dL — ABNORMAL HIGH (ref 100–199)
HDL: 95 mg/dL (ref >39)
LDL Chol Calc (NIH): 141 mg/dL — ABNORMAL HIGH (ref 0–99)
Triglycerides: 103 mg/dL (ref 0–149)
VLDL Cholesterol Cal: 17 mg/dL (ref 5–40)

## 2021-09-17 ENCOUNTER — Encounter: Payer: Self-pay | Admitting: Family Medicine

## 2021-09-17 NOTE — Assessment & Plan Note (Signed)
Mirena placed in 2014. Due for removal. Reminded patient to contact OBGYN about this and provided info in AVS.

## 2021-09-17 NOTE — Assessment & Plan Note (Addendum)
Last TSH 4.190 on 02/25/2021. Takes synthroid 132mcg although admits she has not been very adherent recently. She denies symptoms of hypothyroidism. -Check TSH today

## 2021-09-17 NOTE — Assessment & Plan Note (Signed)
Well-controlled currently. A1c 5.9% today (goal A1c <7.0%). Congratulated patient as she had previously had A1c >12% for years. -Continue current regimen (Farxiga 5mg  daily, Ozempic 1mg  weekly). Farxiga refills sent -Continue ARB -Not on statin (med regimen simplified in the past due to compliance issues). Obtain lipid panel today. Plan to start statin at next appointment -UTD on foot and eye exams

## 2021-09-17 NOTE — Assessment & Plan Note (Signed)
Poorly controlled. BP elevated to 167/107 today. Repeat BP similar. Current meds:  Amlodipine 10mg  and losartan 50mg  daily. Compliance has historically been an issue for her. -Change to combo pill (Exforge): Amlodipine-Valsartan 10-160mg , which will be an increase in her ARB dosing -Follow-up in 2 months, consider adding HCTZ at that time if BP persistently elevated -Check BMP today

## 2021-11-09 NOTE — Patient Instructions (Addendum)
It was wonderful to see you today.  Please bring ALL of your medications with you to every visit.   Today we talked about:  -I am sending in a medication called Lasmiditan. Take 200 mg once today. Do not repeat in 24 hour period. This should stop your migraine. -I am sending a prescription for Propranolol to help prevent future migraines. Take 80 mg once daily at bedtime.  -Check your blood pressures at home and keep a log.  -Return if your migraines change, if they become worse, if you become weak or have troubles walking, or for any concerning symptoms to you. -Try to avoid using medication for all headaches as this can cause rebound headaches if taken for over 10 days a month.   Thank you for choosing [facility].   Please call [phone] with any questions about today's appointment.  Please be sure to schedule follow up at the front  desk before you leave today.   Sharion Settler, DO PGY-2 Family Medicine

## 2021-11-09 NOTE — Progress Notes (Signed)
SUBJECTIVE:   CHIEF COMPLAINT / HPI:   Headache [name] is a 40 y.o. female who presents to the clinic to discuss ongoing headaches since Sunday (12/11).  Describes it as pulsatile and pressure-like sensation on the left side of her face behind her eye.  States that she has a history of migraines and cluster migraines.  This feels similar to her migraines except that it is lasting longer than her usual migraines which last 3 days typically.  Has tried Tylenol, Excedrin, Advil, Advil sinus, BC powder, cold compresses, and sleep for this without  relief.  Caffeine intake is minimal.  Reports slight photo phobia and nausea. Feels that her vision is slightly blurred "it's hard to focus". Has left-sided neck tension as well. Denies phonophobia, vomiting, sinus symptoms, fever,  focal neurologic complaints.    Reports that she has made great efforts to lose weight.  She is eating cleaner, and also going to the gym 3x/week.   PERTINENT  PMH / PSH:  Past Medical History:  Diagnosis Date   Asthma    seasonal, worse in spring. Uses inhaler during this time of year   Benign essential HTN 07/21/2352   Complication of anesthesia SLOW TO WAKE UP AFTER C SECTION   Cough, persistent 07/27/2012   DM II (diabetes mellitus, type II), controlled ([place]) 01/19/2012   Eczema 01/19/2012   History of Hodgkin's lymphoma 07/27/2012   Stage IIB dx 2/13 Rx ABVD X 4 then involved field Radiation    Hodgkin lymphoma ([place]) 01/18/2012   left axilla & left neck dx'd 01/16/2012   Hypertension    Hypothyroidism    06/2014   Leukopenia 08/01/2012   Migraine    Muscle spasm of back 03/09/2012   Preventive measure 08/07/2014   Proteinuria 01/10/2014   S/P chemotherapy, time since 4-12 weeks 05/11/2012   4 cycles of ABVD with neulasta support and zoladex   Uncontrolled type 2 diabetes mellitus 07/17/2015    OBJECTIVE:   BP (!) 142/106    Pulse 90    Ht 5\' 7"  (1.702 m)    Wt 204 lb 8 oz (92.8 kg)    SpO2 96%    BMI  32.03 kg/m    General: NAD, pleasant, able to participate in exam HEENT: EOMI. No temporal artery swelling or tenderness on examination. Slight tenderness on palpation to left maxillary sinus, TMJ without abnormalities.  Neck: No nuchal rigidity.  Has some tenderness to left cervical paraspinal muscles but no increased muscle tension. Cardiac: RRR Respiratory: CTAB, normal effort, No wheezes, rales or rhonchi Extremities: no edema or cyanosis. Skin: warm and dry, no rashes noted Neuro:  Cranial Nerves: II: Visual Fields are full. PERRL.  III,IV, VI: EOMI without ptosis or diplopia.  V: Facial sensation is symmetric to temperature VII: Facial movement is symmetric.  VIII: hearing is intact to voice X: Palate elevates symmetrically XI: Shoulder shrug is symmetric. XII: tongue is midline without atrophy or fasciculations.  Motor: Tone is normal. Bulk is normal. 5/5 strength was present in all four extremities.  Sensory: Sensation is symmetric to light touch and temperature. Cerebellar: FNF and HKS are intact bilaterally, normal gait.  Psych: Normal affect and mood  ASSESSMENT/PLAN:   Migraine headache without aura Neurological exam benign.  Reassuringly, migraine characteristics are unchanged.  Do not feel head imaging is warranted at this time.  NSAIDs are first line for acute treatment of mild to moderate migraines - this appears more severe.She has tried combination analgesics  such as excedrin without relief. We discussed that medication overuse can cause rebound headaches if taken regularly. Triptans are first line for those not responsive to medications previously stated.  Reported allergy to imitrex noted (gives her nausea). Medication-overuse as well as uncontrolled HTN could be trigger for her current migraine.   Will start migraine prophylaxis therapy since patient is having 2 or more migraines a month with disability lasting 3 more days a month. -Start Lasmiditan given triptan  intolerance. 200 mg one time.  Instructions given to not repeat dose in 24. -Starting propanolol for migraine prophylaxis.  80 mg daily at bedtime.  Hypertension associated with diabetes ([place]) Congratulated her on her 7 pounds of weight loss since she was last seen 2 months ago.  BP still elevated today 142/106.  She does report adherence to blood pressure medications. -Recommended visit with PCP for HTN management -Starting propranolol 80 mg daily at bedtime for migraine prophylaxis and HTN control -Recommended BP measurements at home and recording a log of measurements  Weight loss Congratulated her on 7 lbs of weight loss since last visit. She has lost 25 lbs since June. Has changed her diet and started exercising more.  -Continue current efforts!     Sharion Settler, [place]

## 2021-11-10 ENCOUNTER — Telehealth: Payer: Self-pay

## 2021-11-10 ENCOUNTER — Other Ambulatory Visit: Payer: Self-pay

## 2021-11-10 ENCOUNTER — Ambulatory Visit (INDEPENDENT_AMBULATORY_CARE_PROVIDER_SITE_OTHER): Payer: No Typology Code available for payment source | Admitting: Family Medicine

## 2021-11-10 DIAGNOSIS — I152 Hypertension secondary to endocrine disorders: Secondary | ICD-10-CM

## 2021-11-10 DIAGNOSIS — E1159 Type 2 diabetes mellitus with other circulatory complications: Secondary | ICD-10-CM

## 2021-11-10 DIAGNOSIS — G43009 Migraine without aura, not intractable, without status migrainosus: Secondary | ICD-10-CM | POA: Insufficient documentation

## 2021-11-10 DIAGNOSIS — G43011 Migraine without aura, intractable, with status migrainosus: Secondary | ICD-10-CM | POA: Diagnosis not present

## 2021-11-10 DIAGNOSIS — R634 Abnormal weight loss: Secondary | ICD-10-CM | POA: Diagnosis not present

## 2021-11-10 MED ORDER — LASMIDITAN SUCCINATE 100 MG PO TABS: 200.0000 mg | ORAL_TABLET | Freq: Every day | ORAL | 0 refills | Status: DC | PRN

## 2021-11-10 MED ORDER — PROPRANOLOL HCL ER BEADS 80 MG PO CP24: 80.0000 mg | ORAL_CAPSULE | Freq: Every day | ORAL | 1 refills | Status: DC

## 2021-11-10 NOTE — Assessment & Plan Note (Signed)
Neurological exam benign.  Reassuringly, migraine characteristics are unchanged.  Do not feel head imaging is warranted at this time.  NSAIDs are first line for acute treatment of mild to moderate migraines - this appears more severe.She has tried combination analgesics such as excedrin without relief. We discussed that medication overuse can cause rebound headaches if taken regularly. Triptans are first line for those not responsive to medications previously stated.  Reported allergy to imitrex noted (gives her nausea). Medication-overuse as well as uncontrolled HTN could be trigger for her current migraine.   Will start migraine prophylaxis therapy since patient is having 2 or more migraines a month with disability lasting 3 more days a month. -Start Lasmiditan given triptan intolerance. 200 mg one time.  Instructions given to not repeat dose in 24. -Starting propanolol for migraine prophylaxis.  80 mg daily at bedtime.

## 2021-11-10 NOTE — Telephone Encounter (Signed)
Received fax from pharmacy, PA needed on Lasmiditan Succinate. Clinical questions submitted via Cover My Meds.  Cover My Meds info: Key: [id number]

## 2021-11-10 NOTE — Assessment & Plan Note (Signed)
Congratulated her on her 7 pounds of weight loss since she was last seen 2 months ago.  BP still elevated today 142/106.  She does report adherence to blood pressure medications. -Recommended visit with PCP for HTN management -Starting propranolol 80 mg daily at bedtime for migraine prophylaxis and HTN control -Recommended BP measurements at home and recording a log of measurements

## 2021-11-10 NOTE — Telephone Encounter (Signed)
Patient calls nurse line regarding pain management for migraine. Lasmiditan is not covered by insurance. PA in process.   Patient is requesting recommendations for pain medicine until determination on PA. PA can take up to 72 hours to process.   Please advise.   Talbot Grumbling, RN

## 2021-11-10 NOTE — Assessment & Plan Note (Signed)
Congratulated her on 7 lbs of weight loss since last visit. She has lost 25 lbs since June. Has changed her diet and started exercising more.  -Continue current efforts!

## 2021-11-11 ENCOUNTER — Other Ambulatory Visit: Payer: Self-pay | Admitting: Family Medicine

## 2021-11-11 MED ORDER — SUMATRIPTAN SUCCINATE 50 MG PO TABS: 100.0000 mg | ORAL_TABLET | ORAL | 0 refills | Status: DC | PRN

## 2021-11-11 MED ORDER — ONDANSETRON HCL 4 MG PO TABS: 4.0000 mg | ORAL_TABLET | Freq: Three times a day (TID) | ORAL | 0 refills | Status: DC | PRN

## 2021-11-11 NOTE — Telephone Encounter (Signed)
° ° °  Mary Settler, DO at 11/11/2021  8:02 AM  Status: Signed  PA for lasmiditan is in process. In the meantime, will send Rx for sumatriptan with zofran to use for nausea PRN (reported allergy to sumatriptan was nausea). Tried to call patient but no answer. Will send RN a note to see if they can contact patient later today to update her.       Pt called to check status.  Advised that we are still waiting on approval.  Also advised of above info from Dr. Nita Sells. [name], CMA

## 2021-11-11 NOTE — Progress Notes (Signed)
PA for lasmiditan is in process. In the meantime, will send Rx for sumatriptan with zofran to use for nausea PRN (reported allergy to sumatriptan was nausea). Tried to call patient but no answer. Will send RN a note to see if they can contact patient later today to update her.

## 2021-11-24 ENCOUNTER — Other Ambulatory Visit: Payer: Self-pay | Admitting: Family Medicine

## 2021-11-24 DIAGNOSIS — E1165 Type 2 diabetes mellitus with hyperglycemia: Secondary | ICD-10-CM

## 2021-12-02 ENCOUNTER — Other Ambulatory Visit: Payer: Self-pay | Admitting: Family Medicine

## 2021-12-02 ENCOUNTER — Ambulatory Visit (INDEPENDENT_AMBULATORY_CARE_PROVIDER_SITE_OTHER): Payer: No Typology Code available for payment source | Admitting: Family Medicine

## 2021-12-02 ENCOUNTER — Other Ambulatory Visit: Payer: Self-pay

## 2021-12-02 ENCOUNTER — Encounter: Payer: Self-pay | Admitting: Family Medicine

## 2021-12-02 VITALS — BP 141/100 | HR 93 | Ht 67.0 in | Wt 201.0 lb

## 2021-12-02 DIAGNOSIS — I152 Hypertension secondary to endocrine disorders: Secondary | ICD-10-CM

## 2021-12-02 DIAGNOSIS — E038 Other specified hypothyroidism: Secondary | ICD-10-CM | POA: Diagnosis not present

## 2021-12-02 DIAGNOSIS — Z975 Presence of (intrauterine) contraceptive device: Secondary | ICD-10-CM | POA: Diagnosis not present

## 2021-12-02 DIAGNOSIS — E7849 Other hyperlipidemia: Secondary | ICD-10-CM | POA: Diagnosis not present

## 2021-12-02 DIAGNOSIS — Z794 Long term (current) use of insulin: Secondary | ICD-10-CM

## 2021-12-02 DIAGNOSIS — E1159 Type 2 diabetes mellitus with other circulatory complications: Secondary | ICD-10-CM | POA: Diagnosis not present

## 2021-12-02 DIAGNOSIS — G43809 Other migraine, not intractable, without status migrainosus: Secondary | ICD-10-CM

## 2021-12-02 DIAGNOSIS — E119 Type 2 diabetes mellitus without complications: Secondary | ICD-10-CM

## 2021-12-02 DIAGNOSIS — E039 Hypothyroidism, unspecified: Secondary | ICD-10-CM

## 2021-12-02 DIAGNOSIS — G43009 Migraine without aura, not intractable, without status migrainosus: Secondary | ICD-10-CM | POA: Diagnosis not present

## 2021-12-02 MED ORDER — PROPRANOLOL HCL ER BEADS 80 MG PO CP24: 80.0000 mg | ORAL_CAPSULE | Freq: Every day | ORAL | 0 refills | Status: DC

## 2021-12-02 MED ORDER — LEVOTHYROXINE SODIUM 100 MCG PO TABS: 100.0000 ug | ORAL_TABLET | Freq: Every day | ORAL | 0 refills | Status: DC

## 2021-12-02 MED ORDER — ROSUVASTATIN CALCIUM 10 MG PO TABS: 10.0000 mg | ORAL_TABLET | Freq: Every day | ORAL | 0 refills | Status: DC

## 2021-12-02 NOTE — Progress Notes (Signed)
° ° °  SUBJECTIVE:   CHIEF COMPLAINT / HPI:   Hypothyroidism Follow-Up Patient with hx of hypothyroidism- on synthroid 138mcg daily. Last TSH (09/14/2021) was elevated to 6.780. No dose adjustments were made at that time as patient had not been compliant with her home Synthroid. Today she admits she still has not been taking the Synthroid regularly. No significant symptoms of hypothyroidism.  Migraine Follow-Up Patient was seen 3 weeks ago for severe intractable migraine. She is unable to take triptans (reports Imitrex makes her migraines worse), so she was given Rx for Lasmiditan. Unfortunately it required prior auth from her insurance so took several days to obtain and she didn't need it by that time. Patient's migraine eventually subsided after 5 days. She is now on propranolol 80mg  qhs for prophylaxis and has not had another migraine since. Requests refills on her propranolol today.  IUD Removal Patient had Mirena IUD placed in June 2014. Unable to visualize strings since 2018, however had ultrasound in 2018 and CT in 2021 which confirmed normal location of IUD in uterus. Overdue for removal- patient has been reminded several times to contact OBGYN about this.   OBJECTIVE:   BP (!) 141/100    Pulse 93    Ht 5\' 7"  (1.702 m)    Wt 201 lb (91.2 kg)    SpO2 99%    BMI 31.48 kg/m   General: NAD, pleasant, able to participate in exam Respiratory: No respiratory distress Skin: warm and dry, no rashes noted Psych: Normal affect and mood Neuro: grossly intact   ASSESSMENT/PLAN:   Other specified hypothyroidism Not well-controlled secondary to poor medication adherence. Last TSH elevated to 6.780 on 09/14/2021. She is prescribed Synthroid 199mcg daily. -Encouraged improved medication compliance. Patient seems motivated. No indication for dose increase at this time -Recheck TSH at next visit   Migraine headache without aura Doing well on propranolol 80mg  qhs for prophylaxis. Has Lasmiditan  for abortive agent if needed. Requests refills on her propranolol today. Received notification propranolol is non-preferred, so nadolol sent instead.  Weight loss Intentional. Patient has been working very hard on lifestyle changes and also takes Ozempic 1mg  weekly for her diabetes. Congratulated her on her efforts  IUD (intrauterine device) in place Mirena placed in June 2014-- overdue for removal. Strings unable to be visualized for several years but imaging has confirmed normal location in uterus.  -Patient again reminded to contact OBGYN for removal (given [facility] to [facility]). She is agreeable and motivated to do so. -Will place new referral to OBGYN (previous one was canceled) to help facilitate process as well -Advised to use alternate form of contraception  Hypertension associated with diabetes ([place]) BP remains above goal, this has been a very longstanding issue. 141/100 today. She reports compliance with amlodipine-valsartan 10-160mg . Recently started on beta blocker for migraine prophylaxis which may have some positive impact on BP as well. If persistently elevated at next appointment, will add HCTZ to her combo pill. Last BMP 09/14/2021 wnl.  Type 2 diabetes mellitus without complication, without long-term current use of insulin ([facility]) Well controlled. Last A1c 5.9% on 09/14/2021. This was a huge improvement as she had A1c >12 in the recent past. Despite age of 64, patient would benefit from statin therapy (lipid profile 09/14/2021 with LDL 141, several other risk factors for CVD) -Start rosuvastatin 10mg  daily -Return in 3 months for full diabetes visit     Alcus Dad, MD [place]

## 2021-12-02 NOTE — Patient Instructions (Addendum)
It was great to see you, as always!  2023 is the year for your health. Congratulations on the weight loss and improving your diabetes. Next steps: better adherence to your thyroid medicine and getting your IUD removed.  I have placed a new referral to OBGYN so they may reach out to you to schedule an appointment. You should be able to call [facility] to make an appointment even without this referral so give it a try as soon as possible. Their phone # is [phone]. Remember to use an alternate form of birth control in the meantime.  I have sent refills on your Synthroid (thyroid medicine) and Propranolol (migraine medicine).  We are starting a new medication called rosuvastatin (Crestor) to help reduce risk of cardiovascular disease. Take 1 pill daily.  Take care, Dr. Rock Nephew

## 2021-12-02 NOTE — Telephone Encounter (Signed)
Propranolol apparently not covered by her insurance-- Nadolol sent instead

## 2021-12-03 NOTE — Assessment & Plan Note (Signed)
Doing well on propranolol 80mg  qhs for prophylaxis. Has Lasmiditan for abortive agent if needed. Requests refills on her propranolol today. Received notification propranolol is non-preferred, so nadolol sent instead.

## 2021-12-03 NOTE — Assessment & Plan Note (Signed)
Well controlled. Last A1c 5.9% on 09/14/2021. This was a huge improvement as she had A1c >12 in the recent past. Despite age of 45, patient would benefit from statin therapy (lipid profile 09/14/2021 with LDL 141, several other risk factors for CVD) -Start rosuvastatin 10mg  daily -Return in 3 months for full diabetes visit

## 2021-12-03 NOTE — Assessment & Plan Note (Signed)
Not well-controlled secondary to poor medication adherence. Last TSH elevated to 6.780 on 09/14/2021. She is prescribed Synthroid 114mcg daily. -Encouraged improved medication compliance. Patient seems motivated. No indication for dose increase at this time -Recheck TSH at next visit

## 2021-12-03 NOTE — Assessment & Plan Note (Addendum)
BP remains above goal, this has been a very longstanding issue. 141/100 today. She reports compliance with amlodipine-valsartan 10-160mg . Recently started on beta blocker for migraine prophylaxis which may have some positive impact on BP as well. If persistently elevated at next appointment, will add HCTZ to her combo pill. Last BMP 09/14/2021 wnl.

## 2021-12-03 NOTE — Assessment & Plan Note (Signed)
Intentional. Patient has been working very hard on lifestyle changes and also takes Ozempic 1mg  weekly for her diabetes. Congratulated her on her efforts

## 2021-12-03 NOTE — Assessment & Plan Note (Addendum)
Mirena placed in June 2014-- overdue for removal. Strings unable to be visualized for several years but imaging has confirmed normal location in uterus.  -Patient again reminded to contact OBGYN for removal (given [facility] to [facility]). She is agreeable and motivated to do so. -Will place new referral to OBGYN (previous one was canceled) to help facilitate process as well -Advised to use alternate form of contraception

## 2021-12-21 ENCOUNTER — Other Ambulatory Visit: Payer: Self-pay | Admitting: Family Medicine

## 2021-12-21 DIAGNOSIS — I152 Hypertension secondary to endocrine disorders: Secondary | ICD-10-CM

## 2022-01-13 DIAGNOSIS — H524 Presbyopia: Secondary | ICD-10-CM | POA: Diagnosis not present

## 2022-01-13 DIAGNOSIS — H25013 Cortical age-related cataract, bilateral: Secondary | ICD-10-CM | POA: Diagnosis not present

## 2022-01-13 DIAGNOSIS — Z7984 Long term (current) use of oral hypoglycemic drugs: Secondary | ICD-10-CM | POA: Diagnosis not present

## 2022-01-13 DIAGNOSIS — H5213 Myopia, bilateral: Secondary | ICD-10-CM | POA: Diagnosis not present

## 2022-01-13 DIAGNOSIS — E1136 Type 2 diabetes mellitus with diabetic cataract: Secondary | ICD-10-CM | POA: Diagnosis not present

## 2022-02-17 LAB — HM DIABETES EYE EXAM

## 2022-02-28 ENCOUNTER — Other Ambulatory Visit: Payer: Self-pay | Admitting: Family Medicine

## 2022-02-28 DIAGNOSIS — Z794 Long term (current) use of insulin: Secondary | ICD-10-CM

## 2022-02-28 DIAGNOSIS — E038 Other specified hypothyroidism: Secondary | ICD-10-CM

## 2022-02-28 DIAGNOSIS — E7849 Other hyperlipidemia: Secondary | ICD-10-CM

## 2022-03-08 ENCOUNTER — Ambulatory Visit: Payer: No Typology Code available for payment source | Admitting: Family Medicine

## 2022-03-08 NOTE — Progress Notes (Deleted)
? ? ?  SUBJECTIVE:  ? ?CHIEF COMPLAINT / HPI:  ? ?T2DM ?Last A1c 5.9% on 09/14/2021. ?Current meds: ?On ARB and Statin, UTD on eye exam ?*needs foot exam* ? ?Hypothyroidism ?Has had difficulty with adherence to synthroid in the past. Last TSH 09/14/2021 elevated to 6.780. *** ? ?HTN ?Current meds: amlodipine-valsartan 10-'160mg'$ . Compliance *** ?Checks BP at home? ?Discussed adding HCTZ to her combo pill ? ?HM ?Due for Pap. Also needs IUD removal. ? ?PERTINENT  PMH / PSH: *** ? ?OBJECTIVE:  ? ?There were no vitals taken for this visit.  ?*** ? ?ASSESSMENT/PLAN:  ? ?No problem-specific Assessment & Plan notes found for this encounter. ?  ? ? ?Alcus Dad, MD ?[place]  ?

## 2022-03-19 NOTE — Progress Notes (Signed)
? ? ?  SUBJECTIVE:  ? ?CHIEF COMPLAINT / HPI:  ? ?T2DM ?Last A1c 5.9% on 09/14/2021. ?Current meds: Farxiga '5mg'$  daily, Ozempic '1mg'$  weekly ?Reports fair medication compliance-- forgets Farxiga 2-3x/week, never misses Ozempic ?Checks sugars once per week-- fasting typically 120s  ?Tries to watch her diet ?On ARB, UTD on eye exam ? ?Hypothyroidism ?Has had difficulty with adherence to synthroid in the past. Recently has done much better but still misses dose 2-3x/week. ?Last TSH 09/14/2021 elevated to 6.780.  ?She endorses fatigue and cold intolerance.  ? ?HTN ?Current meds: amlodipine-valsartan 10-'160mg'$ . Misses medication 2-3x/week. ?Does not check BP at home because insurance wouldn't cover BP cuff, unable to afford ? ?Health Maintenance ?Due for Pap. Also needs IUD removal. ?Overdue for mammogram (needs annual due to h/o Hodgkin's lymphoma s/p radiation) ? ?Headaches ?Fatigue ?Advised to schedule separate appt to discuss ? ?PERTINENT  PMH / PSH: h/o Hodgkin's lymphoma ? ?OBJECTIVE:  ? ?BP (!) 156/106   Pulse 89   Ht '5\' 7"'$  (1.702 m)   Wt 192 lb (87.1 kg)   SpO2 100%   BMI 30.07 kg/m?   ?General: NAD, pleasant, able to participate in exam ?Respiratory: No respiratory distress ?Psych: Normal affect and mood ?Neuro: grossly intact ? ?ASSESSMENT/PLAN:  ? ?Other specified hypothyroidism ?Has not been well-controlled in the past due to suboptimal medication adherence. Continue Synthroid 19mg daily. Check TSH today. ? ?Type 2 diabetes mellitus without complication, without long-term current use of insulin ([place] ?Very well-controlled currently. A1c 5.3% today. ?-Continue Farxiga '5mg'$  daily and Ozempic '1mg'$  weekly ?-If A1c remains in normal range at next visit, consider stopping FIran?-Continue ARB ?-Will need statin starting at age 39? ?Hypertension associated with diabetes ([facility] ?BP remains above goal. This has been a longstanding issue that has been largely resistant to treatment in the past ?-Add HCTZ '25mg'$  to  current combo pill of amlodipine-valsartan 10-'160mg'$  ?-BMP wnl 6 months ago ?  ?Fatigue ?-Check CBC today while obtaining labs ?-Patient to return in 2 weeks to discuss further ? ?Health Maintenance ?Advised to contact OBGYN regarding IUD removal and Pap ?Discuss additional items (mammogram) at next visit ? ? ?AAlcus Dad MD ?[place] ?

## 2022-03-22 ENCOUNTER — Ambulatory Visit (INDEPENDENT_AMBULATORY_CARE_PROVIDER_SITE_OTHER): Payer: No Typology Code available for payment source | Admitting: Family Medicine

## 2022-03-22 ENCOUNTER — Encounter: Payer: Self-pay | Admitting: Family Medicine

## 2022-03-22 VITALS — BP 156/106 | HR 89 | Ht 67.0 in | Wt 192.0 lb

## 2022-03-22 DIAGNOSIS — E1159 Type 2 diabetes mellitus with other circulatory complications: Secondary | ICD-10-CM | POA: Diagnosis not present

## 2022-03-22 DIAGNOSIS — Z8571 Personal history of Hodgkin lymphoma: Secondary | ICD-10-CM

## 2022-03-22 DIAGNOSIS — E038 Other specified hypothyroidism: Secondary | ICD-10-CM | POA: Diagnosis not present

## 2022-03-22 DIAGNOSIS — E119 Type 2 diabetes mellitus without complications: Secondary | ICD-10-CM

## 2022-03-22 DIAGNOSIS — R5383 Other fatigue: Secondary | ICD-10-CM | POA: Diagnosis not present

## 2022-03-22 DIAGNOSIS — I152 Hypertension secondary to endocrine disorders: Secondary | ICD-10-CM

## 2022-03-22 LAB — POCT GLYCOSYLATED HEMOGLOBIN (HGB A1C): HbA1c, POC (controlled diabetic range): 5.3 % (ref 0.0–7.0)

## 2022-03-22 MED ORDER — AMLODIPINE-VALSARTAN-HCTZ 10-160-25 MG PO TABS: 1.0000 | ORAL_TABLET | Freq: Every day | ORAL | 1 refills | Status: DC

## 2022-03-22 NOTE — Assessment & Plan Note (Addendum)
BP remains above goal. This has been a longstanding issue that has been largely resistant to treatment in the past ?-Add HCTZ '25mg'$  to current combo pill of amlodipine-valsartan 10-'160mg'$  ?-BMP wnl 6 months ago ?

## 2022-03-22 NOTE — Assessment & Plan Note (Signed)
Very well-controlled currently. A1c 5.3% today. ?-Continue Farxiga '5mg'$  daily and Ozempic '1mg'$  weekly ?-If A1c remains in normal range at next visit, consider stopping [place] ?-Continue ARB ?-Will need statin starting at age 39 ?

## 2022-03-22 NOTE — Assessment & Plan Note (Signed)
Has not been well-controlled in the past due to suboptimal medication adherence. Continue Synthroid 172mg daily. Check TSH today. ?

## 2022-03-22 NOTE — Patient Instructions (Addendum)
It was great to see you! ? ?Your A1c was 5.3% today, which is EXCELLENT! Keep up the great work. If your next A1c is equally as good, we can consider adjusting your medications. ? ?We are changing your blood pressure medicine today. Pick up the new prescription from your pharmacy. Take 1 pill daily. ? ?We are checking your thyroid levels today. I will send you a MyChart message with the results. Do your best to continue taking the Synthroid every day. ? ?PLE[facility] call [facility] to have your [facility] (and get your Pap). [phone] ? ?Take care, ?Dr. Rock Nephew ? ? ?

## 2022-03-23 LAB — CBC
Hematocrit: 38.9 % (ref 34.0–46.6)
Hemoglobin: 13.4 g/dL (ref 11.1–15.9)
MCH: 31.7 pg (ref 26.6–33.0)
MCHC: 34.4 g/dL (ref 31.5–35.7)
MCV: 92 fL (ref 79–97)
Platelets: 270 10*3/uL (ref 150–450)
RBC: 4.23 x10E6/uL (ref 3.77–5.28)
RDW: 12.4 % (ref 11.7–15.4)
WBC: 4.1 10*3/uL (ref 3.4–10.8)

## 2022-03-23 LAB — TSH: TSH: 3.85 u[IU]/mL (ref 0.450–4.500)

## 2022-04-05 ENCOUNTER — Other Ambulatory Visit: Payer: Self-pay | Admitting: Family Medicine

## 2022-04-05 DIAGNOSIS — I152 Hypertension secondary to endocrine disorders: Secondary | ICD-10-CM

## 2022-04-06 ENCOUNTER — Encounter: Payer: Self-pay | Admitting: Family Medicine

## 2022-04-06 ENCOUNTER — Ambulatory Visit (INDEPENDENT_AMBULATORY_CARE_PROVIDER_SITE_OTHER): Payer: No Typology Code available for payment source | Admitting: Family Medicine

## 2022-04-06 VITALS — BP 135/83 | Ht 67.0 in | Wt 192.5 lb

## 2022-04-06 DIAGNOSIS — G43009 Migraine without aura, not intractable, without status migrainosus: Secondary | ICD-10-CM

## 2022-04-06 DIAGNOSIS — I152 Hypertension secondary to endocrine disorders: Secondary | ICD-10-CM | POA: Diagnosis not present

## 2022-04-06 DIAGNOSIS — E038 Other specified hypothyroidism: Secondary | ICD-10-CM | POA: Diagnosis not present

## 2022-04-06 DIAGNOSIS — E1165 Type 2 diabetes mellitus with hyperglycemia: Secondary | ICD-10-CM | POA: Diagnosis not present

## 2022-04-06 DIAGNOSIS — Z1231 Encounter for screening mammogram for malignant neoplasm of breast: Secondary | ICD-10-CM | POA: Diagnosis not present

## 2022-04-06 DIAGNOSIS — Z8571 Personal history of Hodgkin lymphoma: Secondary | ICD-10-CM

## 2022-04-06 DIAGNOSIS — E1159 Type 2 diabetes mellitus with other circulatory complications: Secondary | ICD-10-CM

## 2022-04-06 DIAGNOSIS — E669 Obesity, unspecified: Secondary | ICD-10-CM | POA: Diagnosis not present

## 2022-04-06 MED ORDER — LASMIDITAN SUCCINATE 100 MG PO TABS: 200.0000 mg | ORAL_TABLET | Freq: Every day | ORAL | 0 refills | Status: DC | PRN

## 2022-04-06 MED ORDER — OZEMPIC (1 MG/DOSE) 4 MG/3ML ~~LOC~~ SOPN: 2.0000 mg | PEN_INJECTOR | SUBCUTANEOUS | 4 refills | Status: DC

## 2022-04-06 MED ORDER — LEVOTHYROXINE SODIUM 100 MCG PO TABS: 100.0000 ug | ORAL_TABLET | Freq: Every day | ORAL | 0 refills | Status: DC

## 2022-04-06 NOTE — Assessment & Plan Note (Addendum)
Well-controlled today. This is the first time in a very long time that her blood pressure has been at goal. Continue Amlodipine-Valsartan-HCTZ 10-160-'25mg'$ . Continue Nadolol for migraine ppx as well. Return in 2 weeks for lab visit for BMP since recent addition of HCTZ (future lab orders placed). Will send message to pharmacy team to see if we can get home BP cuff for her. ?

## 2022-04-06 NOTE — Assessment & Plan Note (Addendum)
TSH wnl at last visit. Patient informed of these results today. Continue Synthroid 15mg daily. Encouraged adherence to daily Synthroid. Refills sent. Recheck TSH in 6 months. ?

## 2022-04-06 NOTE — Patient Instructions (Addendum)
It was great to see you! ? ?Things we discussed at today's visit: ? ?- You are overdue for a mammogram (screening test for breast cancer). Please call [facility] at [phone] to schedule an appointment. They are located at [place]. ? ?-Please schedule an appointment with the cancer center/survivorship program. Their number is [phone] ? ?-I have sent refills on your thyroid medication and the medication to take as needed for severe migraine. I have sent the increased dose of Ozempic (hopefully insurance will cover). ? ?-I will send a message to our pharmacy staff about trying to get you a home blood pressure cuff. ? ?-We need to check your blood work in 2 weeks. Please schedule a lab appointment on your way out today. ? ?Take care and seek immediate care sooner if you develop any concerns. ? ?Dr. Rock Nephew ?[facility]  ?

## 2022-04-06 NOTE — Assessment & Plan Note (Signed)
Well-controlled on daily nadolol. Refills sent for Lasmiditin as needed for severe migraine. ?

## 2022-04-06 NOTE — Assessment & Plan Note (Signed)
Improving with combination of Ozempic (which she takes for diabetes) and lifestyle modifications. However, BMI remains >30 with comorbidities (HTN, DM, dyslipidemia). ?-Increase Ozempic to 1.'5mg'$  weekly ?-Continue regular physical activity and healthy dietary choices ?

## 2022-04-06 NOTE — Progress Notes (Signed)
    SUBJECTIVE:   CHIEF COMPLAINT / HPI:   f/u Thyroid Results -Patient wanting to discuss her thyroid since levels were checked at last visit -Takes Synthroid 120mg daily, usually misses 2 doses/week -At last visit endorsed some fatigue and cold intolerance  Weight Management -Currently taking Ozempic '1mg'$  weekly for diabetes and obesity -Patient would like to increase dose -Feels like she has reached a plateau in terms of weight loss -Exercises 3x/week at the gym: bike, weight training, treadmill -No adverse effects on '1mg'$  dose  Hypertension -HCTZ was added to her combo pill of amlodipine-valsartan at last visit -She just started this new triple combo pill yesterday -Also on nadolol for migraine prophylaxis -Overall good compliance with BP medication, will sometimes miss doses on the weekend -Does not check BP at home, unable to afford BP cuff, insurance wouldn't cover it  Med Refills -Needs refill on her migraine abortifacient-- [facility] -Overdue for Pap and IUD removal  PERTINENT  PMH / PSH: T2DM, hx of Hodgkin's lymphoma, remainder as above  OBJECTIVE:   BP 135/83   Ht '5\' 7"'$  (1.702 m)   Wt 192 lb 8 oz (87.3 kg)   BMI 30.15 kg/m   Gen: NAD, pleasant, able to participate in exam Neck: thyroid symmetric, normal size CV: RRR, normal SL7/L8 II/VI systolic murmur Resp: Normal effort, lungs CTAB Extremities: no edema or cyanosis Skin: warm and dry, no rashes noted Neuro: alert, no obvious focal deficits Psych: Normal affect and mood   ASSESSMENT/PLAN:   Other specified hypothyroidism TSH wnl at last visit. Patient informed of these results today. Continue Synthroid 1029m daily. Encouraged adherence to daily Synthroid. Refills sent. Recheck TSH in 6 months.  Migraine headache without aura Well-controlled on daily nadolol. Refills sent for Lasmiditin as needed for severe migraine.  Hypertension associated with  diabetes ([place] today. This is the first time in a very long time that her blood pressure has been at goal. Continue Amlodipine-Valsartan-HCTZ 10-160-'25mg'$ . Continue Nadolol for migraine ppx as well. Return in 2 weeks for lab visit for BMP since recent addition of HCTZ (future lab orders placed). Will send message to pharmacy team to see if we can get home BP cuff for her.  Obesity with body mass index (BMI) of 30.0 to 39.9 Improving with combination of Ozempic (which she takes for diabetes) and lifestyle modifications. However, BMI remains >30 with comorbidities (HTN, DM, dyslipidemia). -Increase Ozempic to 1.'5mg'$  weekly -Continue regular physical activity and healthy dietary choices   Health Maintenance -Mammogram ordered today. Patient to call breast center to schedule. -Overdue for Pap and IUD removal-- patient once again reminded to call OB since IUD strings not visualized in the past -Encouraged her to return to cancer center survivorship program for routine surveillance due to hx of Hodgkin's lyMaryvilleMD CoGardiner

## 2022-04-21 ENCOUNTER — Other Ambulatory Visit: Payer: No Typology Code available for payment source

## 2022-04-28 ENCOUNTER — Ambulatory Visit
Admission: RE | Admit: 2022-04-28 | Discharge: 2022-04-28 | Disposition: A | Discharge status: Home / Self Care | Payer: No Typology Code available for payment source | Source: Ambulatory Visit | Attending: Family Medicine | Admitting: Family Medicine

## 2022-04-28 ENCOUNTER — Telehealth: Payer: Self-pay

## 2022-04-28 DIAGNOSIS — Z8571 Personal history of Hodgkin lymphoma: Secondary | ICD-10-CM

## 2022-04-28 DIAGNOSIS — Z1231 Encounter for screening mammogram for malignant neoplasm of breast: Secondary | ICD-10-CM

## 2022-04-28 NOTE — Telephone Encounter (Signed)
Pt called and voices concerns requesting to be seen again at [facility] as she has had intermittent swelling to her (L) axilla and (L) clavicle. Pt has hx of NHL and last saw Dr Alvy Bimler in 2017, then Wilber Bihari, NP Pt had screening MM which was negative for malignancy but is interested in having further scans to r/o recurrence of NHL. Message sent to Dr Alvy Bimler regarding pt and her status. She knows we will be in touch to further assist.

## 2022-04-29 ENCOUNTER — Telehealth: Payer: Self-pay

## 2022-04-29 NOTE — Telephone Encounter (Signed)
Called and scheduled appt with Dr. Alvy Bimler for 6/13 at 9 am. Offered appt at 1 pm today and she declined. She is aware of appt.

## 2022-05-03 ENCOUNTER — Encounter: Payer: Self-pay | Admitting: *Deleted

## 2022-05-09 ENCOUNTER — Encounter: Payer: Self-pay | Admitting: Hematology and Oncology

## 2022-05-09 ENCOUNTER — Ambulatory Visit: Payer: No Typology Code available for payment source | Admitting: Hematology and Oncology

## 2022-05-10 ENCOUNTER — Other Ambulatory Visit: Payer: Self-pay

## 2022-05-10 ENCOUNTER — Encounter: Payer: Self-pay | Admitting: Hematology and Oncology

## 2022-05-10 ENCOUNTER — Inpatient Hospital Stay: Payer: No Typology Code available for payment source

## 2022-05-10 ENCOUNTER — Inpatient Hospital Stay
Payer: No Typology Code available for payment source | Attending: Hematology and Oncology | Admitting: Hematology and Oncology

## 2022-05-10 VITALS — BP 129/84 | HR 81 | Temp 98.5°F | Resp 16 | Ht 67.0 in | Wt 184.6 lb

## 2022-05-10 DIAGNOSIS — Z8041 Family history of malignant neoplasm of ovary: Secondary | ICD-10-CM | POA: Diagnosis not present

## 2022-05-10 DIAGNOSIS — E559 Vitamin D deficiency, unspecified: Secondary | ICD-10-CM

## 2022-05-10 DIAGNOSIS — M7918 Myalgia, other site: Secondary | ICD-10-CM | POA: Insufficient documentation

## 2022-05-10 DIAGNOSIS — Z8571 Personal history of Hodgkin lymphoma: Secondary | ICD-10-CM

## 2022-05-10 DIAGNOSIS — Z923 Personal history of irradiation: Secondary | ICD-10-CM | POA: Insufficient documentation

## 2022-05-10 DIAGNOSIS — R252 Cramp and spasm: Secondary | ICD-10-CM | POA: Diagnosis not present

## 2022-05-10 DIAGNOSIS — D72819 Decreased white blood cell count, unspecified: Secondary | ICD-10-CM

## 2022-05-10 DIAGNOSIS — R700 Elevated erythrocyte sedimentation rate: Secondary | ICD-10-CM | POA: Diagnosis not present

## 2022-05-10 DIAGNOSIS — Z9221 Personal history of antineoplastic chemotherapy: Secondary | ICD-10-CM | POA: Diagnosis not present

## 2022-05-10 DIAGNOSIS — R7989 Other specified abnormal findings of blood chemistry: Secondary | ICD-10-CM

## 2022-05-10 DIAGNOSIS — R519 Headache, unspecified: Secondary | ICD-10-CM | POA: Diagnosis not present

## 2022-05-10 DIAGNOSIS — Z87891 Personal history of nicotine dependence: Secondary | ICD-10-CM | POA: Diagnosis not present

## 2022-05-10 DIAGNOSIS — M549 Dorsalgia, unspecified: Secondary | ICD-10-CM | POA: Insufficient documentation

## 2022-05-10 LAB — CMP (CANCER CENTER ONLY)
ALT: 21 U/L (ref 0–44)
AST: 16 U/L (ref 15–41)
Albumin: 3.8 g/dL (ref 3.5–5.0)
Alkaline Phosphatase: 59 U/L (ref 38–126)
Anion gap: 4 — ABNORMAL LOW (ref 5–15)
BUN: 15 mg/dL (ref 6–20)
CO2: 26 mmol/L (ref 22–32)
Calcium: 9 mg/dL (ref 8.9–10.3)
Chloride: 107 mmol/L (ref 98–111)
Creatinine: 1.26 mg/dL — ABNORMAL HIGH (ref 0.44–1.00)
GFR, Estimated: 56 mL/min — ABNORMAL LOW (ref >60)
Glucose, Bld: 100 mg/dL — ABNORMAL HIGH (ref 70–99)
Potassium: 4.1 mmol/L (ref 3.5–5.1)
Sodium: 137 mmol/L (ref 135–145)
Total Bilirubin: 0.4 mg/dL (ref 0.3–1.2)
Total Protein: 7.1 g/dL (ref 6.5–8.1)

## 2022-05-10 LAB — CBC WITH DIFFERENTIAL (CANCER CENTER ONLY)
Abs Immature Granulocytes: 0.01 10*3/uL (ref 0.00–0.07)
Basophils Absolute: 0 10*3/uL (ref 0.0–0.1)
Basophils Relative: 1 %
Eosinophils Absolute: 0.2 10*3/uL (ref 0.0–0.5)
Eosinophils Relative: 7 %
HCT: 35.9 % — ABNORMAL LOW (ref 36.0–46.0)
Hemoglobin: 12.6 g/dL (ref 12.0–15.0)
Immature Granulocytes: 0 %
Lymphocytes Relative: 44 %
Lymphs Abs: 1.2 10*3/uL (ref 0.7–4.0)
MCH: 31.4 pg (ref 26.0–34.0)
MCHC: 35.1 g/dL (ref 30.0–36.0)
MCV: 89.5 fL (ref 80.0–100.0)
Monocytes Absolute: 0.5 10*3/uL (ref 0.1–1.0)
Monocytes Relative: 18 %
Neutro Abs: 0.9 10*3/uL — ABNORMAL LOW (ref 1.7–7.7)
Neutrophils Relative %: 30 %
Platelet Count: 229 10*3/uL (ref 150–400)
RBC: 4.01 MIL/uL (ref 3.87–5.11)
RDW: 11.9 % (ref 11.5–15.5)
WBC Count: 2.8 10*3/uL — ABNORMAL LOW (ref 4.0–10.5)
nRBC: 0 % (ref 0.0–0.2)

## 2022-05-10 LAB — SEDIMENTATION RATE: Sed Rate: 36 mm/hr — ABNORMAL HIGH (ref 0–22)

## 2022-05-10 LAB — LACTATE DEHYDROGENASE: LDH: 125 U/L (ref 98–192)

## 2022-05-10 LAB — VITAMIN D 25 HYDROXY (VIT D DEFICIENCY, FRACTURES): Vit D, 25-Hydroxy: 10.67 ng/mL — ABNORMAL LOW (ref 30–100)

## 2022-05-10 NOTE — Progress Notes (Signed)
[place] FOLLOW-UP progress notes  Patient Care Team: Alcus Dad, MD as PCP - General (Family Medicine)  CHIEF COMPLAINTS/PURPOSE OF VISIT:  History of lymphoma, thickness on the left axilla, concern for recurrent lymphoma  HISTORY OF PRESENTING ILLNESS:  [name] 41 y.o. female is seen due to new concerns She was last seen by myself several years ago for history of Hodgkin lymphoma, adequately treated Over the last few years, she was seen at symptom management clinic Most recently, she thought she felt intermittent fullness on the left axilla She has recurrent upper respiratory tract infection on and off The patient has lost a lot of weight when she started taking semaglutide recently She is pleased with her results She has diffuse musculoskeletal pain, intermittent back aches, headaches and cramps  I reviewed the patient's records extensive and collaborated the history with the patient. Summary of her history is as follows: Oncology History Overview Note  Hodgkin's lymphoma   Primary site: Lymphoid Neoplasms (Left)   Staging method: AJCC 6th Edition   Clinical: Stage II   Summary: Stage II     History of Hodgkin's lymphoma  01/11/2012 Procedure   [id number] Left axillary LN biopsy showed Hodgkin lymphoma   01/24/2012 Imaging   Ct scan of chest, abdomen and pelvis & PET scan showed bulky left axillary LN, supraclavicular lymphadenoapthy and medistinal involvement   02/03/2012 - 05/11/2012 Chemotherapy   She completed 4 cycles of ABVD   05/28/2012 Imaging   PET scan showed partial response   06/18/2012 - 07/16/2012 Radiation Therapy   She completed radiation treatment   07/27/2012 Imaging   CT scan of chest showed pneumonia   03/28/2013 Imaging   Ct chest showed no recurrence   04/15/2013 Imaging   Ct angiogram showed pneumonia   06/04/2013 Imaging   PET scan showed complete response   08/06/2013 Imaging   Ct scan showed no recurrence    02/04/2014 Imaging   Ct chest showed no recurrence   07/31/2014 Imaging   Ct chest showed no recurrence   05/01/2020 Imaging   Ct neck Prominent inferior right intraparotid node measuring 0.6 cm. Otherwise no cervical adenopathy or mass lesion.  CT chest, abdomen and pelvis 1. Stable exam. No new or progressive interval findings. Specifically, no evidence for recurrent lymphadenopathy in the chest, abdomen, or pelvis. 2. Stable volume loss and scarring in the left lung presumably related to sequelae of prior therapy.     MEDICAL HISTORY:  Past Medical History:  Diagnosis Date   Asthma    seasonal, worse in spring. Uses inhaler during this time of year   Benign essential HTN 9/62/2297   Complication of anesthesia SLOW TO WAKE UP AFTER C SECTION   Cough, persistent 07/27/2012   DM II (diabetes mellitus, type II), controlled ([place]) 01/19/2012   Eczema 01/19/2012   History of Hodgkin's lymphoma 07/27/2012   Stage IIB dx 2/13 Rx ABVD X 4 then involved field Radiation    Hodgkin lymphoma ([place]) 01/18/2012   left axilla & left neck dx'd 01/16/2012   Hypertension    Hypothyroidism    06/2014   Leukopenia 08/01/2012   Migraine    Muscle spasm of back 03/09/2012   Preventive measure 08/07/2014   Proteinuria 01/10/2014   S/P chemotherapy, time since 4-12 weeks 05/11/2012   4 cycles of ABVD with neulasta support and zoladex   Uncontrolled type 2 diabetes mellitus 07/17/2015    SURGICAL HISTORY: Past Surgical History:  Procedure Laterality Date  axillary lymph node biopsy  12/2011   left   CESAREAN SECTION  2008   DILATION AND CURETTAGE OF UTERUS     Early 2000s   KNEE SURGERY Right 11/02/2016   Torn Maniscus   PORTACATH PLACEMENT  01/25/2012   Procedure: INSERTION PORT-A-CATH;  Surgeon: Gayland Curry, MD,FACS;  Location: [facility];  Service: General;  Laterality: Right;  right subclavian    SOCIAL HISTORY: Social History   Socioeconomic History   Marital status: Married    Spouse name:  Not on file   Number of children: Not on file   Years of education: Not on file   Highest education level: Not on file  Occupational History   Not on file  Tobacco Use   Smoking status: Former    Packs/day: 0.25    Years: 5.00    Total pack years: 1.25    Types: Cigarettes    Quit date: 11/28/2002    Years since quitting: 19.4   Smokeless tobacco: Never   Tobacco comments:    Quit ?2004-2005  Substance and Sexual Activity   Alcohol use: No    Alcohol/week: 0.0 standard drinks of alcohol   Drug use: No   Sexual activity: Yes    Partners: Male    Birth control/protection: I.U.D.  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   Social Determinants of Radio broadcast assistant Strain: Not on Art therapist Insecurity: Not on file  Transportation Needs: Not on file  Physical Activity: Not on file  Stress: Not on file  Social Connections: Not on file  Intimate Partner Violence: Not on file    FAMILY HISTORY: Family History  Problem Relation Age of Onset   Allergies Mother    Ovarian cancer Mother 44       Also diagnosed with some blood cancer   Diabetes Maternal Grandmother    Heart disease Maternal Grandfather    Breast cancer Neg Hx     ALLERGIES:  is allergic to imitrex [sumatriptan], metformin and related, and penicillins.  MEDICATIONS:  Current Outpatient Medications  Medication Sig Dispense Refill   acetaminophen (TYLENOL) 500 MG tablet Take 2 tablets (1,000 mg total) by mouth every 6 (six) hours. 30 tablet 0   amLODIPine-Valsartan-HCTZ 10-160-25 MG TABS TAKE 1 TABLET BY MOUTH EVERY DAY 90 tablet 1   Blood Glucose Monitoring Suppl (ONETOUCH VERIO) w/Device KIT Please use to check blood sugar three times daily. E11.65 1 kit 0   Blood Pressure Monitoring (BLOOD PRESSURE CUFF) MISC Please provide whichever cuff is covered by patient's insurance 1 each 0    dapagliflozin propanediol (FARXIGA) 5 MG TABS tablet Take 1 tablet (5 mg total) by mouth daily. 90 tablet 3   glucose blood (ONETOUCH VERIO) test strip Please use to check blood sugar up to three times daily. E11.65 100 each 12   Lancet Devices (ONE TOUCH DELICA LANCING DEV) MISC Please use to check blood sugar up to three times daily. 1 each 0   Lasmiditan Succinate 100 MG TABS Take 200 mg by mouth daily as needed (severe migraine). Do not take more than once in a 24 hour period. 10 tablet 0   levothyroxine (SYNTHROID) 100 MCG tablet Take 1 tablet (100 mcg total) by mouth daily. 90 tablet 0   nadolol (CORGARD) 40 MG tablet Take 1 tablet (40 mg  total) by mouth at bedtime. 90 tablet 0   OneTouch Delica Lancets 57Q MISC Please use to check blood sugar up to three times daily. E11.65 100 each 12   Semaglutide, 1 MG/DOSE, (OZEMPIC, 1 MG/DOSE,) 4 MG/3ML SOPN Inject 2 mg into the skin once a week. 3 mL 4   No current facility-administered medications for this visit.    REVIEW OF SYSTEMS:   Constitutional: Denies fevers, chills or abnormal night sweats Eyes: Denies blurriness of vision, double vision or watery eyes Ears, nose, mouth, throat, and face: Denies mucositis or sore throat Respiratory: Denies cough, dyspnea or wheezes Cardiovascular: Denies palpitation, chest discomfort or lower extremity swelling Gastrointestinal:  Denies nausea, heartburn or change in bowel habits Skin: Denies abnormal skin rashes Lymphatics: Denies new lymphadenopathy or easy bruising Neurological:Denies numbness, tingling or new weaknesses Behavioral/Psych: Mood is stable, no new changes  All other systems were reviewed with the patient and are negative.  PHYSICAL EXAMINATION: ECOG PERFORMANCE STATUS: 1 - Symptomatic but completely ambulatory  Vitals:   05/10/22 0917  BP: 129/84  Pulse: 81  Resp: 16  Temp: 98.5 F (36.9 C)  SpO2: 100%   Filed Weights   05/10/22 0917  Weight: 184 lb 9.6 oz (83.7 kg)     GENERAL:alert, no distress and comfortable SKIN: skin color, texture, turgor are normal, no rashes or significant lesions EYES: normal, conjunctiva are pink and non-injected, sclera clear OROPHARYNX:no exudate, normal lips, buccal mucosa, and tongue  NECK: supple, thyroid normal size, non-tender, without nodularity LYMPH:  no palpable lymphadenopathy in the cervical, axillary or inguinal.  Pacifically, I am not able to detect any fullness in the left axilla LUNGS: clear to auscultation and percussion with normal breathing effort HEART: regular rate & rhythm and no murmurs without lower extremity edema ABDOMEN:abdomen soft, non-tender and normal bowel sounds Musculoskeletal:no cyanosis of digits and no clubbing  PSYCH: alert & oriented x 3 with fluent speech NEURO: no focal motor/sensory deficits  LABORATORY DATA:  I have reviewed the data as listed Lab Results  Component Value Date   WBC 2.8 (L) 05/10/2022   HGB 12.6 05/10/2022   HCT 35.9 (L) 05/10/2022   MCV 89.5 05/10/2022   PLT 229 05/10/2022   Recent Labs    09/14/21 1651 05/10/22 0942  NA 138 137  K 4.7 4.1  CL 102 107  CO2 25 26  GLUCOSE 100* 100*  BUN 12 15  CREATININE 0.99 1.26*  CALCIUM 9.4 9.0  GFRNONAA  --  56*  PROT  --  7.1  ALBUMIN  --  3.8  AST  --  16  ALT  --  21  ALKPHOS  --  59  BILITOT  --  0.4    RADIOGRAPHIC STUDIES: I have personally reviewed the radiological images as listed and agreed with the findings in the report. MM 3D SCREEN BREAST BILATERAL  Result Date: 04/28/2022 CLINICAL DATA:  Screening. EXAM: DIGITAL SCREENING BILATERAL MAMMOGRAM WITH TOMOSYNTHESIS AND CAD TECHNIQUE: Bilateral screening digital craniocaudal and mediolateral oblique mammograms were obtained. Bilateral screening digital breast tomosynthesis was performed. The images were evaluated with computer-aided detection. COMPARISON:  Previous exam(s). ACR Breast Density Category b: There are scattered areas of  fibroglandular density. FINDINGS: There are no findings suspicious for malignancy. IMPRESSION: No mammographic evidence of malignancy. A result letter of this screening mammogram will be mailed directly to the patient. RECOMMENDATION: Screening mammogram at age 55. (Code:SM-B-40A) BI-RADS CATEGORY  1: Negative. Electronically Signed   By: Roxy Horseman.D.  On: 04/28/2022 13:55   ASSESSMENT & PLAN:  History of Hodgkin's lymphoma Her examination is benign I have reviewed test results with the patient Her sedimentation is somewhat elevated and she is mildly leukopenic These are nonspecific findings The patient thought she might be coming down with a viral illness I recommend repeat blood work and exam in 6 weeks She is in agreement  Vitamin D deficiency She has been taking ibuprofen on a regular basis Vitamin D level is very low I warned the patient that very low vitamin D can cause diffuse musculoskeletal pain I recommend over-the-counter vitamin D supplement I plan to see her again in 6 weeks for further follow-up  Leukopenia She has remote history of leukopenia Over the last few years, her WBC was normal She has recurrent leukopenia again, could be related to viral illness versus nutritional deficiencies such as vitamin B12 deficiency I plan to recheck CBC as well as B12 level in her next visit  Elevated serum creatinine Her serum creatinine is high I suspect she is dehydrated She is also taking a diuretic therapy I recommend increase oral fluid intake and to consider taking her diuretic therapy every other day I plan to recheck creatinine level again in 6 weeks  Orders Placed This Encounter  Procedures   CBC with Differential ([facility] Only)    Standing Status:   Future    Number of Occurrences:   1    Standing Expiration Date:   05/11/2023   CMP ([place] only)    Standing Status:   Future    Number of Occurrences:   1    Standing Expiration Date:   05/11/2023    Sedimentation rate    Standing Status:   Future    Number of Occurrences:   1    Standing Expiration Date:   05/11/2023   VITAMIN D 25 Hydroxy (Vit-D Deficiency, Fractures)    Standing Status:   Future    Number of Occurrences:   1    Standing Expiration Date:   05/11/2023   Lactate dehydrogenase    Standing Status:   Future    Number of Occurrences:   1    Standing Expiration Date:   05/11/2023   CBC with Differential (Cancer Center Only)    Standing Status:   Future    Standing Expiration Date:   05/11/2023   CMP ([place] only)    Standing Status:   Future    Standing Expiration Date:   05/11/2023   Vitamin B12    Standing Status:   Future    Standing Expiration Date:   05/11/2023   VITAMIN D 25 Hydroxy (Vit-D Deficiency, Fractures)    Standing Status:   Future    Standing Expiration Date:   05/11/2023   Sedimentation rate    Standing Status:   Future    Standing Expiration Date:   05/11/2023    All questions were answered. The patient knows to call the clinic with any problems, questions or concerns. The total time spent in the appointment was 40 minutes encounter with patients including review of chart and various tests results, discussions about plan of care and coordination of care plan   Heath Lark, MD 05/10/2022 3:23 PM

## 2022-05-10 NOTE — Assessment & Plan Note (Addendum)
Her examination is benign I have reviewed test results with the patient Her sedimentation is somewhat elevated and she is mildly leukopenic These are nonspecific findings The patient thought she might be coming down with a viral illness I recommend repeat blood work and exam in 6 weeks She is in agreement

## 2022-05-10 NOTE — Assessment & Plan Note (Signed)
She has been taking ibuprofen on a regular basis Vitamin D level is very low I warned the patient that very low vitamin D can cause diffuse musculoskeletal pain I recommend over-the-counter vitamin D supplement I plan to see her again in 6 weeks for further follow-up

## 2022-05-10 NOTE — Assessment & Plan Note (Signed)
Her serum creatinine is high I suspect she is dehydrated She is also taking a diuretic therapy I recommend increase oral fluid intake and to consider taking her diuretic therapy every other day I plan to recheck creatinine level again in 6 weeks

## 2022-05-10 NOTE — Assessment & Plan Note (Signed)
She has remote history of leukopenia Over the last few years, her WBC was normal She has recurrent leukopenia again, could be related to viral illness versus nutritional deficiencies such as vitamin B12 deficiency I plan to recheck CBC as well as B12 level in her next visit

## 2022-05-11 ENCOUNTER — Telehealth: Payer: Self-pay | Admitting: Hematology and Oncology

## 2022-05-11 NOTE — Telephone Encounter (Signed)
.  Called patient to schedule appointment per 6/13 inbasket, patient is aware of date and time.   

## 2022-05-20 ENCOUNTER — Ambulatory Visit (INDEPENDENT_AMBULATORY_CARE_PROVIDER_SITE_OTHER): Payer: No Typology Code available for payment source | Admitting: Family Medicine

## 2022-05-20 VITALS — BP 117/85 | HR 97 | Temp 99.3°F | Ht 67.0 in | Wt 180.1 lb

## 2022-05-20 DIAGNOSIS — N289 Disorder of kidney and ureter, unspecified: Secondary | ICD-10-CM

## 2022-05-20 DIAGNOSIS — B349 Viral infection, unspecified: Secondary | ICD-10-CM

## 2022-05-20 DIAGNOSIS — I152 Hypertension secondary to endocrine disorders: Secondary | ICD-10-CM

## 2022-05-20 DIAGNOSIS — E1159 Type 2 diabetes mellitus with other circulatory complications: Secondary | ICD-10-CM | POA: Diagnosis not present

## 2022-05-20 DIAGNOSIS — J019 Acute sinusitis, unspecified: Secondary | ICD-10-CM | POA: Insufficient documentation

## 2022-05-20 DIAGNOSIS — J0110 Acute frontal sinusitis, unspecified: Secondary | ICD-10-CM | POA: Diagnosis not present

## 2022-05-20 MED ORDER — DOXYCYCLINE HYCLATE 100 MG PO TABS: 100.0000 mg | ORAL_TABLET | Freq: Two times a day (BID) | ORAL | 0 refills | Status: AC

## 2022-05-20 MED ORDER — FLUTICASONE PROPIONATE 50 MCG/ACT NA SUSP: 2.0000 | Freq: Every day | NASAL | 6 refills | Status: AC

## 2022-05-21 ENCOUNTER — Other Ambulatory Visit: Payer: Self-pay | Admitting: Family Medicine

## 2022-05-21 DIAGNOSIS — I152 Hypertension secondary to endocrine disorders: Secondary | ICD-10-CM

## 2022-05-21 LAB — COVID-19, FLU A+B NAA
Influenza A, NAA: NOT DETECTED
Influenza B, NAA: NOT DETECTED
SARS-CoV-2, NAA: NOT DETECTED

## 2022-05-21 LAB — BASIC METABOLIC PANEL
BUN/Creatinine Ratio: 9 (ref 9–23)
BUN: 11 mg/dL (ref 6–20)
CO2: 22 mmol/L (ref 20–29)
Calcium: 9.1 mg/dL (ref 8.7–10.2)
Chloride: 99 mmol/L (ref 96–106)
Creatinine, Ser: 1.2 mg/dL — ABNORMAL HIGH (ref 0.57–1.00)
Glucose: 133 mg/dL — ABNORMAL HIGH (ref 70–99)
Potassium: 3.9 mmol/L (ref 3.5–5.2)
Sodium: 136 mmol/L (ref 134–144)
eGFR: 59 mL/min/{1.73_m2} — ABNORMAL LOW (ref >59)

## 2022-06-03 ENCOUNTER — Encounter: Payer: Self-pay | Admitting: Family Medicine

## 2022-06-03 ENCOUNTER — Ambulatory Visit (INDEPENDENT_AMBULATORY_CARE_PROVIDER_SITE_OTHER): Payer: No Typology Code available for payment source | Admitting: Family Medicine

## 2022-06-03 VITALS — BP 135/101 | HR 88 | Ht 67.0 in | Wt 184.2 lb

## 2022-06-03 DIAGNOSIS — I10 Essential (primary) hypertension: Secondary | ICD-10-CM | POA: Diagnosis not present

## 2022-06-03 DIAGNOSIS — Z975 Presence of (intrauterine) contraceptive device: Secondary | ICD-10-CM | POA: Diagnosis not present

## 2022-06-03 NOTE — Progress Notes (Signed)
    SUBJECTIVE:   CHIEF COMPLAINT / HPI:   Hypertension: Patient is a 41 y.o. female who presents today for HTN follow-up.  Home medications include: valsartan-amlodipine-HCTZ  Patient reports *** compliance.  Patient {rwdoesdoesnot:[id number]} check blood pressure at home.  Patient {HAS HAS JOI:[id number]} had a BMP in the past 1 year.   PERTINENT  PMH / PSH: ***  OBJECTIVE:   BP (!) 137/95   Pulse 88   Ht '5\' 7"'$  (1.702 m)   Wt 184 lb 3.2 oz (83.6 kg)   SpO2 100%   BMI 28.85 kg/m   ***  ASSESSMENT/PLAN:   No problem-specific Assessment & Plan notes found for this encounter.     Alcus Dad, MD [place]

## 2022-06-03 NOTE — Patient Instructions (Addendum)
It was great to see you!  Please take your blood pressure medicine EVERY DAY. If your kidney numbers are elevated at your next visit with the oncologist, I will take off the HCTZ component of your blood pressure medicine.  Please stay well hydrated in the meantime.  I will see you in 1 month for diabetes and blood pressure follow up.   GET YOUR IUD REMOVED!  -Dr Rock Nephew

## 2022-06-04 NOTE — Assessment & Plan Note (Addendum)
Mirena placed June 2014- overdue for removal. Strings unable to be visualized on past exams but imaging has confirmed normal location. Once again reminded patient to contact OBGYN for removal. Patient knows to use alternate form of contraception.

## 2022-06-04 NOTE — Assessment & Plan Note (Signed)
Longstanding HTN since age 41 that has been somewhat resistant to medication. Adherence has also been a challenge over the years, hence the triple combo pill. Advised patient to take BP medication daily. Slight rise in creatinine on recent labs is more likely to represent chronic kidney disease as a result of years of poorly controlled HTN and DM. Does not meet criteria for AKI. Has upcoming CMP in 2 weeks- if SCr persistently elevated can consider d/c HCTZ portion and adjust beta blocker.

## 2022-06-21 ENCOUNTER — Other Ambulatory Visit: Payer: Self-pay

## 2022-06-21 ENCOUNTER — Inpatient Hospital Stay: Payer: No Typology Code available for payment source | Attending: Hematology and Oncology

## 2022-06-21 DIAGNOSIS — Z8571 Personal history of Hodgkin lymphoma: Secondary | ICD-10-CM | POA: Insufficient documentation

## 2022-06-21 DIAGNOSIS — R700 Elevated erythrocyte sedimentation rate: Secondary | ICD-10-CM | POA: Diagnosis not present

## 2022-06-21 DIAGNOSIS — R7989 Other specified abnormal findings of blood chemistry: Secondary | ICD-10-CM | POA: Insufficient documentation

## 2022-06-21 DIAGNOSIS — E559 Vitamin D deficiency, unspecified: Secondary | ICD-10-CM | POA: Diagnosis not present

## 2022-06-21 DIAGNOSIS — D72819 Decreased white blood cell count, unspecified: Secondary | ICD-10-CM

## 2022-06-21 LAB — CBC WITH DIFFERENTIAL (CANCER CENTER ONLY)
Abs Immature Granulocytes: 0.01 10*3/uL (ref 0.00–0.07)
Basophils Absolute: 0.1 10*3/uL (ref 0.0–0.1)
Basophils Relative: 1 %
Eosinophils Absolute: 0.2 10*3/uL (ref 0.0–0.5)
Eosinophils Relative: 5 %
HCT: 34.8 % — ABNORMAL LOW (ref 36.0–46.0)
Hemoglobin: 12.1 g/dL (ref 12.0–15.0)
Immature Granulocytes: 0 %
Lymphocytes Relative: 39 %
Lymphs Abs: 1.6 10*3/uL (ref 0.7–4.0)
MCH: 31.8 pg (ref 26.0–34.0)
MCHC: 34.8 g/dL (ref 30.0–36.0)
MCV: 91.3 fL (ref 80.0–100.0)
Monocytes Absolute: 0.4 10*3/uL (ref 0.1–1.0)
Monocytes Relative: 10 %
Neutro Abs: 1.8 10*3/uL (ref 1.7–7.7)
Neutrophils Relative %: 45 %
Platelet Count: 229 10*3/uL (ref 150–400)
RBC: 3.81 MIL/uL — ABNORMAL LOW (ref 3.87–5.11)
RDW: 13.2 % (ref 11.5–15.5)
WBC Count: 4.1 10*3/uL (ref 4.0–10.5)
nRBC: 0 % (ref 0.0–0.2)

## 2022-06-21 LAB — CMP (CANCER CENTER ONLY)
ALT: 14 U/L (ref 0–44)
AST: 14 U/L — ABNORMAL LOW (ref 15–41)
Albumin: 3.8 g/dL (ref 3.5–5.0)
Alkaline Phosphatase: 61 U/L (ref 38–126)
Anion gap: 4 — ABNORMAL LOW (ref 5–15)
BUN: 25 mg/dL — ABNORMAL HIGH (ref 6–20)
CO2: 27 mmol/L (ref 22–32)
Calcium: 8.9 mg/dL (ref 8.9–10.3)
Chloride: 106 mmol/L (ref 98–111)
Creatinine: 1.35 mg/dL — ABNORMAL HIGH (ref 0.44–1.00)
GFR, Estimated: 52 mL/min — ABNORMAL LOW (ref >60)
Glucose, Bld: 108 mg/dL — ABNORMAL HIGH (ref 70–99)
Potassium: 4.6 mmol/L (ref 3.5–5.1)
Sodium: 137 mmol/L (ref 135–145)
Total Bilirubin: 0.5 mg/dL (ref 0.3–1.2)
Total Protein: 7.1 g/dL (ref 6.5–8.1)

## 2022-06-21 LAB — VITAMIN D 25 HYDROXY (VIT D DEFICIENCY, FRACTURES): Vit D, 25-Hydroxy: 8.43 ng/mL — ABNORMAL LOW (ref 30–100)

## 2022-06-21 LAB — SEDIMENTATION RATE: Sed Rate: 36 mm/hr — ABNORMAL HIGH (ref 0–22)

## 2022-06-21 LAB — VITAMIN B12: Vitamin B-12: 409 pg/mL (ref 180–914)

## 2022-06-23 ENCOUNTER — Other Ambulatory Visit: Payer: Self-pay

## 2022-06-23 ENCOUNTER — Inpatient Hospital Stay (HOSPITAL_BASED_OUTPATIENT_CLINIC_OR_DEPARTMENT_OTHER): Payer: No Typology Code available for payment source | Admitting: Hematology and Oncology

## 2022-06-23 ENCOUNTER — Encounter: Payer: Self-pay | Admitting: Hematology and Oncology

## 2022-06-23 DIAGNOSIS — Z8571 Personal history of Hodgkin lymphoma: Secondary | ICD-10-CM | POA: Diagnosis not present

## 2022-06-23 DIAGNOSIS — R7989 Other specified abnormal findings of blood chemistry: Secondary | ICD-10-CM

## 2022-06-23 DIAGNOSIS — E559 Vitamin D deficiency, unspecified: Secondary | ICD-10-CM

## 2022-06-23 DIAGNOSIS — R700 Elevated erythrocyte sedimentation rate: Secondary | ICD-10-CM | POA: Diagnosis not present

## 2022-06-23 NOTE — Assessment & Plan Note (Signed)
She has severe vitamin D deficiency I recommend the patient to take 5000 units daily I recommend her to get her labs rechecked in 6 months

## 2022-06-23 NOTE — Progress Notes (Signed)
[facility] OFF[facility] PROGRESS NOTE  Patient Care Team: Alcus Dad, MD as PCP - General (Family Medicine)  ASSESSMENT & PLAN:  History of Hodgkin's lymphoma Her examination is benign I have reviewed test results with the patient Her sedimentation is somewhat elevated but this is not related to cancer Overall, there is no indication for imaging study I welcomed the patient to come back to see me if she have concerns again Otherwise, she does not need long-term follow-up  Vitamin D deficiency She has severe vitamin D deficiency I recommend the patient to take 5000 units daily I recommend her to get her labs rechecked in 6 months  Elevated serum creatinine She is not hydrating enough We discussed importance of adequate hydration and I recommend the patient to aim for 90 ounces of fluid per day  No orders of the defined types were placed in this encounter.   All questions were answered. The patient knows to call the clinic with any problems, questions or concerns. The total time spent in the appointment was 20 minutes encounter with patients including review of chart and various tests results, discussions about plan of care and coordination of care plan   Heath Lark, MD 06/23/2022 9:11 AM  INTERVAL HISTORY: Please see below for problem oriented charting. she returns for surveillance follow-up for history of lymphoma She is still exercising on a regular basis and losing weight She has lost motivation recently and felt fatigued No new lymphadenopathy No recent signs or symptoms to suggest active infection  REVIEW OF SYSTEMS:   Constitutional: Denies fevers, chills or abnormal weight loss Eyes: Denies blurriness of vision Ears, nose, mouth, throat, and face: Denies mucositis or sore throat Respiratory: Denies cough, dyspnea or wheezes Cardiovascular: Denies palpitation, chest discomfort or lower extremity swelling Gastrointestinal:  Denies nausea, heartburn or  change in bowel habits Skin: Denies abnormal skin rashes Lymphatics: Denies new lymphadenopathy or easy bruising Neurological:Denies numbness, tingling or new weaknesses Behavioral/Psych: Mood is stable, no new changes  All other systems were reviewed with the patient and are negative.  I have reviewed the past medical history, past surgical history, social history and family history with the patient and they are unchanged from previous note.  ALLERGIES:  is allergic to imitrex [sumatriptan], metformin and related, and penicillins.  MEDICATIONS:  Current Outpatient Medications  Medication Sig Dispense Refill   cholecalciferol (VITAMIN D3) 25 MCG (1000 UNIT) tablet Take 5,000 Units by mouth daily.     acetaminophen (TYLENOL) 500 MG tablet Take 2 tablets (1,000 mg total) by mouth every 6 (six) hours. 30 tablet 0   amLODIPine-Valsartan-HCTZ 10-160-25 MG TABS TAKE 1 TABLET BY MOUTH EVERY DAY 90 tablet 1   Blood Glucose Monitoring Suppl (ONETOUCH VERIO) w/Device KIT Please use to check blood sugar three times daily. E11.65 1 kit 0   Blood Pressure Monitoring (BLOOD PRESSURE CUFF) MISC Please provide whichever cuff is covered by patient's insurance 1 each 0   dapagliflozin propanediol (FARXIGA) 5 MG TABS tablet Take 1 tablet (5 mg total) by mouth daily. 90 tablet 3   fluticasone (FLONASE) 50 MCG/ACT nasal spray Place 2 sprays into both nostrils daily. 16 g 6   glucose blood (ONETOUCH VERIO) test strip Please use to check blood sugar up to three times daily. E11.65 100 each 12   Lancet Devices (ONE TOUCH DELICA LANCING DEV) MISC Please use to check blood sugar up to three times daily. 1 each 0   Lasmiditan Succinate 100 MG TABS Take 200  mg by mouth daily as needed (severe migraine). Do not take more than once in a 24 hour period. 10 tablet 0   levothyroxine (SYNTHROID) 100 MCG tablet Take 1 tablet (100 mcg total) by mouth daily. 90 tablet 0   OneTouch Delica Lancets 66A MISC Please use to check  blood sugar up to three times daily. E11.65 100 each 12   Semaglutide, 1 MG/DOSE, (OZEMPIC, 1 MG/DOSE,) 4 MG/3ML SOPN Inject 2 mg into the skin once a week. 3 mL 4   No current facility-administered medications for this visit.    SUMMARY OF ONCOLOGIC HISTORY: Oncology History Overview Note  Hodgkin's lymphoma   Primary site: Lymphoid Neoplasms (Left)   Staging method: AJCC 6th Edition   Clinical: Stage II   Summary: Stage II     History of Hodgkin's lymphoma  01/11/2012 Procedure   [id number] Left axillary LN biopsy showed Hodgkin lymphoma   01/24/2012 Imaging   Ct scan of chest, abdomen and pelvis & PET scan showed bulky left axillary LN, supraclavicular lymphadenoapthy and medistinal involvement   02/03/2012 - 05/11/2012 Chemotherapy   She completed 4 cycles of ABVD   05/28/2012 Imaging   PET scan showed partial response   06/18/2012 - 07/16/2012 Radiation Therapy   She completed radiation treatment   07/27/2012 Imaging   CT scan of chest showed pneumonia   03/28/2013 Imaging   Ct chest showed no recurrence   04/15/2013 Imaging   Ct angiogram showed pneumonia   06/04/2013 Imaging   PET scan showed complete response   08/06/2013 Imaging   Ct scan showed no recurrence   02/04/2014 Imaging   Ct chest showed no recurrence   07/31/2014 Imaging   Ct chest showed no recurrence   05/01/2020 Imaging   Ct neck Prominent inferior right intraparotid node measuring 0.6 cm. Otherwise no cervical adenopathy or mass lesion.  CT chest, abdomen and pelvis 1. Stable exam. No new or progressive interval findings. Specifically, no evidence for recurrent lymphadenopathy in the chest, abdomen, or pelvis. 2. Stable volume loss and scarring in the left lung presumably related to sequelae of prior therapy.     PHYSICAL EXAMINATION: ECOG PERFORMANCE STATUS: 0 - Asymptomatic  Vitals:   06/23/22 0904  BP: 122/78  Pulse: 81  Resp: 18  Temp: 98.1 F (36.7 C)  SpO2: 100%   Filed Weights    06/23/22 0904  Weight: 177 lb 6.4 oz (80.5 kg)    GENERAL:alert, no distress and comfortable SKIN: skin color, texture, turgor are normal, no rashes or significant lesions EYES: normal, Conjunctiva are pink and non-injected, sclera clear OROPHARYNX:no exudate, no erythema and lips, buccal mucosa, and tongue normal  NECK: supple, thyroid normal size, non-tender, without nodularity LYMPH:  no palpable lymphadenopathy in the cervical, axillary or inguinal LUNGS: clear to auscultation and percussion with normal breathing effort HEART: regular rate & rhythm and no murmurs and no lower extremity edema ABDOMEN:abdomen soft, non-tender and normal bowel sounds Musculoskeletal:no cyanosis of digits and no clubbing  NEURO: alert & oriented x 3 with fluent speech, no focal motor/sensory deficits  LABORATORY DATA:  I have reviewed the data as listed    Component Value Date/Time   NA 137 06/21/2022 1151   NA 136 05/20/2022 1217   NA 134 (L) 04/13/2017 1346   K 4.6 06/21/2022 1151   K 4.5 04/13/2017 1346   CL 106 06/21/2022 1151   CL 106 03/28/2013 0954   CO2 27 06/21/2022 1151   CO2 26 04/13/2017 1346  GLUCOSE 108 (H) 06/21/2022 1151   GLUCOSE 410 (H) 04/13/2017 1346   GLUCOSE 109 (H) 03/28/2013 0954   BUN 25 (H) 06/21/2022 1151   BUN 11 05/20/2022 1217   BUN 11.9 04/13/2017 1346   CREATININE 1.35 (H) 06/21/2022 1151   CREATININE 1.0 04/13/2017 1346   CALCIUM 8.9 06/21/2022 1151   CALCIUM 9.3 04/13/2017 1346   PROT 7.1 06/21/2022 1151   PROT 7.4 01/18/2018 1119   PROT 7.3 04/13/2017 1346   ALBUMIN 3.8 06/21/2022 1151   ALBUMIN 4.4 01/18/2018 1119   ALBUMIN 3.8 04/13/2017 1346   AST 14 (L) 06/21/2022 1151   AST 11 04/13/2017 1346   ALT 14 06/21/2022 1151   ALT 16 04/13/2017 1346   ALKPHOS 61 06/21/2022 1151   ALKPHOS 79 04/13/2017 1346   BILITOT 0.5 06/21/2022 1151   BILITOT 0.62 04/13/2017 1346   GFRNONAA 52 (L) 06/21/2022 1151   GFRAA 92 12/04/2020 1608   GFRAA >60  04/23/2020 1314    No results found for: "SPEP", "UPEP"  Lab Results  Component Value Date   WBC 4.1 06/21/2022   NEUTROABS 1.8 06/21/2022   HGB 12.1 06/21/2022   HCT 34.8 (L) 06/21/2022   MCV 91.3 06/21/2022   PLT 229 06/21/2022      Chemistry      Component Value Date/Time   NA 137 06/21/2022 1151   NA 136 05/20/2022 1217   NA 134 (L) 04/13/2017 1346   K 4.6 06/21/2022 1151   K 4.5 04/13/2017 1346   CL 106 06/21/2022 1151   CL 106 03/28/2013 0954   CO2 27 06/21/2022 1151   CO2 26 04/13/2017 1346   BUN 25 (H) 06/21/2022 1151   BUN 11 05/20/2022 1217   BUN 11.9 04/13/2017 1346   CREATININE 1.35 (H) 06/21/2022 1151   CREATININE 1.0 04/13/2017 1346      Component Value Date/Time   CALCIUM 8.9 06/21/2022 1151   CALCIUM 9.3 04/13/2017 1346   ALKPHOS 61 06/21/2022 1151   ALKPHOS 79 04/13/2017 1346   AST 14 (L) 06/21/2022 1151   AST 11 04/13/2017 1346   ALT 14 06/21/2022 1151   ALT 16 04/13/2017 1346   BILITOT 0.5 06/21/2022 1151   BILITOT 0.62 04/13/2017 1346

## 2022-06-23 NOTE — Assessment & Plan Note (Signed)
She is not hydrating enough We discussed importance of adequate hydration and I recommend the patient to aim for 90 ounces of fluid per day

## 2022-06-23 NOTE — Assessment & Plan Note (Signed)
Her examination is benign I have reviewed test results with the patient Her sedimentation is somewhat elevated but this is not related to cancer Overall, there is no indication for imaging study I welcomed the patient to come back to see me if she have concerns again Otherwise, she does not need long-term follow-up

## 2022-07-01 ENCOUNTER — Encounter: Payer: Self-pay | Admitting: Family Medicine

## 2022-07-01 ENCOUNTER — Other Ambulatory Visit (HOSPITAL_COMMUNITY): Payer: Self-pay

## 2022-07-01 ENCOUNTER — Ambulatory Visit (INDEPENDENT_AMBULATORY_CARE_PROVIDER_SITE_OTHER): Payer: No Typology Code available for payment source | Admitting: Family Medicine

## 2022-07-01 VITALS — BP 122/94 | HR 83 | Ht 67.0 in | Wt 178.2 lb

## 2022-07-01 DIAGNOSIS — Z975 Presence of (intrauterine) contraceptive device: Secondary | ICD-10-CM

## 2022-07-01 DIAGNOSIS — E119 Type 2 diabetes mellitus without complications: Secondary | ICD-10-CM

## 2022-07-01 DIAGNOSIS — I10 Essential (primary) hypertension: Secondary | ICD-10-CM

## 2022-07-01 DIAGNOSIS — R7989 Other specified abnormal findings of blood chemistry: Secondary | ICD-10-CM | POA: Diagnosis not present

## 2022-07-01 DIAGNOSIS — E669 Obesity, unspecified: Secondary | ICD-10-CM

## 2022-07-01 DIAGNOSIS — E1165 Type 2 diabetes mellitus with hyperglycemia: Secondary | ICD-10-CM

## 2022-07-01 DIAGNOSIS — N289 Disorder of kidney and ureter, unspecified: Secondary | ICD-10-CM

## 2022-07-01 MED ORDER — OZEMPIC (1 MG/DOSE) 4 MG/3ML ~~LOC~~ SOPN
2.0000 mg | PEN_INJECTOR | SUBCUTANEOUS | 1 refills | Status: DC
  Filled 2022-07-01: qty 3, 14d supply, fill #0

## 2022-07-01 NOTE — Progress Notes (Signed)
    SUBJECTIVE:   CHIEF COMPLAINT / HPI:   Kidney Concerns, HTN -Creatinine has been elevated on multiple BMPs recently -Initially oncologist thought maybe related to BP meds -Patient has been on valsartan and HCTZ "forever" -Patient thinks it's more likely related to inadequate hydration -Endorses a lot of stressors, hasn't been eating/drinking as well as she should -Would like to rehydrate and check labs again at a future visit -Reports good compliance with her amlodipine-valsartan-HCTZ 10-160-'25mg'$  -Does not check BP at home   PERTINENT  PMH / PSH: T2DM, h/o Hodgkin's lymphoma, depression  OBJECTIVE:   BP (!) 122/94   Pulse 83   Ht '5\' 7"'$  (1.702 m)   Wt 178 lb 3.2 oz (80.8 kg)   SpO2 100%   BMI 27.91 kg/m   Gen: NAD, pleasant, able to participate in exam CV: RRR, normal M4/W8, II/VI systolic murmur Resp: Normal effort, lungs CTAB Extremities: no edema or cyanosis Skin: warm and dry, no rashes noted Neuro: alert, no obvious focal deficits Psych: Normal affect and mood  ASSESSMENT/PLAN:   Primary hypertension BP well controlled today. Continue current medications without change. Plan for repeat BMP at next visit  Elevated serum creatinine Thought to represent progression toward CKD with new baseline Cr ~1.2 as a result of years of inadequately controlled HTN and DM. Less likely AKI although suboptimal hydration may also be playing slight role. Check renal ultrasound. Repeat BMP at follow up.  IUD (intrauterine device) in place Mirena placed June 2014- overdue for removal. Strings unable to be visualized on past exams but imaging has confirmed normal location. Once again reminded patient to contact OBGYN for removal. Patient knows to use alternate form of contraception.   Stress Patient with significant life stress. Has a history of depression and anxiety which she feels are adequately controlled at the moment but that she would benefit from therapy/counseling for stress  management. She will reach out via MyChart if she's interested in seeing Dr Hartford Poli. Given [place] contact info in AVS as well.  Health Maintenance -Due for pap, patient declines today.  -Return in 2 months for diabetes visit, pap  Alcus Dad, MD [place]

## 2022-07-01 NOTE — Patient Instructions (Addendum)
It was lovely to see you, as always!  Continue your current medications without any changes for now. We will check an ultrasound of your kidneys.  HYDRATE and fuel your body with proper nutrition. We will repeat your kidney labs at a follow up visit.  See me at the end of October for a diabetes visit.  Therapy Resources: [facility] [place], [place] [address] [phone]  For urgent therapy (not medication) Walk in hours are 8-1pm Monday through Wednesday (please come at 7/730 to ensure you are seen)  CALL the center for women for IUD removal!! Use a back up contraception method in the meantime!  -Dr Rock Nephew

## 2022-07-03 NOTE — Assessment & Plan Note (Signed)
BP well controlled today. Continue current medications without change. Plan for repeat BMP at next visit

## 2022-07-03 NOTE — Assessment & Plan Note (Signed)
Mirena placed June 2014- overdue for removal. Strings unable to be visualized on past exams but imaging has confirmed normal location. Once again reminded patient to contact OBGYN for removal. Patient knows to use alternate form of contraception.

## 2022-07-03 NOTE — Assessment & Plan Note (Addendum)
Thought to represent progression toward CKD with new baseline Cr ~1.2 as a result of years of inadequately controlled HTN and DM. Less likely AKI although suboptimal hydration may also be playing slight role. Check renal ultrasound. Repeat BMP at follow up.

## 2022-07-07 ENCOUNTER — Ambulatory Visit
Admission: RE | Admit: 2022-07-07 | Discharge: 2022-07-07 | Disposition: A | Discharge status: Home / Self Care | Payer: No Typology Code available for payment source | Source: Ambulatory Visit | Attending: Family Medicine | Admitting: Family Medicine

## 2022-07-07 DIAGNOSIS — N179 Acute kidney failure, unspecified: Secondary | ICD-10-CM | POA: Diagnosis not present

## 2022-07-07 DIAGNOSIS — R7989 Other specified abnormal findings of blood chemistry: Secondary | ICD-10-CM | POA: Diagnosis not present

## 2022-09-30 ENCOUNTER — Encounter: Payer: Self-pay | Admitting: Family Medicine

## 2022-09-30 ENCOUNTER — Ambulatory Visit (INDEPENDENT_AMBULATORY_CARE_PROVIDER_SITE_OTHER): Payer: No Typology Code available for payment source | Admitting: Family Medicine

## 2022-09-30 VITALS — BP 126/82 | HR 73 | Wt 175.8 lb

## 2022-09-30 DIAGNOSIS — Z23 Encounter for immunization: Secondary | ICD-10-CM | POA: Diagnosis not present

## 2022-09-30 DIAGNOSIS — F411 Generalized anxiety disorder: Secondary | ICD-10-CM | POA: Diagnosis not present

## 2022-09-30 DIAGNOSIS — E119 Type 2 diabetes mellitus without complications: Secondary | ICD-10-CM | POA: Diagnosis not present

## 2022-09-30 DIAGNOSIS — R69 Illness, unspecified: Secondary | ICD-10-CM | POA: Diagnosis not present

## 2022-09-30 LAB — POCT GLYCOSYLATED HEMOGLOBIN (HGB A1C): HbA1c, POC (controlled diabetic range): 5.3 % (ref 0.0–7.0)

## 2022-09-30 MED ORDER — FLUOXETINE HCL 20 MG PO TABS: 20.0000 mg | ORAL_TABLET | Freq: Every day | ORAL | 0 refills | Status: DC

## 2022-09-30 MED ORDER — BUSPIRONE HCL 5 MG PO TABS: 5.0000 mg | ORAL_TABLET | Freq: Two times a day (BID) | ORAL | 2 refills | Status: DC | PRN

## 2022-09-30 MED ORDER — OZEMPIC (1 MG/DOSE) 4 MG/3ML ~~LOC~~ SOPN: 1.0000 mg | PEN_INJECTOR | SUBCUTANEOUS | 1 refills | Status: DC

## 2022-09-30 NOTE — Progress Notes (Signed)
    SUBJECTIVE:   CHIEF COMPLAINT / HPI:   Type 2 Diabetes Patient is a 41 y.o. female who presents today for diabetes follow-up.  Home medications include: Ozempic 2mg  weekly, Farxiga 5mg  daily Has had difficulty getting her Ozempic due to supply shortage so she missed 3 weeks. Otherwise good medication compliance. Patient does not check sugars at home No hypoglycemic episodes/symptoms  Most recent A1Cs:  Lab Results  Component Value Date   HGBA1C 5.3 09/30/2022   HGBA1C 5.3 03/22/2022   HGBA1C 5.9 09/14/2021   Last Microalbumin, LDL, Creatinine: Lab Results  Component Value Date   MICROALBUR 150 07/11/2019   LDLCALC 141 (H) 09/14/2021   CREATININE 1.35 (H) 06/21/2022    Patient is up to date on diabetic eye. Patient is not up to date on diabetic foot exam.  Anxiety Reports significant worsening of her anxiety recently Her Grandpa was diagnosed with stage 4 lung cancer She's taking on a lot of the burden and responsibility for her family Very high stress at work as well Not sleeping or eating well Previously on Zoloft for depression and anxiety-- felt it made her sleepy Per notes: also took Prozac and Buspar in the past Had been doing okay off medication for a while Not in counseling/therapy  PERTINENT  PMH / PSH: hodgkin lymphoma  OBJECTIVE:   BP 126/82   Pulse 73   Wt 175 lb 12.8 oz (79.7 kg)   SpO2 97%   BMI 27.53 kg/m   General: NAD, pleasant, able to participate in exam Respiratory: No respiratory distress Skin: warm and dry, no rashes noted Psych: Normal affect and mood Neuro: grossly intact Diabetic foot exam was performed with the following findings:   No deformities, ulcerations, or other skin breakdown Normal sensation of 10g monofilament Intact posterior tibialis and dorsalis pedis pulses     ASSESSMENT/PLAN:   Type 2 diabetes mellitus without complication, without long-term current use of insulin (HCC) Very well-controlled. A1c 5.3%  today. Her A1c has consistently been in the prediabetic or normal range for the past 1 year. Congratulated patient. -Continue Farxiga  -Decrease Ozempic to 1mg  weekly. Would be OK to stop altogether but patient prefers to continue at reduced dose for now which is reasonable -Foot exam performed today & was normal -UTD on eye exam -Return for lab visit for BMP and urine microalbumin -On ARB -Next A1c in 6 months  Generalized anxiety disorder Worse recently in the setting of life stressors. Now interfering with sleep, appetite, daily function. Was on prozac and buspar in the past but had done well off medication for the last few years. -Start prozac 20mg  daily -Start buspar 5mg  BID prn -Discussed potential SSRI side effects including GI upset, sexual dysfunction, increased anxiety, and SI -Discussed that it can take 6-8 weeks to reach full efficacy -Discussed synergistic effects of medications and therapy    Maury Dus, MD [facility]

## 2022-09-30 NOTE — Patient Instructions (Addendum)
It was great to see you!  Things we discussed: -Your A1c was 5.3% which is EXCELLENT. I have sent the lower dose of Ozempic ('1mg'$  weekly). If the pharmacy is out of stock, it's perfectly fine to stop this medication. Continue your farxiga -Next A1c in 6 months  -We will start prozac to help with anxiety. It can take up to 6 weeks for this to take full effect.  -In the meantime, you can also take Buspar '5mg'$  once or twice daily as needed for significant anxiety. -I will send these to your pharmacy by the end of the weekend  Come back for a lab visit to check your kidney function and urine.  I will schedule an appointment with the OBGYN and let you know the details via MyChart.  Take care, Dr Rock Nephew

## 2022-10-02 NOTE — Assessment & Plan Note (Signed)
Very well-controlled. A1c 5.3% today. Her A1c has consistently been in the prediabetic or normal range for the past 1 year. Congratulated patient. -Continue Farxiga  -Decrease Ozempic to '1mg'$  weekly. Would be OK to stop altogether but patient prefers to continue at reduced dose for now which is reasonable -Foot exam performed today & was normal -UTD on eye exam -Return for lab visit for BMP and urine microalbumin -On ARB -Next A1c in 6 months

## 2022-10-02 NOTE — Assessment & Plan Note (Signed)
Worse recently in the setting of life stressors. Now interfering with sleep, appetite, daily function. Was on prozac and buspar in the past but had done well off medication for the last few years. -Start prozac '20mg'$  daily -Start buspar '5mg'$  BID prn -Discussed potential SSRI side effects including GI upset, sexual dysfunction, increased anxiety, and SI -Discussed that it can take 6-8 weeks to reach full efficacy -Discussed synergistic effects of medications and therapy

## 2022-10-06 ENCOUNTER — Other Ambulatory Visit: Payer: No Typology Code available for payment source

## 2022-10-10 ENCOUNTER — Other Ambulatory Visit: Payer: No Typology Code available for payment source

## 2022-10-10 DIAGNOSIS — E119 Type 2 diabetes mellitus without complications: Secondary | ICD-10-CM

## 2022-10-11 LAB — BASIC METABOLIC PANEL
BUN/Creatinine Ratio: 10 (ref 9–23)
BUN: 11 mg/dL (ref 6–20)
CO2: 24 mmol/L (ref 20–29)
Calcium: 8.8 mg/dL (ref 8.7–10.2)
Chloride: 106 mmol/L (ref 96–106)
Creatinine, Ser: 1.08 mg/dL — ABNORMAL HIGH (ref 0.57–1.00)
Glucose: 67 mg/dL — ABNORMAL LOW (ref 70–99)
Potassium: 4.4 mmol/L (ref 3.5–5.2)
Sodium: 141 mmol/L (ref 134–144)
eGFR: 67 mL/min/{1.73_m2} (ref >59)

## 2022-10-11 LAB — MICROALBUMIN / CREATININE URINE RATIO
Creatinine, Urine: 367.3 mg/dL
Microalb/Creat Ratio: 729 mg/g creat — ABNORMAL HIGH (ref 0–29)
Microalbumin, Urine: 2678.4 ug/mL

## 2022-10-26 ENCOUNTER — Other Ambulatory Visit: Payer: Self-pay | Admitting: Family Medicine

## 2022-10-26 DIAGNOSIS — F411 Generalized anxiety disorder: Secondary | ICD-10-CM

## 2022-11-03 ENCOUNTER — Other Ambulatory Visit: Payer: Self-pay

## 2022-11-03 DIAGNOSIS — E038 Other specified hypothyroidism: Secondary | ICD-10-CM

## 2022-11-03 DIAGNOSIS — I152 Hypertension secondary to endocrine disorders: Secondary | ICD-10-CM

## 2022-11-03 MED ORDER — AMLODIPINE-VALSARTAN-HCTZ 10-160-25 MG PO TABS: 1.0000 | ORAL_TABLET | Freq: Every day | ORAL | 1 refills | Status: DC

## 2022-11-03 MED ORDER — LEVOTHYROXINE SODIUM 100 MCG PO TABS: 100.0000 ug | ORAL_TABLET | Freq: Every day | ORAL | 0 refills | Status: DC

## 2022-11-18 ENCOUNTER — Telehealth: Payer: Self-pay

## 2022-11-18 ENCOUNTER — Other Ambulatory Visit (HOSPITAL_COMMUNITY): Payer: Self-pay

## 2022-11-18 DIAGNOSIS — G43009 Migraine without aura, not intractable, without status migrainosus: Secondary | ICD-10-CM

## 2022-11-18 NOTE — Telephone Encounter (Signed)
A SECOND Prior Authorization was initiated for this patients REYVOW through CoverMyMeds.   Key: [id number]

## 2022-11-18 NOTE — Telephone Encounter (Signed)
A Prior Authorization was initiated and DENIED for this patients REYVOW through CoverMyMeds.   Reason:   When attempting test claim for #8 tablets for 30 days, medication will still need PA. Providers DEA is also not recognized when submitted. If approved RX may need to be sent by a different prescriber.   Will submit PA again for 8 tablets.

## 2022-11-18 NOTE — Telephone Encounter (Signed)
Prior Auth for patients medication REYVOW approved by [employer] from 11/18/22  to 11/18/23.   Key: [id number]  COPAY $25  Dr. Rock Nephew, could RX for 8 tablets per 30 days be sent under a different provider?

## 2022-11-25 MED ORDER — LASMIDITAN SUCCINATE 100 MG PO TABS: 200.0000 mg | ORAL_TABLET | Freq: Every day | ORAL | 6 refills | Status: DC | PRN

## 2022-11-25 NOTE — Addendum Note (Signed)
Addended by: Zenia Resides on: 11/25/2022 01:01 PM   Modules accepted: Orders

## 2022-11-25 NOTE — Telephone Encounter (Signed)
Rx sent as requested.

## 2023-01-02 ENCOUNTER — Other Ambulatory Visit: Payer: Self-pay

## 2023-01-02 DIAGNOSIS — E119 Type 2 diabetes mellitus without complications: Secondary | ICD-10-CM

## 2023-01-03 MED ORDER — OZEMPIC (1 MG/DOSE) 4 MG/3ML ~~LOC~~ SOPN: 1.0000 mg | PEN_INJECTOR | SUBCUTANEOUS | 1 refills | Status: DC

## 2023-03-10 ENCOUNTER — Ambulatory Visit (INDEPENDENT_AMBULATORY_CARE_PROVIDER_SITE_OTHER): Payer: No Typology Code available for payment source | Admitting: Family Medicine

## 2023-03-10 ENCOUNTER — Encounter: Payer: Self-pay | Admitting: Family Medicine

## 2023-03-10 VITALS — BP 149/91 | HR 84 | Ht 67.0 in | Wt 177.2 lb

## 2023-03-10 DIAGNOSIS — I152 Hypertension secondary to endocrine disorders: Secondary | ICD-10-CM

## 2023-03-10 DIAGNOSIS — F411 Generalized anxiety disorder: Secondary | ICD-10-CM | POA: Diagnosis not present

## 2023-03-10 DIAGNOSIS — E119 Type 2 diabetes mellitus without complications: Secondary | ICD-10-CM | POA: Diagnosis not present

## 2023-03-10 DIAGNOSIS — I10 Essential (primary) hypertension: Secondary | ICD-10-CM | POA: Diagnosis not present

## 2023-03-10 DIAGNOSIS — E038 Other specified hypothyroidism: Secondary | ICD-10-CM | POA: Diagnosis not present

## 2023-03-10 DIAGNOSIS — E559 Vitamin D deficiency, unspecified: Secondary | ICD-10-CM | POA: Diagnosis not present

## 2023-03-10 DIAGNOSIS — M25551 Pain in right hip: Secondary | ICD-10-CM | POA: Diagnosis not present

## 2023-03-10 LAB — POCT GLYCOSYLATED HEMOGLOBIN (HGB A1C): HbA1c, POC (controlled diabetic range): 5.2 % (ref 0.0–7.0)

## 2023-03-10 MED ORDER — LEVOTHYROXINE SODIUM 100 MCG PO TABS: 100.0000 ug | ORAL_TABLET | Freq: Every day | ORAL | 0 refills | Status: DC

## 2023-03-10 MED ORDER — DAPAGLIFLOZIN PROPANEDIOL 5 MG PO TABS: 5.0000 mg | ORAL_TABLET | Freq: Every day | ORAL | 3 refills | Status: DC

## 2023-03-10 MED ORDER — AMLODIPINE-VALSARTAN-HCTZ 10-160-25 MG PO TABS: 1.0000 | ORAL_TABLET | Freq: Every day | ORAL | 1 refills | Status: DC

## 2023-03-10 MED ORDER — FLUOXETINE HCL 20 MG PO TABS: 20.0000 mg | ORAL_TABLET | Freq: Every day | ORAL | 0 refills | Status: DC

## 2023-03-10 NOTE — Progress Notes (Unsigned)
    SUBJECTIVE:   CHIEF COMPLAINT / HPI:   Diabetes   R Hip Pain About 3 months Worse when going down steps Hasn't been exercising since Feb Sometimes wakes her from sleep Worse when laying on it Rapid release Tylenol is helpful Aleve intermittently, not often  Thyroid Misses several doses per week  PERTINENT  PMH / PSH: ***  OBJECTIVE:   BP (!) 142/96   Pulse 84   Ht 5\' 7"  (1.702 m)   Wt 177 lb 3.2 oz (80.4 kg)   SpO2 100%   BMI 27.75 kg/m   ***  ASSESSMENT/PLAN:   No problem-specific Assessment & Plan notes found for this encounter.     Maury Dus, MD [facility]

## 2023-03-10 NOTE — Patient Instructions (Addendum)
It was great to see you!  Things we discussed at today's visit: - Your A1c was 5.2% today, which means your diabetes is VERY well-controlled. This is not in the diabetes or pre-diabetes range! Continue your medications without any changes.  -For your hip pain: I placed a referral to sports medicine. They should call you for an appointment.  -Your blood pressure was elevated. Please take your medication EVERY DAY. We will not make any changes today.  -Call the center for women for your IUD removal (and pap). [phone]. This is your homework before I graduate in June!!!  We are checking your thyroid level and vitamin D. I will send you a MyChart message with the results.  Take care and seek immediate care sooner if you develop any concerns.  Dr. [facility]

## 2023-03-11 DIAGNOSIS — M25551 Pain in right hip: Secondary | ICD-10-CM | POA: Insufficient documentation

## 2023-03-11 LAB — VITAMIN D 25 HYDROXY (VIT D DEFICIENCY, FRACTURES): Vit D, 25-Hydroxy: 23.3 ng/mL — ABNORMAL LOW (ref 30.0–100.0)

## 2023-03-11 LAB — TSH: TSH: 5.23 u[IU]/mL — ABNORMAL HIGH (ref 0.450–4.500)

## 2023-03-11 NOTE — Assessment & Plan Note (Signed)
TSH wnl 1 year ago. On Synthroid daily but adherence remains a challenge. Check updated TSH today

## 2023-03-11 NOTE — Assessment & Plan Note (Addendum)
BP elevated today x2. Likely due to suboptimal medication adherence. Discussed strategies for improved compliance. Continue amlodipine-valsartan-HCTZ 10-160-25mg  daily. Follow up in 1 month.

## 2023-03-11 NOTE — Assessment & Plan Note (Signed)
Present for 3 months now. Suspect greater trochanteric pain syndrome vs gluteal tendinopathy. Had surprisingly good glut medius strength on exam today. Did not have improvement with course of Aleve. Referral placed to sports med for further management.

## 2023-03-11 NOTE — Assessment & Plan Note (Signed)
On daily vit D supplement. Check vit D level today

## 2023-03-11 NOTE — Assessment & Plan Note (Signed)
Very well-controlled.  A1c 5.2% today. -Continue Farxiga 5 mg daily -Continue Ozempic 1 mg weekly -Due for eye exam -UTD on foot exam, BMP, urine microalbumin -On ARB -Next A1c 6 months

## 2023-03-13 ENCOUNTER — Other Ambulatory Visit: Payer: Self-pay | Admitting: Family Medicine

## 2023-03-13 DIAGNOSIS — E038 Other specified hypothyroidism: Secondary | ICD-10-CM

## 2023-03-22 ENCOUNTER — Encounter: Payer: Self-pay | Admitting: Sports Medicine

## 2023-03-22 ENCOUNTER — Ambulatory Visit: Payer: No Typology Code available for payment source | Admitting: Sports Medicine

## 2023-03-22 VITALS — BP 148/100 | Ht 67.5 in | Wt 177.0 lb

## 2023-03-22 DIAGNOSIS — M25551 Pain in right hip: Secondary | ICD-10-CM | POA: Diagnosis not present

## 2023-03-22 NOTE — Progress Notes (Signed)
  SHAKORA NORDQUIST - 42 y.o. female MRN [id number]  Date of birth: 1981-02-24    CHIEF COMPLAINT:   Right hip pain    SUBJECTIVE:   HPI:  Pleasant 41 year old female comes to clinic to be evaluated for right hip pain.  She has pain to the lateral aspect of the right hip for the last several months.  No inciting injury or trauma.  She describes a sharp shooting pain over the lateral aspect of the hip that does not radiate anywhere else.  It is made worse by laying on that side at night.  She does workout at the gym but denies any pulling of any muscles there.  She has tried putting some Tiger balm, IcyHot on it but has not gotten any relief.  She also occasionally tried taking ibuprofen.  No numbness or tingling down the leg.  ROS:     See HPI  PERTINENT  PMH / PSH FH / / SH:  Past Medical, Surgical, Social, and Family History Reviewed & Updated in the EMR.  Pertinent findings include:  none  OBJECTIVE: BP (!) 144/100   Ht 5' 7.5" (1.715 m)   Wt 177 lb (80.3 kg)   BMI 27.31 kg/m   Physical Exam:  Vital signs are reviewed.  GEN: Alert and oriented, NAD Pulm: Breathing unlabored PSY: normal mood, congruent affect  MSK: Right hip -no obvious deformity.  No overlying erythema.  She is tender to palpation at the greater trochanter, nontender to palpation distally along the IT band.  She has full range of motion of the hip in flexion and internal and external rotation.  5/5 strength with resisted hip flexion.  4/5 strength with resisted hip abduction.  Negative logroll test.  Negative straight leg raise.  Negative FADIR/FABER.  Neurovascular intact distally.  ASSESSMENT & PLAN:  1.  Greater trochanter pain syndrome of right lower extremity  -Reassurance was provided to the patient that this is not intra-articular in nature but instead muscular.  She would like to try to treat this conservatively with home exercise plan and oral NSAIDs and topical treatments.  I offered her a  corticosteroid injection today but she declined.  She will see how she does with her home exercises.  If no improvement she can call back and get scheduled for an injection.  Otherwise she can follow-up as needed.  Arvella Nigh, MD PGY-4, Sports Medicine Fellow [facility]  Addendum:  I was the preceptor for this visit and available for immediate consultation.  Norton Blizzard MD Marrianne Mood

## 2023-03-23 DIAGNOSIS — H52203 Unspecified astigmatism, bilateral: Secondary | ICD-10-CM | POA: Diagnosis not present

## 2023-03-23 DIAGNOSIS — E1136 Type 2 diabetes mellitus with diabetic cataract: Secondary | ICD-10-CM | POA: Diagnosis not present

## 2023-03-23 DIAGNOSIS — Z7985 Long-term (current) use of injectable non-insulin antidiabetic drugs: Secondary | ICD-10-CM | POA: Diagnosis not present

## 2023-03-23 DIAGNOSIS — Z7984 Long term (current) use of oral hypoglycemic drugs: Secondary | ICD-10-CM | POA: Diagnosis not present

## 2023-03-23 DIAGNOSIS — H5213 Myopia, bilateral: Secondary | ICD-10-CM | POA: Diagnosis not present

## 2023-03-23 DIAGNOSIS — H25813 Combined forms of age-related cataract, bilateral: Secondary | ICD-10-CM | POA: Diagnosis not present

## 2023-04-04 ENCOUNTER — Encounter: Payer: Self-pay | Admitting: Family Medicine

## 2023-04-04 LAB — HM DIABETES EYE EXAM

## 2023-04-06 ENCOUNTER — Ambulatory Visit: Payer: No Typology Code available for payment source | Admitting: Family Medicine

## 2023-04-07 ENCOUNTER — Ambulatory Visit (INDEPENDENT_AMBULATORY_CARE_PROVIDER_SITE_OTHER): Payer: No Typology Code available for payment source | Admitting: Family Medicine

## 2023-04-07 VITALS — BP 128/76 | HR 83 | Wt 175.2 lb

## 2023-04-07 DIAGNOSIS — I10 Essential (primary) hypertension: Secondary | ICD-10-CM | POA: Diagnosis not present

## 2023-04-07 NOTE — Progress Notes (Unsigned)
    SUBJECTIVE:   CHIEF COMPLAINT / HPI:   Hypertension: Patient is a 42 y.o. female who presents today for HTN follow-up.  Home medications include: amlodipine-valsartan-hctz Patient reports *** compliance.  Patient {rwdoesdoesnot:24881} check blood pressure at home.  Patient {HAS HAS ZOX:09604} had a BMP in the past 1 year.   PERTINENT  PMH / PSH: ***  OBJECTIVE:   BP 128/76   Pulse 83   Wt 175 lb 3.2 oz (79.5 kg)   SpO2 99%   BMI 27.04 kg/m   ***  ASSESSMENT/PLAN:   No problem-specific Assessment & Plan notes found for this encounter.     Maury Dus, MD [facility]

## 2023-04-07 NOTE — Patient Instructions (Signed)
It was great to see you!  Your blood pressure was normal today. Keep taking all your medications as prescribed.   Take care, Dr Anner Crete

## 2023-04-08 NOTE — Assessment & Plan Note (Signed)
BP at goal. Continue current medications (amlodipine-valsartan-HCTZ 10-160-25mg  daily). Encouraged ongoing medication compliance and patient is working hard at this.

## 2023-05-08 ENCOUNTER — Other Ambulatory Visit: Payer: Self-pay | Admitting: Family Medicine

## 2023-05-08 DIAGNOSIS — E119 Type 2 diabetes mellitus without complications: Secondary | ICD-10-CM

## 2023-05-11 ENCOUNTER — Other Ambulatory Visit: Payer: Self-pay

## 2023-05-11 ENCOUNTER — Ambulatory Visit (INDEPENDENT_AMBULATORY_CARE_PROVIDER_SITE_OTHER): Payer: No Typology Code available for payment source | Admitting: Family Medicine

## 2023-05-11 ENCOUNTER — Encounter: Payer: Self-pay | Admitting: Family Medicine

## 2023-05-11 VITALS — BP 133/101 | HR 85 | Ht 67.5 in | Wt 177.4 lb

## 2023-05-11 DIAGNOSIS — I10 Essential (primary) hypertension: Secondary | ICD-10-CM | POA: Diagnosis not present

## 2023-05-11 DIAGNOSIS — E119 Type 2 diabetes mellitus without complications: Secondary | ICD-10-CM

## 2023-05-11 DIAGNOSIS — E1122 Type 2 diabetes mellitus with diabetic chronic kidney disease: Secondary | ICD-10-CM | POA: Diagnosis not present

## 2023-05-11 DIAGNOSIS — N182 Chronic kidney disease, stage 2 (mild): Secondary | ICD-10-CM

## 2023-05-11 MED ORDER — OZEMPIC (1 MG/DOSE) 4 MG/3ML ~~LOC~~ SOPN
PEN_INJECTOR | SUBCUTANEOUS | 2 refills | Status: DC
Start: 2023-05-11 — End: 2023-10-12

## 2023-05-11 NOTE — Patient Instructions (Addendum)
I have sent refills on your Ozempic  Go to your OBGYN appointment for your pap and to discuss IUD removal.  Follow up for diabetes visit in October.  Do your best to take medications EVERY SINGLE DAY.   THANK YOU for being my patient over the past 3 years. I have really enjoyed taking care of you and will miss our visits. Continue prioritizing your health!!   -Dr Anner Crete

## 2023-05-11 NOTE — Progress Notes (Signed)
    SUBJECTIVE:   CHIEF COMPLAINT / HPI:   HTN On amlodipine-valsartan-HCTZ 10-160-25 daily. Fair medication compliance. Takes meds ~4 times weekly. Does not check BP at home.  Was previously unable to get cuff through her insurance. No HA, vision changes, chest pain, or other complaints.  Diabetes Needs refills on her Ozempic.  Otherwise no concerns today.  Wanted to say goodbye to me as her PCP.  PERTINENT  PMH / PSH: h/o Hodgkin lymphoma, hypothyroidism  OBJECTIVE:   BP (!) 133/101   Pulse 85   Ht 5' 7.5" (1.715 m)   Wt 177 lb 6.4 oz (80.5 kg)   SpO2 100%   BMI 27.37 kg/m   Gen: NAD, pleasant, able to participate in exam CV: RRR, normal S1/S2, II/VI systolic murmur Resp: Normal effort, lungs CTAB Extremities: no edema or cyanosis Skin: warm and dry, no rashes noted Neuro: alert, no obvious focal deficits Psych: Normal affect and mood  ASSESSMENT/PLAN:   Type 2 diabetes mellitus without complication, without long-term current use of insulin (HCC) Very well-controlled. Last A1c 5.2% -Continue Ozempic 1mg  weekly-- refill sent -Continue Farxiga 5mg  daily -UTD on foot/eye exams (no neuropathy, no retinopathy) -Had BMP and urine microalbumin within past year-- does have CKD II and microalbuminuria  -On ARB -Will need statin at age 64 -Next A1c October 2024  Primary hypertension Longstanding since age 42. BP above goal today, likely due to suboptimal medication adherence which has been an ongoing challenge that she is working to improve. Do not feel adding another anti-hypertensive agent would be beneficial at this time. Encouraged improved compliance.      Maury Dus, MD [facility]

## 2023-05-12 NOTE — Assessment & Plan Note (Signed)
Very well-controlled. Last A1c 5.2% -Continue Ozempic 1mg  weekly-- refill sent -Continue Farxiga 5mg  daily -UTD on foot/eye exams (no neuropathy, no retinopathy) -Had BMP and urine microalbumin within past year-- does have CKD II and microalbuminuria  -On ARB -Will need statin at age 42 -Next A1c October 2024

## 2023-05-12 NOTE — Assessment & Plan Note (Addendum)
Longstanding since age 42. BP above goal today, likely due to suboptimal medication adherence which has been an ongoing challenge that she is working to improve. Do not feel adding another anti-hypertensive agent would be beneficial at this time. Encouraged improved compliance.

## 2023-05-22 ENCOUNTER — Encounter: Payer: No Typology Code available for payment source | Admitting: Family Medicine

## 2023-10-12 ENCOUNTER — Other Ambulatory Visit: Payer: Self-pay

## 2023-10-12 DIAGNOSIS — E1122 Type 2 diabetes mellitus with diabetic chronic kidney disease: Secondary | ICD-10-CM

## 2023-10-12 MED ORDER — OZEMPIC (1 MG/DOSE) 4 MG/3ML ~~LOC~~ SOPN: PEN_INJECTOR | SUBCUTANEOUS | 2 refills | Status: DC

## 2023-10-19 ENCOUNTER — Ambulatory Visit: Payer: No Typology Code available for payment source | Admitting: Family Medicine

## 2023-10-19 ENCOUNTER — Encounter: Payer: Self-pay | Admitting: Family Medicine

## 2023-10-19 ENCOUNTER — Other Ambulatory Visit: Payer: Self-pay | Admitting: Family Medicine

## 2023-10-19 VITALS — BP 156/106 | HR 85 | Ht 67.0 in | Wt 181.2 lb

## 2023-10-19 DIAGNOSIS — E119 Type 2 diabetes mellitus without complications: Secondary | ICD-10-CM

## 2023-10-19 DIAGNOSIS — E038 Other specified hypothyroidism: Secondary | ICD-10-CM

## 2023-10-19 DIAGNOSIS — Z7985 Long-term (current) use of injectable non-insulin antidiabetic drugs: Secondary | ICD-10-CM

## 2023-10-19 DIAGNOSIS — L309 Dermatitis, unspecified: Secondary | ICD-10-CM

## 2023-10-19 DIAGNOSIS — I10 Essential (primary) hypertension: Secondary | ICD-10-CM | POA: Diagnosis not present

## 2023-10-19 LAB — POCT GLYCOSYLATED HEMOGLOBIN (HGB A1C): HbA1c, POC (controlled diabetic range): 5.2 % (ref 0.0–7.0)

## 2023-10-19 MED ORDER — TRIAMCINOLONE ACETONIDE 0.5 % EX OINT
1.0000 | TOPICAL_OINTMENT | Freq: Two times a day (BID) | CUTANEOUS | 0 refills | Status: AC
Start: 2023-10-19 — End: ?

## 2023-10-19 MED ORDER — LEVOTHYROXINE SODIUM 100 MCG PO TABS: 100.0000 ug | ORAL_TABLET | Freq: Every day | ORAL | 0 refills | Status: DC

## 2023-10-19 NOTE — Assessment & Plan Note (Signed)
Prescribed Kenalog 0.5% ointment to be applied 2 times daily the affected area.  Patient has been instructed that if her itchy patch does not resolve with use of the Kenalog ointment she should call and make a follow-up appointment to further discuss other etiologies.  Differential at this time includes psoriasis versus worsening eczema versus tinea corporis.  Less likely to be tinea corporis as she has alternating patches to either side of the neck and patches resolve completely with the use of Kenalog.

## 2023-10-19 NOTE — Assessment & Plan Note (Signed)
Well-controlled on current medication regimen.  Will follow-up with patient in April for next A1c check, at which time we will move to 33-month A1c checks as her A1c has been well-controlled for a year.

## 2023-10-19 NOTE — Progress Notes (Signed)
    SUBJECTIVE:   CHIEF COMPLAINT / HPI:   Diabetes follow up  A1c is 5.2 today in office.  Patient also has a history of hypertension, and had 2 elevated readings in the office today.  She is currently maxed out on triple therapy.  She reports compliance with her medications and states that she did take her medication today.  She works as a Production designer, theatre/television/film at [employer] and states that she is under significant stress at this time.  Denies chest pain, palpitations, shortness of breath.  Patient also reports a scaly patch on her left lateral neck which is intensely itchy.  It showed up over a period of several days and has occurred before in the past.  It has been treated successfully with Kenalog but this prescription has expired.  When the patch appears it can alternate sides and does leave areas of hyperpigmentation after healing.  It has never had a period where it only partially goes away.  PERTINENT  PMH / PSH: Hypertension, diabetes, hypothyroidism, eczema  OBJECTIVE:   BP (!) 156/106   Pulse 85   Ht 5\' 7"  (1.702 m)   Wt 181 lb 3.2 oz (82.2 kg)   BMI 28.38 kg/m   General: A&O, NAD HEENT: No sign of trauma, EOM grossly intact Neck: 1 large well-circumscribed flat scaly plaque on the flexural surface of the lateral neck on the left-hand side.  Underlying erythema noted.  No evidence of satellite lesions or vesicular patterns within the patch. Cardiac: RRR, no m/r/g Respiratory: CTAB, normal WOB, no w/c/r Extremities: NTTP, no peripheral edema.   ASSESSMENT/PLAN:   Primary hypertension Referred patient to Dr. Raymondo Band for hypertension management.  Current appointment is scheduled for Tuesday 11/26 at 2 PM.  Instructed patient to take her blood pressure twice a day between now and her visit with Dr. Raymondo Band and record them so that she can bring her record of her home blood pressure readings to her visit.  Will continue to take her currently prescribed medication until that time.  Type 2  diabetes mellitus without complication, without long-term current use of insulin (HCC) Well-controlled on current medication regimen.  Will follow-up with patient in April for next A1c check, at which time we will move to 63-month A1c checks as her A1c has been well-controlled for a year.  Eczema Prescribed Kenalog 0.5% ointment to be applied 2 times daily the affected area.  Patient has been instructed that if her itchy patch does not resolve with use of the Kenalog ointment she should call and make a follow-up appointment to further discuss other etiologies.  Differential at this time includes psoriasis versus worsening eczema versus tinea corporis.  Less likely to be tinea corporis as she has alternating patches to either side of the neck and patches resolve completely with the use of Kenalog.     Gerrit Heck, DO [facility]

## 2023-10-19 NOTE — Assessment & Plan Note (Signed)
Referred patient to Dr. Raymondo Band for hypertension management.  Current appointment is scheduled for Tuesday 11/26 at 2 PM.  Instructed patient to take her blood pressure twice a day between now and her visit with Dr. Raymondo Band and record them so that she can bring her record of her home blood pressure readings to her visit.  Will continue to take her currently prescribed medication until that time.

## 2023-10-19 NOTE — Patient Instructions (Addendum)
It was wonderful to see you today!  Today we discussed your blood pressure and diabetes as well as a patch you have on your neck.  For your diabetes you are doing wonderful.  Please continue to take your medications as prescribed and we will follow-up with you in April to recheck your A1c.  For your blood pressure it is running somewhat high today compared to in the past.  Please make sure you are taking your medications every day as prescribed.  I would like you to see our pharmacist Dr. Raymondo Band to discuss further medication management.  I have scheduled you to see him next Tuesday in our pharmacy clinic.  For the scaly patch you have on your neck I have described Kenalog ointment to be applied twice a day.  If this does not resolve the spot, please make an appointment to come back and see me so that we can do some further testing.  Please call [phone] with any questions about today's appointment.   If you need any additional refills, please call your pharmacy before calling the office.  Gerrit Heck, DO Family Medicine

## 2023-10-20 LAB — BASIC METABOLIC PANEL
BUN/Creatinine Ratio: 19 (ref 9–23)
BUN: 23 mg/dL — ABNORMAL HIGH (ref 6–20)
CO2: 22 mmol/L (ref 20–29)
Calcium: 9.4 mg/dL (ref 8.7–10.2)
Chloride: 104 mmol/L (ref 96–106)
Creatinine, Ser: 1.18 mg/dL — ABNORMAL HIGH (ref 0.57–1.00)
Glucose: 87 mg/dL (ref 70–99)
Potassium: 4.8 mmol/L (ref 3.5–5.2)
Sodium: 140 mmol/L (ref 134–144)
eGFR: 60 mL/min/{1.73_m2} (ref >59)

## 2023-10-24 ENCOUNTER — Ambulatory Visit: Payer: No Typology Code available for payment source | Admitting: Pharmacist

## 2023-11-07 ENCOUNTER — Telehealth: Payer: Self-pay | Admitting: Pharmacist

## 2023-11-07 NOTE — Telephone Encounter (Signed)
Patient contacted for follow-up of missed BP follow-up appointment.  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   Congratulated her on her birthday today.   Asked her to reschedule an appointment with our office, PCP or with me in early 2025.    Total time with patient call and documentation of interaction: 12 minutes.  F/U Phone call planned: None

## 2024-01-14 ENCOUNTER — Other Ambulatory Visit: Payer: Self-pay | Admitting: Family Medicine

## 2024-01-14 DIAGNOSIS — E038 Other specified hypothyroidism: Secondary | ICD-10-CM

## 2024-02-16 ENCOUNTER — Other Ambulatory Visit: Payer: Self-pay | Admitting: Family Medicine

## 2024-02-16 DIAGNOSIS — E1122 Type 2 diabetes mellitus with diabetic chronic kidney disease: Secondary | ICD-10-CM

## 2024-04-11 ENCOUNTER — Ambulatory Visit (INDEPENDENT_AMBULATORY_CARE_PROVIDER_SITE_OTHER): Admitting: Family Medicine

## 2024-04-11 ENCOUNTER — Encounter: Payer: Self-pay | Admitting: Family Medicine

## 2024-04-11 VITALS — BP 174/110 | HR 94 | Ht 67.0 in | Wt 186.8 lb

## 2024-04-11 DIAGNOSIS — I10 Essential (primary) hypertension: Secondary | ICD-10-CM | POA: Diagnosis not present

## 2024-04-11 DIAGNOSIS — Z975 Presence of (intrauterine) contraceptive device: Secondary | ICD-10-CM

## 2024-04-11 DIAGNOSIS — G43009 Migraine without aura, not intractable, without status migrainosus: Secondary | ICD-10-CM | POA: Diagnosis not present

## 2024-04-11 DIAGNOSIS — E119 Type 2 diabetes mellitus without complications: Secondary | ICD-10-CM | POA: Diagnosis not present

## 2024-04-11 DIAGNOSIS — E038 Other specified hypothyroidism: Secondary | ICD-10-CM

## 2024-04-11 LAB — POCT GLYCOSYLATED HEMOGLOBIN (HGB A1C): HbA1c POC (<> result, manual entry): 5.1 % (ref 4.0–5.6)

## 2024-04-11 MED ORDER — LEVOTHYROXINE SODIUM 100 MCG PO TABS: 100.0000 ug | ORAL_TABLET | Freq: Every day | ORAL | 0 refills | Status: DC

## 2024-04-11 MED ORDER — LASMIDITAN SUCCINATE 100 MG PO TABS: 200.0000 mg | ORAL_TABLET | Freq: Every day | ORAL | 6 refills | Status: AC | PRN

## 2024-04-11 NOTE — Assessment & Plan Note (Signed)
 Patient with severely elevated Bps in office today, but did not take her medications -BMET done today for electrolyte monitoring -ED precautions given -follow up in one month

## 2024-04-11 NOTE — Assessment & Plan Note (Signed)
 Refilled rescue medication today

## 2024-04-11 NOTE — Assessment & Plan Note (Signed)
 Referral to GYN and TVUS ordered

## 2024-04-11 NOTE — Assessment & Plan Note (Signed)
 Refilled levothyroxine , plan to follow up labs at next appointment due to reported poor compliance.

## 2024-04-11 NOTE — Progress Notes (Signed)
    SUBJECTIVE:   CHIEF COMPLAINT / HPI:   Diabetes follow up  Doing well overall, A1c today is 5.1  Forgot to take her blood pressure medications today. Denies chest pain, shortness of breath, blurry vision.   Headaches w/ ozempic , sounds like her migraines being triggered by ozempic . No change in prodrome or symptoms from her usual pattern, has been out of her rescue medication for a long time, just hadn't thought to refill it.   Constipated as well, sometimes has regular poops then will go for a couple days without pooping and have hard, pellet like stools.   IUD still in place, strings not found. Had referral to GYN but was unable to go to her appointment. Has been at least a year since we had a known location for her IUD. Deferred well woman exam today because her daughter and niece were with her.   PERTINENT  PMH / PSH: T2DM, HTN  OBJECTIVE:   BP (!) 174/110   Pulse 94   Ht 5\' 7"  (1.702 m)   Wt 186 lb 12.8 oz (84.7 kg)   SpO2 99%   BMI 29.26 kg/m   General: A&O, NAD Cardiac: RRR, no m/r/g Respiratory: CTAB, normal WOB, no w/c/r Extremities: NTTP, no peripheral edema.  ASSESSMENT/PLAN:   Assessment & Plan Primary hypertension Patient with severely elevated Bps in office today, but did not take her medications -BMET done today for electrolyte monitoring -ED precautions given -follow up in one month Type 2 diabetes mellitus without complication, without long-term current use of insulin  (HCC) A1c today at goal, monofilament exam completed, normal.  -urine ACR and GFR collected today Migraine without aura and without status migrainosus, not intractable Refilled rescue medication today IUD (intrauterine device) in place Referral to GYN and TVUS ordered Other specified hypothyroidism Refilled levothyroxine , plan to follow up labs at next appointment due to reported poor compliance.    Rayma Calandra, DO [facility]

## 2024-04-11 NOTE — Patient Instructions (Addendum)
 It was wonderful to see you today!  Today we discussed your blood pressure. It is very important that you take your medications every day to keep your blood pressure in a safe range.   Your A1c today was at goal, which is fantastic! The constipation you are experiencing is likely a side effect of your ozempic . You can take miralax, one cap full in a glass of water every day.   We also did some blood work and took a urine sample to check your electrolytes and kidney function. If anything is abnormal, you will get a phone call, otherwise I will send a MyChart message when I have your results.  I have also placed a referral to gynecology and ordered a transvaginal ultrasound to help you have your IUD removed. It is very important that you use alternate forms of birth control in the meantime. Ozempic  can cause major bone deformities in fetuses, so preventing pregnancy is recommended.   Please call [phone] with any questions about today's appointment.   If you need any additional refills, please call your pharmacy before calling the office.  Rayma Calandra, DO Family Medicine

## 2024-04-11 NOTE — Assessment & Plan Note (Signed)
 A1c today at goal, monofilament exam completed, normal.  -urine ACR and GFR collected today

## 2024-04-12 ENCOUNTER — Ambulatory Visit: Payer: Self-pay | Admitting: Family Medicine

## 2024-04-12 ENCOUNTER — Other Ambulatory Visit (HOSPITAL_COMMUNITY): Payer: Self-pay

## 2024-04-12 ENCOUNTER — Telehealth: Payer: Self-pay

## 2024-04-12 LAB — MICROALBUMIN / CREATININE URINE RATIO
Creatinine, Urine: 440.6 mg/dL
Microalb/Creat Ratio: 1451 mg/g{creat} — ABNORMAL HIGH (ref 0–29)
Microalbumin, Urine: 6394.3 ug/mL

## 2024-04-12 LAB — BASIC METABOLIC PANEL WITH GFR
BUN/Creatinine Ratio: 13 (ref 9–23)
BUN: 16 mg/dL (ref 6–24)
CO2: 24 mmol/L (ref 20–29)
Calcium: 8.8 mg/dL (ref 8.7–10.2)
Chloride: 105 mmol/L (ref 96–106)
Creatinine, Ser: 1.27 mg/dL — ABNORMAL HIGH (ref 0.57–1.00)
Glucose: 87 mg/dL (ref 70–99)
Potassium: 4.3 mmol/L (ref 3.5–5.2)
Sodium: 140 mmol/L (ref 134–144)
eGFR: 55 mL/min/{1.73_m2} — ABNORMAL LOW (ref >59)

## 2024-04-12 NOTE — Telephone Encounter (Signed)
 Pharmacy Patient Advocate Encounter   Received notification from CoverMyMeds that prior authorization for Reyvow  100mg  is required/requested.   Insurance verification completed.   The patient is insured through [facility] .   PA required; PA submitted to above mentioned insurance via CoverMyMeds Key/confirmation #/EOC [id number]. Status is pending

## 2024-04-12 NOTE — Telephone Encounter (Signed)
 Pharmacy Patient Advocate Encounter  Received notification from [facility] that Prior Authorization for REYVOW  100MG  has been APPROVED from 04/12/24 to 10/13/24   PA #/Case ID/Reference #: [id number]

## 2024-04-12 NOTE — Telephone Encounter (Signed)
 Called patient and after confirming name and date of birth discussed patient's results on BMET/GFR and urine microalbumin.  Given the amount of protein spillage there is evidence that her blood pressure is causing significant damage to her kidneys.  We discussed at length the need for her to be consistent with taking her blood pressure medications and that I would encourage her to drink more water to help flush her system.  Patient understood and was in agreement with the plan, we will recheck her BMET at her next appointment on June 19.

## 2024-04-19 ENCOUNTER — Ambulatory Visit (HOSPITAL_COMMUNITY)
Admission: RE | Admit: 2024-04-19 | Discharge: 2024-04-19 | Disposition: A | Discharge status: Home / Self Care | Source: Ambulatory Visit | Attending: Family Medicine | Admitting: Family Medicine

## 2024-04-19 DIAGNOSIS — Z975 Presence of (intrauterine) contraceptive device: Secondary | ICD-10-CM | POA: Insufficient documentation

## 2024-04-19 DIAGNOSIS — D251 Intramural leiomyoma of uterus: Secondary | ICD-10-CM | POA: Diagnosis not present

## 2024-05-09 ENCOUNTER — Other Ambulatory Visit: Payer: Self-pay

## 2024-05-09 DIAGNOSIS — E1122 Type 2 diabetes mellitus with diabetic chronic kidney disease: Secondary | ICD-10-CM

## 2024-05-09 MED ORDER — OZEMPIC (1 MG/DOSE) 4 MG/3ML ~~LOC~~ SOPN: PEN_INJECTOR | SUBCUTANEOUS | 2 refills | Status: AC

## 2024-05-16 ENCOUNTER — Ambulatory Visit: Payer: Self-pay | Admitting: Family Medicine

## 2024-05-16 ENCOUNTER — Encounter: Payer: Self-pay | Admitting: Family Medicine

## 2024-05-16 VITALS — BP 116/80 | HR 79 | Ht 67.0 in | Wt 181.6 lb

## 2024-05-16 DIAGNOSIS — I10 Essential (primary) hypertension: Secondary | ICD-10-CM | POA: Diagnosis not present

## 2024-05-16 DIAGNOSIS — E119 Type 2 diabetes mellitus without complications: Secondary | ICD-10-CM

## 2024-05-16 DIAGNOSIS — Z0000 Encounter for general adult medical examination without abnormal findings: Secondary | ICD-10-CM

## 2024-05-16 MED ORDER — DAPAGLIFLOZIN PROPANEDIOL 5 MG PO TABS: 5.0000 mg | ORAL_TABLET | Freq: Every day | ORAL | 3 refills | Status: AC

## 2024-05-16 MED ORDER — AMLODIPINE-VALSARTAN-HCTZ 10-160-25 MG PO TABS
1.0000 | ORAL_TABLET | Freq: Every day | ORAL | 3 refills | Status: AC
Start: 2024-05-16 — End: ?

## 2024-05-16 NOTE — Assessment & Plan Note (Signed)
-   Refill Farxiga , pharmacy for patient medication 200+ hours, patient will contact her insurance and if medication remains unaffordable we will switch to a different therapy -Continue Ozempic

## 2024-05-16 NOTE — Progress Notes (Signed)
    SUBJECTIVE:   CHIEF COMPLAINT / HPI:   HTN follow up, kidney re-eval  Patient reports improved compliance with her blood pressure medications and has started exercising more regularly.  In office today blood pressures are much better controlled.  PERTINENT  PMH / PSH: Hypertension, diabetes  OBJECTIVE:   BP 116/80   Pulse 79   Ht 5' 7 (1.702 m)   Wt 181 lb 9.6 oz (82.4 kg)   SpO2 99%   BMI 28.44 kg/m   General: Well-appearing female, no distress Cardiac: RRR, no M/R/G Respiratory: CTAB, no increased work of breathing Remedies: Results for spontaneously and appropriately no evidence of edema.  ASSESSMENT/PLAN:   Assessment & Plan Hypertension, unspecified type - At goal today, continue amlodipine -valsartan -hydrochlorothiazide  - Recheck BMET Type 2 diabetes mellitus without complication, without long-term current use of insulin  (HCC) - Refill Farxiga , pharmacy for patient medication 200+ hours, patient will contact her insurance and if medication remains unaffordable we will switch to a different therapy -Continue Ozempic  Healthcare maintenance - First mammogram ordered - Scheduled appointment for Pap smear in 1 month   Rayma Calandra, DO [facility]

## 2024-05-16 NOTE — Patient Instructions (Signed)
 It was wonderful to see you today!  Your blood pressure today was much better! Keep up the good work with exercising and taking your medications daily. As discussed, we will recheck your kidney function today to see whether we will need to do more regular monitoring of your blood sugars and blood pressure. If anything comes back abnormal, I will call you with the results.  We also scheduled your pap smear for 7/18.  Please call [phone] with any questions about today's appointment.   If you need any additional refills, please call your pharmacy before calling the office.  Rayma Calandra, DO Family Medicine

## 2024-05-17 ENCOUNTER — Ambulatory Visit: Payer: Self-pay | Admitting: Family Medicine

## 2024-05-17 DIAGNOSIS — N1831 Chronic kidney disease, stage 3a: Secondary | ICD-10-CM

## 2024-05-17 LAB — BASIC METABOLIC PANEL WITH GFR
BUN/Creatinine Ratio: 17 (ref 9–23)
BUN: 25 mg/dL — ABNORMAL HIGH (ref 6–24)
CO2: 21 mmol/L (ref 20–29)
Calcium: 9.1 mg/dL (ref 8.7–10.2)
Chloride: 105 mmol/L (ref 96–106)
Creatinine, Ser: 1.45 mg/dL — ABNORMAL HIGH (ref 0.57–1.00)
Glucose: 77 mg/dL (ref 70–99)
Potassium: 4.9 mmol/L (ref 3.5–5.2)
Sodium: 139 mmol/L (ref 134–144)
eGFR: 47 mL/min/{1.73_m2} — ABNORMAL LOW (ref >59)

## 2024-06-13 ENCOUNTER — Ambulatory Visit
Admission: RE | Admit: 2024-06-13 | Discharge: 2024-06-13 | Disposition: A | Discharge status: Home / Self Care | Source: Ambulatory Visit | Attending: Family Medicine | Admitting: Family Medicine

## 2024-06-13 DIAGNOSIS — Z0000 Encounter for general adult medical examination without abnormal findings: Secondary | ICD-10-CM

## 2024-06-13 DIAGNOSIS — Z1231 Encounter for screening mammogram for malignant neoplasm of breast: Secondary | ICD-10-CM | POA: Diagnosis not present

## 2024-06-14 ENCOUNTER — Ambulatory Visit: Payer: Self-pay | Admitting: Family Medicine

## 2024-06-14 ENCOUNTER — Other Ambulatory Visit (HOSPITAL_COMMUNITY)
Admission: RE | Admit: 2024-06-14 | Discharge: 2024-06-14 | Disposition: A | Discharge status: Home / Self Care | Source: Ambulatory Visit | Attending: Family Medicine | Admitting: Family Medicine

## 2024-06-14 ENCOUNTER — Encounter: Payer: Self-pay | Admitting: Family Medicine

## 2024-06-14 VITALS — BP 124/72 | HR 87 | Ht 67.0 in | Wt 176.0 lb

## 2024-06-14 DIAGNOSIS — Z124 Encounter for screening for malignant neoplasm of cervix: Secondary | ICD-10-CM | POA: Diagnosis not present

## 2024-06-14 DIAGNOSIS — Z30432 Encounter for removal of intrauterine contraceptive device: Secondary | ICD-10-CM | POA: Diagnosis not present

## 2024-06-14 DIAGNOSIS — Z113 Encounter for screening for infections with a predominantly sexual mode of transmission: Secondary | ICD-10-CM | POA: Diagnosis not present

## 2024-06-14 LAB — POCT WET PREP (WET MOUNT)
Clue Cells Wet Prep Whiff POC: NEGATIVE
Trichomonas Wet Prep HPF POC: ABSENT

## 2024-06-14 MED ORDER — IBUPROFEN 200 MG PO TABS
400.0000 mg | ORAL_TABLET | Freq: Once | ORAL | Status: AC
Start: 2024-06-14 — End: 2024-06-14
  Administered 2024-06-14: 400 mg via ORAL

## 2024-06-14 NOTE — Progress Notes (Signed)
    SUBJECTIVE:   CHIEF COMPLAINT / HPI:   Patient presents for routine pap with HPV. She does report some itching, and requests testing for yeast/bv etc.   Also desires IUD removal if able. Plans to use condoms moving forward for [facility]. Does not desire pregnancy.   PERTINENT  PMH / PSH: HTN, Thyroid  cancer.   OBJECTIVE:   BP 124/72   Pulse 87   Ht 5' 7 (1.702 m)   Wt 176 lb (79.8 kg)   SpO2 100%   BMI 27.57 kg/m   GU: Desiree Blount present as Biomedical engineer. Normal appearing external female genitalia, no concern for superficial skin infection. Vaginal mucosa is pink and well perfuse. Cervix is normal appearing with visible IUD strings.   IUD Removal  Patient given informed consent for IUD removal. She is aware this will stop the birth control method provided by the IUD immediately. Verbal informed consent obtained. Appropriate time out taken.  Patient placed in the lithotomy position and the cervix brought into view using speculum. The IUD strings were identified coming from the cervical os. These strings were grasped with ring forceps, and the IUD withdrawn gently from the uterus. There were no complications and no blood loss. Patient tolerated the procedure well.        ASSESSMENT/PLAN:   Assessment & Plan Encounter for screening for cervical cancer -Pap with HPV testing collected Encounter for IUD removal -IUD successfully removed -counseled on ongoing contraception -400mg  Ibuprofen  given for cramping/pain after procedure   Lucie Pinal, DO [facility]

## 2024-06-14 NOTE — Patient Instructions (Signed)
 It was wonderful to see you today!  Today we completed your routine Pap smear, and collected a couple of other swabs to test for infections.  If any of your results come back abnormal I will give you a phone call to discuss next steps, otherwise you will receive a message through MyChart when your results are available.  We were also able to successfully remove your IUD today.  To prevent pregnancy please use a condom moving forward until you decide how you would like to prevent pregnancy in the future.  If you have questions or would like to schedule an appointment to discuss further I am happy to do so.  After IUD extraction it is common to have some cramping and spotting.  You can treat these as you would for a normal menstrual period.  If you experience heavy bleeding or intense abdominal pain please go to the emergency department for further evaluation.  Please call [phone] with any questions about today's appointment.   If you need any additional refills, please call your pharmacy before calling the office.  Lucie Pinal, DO Family Medicine

## 2024-06-20 LAB — CYTOLOGY - PAP
Chlamydia: NEGATIVE
Comment: NEGATIVE
Comment: NEGATIVE
Comment: NORMAL
Diagnosis: NEGATIVE
High risk HPV: NEGATIVE
Neisseria Gonorrhea: NEGATIVE

## 2024-06-21 ENCOUNTER — Ambulatory Visit: Payer: Self-pay | Admitting: Family Medicine

## 2024-06-25 ENCOUNTER — Ambulatory Visit: Payer: Self-pay | Admitting: Family Medicine

## 2024-06-25 NOTE — Progress Notes (Deleted)
    SUBJECTIVE:   CHIEF COMPLAINT / HPI:   ***  PERTINENT  PMH / PSH: ***  OBJECTIVE:   There were no vitals taken for this visit.  ***  ASSESSMENT/PLAN:   Assessment & Plan      Lucie Pinal, DO [facility]

## 2024-07-11 ENCOUNTER — Other Ambulatory Visit: Payer: Self-pay | Admitting: Family Medicine

## 2024-07-11 DIAGNOSIS — E038 Other specified hypothyroidism: Secondary | ICD-10-CM

## 2024-07-16 ENCOUNTER — Encounter: Payer: Self-pay | Admitting: Family Medicine

## 2024-07-16 ENCOUNTER — Ambulatory Visit: Admitting: Family Medicine

## 2024-07-16 VITALS — BP 124/87 | HR 80 | Ht 67.0 in | Wt 176.2 lb

## 2024-07-16 DIAGNOSIS — I10 Essential (primary) hypertension: Secondary | ICD-10-CM

## 2024-07-16 DIAGNOSIS — M25551 Pain in right hip: Secondary | ICD-10-CM | POA: Diagnosis not present

## 2024-07-16 MED ORDER — PREDNISONE 20 MG PO TABS: 40.0000 mg | ORAL_TABLET | Freq: Every day | ORAL | 0 refills | Status: AC

## 2024-07-16 MED ORDER — NAPROXEN 500 MG PO TABS: 500.0000 mg | ORAL_TABLET | Freq: Two times a day (BID) | ORAL | 0 refills | Status: DC

## 2024-07-16 NOTE — Progress Notes (Signed)
    SUBJECTIVE:   CHIEF COMPLAINT / HPI:   Patient presents today with R hip and low back pain, intermittently for 3-4 weeks. She has tried tylenol /ibuprofen , as well as heat with some improvement. No tingling or numbness, no loss of sensation or weakness.  PERTINENT  PMH / PSH: Hypertension  OBJECTIVE:   BP 124/87   Pulse 80   Ht 5' 7 (1.702 m)   Wt 176 lb 3.2 oz (79.9 kg)   LMP  (LMP Unknown)   SpO2 98%   BMI 27.60 kg/m   General: A&O, NAD Cardiac: RRR, no m/r/g Respiratory: CTAB, normal WOB, no w/c/r Extremities: NTTP, no peripheral edema. MSK: Full active and passive range of motion in the bilateral hips knees and ankles.  Pain is exacerbated by flexion of the spine and left sidebending with some exacerbation in extension as well.  Left sided posteriorly rotated iliac bone.  No sacral dysfunction is detected.  FABER test negative for left and right hips, FADIR test is positive on the right.  Negative straight leg raise bilaterally  ASSESSMENT/PLAN:   Assessment & Plan Right hip pain -MET in office today with 90% improvement of L iliac dysfunction -5 day prednisone  burst to improve inflammation -provided stretches/strengthening exercises to be done for 2 weeks -follow up in 2 weeks Primary hypertension -repeat BMP today -suspect this may represent new diagnosis of CKD 3b, but will confirm.    Lucie Pinal, DO [facility]

## 2024-07-16 NOTE — Patient Instructions (Signed)
 It was wonderful to see you today!  For your hip pain, I have prescribed you a short, five day course of prednisone . This will be two pills in the morning, with food. After this you can use tylenol  for breakthrough pain. I will also provide you with a handout of targeted exercises/stretches to complete to help as well. We will follow up in two weeks to see if you are doing any better at that time.   For your kidneys, I have ordered a repeat BMP. [facility] to have this drawn.   Please call [phone] with any questions about today's appointment.   If you need any additional refills, please call your pharmacy before calling the office.  Lucie Pinal, DO Family Medicine

## 2024-07-16 NOTE — Assessment & Plan Note (Signed)
-  MET in office today with 90% improvement of L iliac dysfunction -5 day prednisone  burst to improve inflammation -provided stretches/strengthening exercises to be done for 2 weeks -follow up in 2 weeks

## 2024-07-16 NOTE — Assessment & Plan Note (Signed)
-  repeat BMP today -suspect this may represent new diagnosis of CKD 3b, but will confirm.

## 2024-07-17 LAB — BASIC METABOLIC PANEL WITH GFR
BUN/Creatinine Ratio: 17 (ref 9–23)
BUN: 22 mg/dL (ref 6–24)
CO2: 21 mmol/L (ref 20–29)
Calcium: 9.1 mg/dL (ref 8.7–10.2)
Chloride: 103 mmol/L (ref 96–106)
Creatinine, Ser: 1.26 mg/dL — ABNORMAL HIGH (ref 0.57–1.00)
Glucose: 77 mg/dL (ref 70–99)
Potassium: 5 mmol/L (ref 3.5–5.2)
Sodium: 137 mmol/L (ref 134–144)
eGFR: 55 mL/min/1.73 — ABNORMAL LOW (ref >59)

## 2024-07-18 ENCOUNTER — Ambulatory Visit: Payer: Self-pay | Admitting: Family Medicine

## 2024-10-11 ENCOUNTER — Other Ambulatory Visit: Payer: Self-pay | Admitting: Family Medicine

## 2024-10-11 DIAGNOSIS — E038 Other specified hypothyroidism: Secondary | ICD-10-CM
